# Patient Record
Sex: Male | Born: 1945
Health system: Southern US, Community
[De-identification: ages and names within clinical notes are randomized; demographics above are authoritative.]

## PROBLEM LIST (undated history)

## (undated) DIAGNOSIS — J45909 Unspecified asthma, uncomplicated: Secondary | ICD-10-CM

## (undated) DIAGNOSIS — Z972 Presence of dental prosthetic device (complete) (partial): Secondary | ICD-10-CM

## (undated) DIAGNOSIS — Z7901 Long term (current) use of anticoagulants: Secondary | ICD-10-CM

## (undated) DIAGNOSIS — I10 Essential (primary) hypertension: Secondary | ICD-10-CM

## (undated) DIAGNOSIS — K08109 Complete loss of teeth, unspecified cause, unspecified class: Secondary | ICD-10-CM

## (undated) DIAGNOSIS — H16231 Neurotrophic keratoconjunctivitis, right eye: Secondary | ICD-10-CM

## (undated) DIAGNOSIS — I2510 Atherosclerotic heart disease of native coronary artery without angina pectoris: Secondary | ICD-10-CM

## (undated) DIAGNOSIS — M5416 Radiculopathy, lumbar region: Secondary | ICD-10-CM

## (undated) DIAGNOSIS — Z973 Presence of spectacles and contact lenses: Secondary | ICD-10-CM

## (undated) DIAGNOSIS — G8929 Other chronic pain: Secondary | ICD-10-CM

## (undated) DIAGNOSIS — N182 Chronic kidney disease, stage 2 (mild): Secondary | ICD-10-CM

## (undated) DIAGNOSIS — H401113 Primary open-angle glaucoma, right eye, severe stage: Secondary | ICD-10-CM

## (undated) DIAGNOSIS — N400 Enlarged prostate without lower urinary tract symptoms: Secondary | ICD-10-CM

## (undated) DIAGNOSIS — J4489 Other specified chronic obstructive pulmonary disease: Secondary | ICD-10-CM

## (undated) DIAGNOSIS — J189 Pneumonia, unspecified organism: Secondary | ICD-10-CM

## (undated) DIAGNOSIS — C61 Malignant neoplasm of prostate: Secondary | ICD-10-CM

## (undated) DIAGNOSIS — M1990 Unspecified osteoarthritis, unspecified site: Secondary | ICD-10-CM

## (undated) DIAGNOSIS — I440 Atrioventricular block, first degree: Secondary | ICD-10-CM

## (undated) DIAGNOSIS — H40022 Open angle with borderline findings, high risk, left eye: Secondary | ICD-10-CM

## (undated) HISTORY — DX: Open angle with borderline findings, high risk, left eye: H40.022

## (undated) HISTORY — PX: EYE SURGERY: SHX253

## (undated) HISTORY — DX: Benign prostatic hyperplasia without lower urinary tract symptoms: N40.0

## (undated) HISTORY — PX: CATARACT EXTRACTION W/ INTRAOCULAR LENS  IMPLANT, BILATERAL: SHX1307

## (undated) HISTORY — PX: CORNEAL TRANSPLANT: SHX108

## (undated) HISTORY — DX: Primary open-angle glaucoma, right eye, severe stage: H40.1113

## (undated) HISTORY — DX: Radiculopathy, lumbar region: M54.16

---

## 1999-05-29 DIAGNOSIS — N400 Enlarged prostate without lower urinary tract symptoms: Secondary | ICD-10-CM

## 1999-05-29 HISTORY — DX: Benign prostatic hyperplasia without lower urinary tract symptoms: N40.0

## 2012-07-15 DIAGNOSIS — T868419 Corneal transplant failure, unspecified eye: Secondary | ICD-10-CM | POA: Insufficient documentation

## 2012-07-29 DIAGNOSIS — Z961 Presence of intraocular lens: Secondary | ICD-10-CM | POA: Insufficient documentation

## 2012-11-04 HISTORY — PX: CORNEAL TRANSPLANT: SHX108

## 2012-12-30 DIAGNOSIS — M5416 Radiculopathy, lumbar region: Secondary | ICD-10-CM | POA: Insufficient documentation

## 2013-02-10 DIAGNOSIS — H2640 Unspecified secondary cataract: Secondary | ICD-10-CM | POA: Insufficient documentation

## 2013-03-13 ENCOUNTER — Emergency Department (HOSPITAL_COMMUNITY)
Admission: EM | Admit: 2013-03-13 | Discharge: 2013-03-13 | Disposition: A | Discharge status: Home / Self Care | Payer: Self-pay | Attending: Emergency Medicine | Admitting: Emergency Medicine

## 2013-03-13 ENCOUNTER — Emergency Department (HOSPITAL_COMMUNITY): Payer: Self-pay

## 2013-03-13 ENCOUNTER — Encounter (HOSPITAL_COMMUNITY): Payer: Self-pay | Admitting: Emergency Medicine

## 2013-03-13 DIAGNOSIS — Z792 Long term (current) use of antibiotics: Secondary | ICD-10-CM | POA: Insufficient documentation

## 2013-03-13 DIAGNOSIS — IMO0002 Reserved for concepts with insufficient information to code with codable children: Secondary | ICD-10-CM | POA: Insufficient documentation

## 2013-03-13 DIAGNOSIS — Z79899 Other long term (current) drug therapy: Secondary | ICD-10-CM | POA: Insufficient documentation

## 2013-03-13 DIAGNOSIS — J441 Chronic obstructive pulmonary disease with (acute) exacerbation: Secondary | ICD-10-CM | POA: Insufficient documentation

## 2013-03-13 HISTORY — DX: Unspecified asthma, uncomplicated: J45.909

## 2013-03-13 MED ORDER — PREDNISONE 20 MG PO TABS: 60.0000 mg | ORAL_TABLET | Freq: Every day | ORAL | Status: DC

## 2013-03-13 MED ORDER — IPRATROPIUM BROMIDE 0.02 % IN SOLN
0.5000 mg | Freq: Once | RESPIRATORY_TRACT | Status: AC
  Administered 2013-03-13: 0.5 mg via RESPIRATORY_TRACT
  Filled 2013-03-13: qty 2.5

## 2013-03-13 MED ORDER — PREDNISONE 20 MG PO TABS
60.0000 mg | ORAL_TABLET | Freq: Once | ORAL | Status: AC
  Administered 2013-03-13: 60 mg via ORAL
  Filled 2013-03-13: qty 3

## 2013-03-13 MED ORDER — ALBUTEROL SULFATE (5 MG/ML) 0.5% IN NEBU
2.5000 mg | INHALATION_SOLUTION | Freq: Once | RESPIRATORY_TRACT | Status: AC
  Administered 2013-03-13: 2.5 mg via RESPIRATORY_TRACT
  Filled 2013-03-13: qty 0.5

## 2013-03-13 NOTE — ED Provider Notes (Signed)
CSN: [id number]     Arrival date & time 03/13/13  1413 History   First MD Initiated Contact with Patient 03/13/13 1502     Chief Complaint  Patient presents with  . Asthma   (Consider location/radiation/quality/duration/timing/severity/associated sxs/prior Treatment) HPI Comments: Patient here with a 2 day history of worsening shortness of breath and cough.  He denies fever or chills, chest pain.  States the shortness of breath is worse with exertion.  He denies any recent sick contacts and has not been out of the [place].  He is originally from [place].  He states that he has thick brown colored sputum without frank hemoptysis as well as wheezing.  He has tried his home ventolin inhaler without relief of symptoms.  Also denies lower extremity pain or edema.    Patient is a 67 y.o. male presenting with asthma. The history is provided by the patient and a relative. No language interpreter was used.  Asthma This is a new problem. The current episode started yesterday. The problem occurs constantly. The problem has been unchanged. Associated symptoms include coughing. Pertinent negatives include no abdominal pain, arthralgias, chest pain, chills, congestion, diaphoresis, fever, headaches, myalgias, neck pain, sore throat, swollen glands, vertigo, vomiting or weakness. The symptoms are aggravated by walking. He has tried nothing for the symptoms. The treatment provided no relief.    Past Medical History  Diagnosis Date  . Asthma    History reviewed. No pertinent past surgical history. No family history on file. History  Substance Use Topics  . Smoking status: Never Smoker   . Smokeless tobacco: Never Used  . Alcohol Use: No    Review of Systems  Constitutional: Negative for fever, chills and diaphoresis.  HENT: Negative for congestion and sore throat.   Respiratory: Positive for cough.   Cardiovascular: Negative for chest pain.  Gastrointestinal: Negative for vomiting and abdominal pain.   Musculoskeletal: Negative for arthralgias, myalgias and neck pain.  Neurological: Negative for vertigo, weakness and headaches.  All other systems reviewed and are negative.    Allergies  Review of patient's allergies indicates no known allergies.  Home Medications   Current Outpatient Rx  Name  Route  Sig  Dispense  Refill  . albuterol (PROVENTIL HFA) 108 (90 BASE) MCG/ACT inhaler   Inhalation   Inhale 2 puffs into the lungs every 6 (six) hours as needed for wheezing or shortness of breath.         Marland Kitchen amLODipine (NORVASC) 5 MG tablet   Oral   Take 5 mg by mouth daily.         Marland Kitchen amoxicillin (AMOXIL) 500 MG tablet   Oral   Take 500 mg by mouth daily.         . beclomethasone (QVAR) 80 MCG/ACT inhaler   Inhalation   Inhale 2 puffs into the lungs 2 (two) times daily.         . dorzolamide (TRUSOPT) 2 % ophthalmic solution   Right Eye   Place 1 drop into the right eye 3 (three) times daily.         Marland Kitchen doxazosin (CARDURA) 2 MG tablet   Oral   Take 2 mg by mouth at bedtime.         Marland Kitchen latanoprost (XALATAN) 0.005 % ophthalmic solution   Right Eye   Place 1 drop into the right eye at bedtime.         Marland Kitchen omeprazole (PRILOSEC) 20 MG capsule   Oral   Take  20 mg by mouth 2 (two) times daily.         . prednisoLONE acetate (PRED FORTE) 1 % ophthalmic suspension   Right Eye   Place 1 drop into the right eye 2 (two) times daily.         . timolol (BETIMOL) 0.5 % ophthalmic solution   Both Eyes   Place 1 drop into both eyes 2 (two) times daily.          BP 170/91  Pulse 72  Temp(Src) 98.5 F (36.9 C) (Oral)  Resp 18  SpO2 98% Physical Exam  Nursing note and vitals reviewed. Constitutional: He is oriented to person, place, and time. He appears well-developed and well-nourished. No distress.  HENT:  Head: Normocephalic and atraumatic.  Right Ear: External ear normal.  Left Ear: External ear normal.  Nose: Nose normal.  Mouth/Throat: Oropharynx is  clear and moist.  Eyes: Pupils are equal, round, and reactive to light. No scleral icterus.  Arcus senilis  Neck: Normal range of motion. Neck supple. No JVD present. No tracheal deviation present.  Cardiovascular: Normal rate, regular rhythm, normal heart sounds and intact distal pulses.  Exam reveals no gallop and no friction rub.   No murmur heard. Pulmonary/Chest: Effort normal. No respiratory distress. He has wheezes in the right middle field, the right lower field, the left middle field and the left lower field. He has no rales. He exhibits no tenderness.  Abdominal: Soft. Bowel sounds are normal. He exhibits no distension. There is no tenderness.  Musculoskeletal: Normal range of motion. He exhibits no edema and no tenderness.  Neurological: He is alert and oriented to person, place, and time. No cranial nerve deficit. He exhibits normal muscle tone. Coordination normal.  Skin: Skin is warm and dry. No rash noted. No erythema. No pallor.  Psychiatric: He has a normal mood and affect. His behavior is normal. Judgment and thought content normal.    ED Course  Procedures (including critical care time) Labs Review Labs Reviewed - No data to display Imaging Review No results found.  EKG Interpretation   None      No results found for this or any previous visit. Dg Chest 2 View  03/13/2013   CLINICAL DATA:  Wheezing and shortness of breath. History of asthma.  EXAM: CHEST  2 VIEW  COMPARISON:  None.  FINDINGS: The heart size and mediastinal contours are within normal limits. The lungs are hyperinflated but clear. No acute osseous abnormality.  IMPRESSION: Emphysema.  No acute abnormality.   Electronically Signed   By: Geanie Cooley M.D.   On: 03/13/2013 16:22    4:46 PM Patient reports marked improvement in shortness of breath after breathing treatment.  He states that he feels like he can return home.  Dr. Roselyn Bering in to see the patient.   MDM  COPD exacerbation  Patient here  with mild exacerbation of COPD.  Chest x-ray without acute infectious process noted.  Is already on Qvar and albuterol rescue inhalers.  Will continue on oral steroids for short course and patient to follow up with PCP.  He was given strict return precautions.    Izola Price Marisue Humble, PA-C 03/13/13 1652

## 2013-03-13 NOTE — ED Notes (Signed)
Patient states that he has been coughing up brown tinged sputum

## 2013-03-13 NOTE — ED Provider Notes (Signed)
Medical screening examination/treatment/procedure(s) were conducted as a shared visit with non-physician practitioner(s) and myself.  I personally evaluated the patient during the encounter  Patient presented to the emergency room with complaints of an asthma attack. He does have a known history of asthma and hand wheezing on exam. Patient improved after treatment with beta agonists and steroids.  On my exam he was no longer wheezing and was ready for discharge. Patient had no questions or concerns  Celene Kras, MD 03/13/13 [phone]

## 2013-03-13 NOTE — ED Notes (Signed)
Patient having symptoms of asthma attack. SOB and wheezing. Has taken ventolin with positive results for short amount of time.

## 2013-03-21 ENCOUNTER — Encounter (HOSPITAL_COMMUNITY): Payer: Self-pay | Admitting: Emergency Medicine

## 2013-03-21 ENCOUNTER — Other Ambulatory Visit: Payer: Self-pay

## 2013-03-21 ENCOUNTER — Emergency Department (HOSPITAL_COMMUNITY)
Admission: EM | Admit: 2013-03-21 | Discharge: 2013-03-21 | Disposition: A | Discharge status: Home / Self Care | Payer: Self-pay | Attending: Emergency Medicine | Admitting: Emergency Medicine

## 2013-03-21 ENCOUNTER — Emergency Department (HOSPITAL_COMMUNITY): Payer: Self-pay

## 2013-03-21 DIAGNOSIS — J45909 Unspecified asthma, uncomplicated: Secondary | ICD-10-CM

## 2013-03-21 DIAGNOSIS — J45901 Unspecified asthma with (acute) exacerbation: Secondary | ICD-10-CM | POA: Insufficient documentation

## 2013-03-21 DIAGNOSIS — Z792 Long term (current) use of antibiotics: Secondary | ICD-10-CM | POA: Insufficient documentation

## 2013-03-21 DIAGNOSIS — Z79899 Other long term (current) drug therapy: Secondary | ICD-10-CM | POA: Insufficient documentation

## 2013-03-21 LAB — CBC WITH DIFFERENTIAL/PLATELET
Basophils Absolute: 0 10*3/uL (ref 0.0–0.1)
Basophils Relative: 0 % (ref 0–1)
Eosinophils Absolute: 0.1 10*3/uL (ref 0.0–0.7)
Eosinophils Relative: 2 % (ref 0–5)
HCT: 44.1 % (ref 39.0–52.0)
Hemoglobin: 14.7 g/dL (ref 13.0–17.0)
Lymphocytes Relative: 22 % (ref 12–46)
Lymphs Abs: 1.5 10*3/uL (ref 0.7–4.0)
MCH: 28.7 pg (ref 26.0–34.0)
MCHC: 33.3 g/dL (ref 30.0–36.0)
MCV: 86 fL (ref 78.0–100.0)
Monocytes Absolute: 0.6 10*3/uL (ref 0.1–1.0)
Monocytes Relative: 8 % (ref 3–12)
Neutro Abs: 4.8 10*3/uL (ref 1.7–7.7)
Neutrophils Relative %: 68 % (ref 43–77)
Platelets: 235 10*3/uL (ref 150–400)
RBC: 5.13 MIL/uL (ref 4.22–5.81)
RDW: 13.1 % (ref 11.5–15.5)
WBC: 7.1 10*3/uL (ref 4.0–10.5)

## 2013-03-21 LAB — D-DIMER, QUANTITATIVE (NOT AT ARMC): D-Dimer, Quant: <0.27 ug/mL-FEU (ref 0.00–0.48)

## 2013-03-21 LAB — BASIC METABOLIC PANEL
BUN: 21 mg/dL (ref 6–23)
CO2: 29 mEq/L (ref 19–32)
Calcium: 8.9 mg/dL (ref 8.4–10.5)
Chloride: 96 mEq/L (ref 96–112)
Creatinine, Ser: 1.12 mg/dL (ref 0.50–1.35)
GFR calc Af Amer: 76 mL/min — ABNORMAL LOW (ref >90)
GFR calc non Af Amer: 65 mL/min — ABNORMAL LOW (ref >90)
Glucose, Bld: 118 mg/dL — ABNORMAL HIGH (ref 70–99)
Potassium: 3.7 mEq/L (ref 3.5–5.1)
Sodium: 133 mEq/L — ABNORMAL LOW (ref 135–145)

## 2013-03-21 LAB — TROPONIN I: Troponin I: <0.3 ng/mL (ref <0.30)

## 2013-03-21 MED ORDER — SODIUM CHLORIDE 0.9 % IV SOLN
INTRAVENOUS | Status: DC
  Administered 2013-03-21 (×2): via INTRAVENOUS

## 2013-03-21 MED ORDER — PREDNISONE 10 MG PO TABS: 20.0000 mg | ORAL_TABLET | Freq: Every day | ORAL | Status: DC

## 2013-03-21 MED ORDER — IPRATROPIUM BROMIDE 0.02 % IN SOLN
RESPIRATORY_TRACT | Status: AC
  Filled 2013-03-21: qty 2.5

## 2013-03-21 MED ORDER — AZITHROMYCIN 250 MG PO TABS: ORAL_TABLET | ORAL | Status: DC

## 2013-03-21 MED ORDER — METHYLPREDNISOLONE SODIUM SUCC 125 MG IJ SOLR
125.0000 mg | Freq: Once | INTRAMUSCULAR | Status: AC
  Administered 2013-03-21: 125 mg via INTRAVENOUS
  Filled 2013-03-21: qty 2

## 2013-03-21 MED ORDER — IPRATROPIUM BROMIDE 0.02 % IN SOLN
0.5000 mg | Freq: Once | RESPIRATORY_TRACT | Status: AC
  Administered 2013-03-21: 0.5 mg via RESPIRATORY_TRACT
  Filled 2013-03-21: qty 2.5

## 2013-03-21 MED ORDER — ALBUTEROL SULFATE (5 MG/ML) 0.5% IN NEBU
5.0000 mg | INHALATION_SOLUTION | Freq: Once | RESPIRATORY_TRACT | Status: AC
  Administered 2013-03-21: 5 mg via RESPIRATORY_TRACT
  Filled 2013-03-21: qty 1

## 2013-03-21 NOTE — ED Notes (Addendum)
Pt here for c/o sob x1 wk hx of asthma sob unrelieived by meds such inhalers Prednisone and Mucinex given on Oct 17 pox 100% no distress noted pt was seen here on Oct 17 for same

## 2013-03-21 NOTE — ED Provider Notes (Signed)
CSN: [id number]     Arrival date & time 03/21/13  1509 History   First MD Initiated Contact with Patient 03/21/13 1536     No chief complaint on file.  (Consider location/radiation/quality/duration/timing/severity/associated sxs/prior Treatment) The history is provided by the patient and a relative.   Plain or shortness of breath x3 days. Seen here recently and diagnosed with bronchitis and placed on prednisone and given albuterol. He does have history of asthma. Notes worsening productive cough of brown sputum. Denies any fever. Denies any anginal type chest pain. No vomiting or diarrhea. No orthopnea. Some dyspnea on exertion. Denies any leg pain or swelling. Symptoms are worse at night and no state he has been compliant with his medications. Past Medical History  Diagnosis Date  . Asthma    No past surgical history on file. No family history on file. History  Substance Use Topics  . Smoking status: Never Smoker   . Smokeless tobacco: Never Used  . Alcohol Use: No    Review of Systems  All other systems reviewed and are negative.    Allergies  Review of patient's allergies indicates no known allergies.  Home Medications   Current Outpatient Rx  Name  Route  Sig  Dispense  Refill  . albuterol (PROVENTIL HFA) 108 (90 BASE) MCG/ACT inhaler   Inhalation   Inhale 2 puffs into the lungs every 6 (six) hours as needed for wheezing or shortness of breath.         Marland Kitchen amLODipine (NORVASC) 5 MG tablet   Oral   Take 5 mg by mouth daily.         . beclomethasone (QVAR) 80 MCG/ACT inhaler   Inhalation   Inhale 2 puffs into the lungs 2 (two) times daily.         . dorzolamide (TRUSOPT) 2 % ophthalmic solution   Right Eye   Place 1 drop into the right eye 3 (three) times daily.         Marland Kitchen doxazosin (CARDURA) 2 MG tablet   Oral   Take 2 mg by mouth at bedtime.         Marland Kitchen latanoprost (XALATAN) 0.005 % ophthalmic solution   Right Eye   Place 1 drop into the right eye at  bedtime.         Marland Kitchen omeprazole (PRILOSEC) 20 MG capsule   Oral   Take 20 mg by mouth 2 (two) times daily.         . prednisoLONE acetate (PRED FORTE) 1 % ophthalmic suspension   Right Eye   Place 1 drop into the right eye 2 (two) times daily.         . timolol (BETIMOL) 0.5 % ophthalmic solution   Both Eyes   Place 1 drop into both eyes 2 (two) times daily.         Marland Kitchen amoxicillin (AMOXIL) 500 MG tablet   Oral   Take 500 mg by mouth daily.          BP 149/82  Pulse 79  Temp(Src) 98.5 F (36.9 C) (Oral)  Resp 16  SpO2 100% Physical Exam  Nursing note and vitals reviewed. Constitutional: He is oriented to person, place, and time. He appears well-developed and well-nourished.  Non-toxic appearance. No distress.  HENT:  Head: Normocephalic and atraumatic.  Eyes: Conjunctivae, EOM and lids are normal. Pupils are equal, round, and reactive to light.  Neck: Normal range of motion. Neck supple. No tracheal deviation present. No mass  present.  Cardiovascular: Normal rate, regular rhythm and normal heart sounds.  Exam reveals no gallop.   No murmur heard. Pulmonary/Chest: Effort normal. No stridor. No respiratory distress. He has decreased breath sounds. He has wheezes. He has no rhonchi. He has no rales.  Abdominal: Soft. Normal appearance and bowel sounds are normal. He exhibits no distension. There is no tenderness. There is no rebound and no CVA tenderness.  Musculoskeletal: Normal range of motion. He exhibits no edema and no tenderness.  Neurological: He is alert and oriented to person, place, and time. He has normal strength. No cranial nerve deficit or sensory deficit. GCS eye subscore is 4. GCS verbal subscore is 5. GCS motor subscore is 6.  Skin: Skin is warm and dry. No abrasion and no rash noted.  Psychiatric: He has a normal mood and affect. His speech is normal and behavior is normal.    ED Course  Procedures (including critical care time) Labs Review Labs  Reviewed - No data to display Imaging Review No results found.  EKG Interpretation   None       MDM  No diagnosis found.  Date: 03/21/2013  Rate: 77  Rhythm: normal sinus rhythm  QRS Axis: normal  Intervals: normal  ST/T Wave abnormalities: nonspecific T wave changes  Conduction Disutrbances:left anterior fascicular block  Narrative Interpretation:   Old EKG Reviewed: none available  5:59 PM Patient given albuterol & Medrol here feels better. No concern for ACS or pulmonary embolism. Suspect that he likely has a asthma exacerbation we'll treat accordingly    Toy Baker, MD 03/21/13 1801

## 2013-05-26 ENCOUNTER — Emergency Department (HOSPITAL_COMMUNITY): Payer: Self-pay

## 2013-05-26 ENCOUNTER — Encounter (HOSPITAL_COMMUNITY): Payer: Self-pay | Admitting: Emergency Medicine

## 2013-05-26 ENCOUNTER — Emergency Department (HOSPITAL_COMMUNITY)
Admission: EM | Admit: 2013-05-26 | Discharge: 2013-05-26 | Disposition: A | Discharge status: Home / Self Care | Payer: Self-pay | Attending: Emergency Medicine | Admitting: Emergency Medicine

## 2013-05-26 DIAGNOSIS — J45901 Unspecified asthma with (acute) exacerbation: Secondary | ICD-10-CM | POA: Insufficient documentation

## 2013-05-26 DIAGNOSIS — IMO0002 Reserved for concepts with insufficient information to code with codable children: Secondary | ICD-10-CM | POA: Insufficient documentation

## 2013-05-26 DIAGNOSIS — R05 Cough: Secondary | ICD-10-CM

## 2013-05-26 DIAGNOSIS — Z79899 Other long term (current) drug therapy: Secondary | ICD-10-CM | POA: Insufficient documentation

## 2013-05-26 DIAGNOSIS — R059 Cough, unspecified: Secondary | ICD-10-CM

## 2013-05-26 LAB — BASIC METABOLIC PANEL
BUN: 14 mg/dL (ref 6–23)
CO2: 27 mEq/L (ref 19–32)
Calcium: 9.2 mg/dL (ref 8.4–10.5)
Chloride: 100 mEq/L (ref 96–112)
Creatinine, Ser: 1 mg/dL (ref 0.50–1.35)
GFR calc Af Amer: 87 mL/min — ABNORMAL LOW (ref >90)
GFR calc non Af Amer: 75 mL/min — ABNORMAL LOW (ref >90)
Glucose, Bld: 93 mg/dL (ref 70–99)
Potassium: 4.1 mEq/L (ref 3.7–5.3)
Sodium: 138 mEq/L (ref 137–147)

## 2013-05-26 LAB — CBC
HCT: 43.8 % (ref 39.0–52.0)
Hemoglobin: 14.6 g/dL (ref 13.0–17.0)
MCH: 28.2 pg (ref 26.0–34.0)
MCHC: 33.3 g/dL (ref 30.0–36.0)
MCV: 84.7 fL (ref 78.0–100.0)
Platelets: 214 10*3/uL (ref 150–400)
RBC: 5.17 MIL/uL (ref 4.22–5.81)
RDW: 12.9 % (ref 11.5–15.5)
WBC: 7.7 10*3/uL (ref 4.0–10.5)

## 2013-05-26 LAB — POCT I-STAT TROPONIN I: Troponin i, poc: 0 ng/mL (ref 0.00–0.08)

## 2013-05-26 LAB — PRO B NATRIURETIC PEPTIDE: Pro B Natriuretic peptide (BNP): 31.8 pg/mL (ref 0–125)

## 2013-05-26 MED ORDER — ALBUTEROL SULFATE (2.5 MG/3ML) 0.083% IN NEBU
5.0000 mg | INHALATION_SOLUTION | Freq: Once | RESPIRATORY_TRACT | Status: AC
  Administered 2013-05-26: 5 mg via RESPIRATORY_TRACT
  Filled 2013-05-26: qty 6

## 2013-05-26 MED ORDER — PREDNISONE 20 MG PO TABS: 40.0000 mg | ORAL_TABLET | Freq: Every day | ORAL | Status: DC

## 2013-05-26 MED ORDER — METHYLPREDNISOLONE SODIUM SUCC 125 MG IJ SOLR
125.0000 mg | Freq: Once | INTRAMUSCULAR | Status: AC
  Administered 2013-05-26: 125 mg via INTRAVENOUS
  Filled 2013-05-26: qty 2

## 2013-05-26 MED ORDER — IPRATROPIUM BROMIDE 0.02 % IN SOLN
0.5000 mg | Freq: Once | RESPIRATORY_TRACT | Status: AC
  Administered 2013-05-26: 0.5 mg via RESPIRATORY_TRACT
  Filled 2013-05-26: qty 2.5

## 2013-05-26 NOTE — ED Provider Notes (Signed)
CSN: [id number]     Arrival date & time 05/26/13  1522 History   First MD Initiated Contact with Patient 05/26/13 1717     Chief Complaint  Patient presents with  . Cough  . Asthma   (Consider location/radiation/quality/duration/timing/severity/associated sxs/prior Treatment) HPI Comments: Patient with h/o asthma -- presents with c/o worsening cough, SOB, and chest tightness since this morning. No fever, however patient began having chills upon arrival to the emergency department. Cough is nonproductive. He has had sore throat but denies other upper respiratory tract infection symptoms. No nausea, vomiting, diarrhea. No lower extremity swelling. Patient has been using his albuterol inhaler at home with some relief. Onset of symptoms gradual. Course is constant. Nothing makes symptoms worse.  Patient is a 67 y.o. male presenting with cough and asthma. The history is provided by the patient and a relative.  Cough Associated symptoms: chills and wheezing   Associated symptoms: no chest pain, no fever, no headaches, no myalgias, no rash, no rhinorrhea, no shortness of breath and no sore throat   Asthma Associated symptoms include chills and coughing. Pertinent negatives include no abdominal pain, chest pain, fever, headaches, myalgias, nausea, rash, sore throat or vomiting.    Past Medical History  Diagnosis Date  . Asthma    No past surgical history on file. No family history on file. History  Substance Use Topics  . Smoking status: Never Smoker   . Smokeless tobacco: Never Used  . Alcohol Use: No    Review of Systems  Constitutional: Positive for chills. Negative for fever.  HENT: Negative for rhinorrhea and sore throat.   Eyes: Negative for redness.  Respiratory: Positive for cough, chest tightness and wheezing. Negative for shortness of breath.   Cardiovascular: Negative for chest pain and leg swelling.  Gastrointestinal: Negative for nausea, vomiting, abdominal pain and  diarrhea.  Genitourinary: Negative for dysuria.  Musculoskeletal: Negative for myalgias.  Skin: Negative for rash.  Neurological: Negative for headaches.    Allergies  Review of patient's allergies indicates no known allergies.  Home Medications   Current Outpatient Rx  Name  Route  Sig  Dispense  Refill  . albuterol (PROVENTIL HFA) 108 (90 BASE) MCG/ACT inhaler   Inhalation   Inhale 2 puffs into the lungs every 6 (six) hours as needed for wheezing or shortness of breath.         Marland Kitchen amLODipine (NORVASC) 5 MG tablet   Oral   Take 5 mg by mouth daily.         . beclomethasone (QVAR) 80 MCG/ACT inhaler   Inhalation   Inhale 2 puffs into the lungs 2 (two) times daily.         . dorzolamide (TRUSOPT) 2 % ophthalmic solution   Right Eye   Place 1 drop into the right eye 3 (three) times daily.         Marland Kitchen latanoprost (XALATAN) 0.005 % ophthalmic solution   Right Eye   Place 1 drop into the right eye at bedtime.         . prednisoLONE acetate (PRED FORTE) 1 % ophthalmic suspension   Right Eye   Place 1 drop into the right eye 2 (two) times daily.         . timolol (BETIMOL) 0.5 % ophthalmic solution   Both Eyes   Place 1 drop into both eyes 2 (two) times daily.          BP 144/82  Pulse 98  Temp(Src) 98.4 F (  36.9 C) (Oral)  Resp 18  SpO2 100% Physical Exam  Nursing note and vitals reviewed. Constitutional: He appears well-developed and well-nourished.  HENT:  Head: Normocephalic and atraumatic.  Right Ear: Tympanic membrane, external ear and ear canal normal.  Left Ear: Tympanic membrane, external ear and ear canal normal.  Nose: Nose normal. No mucosal edema or rhinorrhea.  Mouth/Throat: Uvula is midline, oropharynx is clear and moist and mucous membranes are normal.  Eyes: Conjunctivae are normal. Right eye exhibits no discharge. Left eye exhibits no discharge.  Neck: Normal range of motion. Neck supple.  Cardiovascular: Normal rate, regular rhythm  and normal heart sounds.   Pulmonary/Chest: Effort normal and breath sounds normal.  Abdominal: Soft. There is no tenderness.  Neurological: He is alert.  Skin: Skin is warm and dry.  Psychiatric: He has a normal mood and affect.    ED Course  Procedures (including critical care time) Labs Review Labs Reviewed  BASIC METABOLIC PANEL - Abnormal; Notable for the following:    GFR calc non Af Amer 75 (*)    GFR calc Af Amer 87 (*)    All other components within normal limits  CBC  PRO B NATRIURETIC PEPTIDE  POCT I-STAT TROPONIN I   Imaging Review Dg Chest 2 View  05/26/2013   CLINICAL DATA:  Short of breath, cough  EXAM: CHEST  2 VIEW  COMPARISON:  DG CHEST 2 VIEW dated 03/21/2013  FINDINGS: Lungs are hyperinflated. No effusion, infiltrate, or pneumothorax. Degenerative osteophytosis of the thoracic spine.  IMPRESSION: No acute cardiopulmonary process.   Electronically Signed   By: Genevive Bi M.D.   On: 05/26/2013 17:59    EKG Interpretation    Date/Time:  Tuesday May 26 2013 15:57:46 EST Ventricular Rate:  97 PR Interval:  185 QRS Duration: 87 QT Interval:  337 QTC Calculation: 428 R Axis:   -13 Text Interpretation:  Sinus rhythm Nonspecific T abnormalities, lateral leads Baseline wander in lead(s) II III aVF V2 Sinus rhythm T wave abnormality Abnormal ekg Confirmed by Gerhard Munch  MD [phone]) on 05/26/2013 5:20:47 PM           5:44 PM Patient seen and examined. Work-up initiated. Medications ordered. Previous records reviewed.   Vital signs reviewed and are as follows: Filed Vitals:   05/26/13 1544  BP: 144/82  Pulse: 98  Temp: 98.4 F (36.9 C)  Resp: 18   Pt states that the IV steroids worked better last time so these were ordered. Afebrile, but given chills, CXR to r/o PNA. Patient appears very well.   Pt d/w and seen by Dr. Jeraldine Loots.   Pt feels better after breathing treatment. Informed of CXR results.   Plan: d/c with prednisone, use home  albuterol  Patient urged to return with worsening symptoms or other concerns. Patient verbalized understanding and agrees with plan.   MDM   1. Cough    Patient with asthma, cough. CXR neg. Vitals nml. Pt appears well. He has albuterol at home. Short course steroids given. He appears well. No concern for PE.    Renne Crigler, PA-C 05/27/13 [phone]

## 2013-05-26 NOTE — ED Notes (Signed)
Pt c/o cough, SOB since this morning.  Hx of asthma.

## 2013-05-30 NOTE — ED Provider Notes (Signed)
  This was a shared visit with a mid-level provided (NP or PA).  Throughout the patient's course I was available for consultation/collaboration.  I saw the ECG (if appropriate), relevant labs and studies - I agree with the interpretation.  On my exam the patient was in no distress. He improved substantially, was appropriate for discharge with outpatient followup.      Carmin Muskrat, MD 05/30/13 [phone]

## 2014-08-24 DIAGNOSIS — H40022 Open angle with borderline findings, high risk, left eye: Secondary | ICD-10-CM | POA: Insufficient documentation

## 2014-09-05 ENCOUNTER — Encounter (HOSPITAL_COMMUNITY): Payer: Self-pay | Admitting: *Deleted

## 2014-09-05 ENCOUNTER — Emergency Department (HOSPITAL_COMMUNITY): Payer: Self-pay

## 2014-09-05 ENCOUNTER — Emergency Department (HOSPITAL_COMMUNITY)
Admission: EM | Admit: 2014-09-05 | Discharge: 2014-09-05 | Disposition: A | Discharge status: Home / Self Care | Payer: Self-pay | Attending: Emergency Medicine | Admitting: Emergency Medicine

## 2014-09-05 DIAGNOSIS — Z7952 Long term (current) use of systemic steroids: Secondary | ICD-10-CM | POA: Insufficient documentation

## 2014-09-05 DIAGNOSIS — J4521 Mild intermittent asthma with (acute) exacerbation: Secondary | ICD-10-CM | POA: Insufficient documentation

## 2014-09-05 DIAGNOSIS — Z79899 Other long term (current) drug therapy: Secondary | ICD-10-CM | POA: Insufficient documentation

## 2014-09-05 LAB — CBC
HCT: 42.4 % (ref 39.0–52.0)
Hemoglobin: 13.7 g/dL (ref 13.0–17.0)
MCH: 28 pg (ref 26.0–34.0)
MCHC: 32.3 g/dL (ref 30.0–36.0)
MCV: 86.5 fL (ref 78.0–100.0)
Platelets: 228 10*3/uL (ref 150–400)
RBC: 4.9 MIL/uL (ref 4.22–5.81)
RDW: 12.7 % (ref 11.5–15.5)
WBC: 4.9 10*3/uL (ref 4.0–10.5)

## 2014-09-05 LAB — BASIC METABOLIC PANEL
Anion gap: 10 (ref 5–15)
BUN: 12 mg/dL (ref 6–23)
CO2: 29 mmol/L (ref 19–32)
Calcium: 9.1 mg/dL (ref 8.4–10.5)
Chloride: 106 mmol/L (ref 96–112)
Creatinine, Ser: 0.94 mg/dL (ref 0.50–1.35)
GFR calc Af Amer: >90 mL/min (ref >90)
GFR calc non Af Amer: 82 mL/min — ABNORMAL LOW (ref >90)
Glucose, Bld: 100 mg/dL — ABNORMAL HIGH (ref 70–99)
Potassium: 4.2 mmol/L (ref 3.5–5.1)
Sodium: 145 mmol/L (ref 135–145)

## 2014-09-05 LAB — I-STAT TROPONIN, ED: Troponin i, poc: 0 ng/mL (ref 0.00–0.08)

## 2014-09-05 LAB — BRAIN NATRIURETIC PEPTIDE: B Natriuretic Peptide: 31.2 pg/mL (ref 0.0–100.0)

## 2014-09-05 MED ORDER — AEROCHAMBER PLUS W/MASK MISC
Status: DC
Start: 2014-09-05 — End: 2014-09-14

## 2014-09-05 MED ORDER — ALBUTEROL SULFATE HFA 108 (90 BASE) MCG/ACT IN AERS: 2.0000 | INHALATION_SPRAY | RESPIRATORY_TRACT | Status: DC | PRN

## 2014-09-05 MED ORDER — PREDNISONE 20 MG PO TABS: ORAL_TABLET | ORAL | Status: DC

## 2014-09-05 MED ORDER — IPRATROPIUM BROMIDE HFA 17 MCG/ACT IN AERS: 2.0000 | INHALATION_SPRAY | Freq: Three times a day (TID) | RESPIRATORY_TRACT | Status: DC

## 2014-09-05 MED ORDER — IPRATROPIUM-ALBUTEROL 0.5-2.5 (3) MG/3ML IN SOLN
3.0000 mL | Freq: Once | RESPIRATORY_TRACT | Status: AC
  Administered 2014-09-05: 3 mL via RESPIRATORY_TRACT
  Filled 2014-09-05: qty 3

## 2014-09-05 NOTE — ED Notes (Signed)
Pt speaks limited English, pt and family reported chest pain x3 days pain 8/10, hx of asthma, has tried OTC medicines with no relief. Denies n/v.

## 2014-09-05 NOTE — Discharge Instructions (Signed)
Chronic Asthmatic Bronchitis Chronic asthmatic bronchitis is a complication of persistent asthma. After a period of time with asthma, some people develop airflow obstruction that is present all the time, even when not having an asthma attack.There is also persistent inflammation of the airways, and the bronchial tubes produce more mucus. Chronic asthmatic bronchitis usually is a permanent problem with the lungs. CAUSES  Chronic asthmatic bronchitis happens most often in people who have asthma and also smoke cigarettes. Occasionally, it can happen to a person with long-standing or severe asthma even if the person is not a smoker. SIGNS AND SYMPTOMS  Chronic asthmatic bronchitis usually causes symptoms of both asthma and chronic bronchitis, including:   Coughing.  Increased sputum production.  Wheezing and shortness of breath.  Chest discomfort.  Recurring infections. DIAGNOSIS  Your health care provider will take a medical history and perform a physical exam. Chronic asthmatic bronchitis is suspected when a person with asthma has abnormal results on breathing tests (pulmonary function tests) even when breathing symptoms are at their best. Other tests, such as a chest X-ray, may be performed to rule out other conditions.  TREATMENT  Treatment involves controlling symptoms with medicine and lifestyle changes.  Your health care provider may prescribe asthma medicines, including inhaler and nebulizer medicines.  Infection can be treated with medicine to kill germs (antibiotics). Serious infections may require hospitalization. These can include:  Pneumonia.  Sinus infections.  Acute bronchitis.   Preventing infection and hospitalization is very important. Get an influenza vaccination every year as directed by your health care provider. Ask your health care provider whether you need a pneumonia vaccine.  Ask your health care provider whether you would benefit from a pulmonary  rehabilitation program. HOME CARE INSTRUCTIONS  Take medicines only as directed by your health care provider.  If you are a cigarette smoker, the most important thing that you can do is quit. Talk to your health care provider for help with quitting smoking.  Avoid pollen, dust, animal dander, molds, smoke, and other things that cause attacks.  Regular exercise is very important to help you feel better. Discuss possible exercise routines with your health care provider.  If animal dander is the cause of asthma, you may not be able to keep pets.  It is important that you:  Become educated about your medical condition.  Participate in maintaining wellness.  Seek medical care as directed. Delay in seeking medical care could cause permanent injury and may be a risk to your life. SEEK MEDICAL CARE IF:  You have wheezing and shortness of breath even if taking medicine to prevent attacks.  You have muscle aches, chest pain, or thickening of sputum.  Your sputum changes from clear or white to yellow, green, gray, or bloody. SEEK IMMEDIATE MEDICAL CARE IF:  Your usual medicines do not stop your wheezing.  You have increased coughing or shortness of breath or both.  You have increased difficulty breathing.  You have any problems from the medicine you are taking, such as a rash, itching, swelling, or trouble breathing. MAKE SURE YOU:   Understand these instructions.  Will watch your condition.  Will get help right away if you are not doing well or get worse. Document Released: 03/01/2006 Document Revised: 09/28/2013 Document Reviewed: 06/22/2013 [employer] Patient Information 2015 [employer]. This information is not intended to replace advice given to you by your health care provider. Make sure you discuss any questions you have with your health care provider.  

## 2014-09-05 NOTE — ED Provider Notes (Signed)
CSN: [id number]     Arrival date & time 09/05/14  1453 History   First MD Initiated Contact with Patient 09/05/14 1534     Chief Complaint  Patient presents with  . Chest Pain  . Asthma     (Consider location/radiation/quality/duration/timing/severity/associated sxs/prior Treatment) HPI The patient developed cough approximately 4 days ago. At onset he had some sore throat and then developed cough and wheezing. He reports that his inhaler does help somewhat however he is continuing to get increasing shortness of breath and tightness of his chest with breathing. He reports clear sputum production and no fever. The patient had similar symptoms about a year ago with an episode of bronchitis. He has not been having any lower extremity swelling or pain. He has no cardiac history. The patient has a very distant smoking history of only a few years. The patient ports he has a long-standing history of asthma. Past Medical History  Diagnosis Date  . Asthma    History reviewed. No pertinent past surgical history. History reviewed. No pertinent family history. History  Substance Use Topics  . Smoking status: Never Smoker   . Smokeless tobacco: Never Used  . Alcohol Use: No    Review of Systems  10 Systems reviewed and are negative for acute change except as noted in the HPI.   Allergies  Review of patient's allergies indicates no known allergies.  Home Medications   Prior to Admission medications   Medication Sig Start Date End Date Taking? Authorizing Provider  albuterol (PROVENTIL HFA) 108 (90 BASE) MCG/ACT inhaler Inhale 2 puffs into the lungs every 6 (six) hours as needed for wheezing or shortness of breath (wheezing).    Yes Historical Provider, MD  amLODipine (NORVASC) 5 MG tablet Take 5 mg by mouth daily.   Yes Historical Provider, MD  dorzolamide (TRUSOPT) 2 % ophthalmic solution Place 1 drop into the right eye 3 (three) times daily.   Yes Historical Provider, MD  fexofenadine  (ALLEGRA) 30 MG tablet Take 30 mg by mouth daily as needed (allergies).   Yes Historical Provider, MD  latanoprost (XALATAN) 0.005 % ophthalmic solution Place 1 drop into the right eye at bedtime.   Yes Historical Provider, MD  prednisoLONE acetate (PRED FORTE) 1 % ophthalmic suspension Place 1 drop into the right eye 2 (two) times daily.   Yes Historical Provider, MD  timolol (BETIMOL) 0.5 % ophthalmic solution Place 1 drop into both eyes 2 (two) times daily.   Yes Historical Provider, MD  albuterol (PROVENTIL HFA;VENTOLIN HFA) 108 (90 BASE) MCG/ACT inhaler Inhale 2 puffs into the lungs every 2 (two) hours as needed for wheezing or shortness of breath (cough). 09/05/14   Charlesetta Shanks, MD  ipratropium (ATROVENT HFA) 17 MCG/ACT inhaler Inhale 2 puffs into the lungs 3 (three) times daily. 09/05/14   Charlesetta Shanks, MD  predniSONE (DELTASONE) 20 MG tablet Take 2 tablets (40 mg total) by mouth daily. Patient not taking: Reported on 09/05/2014 05/26/13   Carlisle Cater, PA-C  predniSONE (DELTASONE) 20 MG tablet 3 tabs po day one, then 2 tabs daily x 4 days 09/05/14   Charlesetta Shanks, MD  Spacer/Aero-Holding Chambers (AEROCHAMBER PLUS WITH MASK) inhaler Use as instructed 09/05/14   Charlesetta Shanks, MD   BP 129/68 mmHg  Pulse 74  Temp(Src) 98.1 F (36.7 C) (Oral)  Resp 16  SpO2 100% Physical Exam  Constitutional: He is oriented to person, place, and time. He appears well-developed and well-nourished.  HENT:  Head: Normocephalic and atraumatic.  Nose: Nose normal.  Mouth/Throat: Oropharynx is clear and moist.  Eyes: EOM are normal. Pupils are equal, round, and reactive to light.  Neck: Neck supple.  Cardiovascular: Normal rate, regular rhythm, normal heart sounds and intact distal pulses.   Pulmonary/Chest: Effort normal. No respiratory distress. He has wheezes.  Patient has decreased breath sounds from the mid lung fields to the bases. There are fine expiratory wheeze. He has intermittent cough  particularly with deep inspiration.  Abdominal: Soft. Bowel sounds are normal. He exhibits no distension. There is no tenderness.  Musculoskeletal: Normal range of motion. He exhibits no edema.  Neurological: He is alert and oriented to person, place, and time. He has normal strength. Coordination normal. GCS eye subscore is 4. GCS verbal subscore is 5. GCS motor subscore is 6.  Skin: Skin is warm, dry and intact.  Psychiatric: He has a normal mood and affect.    ED Course  Procedures (including critical care time) Labs Review Labs Reviewed  BASIC METABOLIC PANEL - Abnormal; Notable for the following:    Glucose, Bld 100 (*)    GFR calc non Af Amer 82 (*)    All other components within normal limits  CBC  BRAIN NATRIURETIC PEPTIDE  I-STAT TROPOININ, ED    Imaging Review Dg Chest 2 View  09/05/2014   CLINICAL DATA:  Chest pain and cough for 3 days, history asthma  EXAM: CHEST  2 VIEW  COMPARISON:  This exam was interpreted during a PACS downtime with limited availability of comparison cases. It has been flagged for review following the downtime. If clinically indicated after this review, an addendum will be issued providing details about comparison to prior imaging.  FINDINGS: Normal heart size, mediastinal contours, and pulmonary vascularity.  Lungs hyperinflated but clear.  No pleural effusion or pneumothorax.  Probable small eventration at LEFT diaphragm anteriorly with bowel beneath.  No acute osseous findings.  IMPRESSION: Question emphysematous changes.  Probable small eventration at LEFT diaphragm.   Electronically Signed   By: Lavonia Dana M.D.   On: 09/05/2014 16:18     EKG Interpretation   Date/Time:  Sunday September 05 2014 14:57:01 EDT Ventricular Rate:  75 PR Interval:  189 QRS Duration: 90 QT Interval:  383 QTC Calculation: 428 R Axis:   -32 Text Interpretation:  Sinus rhythm Atrial premature complex Left axis  deviation Baseline wander in lead(s) V3 no STEMI no change  from old.  Confirmed by Johnney Killian, MD, Jeannie Done [phone]) on 09/05/2014 3:42:17 PM     Recheck post-nebulizer treatment. The patient felt much improved and that the air was moving and chest was not tight. Auscultation showed much increased aeration to the bases with fine expiratory wheeze present. MDM   Final diagnoses:  Asthmatic bronchitis, mild intermittent, with acute exacerbation   Patient has a history of asthma and episodes of bronchitis. At this time symptoms are most consistent with this starting with some sore throat and cough. The patient does not have cardiac history. At this point in time EKG is unchanged from prior and cardiac enzymes are negative. The patient got significant relief of symptoms with administration of DuoNeb. He will be put on a short course of prednisone and continue home inhalers with family physician follow-up. He is counseled to return should he have any worsening or changing or new symptoms.    Charlesetta Shanks, MD 09/05/14 [phone]

## 2014-09-05 NOTE — ED Notes (Signed)
Unsuccessful lab draw. RN made aware. 

## 2014-09-11 ENCOUNTER — Emergency Department (HOSPITAL_COMMUNITY): Payer: Self-pay

## 2014-09-11 ENCOUNTER — Emergency Department (HOSPITAL_COMMUNITY)
Admission: EM | Admit: 2014-09-11 | Discharge: 2014-09-11 | Disposition: A | Discharge status: Home / Self Care | Payer: Self-pay | Attending: Emergency Medicine | Admitting: Emergency Medicine

## 2014-09-11 ENCOUNTER — Encounter (HOSPITAL_COMMUNITY): Payer: Self-pay | Admitting: Emergency Medicine

## 2014-09-11 DIAGNOSIS — R05 Cough: Secondary | ICD-10-CM

## 2014-09-11 DIAGNOSIS — J4521 Mild intermittent asthma with (acute) exacerbation: Secondary | ICD-10-CM | POA: Insufficient documentation

## 2014-09-11 DIAGNOSIS — R059 Cough, unspecified: Secondary | ICD-10-CM

## 2014-09-11 DIAGNOSIS — J4520 Mild intermittent asthma, uncomplicated: Secondary | ICD-10-CM

## 2014-09-11 DIAGNOSIS — Z79899 Other long term (current) drug therapy: Secondary | ICD-10-CM | POA: Insufficient documentation

## 2014-09-11 DIAGNOSIS — I10 Essential (primary) hypertension: Secondary | ICD-10-CM | POA: Insufficient documentation

## 2014-09-11 HISTORY — DX: Essential (primary) hypertension: I10

## 2014-09-11 LAB — CBC WITH DIFFERENTIAL/PLATELET
Basophils Absolute: 0 10*3/uL (ref 0.0–0.1)
Basophils Relative: 1 % (ref 0–1)
Eosinophils Absolute: 0.2 10*3/uL (ref 0.0–0.7)
Eosinophils Relative: 3 % (ref 0–5)
HCT: 42.7 % (ref 39.0–52.0)
Hemoglobin: 13.9 g/dL (ref 13.0–17.0)
Lymphocytes Relative: 27 % (ref 12–46)
Lymphs Abs: 1.9 10*3/uL (ref 0.7–4.0)
MCH: 28.1 pg (ref 26.0–34.0)
MCHC: 32.6 g/dL (ref 30.0–36.0)
MCV: 86.4 fL (ref 78.0–100.0)
Monocytes Absolute: 0.6 10*3/uL (ref 0.1–1.0)
Monocytes Relative: 8 % (ref 3–12)
Neutro Abs: 4.2 10*3/uL (ref 1.7–7.7)
Neutrophils Relative %: 61 % (ref 43–77)
Platelets: 259 10*3/uL (ref 150–400)
RBC: 4.94 MIL/uL (ref 4.22–5.81)
RDW: 13 % (ref 11.5–15.5)
WBC: 6.9 10*3/uL (ref 4.0–10.5)

## 2014-09-11 LAB — BASIC METABOLIC PANEL
Anion gap: 6 (ref 5–15)
BUN: 22 mg/dL (ref 6–23)
CO2: 29 mmol/L (ref 19–32)
Calcium: 8.6 mg/dL (ref 8.4–10.5)
Chloride: 104 mmol/L (ref 96–112)
Creatinine, Ser: 1.09 mg/dL (ref 0.50–1.35)
GFR calc Af Amer: 77 mL/min — ABNORMAL LOW (ref >90)
GFR calc non Af Amer: 66 mL/min — ABNORMAL LOW (ref >90)
Glucose, Bld: 92 mg/dL (ref 70–99)
Potassium: 3.9 mmol/L (ref 3.5–5.1)
Sodium: 139 mmol/L (ref 135–145)

## 2014-09-11 LAB — D-DIMER, QUANTITATIVE: D-Dimer, Quant: 0.47 ug/mL-FEU (ref 0.00–0.48)

## 2014-09-11 LAB — TROPONIN I: Troponin I: <0.03 ng/mL (ref <0.031)

## 2014-09-11 MED ORDER — IPRATROPIUM-ALBUTEROL 0.5-2.5 (3) MG/3ML IN SOLN
3.0000 mL | Freq: Once | RESPIRATORY_TRACT | Status: AC
  Administered 2014-09-11: 3 mL via RESPIRATORY_TRACT
  Filled 2014-09-11: qty 3

## 2014-09-11 NOTE — ED Provider Notes (Signed)
CSN: [id number]     Arrival date & time 09/11/14  1318 History   First MD Initiated Contact with Patient 09/11/14 1332     Chief Complaint  Patient presents with  . Shortness of Breath  . Asthma     (Consider location/radiation/quality/duration/timing/severity/associated sxs/prior Treatment) HPI Comments: 69 yo male with history of Asthma, former smoker and HTN presenting with 12 days of productive cough, intermittent SOB,chest tightness and pain with coughing.  Evaluated and treated in this ER on 09/05/2014 for an asthma exacerbation. Pt reports improvement after breathing treatment in ED, however has not improved since discharge from ED.  Completed steroid as prescribed.  Did not fill Proventil neb due to expense.  Subjective fever last night and using albuterol inhaler 5-6 times daily and feels he is not getting better.    The history is provided by the patient and a relative (son). The history is limited by a language barrier. No language interpreter was used.    Past Medical History  Diagnosis Date  . Asthma   . Hypertension    Past Surgical History  Procedure Laterality Date  . Eye surgery      cateracts   No family history on file. History  Substance Use Topics  . Smoking status: Never Smoker   . Smokeless tobacco: Never Used  . Alcohol Use: No    Review of Systems  HENT: Negative for rhinorrhea.   Cardiovascular: Negative for palpitations and leg swelling.  All other systems reviewed and are negative.     Allergies  Review of patient's allergies indicates no known allergies.  Home Medications   Prior to Admission medications   Medication Sig Start Date End Date Taking? Authorizing Provider  albuterol (PROVENTIL HFA;VENTOLIN HFA) 108 (90 BASE) MCG/ACT inhaler Inhale 2 puffs into the lungs every 2 (two) hours as needed for wheezing or shortness of breath (cough). 09/05/14  Yes Charlesetta Shanks, MD  amLODipine (NORVASC) 5 MG tablet Take 5 mg by mouth daily.   Yes  Historical Provider, MD  dorzolamide (TRUSOPT) 2 % ophthalmic solution Place 1 drop into the right eye 3 (three) times daily.   Yes Historical Provider, MD  fexofenadine (ALLEGRA) 30 MG tablet Take 30 mg by mouth daily as needed (allergies).   Yes Historical Provider, MD  ipratropium (ATROVENT HFA) 17 MCG/ACT inhaler Inhale 2 puffs into the lungs 3 (three) times daily. 09/05/14  Yes Charlesetta Shanks, MD  latanoprost (XALATAN) 0.005 % ophthalmic solution Place 1 drop into the right eye at bedtime.   Yes Historical Provider, MD  prednisoLONE acetate (PRED FORTE) 1 % ophthalmic suspension Place 1 drop into the right eye 2 (two) times daily.   Yes Historical Provider, MD  Spacer/Aero-Holding Chambers (AEROCHAMBER PLUS WITH MASK) inhaler Use as instructed 09/05/14  Yes Charlesetta Shanks, MD  timolol (BETIMOL) 0.5 % ophthalmic solution Place 1 drop into both eyes 2 (two) times daily.   Yes Historical Provider, MD  predniSONE (DELTASONE) 20 MG tablet Take 2 tablets (40 mg total) by mouth daily. Patient not taking: Reported on 09/05/2014 05/26/13   Carlisle Cater, PA-C  predniSONE (DELTASONE) 20 MG tablet 3 tabs po day one, then 2 tabs daily x 4 days Patient not taking: Reported on 09/11/2014 09/05/14   Charlesetta Shanks, MD   BP 156/71 mmHg  Pulse 86  Temp(Src) 98.1 F (36.7 C)  Resp 16  SpO2 100% Physical Exam  Constitutional: Vital signs are normal. He appears well-developed and well-nourished. He is cooperative.  Non-toxic appearance.  He does not have a sickly appearance.  HENT:  Head: Normocephalic and atraumatic.  Eyes: EOM are normal.  Neck: Normal range of motion. Neck supple.  Cardiovascular: Normal rate, regular rhythm, normal heart sounds and intact distal pulses.   No murmur heard. Pulmonary/Chest: No accessory muscle usage. No tachypnea. No respiratory distress. He has decreased breath sounds. He has wheezes in the right upper field and the left upper field. He has rales in the right lower field.   Abdominal: Soft. Normal appearance and bowel sounds are normal.  Lymphadenopathy:    He has no cervical adenopathy.  Neurological: He is alert.  Skin: Skin is warm and dry. No rash noted.  Psychiatric: He has a normal mood and affect.    ED Course  Procedures (including critical care time) Labs Review Labs Reviewed  BASIC METABOLIC PANEL - Abnormal; Notable for the following:    GFR calc non Af Amer 66 (*)    GFR calc Af Amer 77 (*)    All other components within normal limits  TROPONIN I  D-DIMER, QUANTITATIVE  CBC WITH DIFFERENTIAL/PLATELET    Imaging Review Dg Chest 2 View  09/11/2014   CLINICAL DATA:  Shortness of breath and asthma like symptoms for 10 days.  EXAM: CHEST  2 VIEW  COMPARISON:  Chest radiograph 09/05/2014  FINDINGS: Stable cardiac and mediastinal contours with tortuosity of the thoracic aorta. No consolidative pulmonary opacities. No pleural effusion or pneumothorax. Regional skeleton is unremarkable. Probable small eventration left hemidiaphragm.  IMPRESSION: No acute cardiopulmonary process.   Electronically Signed   By: Lovey Newcomer M.D.   On: 09/11/2014 14:25     EKG Interpretation   Date/Time:  Saturday September 11 2014 14:35:27 EDT Ventricular Rate:  90 PR Interval:  181 QRS Duration: 89 QT Interval:  363 QTC Calculation: 444 R Axis:   -42 Text Interpretation:  Sinus rhythm Inferior infarct, old Lateral leads are  also involved Artifact No significant change was found Confirmed by  Wyvonnia Dusky  MD, STEPHEN [phone]) on 09/11/2014 2:51:14 PM      MDM   Final diagnoses:  Cough  Asthma, mild intermittent, uncomplicated   1:22 PM Will order chest xray and duo neb and evaluate. 4:40 PM No pneumonia noted on x-ray. Pt has albuterol at home. Don't think prednisone dosing is needed again. Pt is not hypoxic or tachycardic. pe unlikely. Discussed follow up with pulmonology for continued symptoms   Glendell Docker, NP 09/11/14 [place],  MD 09/11/14 1807

## 2014-09-11 NOTE — ED Notes (Addendum)
Pt from home c/o SOB and asthma x10 days. Pt has hx of the same. Pt family member reports that pt has productive cough with white sputum. Pt reports that he has had a fever. Pt is A&O and  In NAD. Pt was seen at this facility 6 days ago

## 2014-09-11 NOTE — Discharge Instructions (Signed)

## 2014-09-14 ENCOUNTER — Encounter (HOSPITAL_COMMUNITY): Payer: Self-pay | Admitting: *Deleted

## 2014-09-14 ENCOUNTER — Emergency Department (HOSPITAL_COMMUNITY): Payer: Medicaid Other

## 2014-09-14 ENCOUNTER — Inpatient Hospital Stay (HOSPITAL_COMMUNITY)
Admission: EM | Admit: 2014-09-14 | Discharge: 2014-09-16 | DRG: 190 | Disposition: A | Discharge status: Home / Self Care | Payer: Medicaid Other | Attending: Internal Medicine | Admitting: Internal Medicine

## 2014-09-14 DIAGNOSIS — I10 Essential (primary) hypertension: Secondary | ICD-10-CM | POA: Diagnosis present

## 2014-09-14 DIAGNOSIS — J441 Chronic obstructive pulmonary disease with (acute) exacerbation: Secondary | ICD-10-CM | POA: Diagnosis present

## 2014-09-14 DIAGNOSIS — Z87891 Personal history of nicotine dependence: Secondary | ICD-10-CM | POA: Diagnosis not present

## 2014-09-14 DIAGNOSIS — N179 Acute kidney failure, unspecified: Secondary | ICD-10-CM | POA: Diagnosis present

## 2014-09-14 DIAGNOSIS — R7989 Other specified abnormal findings of blood chemistry: Secondary | ICD-10-CM | POA: Diagnosis not present

## 2014-09-14 DIAGNOSIS — R778 Other specified abnormalities of plasma proteins: Secondary | ICD-10-CM | POA: Diagnosis not present

## 2014-09-14 DIAGNOSIS — J45901 Unspecified asthma with (acute) exacerbation: Secondary | ICD-10-CM | POA: Diagnosis present

## 2014-09-14 DIAGNOSIS — J189 Pneumonia, unspecified organism: Secondary | ICD-10-CM | POA: Diagnosis present

## 2014-09-14 DIAGNOSIS — R0602 Shortness of breath: Secondary | ICD-10-CM | POA: Diagnosis not present

## 2014-09-14 HISTORY — DX: Benign prostatic hyperplasia without lower urinary tract symptoms: N40.0

## 2014-09-14 LAB — BASIC METABOLIC PANEL
Anion gap: 7 (ref 5–15)
BUN: 15 mg/dL (ref 6–23)
CO2: 29 mmol/L (ref 19–32)
Calcium: 9 mg/dL (ref 8.4–10.5)
Chloride: 100 mmol/L (ref 96–112)
Creatinine, Ser: 1.22 mg/dL (ref 0.50–1.35)
GFR calc Af Amer: 67 mL/min — ABNORMAL LOW (ref >90)
GFR calc non Af Amer: 58 mL/min — ABNORMAL LOW (ref >90)
Glucose, Bld: 105 mg/dL — ABNORMAL HIGH (ref 70–99)
Potassium: 4.3 mmol/L (ref 3.5–5.1)
Sodium: 136 mmol/L (ref 135–145)

## 2014-09-14 LAB — CBC WITH DIFFERENTIAL/PLATELET
Basophils Absolute: 0 10*3/uL (ref 0.0–0.1)
Basophils Relative: 0 % (ref 0–1)
Eosinophils Absolute: 0.2 10*3/uL (ref 0.0–0.7)
Eosinophils Relative: 2 % (ref 0–5)
HCT: 41.6 % (ref 39.0–52.0)
Hemoglobin: 14 g/dL (ref 13.0–17.0)
Lymphocytes Relative: 8 % — ABNORMAL LOW (ref 12–46)
Lymphs Abs: 0.7 10*3/uL (ref 0.7–4.0)
MCH: 30 pg (ref 26.0–34.0)
MCHC: 33.7 g/dL (ref 30.0–36.0)
MCV: 89.1 fL (ref 78.0–100.0)
Monocytes Absolute: 0.8 10*3/uL (ref 0.1–1.0)
Monocytes Relative: 8 % (ref 3–12)
Neutro Abs: 7.3 10*3/uL (ref 1.7–7.7)
Neutrophils Relative %: 82 % — ABNORMAL HIGH (ref 43–77)
Platelets: 214 10*3/uL (ref 150–400)
RBC: 4.67 MIL/uL (ref 4.22–5.81)
RDW: 13.2 % (ref 11.5–15.5)
WBC: 9 10*3/uL (ref 4.0–10.5)

## 2014-09-14 LAB — TROPONIN I
Troponin I: <0.03 ng/mL (ref <0.031)
Troponin I: <0.03 ng/mL (ref <0.031)

## 2014-09-14 LAB — STREP PNEUMONIAE URINARY ANTIGEN: Strep Pneumo Urinary Antigen: NEGATIVE

## 2014-09-14 MED ORDER — LEVOFLOXACIN IN D5W 750 MG/150ML IV SOLN
750.0000 mg | INTRAVENOUS | Status: DC
  Administered 2014-09-14 – 2014-09-15 (×2): 750 mg via INTRAVENOUS
  Filled 2014-09-14 (×2): qty 150

## 2014-09-14 MED ORDER — ONDANSETRON HCL 4 MG/2ML IJ SOLN
4.0000 mg | Freq: Once | INTRAMUSCULAR | Status: AC
  Administered 2014-09-14: 4 mg via INTRAVENOUS
  Filled 2014-09-14: qty 2

## 2014-09-14 MED ORDER — IPRATROPIUM BROMIDE 0.02 % IN SOLN: 0.5000 mg | Freq: Four times a day (QID) | RESPIRATORY_TRACT | Status: DC

## 2014-09-14 MED ORDER — TIMOLOL MALEATE 0.5 % OP SOLN
1.0000 [drp] | Freq: Two times a day (BID) | OPHTHALMIC | Status: DC
  Administered 2014-09-14 – 2014-09-16 (×4): 1 [drp] via OPHTHALMIC
  Filled 2014-09-14 (×2): qty 5

## 2014-09-14 MED ORDER — ALBUTEROL SULFATE (2.5 MG/3ML) 0.083% IN NEBU: 2.5000 mg | INHALATION_SOLUTION | Freq: Four times a day (QID) | RESPIRATORY_TRACT | Status: DC

## 2014-09-14 MED ORDER — LEVOFLOXACIN IN D5W 750 MG/150ML IV SOLN
750.0000 mg | Freq: Once | INTRAVENOUS | Status: AC
  Administered 2014-09-14: 750 mg via INTRAVENOUS
  Filled 2014-09-14: qty 150

## 2014-09-14 MED ORDER — SODIUM CHLORIDE 0.9 % IJ SOLN
3.0000 mL | Freq: Two times a day (BID) | INTRAMUSCULAR | Status: DC
  Administered 2014-09-14 – 2014-09-16 (×4): 3 mL via INTRAVENOUS

## 2014-09-14 MED ORDER — SODIUM CHLORIDE 0.9 % IJ SOLN: 3.0000 mL | Freq: Two times a day (BID) | INTRAMUSCULAR | Status: DC

## 2014-09-14 MED ORDER — ACETAMINOPHEN 325 MG PO TABS
650.0000 mg | ORAL_TABLET | Freq: Once | ORAL | Status: AC
  Administered 2014-09-14: 650 mg via ORAL
  Filled 2014-09-14: qty 2

## 2014-09-14 MED ORDER — SODIUM CHLORIDE 0.9 % IJ SOLN: 3.0000 mL | INTRAMUSCULAR | Status: DC | PRN

## 2014-09-14 MED ORDER — IPRATROPIUM-ALBUTEROL 0.5-2.5 (3) MG/3ML IN SOLN
3.0000 mL | Freq: Once | RESPIRATORY_TRACT | Status: AC
  Administered 2014-09-14: 3 mL via RESPIRATORY_TRACT

## 2014-09-14 MED ORDER — AMLODIPINE BESYLATE 5 MG PO TABS
5.0000 mg | ORAL_TABLET | Freq: Every day | ORAL | Status: DC
  Administered 2014-09-15 – 2014-09-16 (×2): 5 mg via ORAL
  Filled 2014-09-14 (×2): qty 1

## 2014-09-14 MED ORDER — DOCUSATE SODIUM 100 MG PO CAPS
100.0000 mg | ORAL_CAPSULE | Freq: Two times a day (BID) | ORAL | Status: DC
  Administered 2014-09-14 – 2014-09-16 (×4): 100 mg via ORAL
  Filled 2014-09-14 (×5): qty 1

## 2014-09-14 MED ORDER — PREDNISONE 20 MG PO TABS
40.0000 mg | ORAL_TABLET | Freq: Every day | ORAL | Status: DC
  Administered 2014-09-14 – 2014-09-16 (×3): 40 mg via ORAL
  Filled 2014-09-14 (×5): qty 2

## 2014-09-14 MED ORDER — ONDANSETRON HCL 4 MG PO TABS: 4.0000 mg | ORAL_TABLET | Freq: Four times a day (QID) | ORAL | Status: DC | PRN

## 2014-09-14 MED ORDER — ACETAMINOPHEN 325 MG PO TABS: 650.0000 mg | ORAL_TABLET | Freq: Four times a day (QID) | ORAL | Status: DC | PRN

## 2014-09-14 MED ORDER — PREDNISOLONE ACETATE 1 % OP SUSP
1.0000 [drp] | Freq: Two times a day (BID) | OPHTHALMIC | Status: DC
  Administered 2014-09-14 – 2014-09-15 (×2): 1 [drp] via OPHTHALMIC
  Filled 2014-09-14: qty 1

## 2014-09-14 MED ORDER — DOXAZOSIN MESYLATE 2 MG PO TABS
2.0000 mg | ORAL_TABLET | Freq: Every day | ORAL | Status: DC
  Administered 2014-09-14 – 2014-09-15 (×2): 2 mg via ORAL
  Filled 2014-09-14 (×3): qty 1

## 2014-09-14 MED ORDER — IPRATROPIUM-ALBUTEROL 0.5-2.5 (3) MG/3ML IN SOLN
3.0000 mL | Freq: Four times a day (QID) | RESPIRATORY_TRACT | Status: DC
  Administered 2014-09-14: 3 mL via RESPIRATORY_TRACT
  Filled 2014-09-14: qty 3

## 2014-09-14 MED ORDER — TIMOLOL HEMIHYDRATE 0.5 % OP SOLN: 1.0000 [drp] | Freq: Two times a day (BID) | OPHTHALMIC | Status: DC

## 2014-09-14 MED ORDER — ONDANSETRON HCL 4 MG/2ML IJ SOLN: 4.0000 mg | Freq: Four times a day (QID) | INTRAMUSCULAR | Status: DC | PRN

## 2014-09-14 MED ORDER — GUAIFENESIN ER 600 MG PO TB12
600.0000 mg | ORAL_TABLET | Freq: Two times a day (BID) | ORAL | Status: DC
  Administered 2014-09-14 – 2014-09-16 (×4): 600 mg via ORAL
  Filled 2014-09-14 (×5): qty 1

## 2014-09-14 MED ORDER — IPRATROPIUM-ALBUTEROL 0.5-2.5 (3) MG/3ML IN SOLN
3.0000 mL | Freq: Three times a day (TID) | RESPIRATORY_TRACT | Status: DC
  Administered 2014-09-15 – 2014-09-16 (×5): 3 mL via RESPIRATORY_TRACT
  Filled 2014-09-14 (×5): qty 3

## 2014-09-14 MED ORDER — LORATADINE 10 MG PO TABS
10.0000 mg | ORAL_TABLET | Freq: Every day | ORAL | Status: DC
  Administered 2014-09-14 – 2014-09-16 (×3): 10 mg via ORAL
  Filled 2014-09-14 (×3): qty 1

## 2014-09-14 MED ORDER — GUAIFENESIN-DM 100-10 MG/5ML PO SYRP
5.0000 mL | ORAL_SOLUTION | ORAL | Status: DC | PRN
  Administered 2014-09-15: 5 mL via ORAL
  Filled 2014-09-14 (×3): qty 5

## 2014-09-14 MED ORDER — IPRATROPIUM-ALBUTEROL 0.5-2.5 (3) MG/3ML IN SOLN
RESPIRATORY_TRACT | Status: AC
  Filled 2014-09-14: qty 3

## 2014-09-14 MED ORDER — ALBUTEROL SULFATE (2.5 MG/3ML) 0.083% IN NEBU
5.0000 mg | INHALATION_SOLUTION | Freq: Once | RESPIRATORY_TRACT | Status: AC
  Administered 2014-09-14: 5 mg via RESPIRATORY_TRACT
  Filled 2014-09-14: qty 6

## 2014-09-14 MED ORDER — LATANOPROST 0.005 % OP SOLN
1.0000 [drp] | Freq: Every day | OPHTHALMIC | Status: DC
  Administered 2014-09-14 – 2014-09-15 (×2): 1 [drp] via OPHTHALMIC
  Filled 2014-09-14 (×2): qty 2.5

## 2014-09-14 MED ORDER — DORZOLAMIDE HCL 2 % OP SOLN
1.0000 [drp] | Freq: Three times a day (TID) | OPHTHALMIC | Status: DC
  Administered 2014-09-14 – 2014-09-16 (×6): 1 [drp] via OPHTHALMIC
  Filled 2014-09-14: qty 10

## 2014-09-14 MED ORDER — SODIUM CHLORIDE 0.9 % IV SOLN: 250.0000 mL | INTRAVENOUS | Status: DC | PRN

## 2014-09-14 MED ORDER — ALBUTEROL SULFATE (2.5 MG/3ML) 0.083% IN NEBU
2.5000 mg | INHALATION_SOLUTION | RESPIRATORY_TRACT | Status: DC | PRN
  Administered 2014-09-16 (×2): 2.5 mg via RESPIRATORY_TRACT
  Filled 2014-09-14 (×2): qty 3

## 2014-09-14 MED ORDER — ACETAMINOPHEN 650 MG RE SUPP: 650.0000 mg | Freq: Four times a day (QID) | RECTAL | Status: DC | PRN

## 2014-09-14 MED ORDER — IPRATROPIUM BROMIDE 0.02 % IN SOLN
0.5000 mg | Freq: Once | RESPIRATORY_TRACT | Status: AC
  Administered 2014-09-14: 0.5 mg via RESPIRATORY_TRACT
  Filled 2014-09-14: qty 2.5

## 2014-09-14 MED ORDER — ENOXAPARIN SODIUM 40 MG/0.4ML ~~LOC~~ SOLN
40.0000 mg | SUBCUTANEOUS | Status: DC
  Administered 2014-09-14 – 2014-09-15 (×2): 40 mg via SUBCUTANEOUS
  Filled 2014-09-14 (×3): qty 0.4

## 2014-09-14 NOTE — ED Notes (Signed)
Pt states that he has had trouble with his asthma for 10 days. Pt has been to the ED 3 times for the same. Pt noted to have expiratory wheezing.

## 2014-09-14 NOTE — Progress Notes (Signed)
Bryan Sullivan is a 69 y.o. male patient admitted from ED awake, alert - oriented  X 4 - no acute distress noted.  VSS - Blood pressure 129/75, pulse 114, temperature 98.8 F (37.1 C), temperature source Oral, resp. rate 18, weight 85.078 kg (187 lb 9 oz), SpO2 94 %.    IV in place, occlusive dsg intact without redness.  Orientation to room, and floor completed with information packet given to patient/family. Admission INP armband ID verified with patient/family, and in place.   SR up x 2, fall assessment complete, with patient and family able to verbalize understanding of risk associated with falls, and verbalized understanding to call nsg before up out of bed.  Call light within reach, patient able to voice, and demonstrate understanding.  Skin, clean-dry- intact without evidence of bruising, or skin tears.   No evidence of skin break down noted on exam.     Will cont to eval and treat per MD orders.  Henriette Combs, [place] 09/14/2014 6:16 PM

## 2014-09-14 NOTE — ED Provider Notes (Signed)
CSN: [id number]     Arrival date & time 09/14/14  1143 History   First MD Initiated Contact with Patient 09/14/14 1252     Chief Complaint  Patient presents with  . Asthma   (Consider location/radiation/quality/duration/timing/severity/associated sxs/prior Treatment) HPI  Bryan Sullivan is a 69 -year-old male with PMH of asthma and HTN, presenting with worsening cough and shortness of breath. His son states he has had these symptoms for 10-12 days and has been evaluated twice in the last 2 weeks, with diagnosis of asthma exacerbation. Patient reports he began having muscle aches and chills, 2 days ago. He also reports a productive cough with thick white sputum. He currently rates his discomfort as 10 out of 10. He reports pain in his chest but only when coughing. He denies any nausea, vomiting, abdominal pain, diaphoresis or lightheadedness.  Past Medical History  Diagnosis Date  . Asthma   . Hypertension    Past Surgical History  Procedure Laterality Date  . Eye surgery      cateracts   No family history on file. History  Substance Use Topics  . Smoking status: Never Smoker   . Smokeless tobacco: Never Used  . Alcohol Use: No    Review of Systems  Constitutional: Positive for fever and chills.  HENT: Negative for sore throat.   Eyes: Negative for visual disturbance.  Respiratory: Positive for cough, chest tightness, shortness of breath and wheezing.   Cardiovascular: Negative for chest pain and leg swelling.  Gastrointestinal: Negative for nausea, vomiting and diarrhea.  Genitourinary: Negative for dysuria.  Musculoskeletal: Positive for myalgias.  Skin: Negative for rash.  Neurological: Negative for weakness, numbness and headaches.      Allergies  Review of patient's allergies indicates no known allergies.  Home Medications   Prior to Admission medications   Medication Sig Start Date End Date Taking? Authorizing Provider  albuterol (PROVENTIL HFA;VENTOLIN  HFA) 108 (90 BASE) MCG/ACT inhaler Inhale 2 puffs into the lungs every 2 (two) hours as needed for wheezing or shortness of breath (cough). 09/05/14   Charlesetta Shanks, MD  amLODipine (NORVASC) 5 MG tablet Take 5 mg by mouth daily.    Historical Provider, MD  dorzolamide (TRUSOPT) 2 % ophthalmic solution Place 1 drop into the right eye 3 (three) times daily.    Historical Provider, MD  fexofenadine (ALLEGRA) 30 MG tablet Take 30 mg by mouth daily as needed (allergies).    Historical Provider, MD  ipratropium (ATROVENT HFA) 17 MCG/ACT inhaler Inhale 2 puffs into the lungs 3 (three) times daily. 09/05/14   Charlesetta Shanks, MD  latanoprost (XALATAN) 0.005 % ophthalmic solution Place 1 drop into the right eye at bedtime.    Historical Provider, MD  prednisoLONE acetate (PRED FORTE) 1 % ophthalmic suspension Place 1 drop into the right eye 2 (two) times daily.    Historical Provider, MD  predniSONE (DELTASONE) 20 MG tablet Take 2 tablets (40 mg total) by mouth daily. Patient not taking: Reported on 09/05/2014 05/26/13   Carlisle Cater, PA-C  predniSONE (DELTASONE) 20 MG tablet 3 tabs po day one, then 2 tabs daily x 4 days Patient not taking: Reported on 09/11/2014 09/05/14   Charlesetta Shanks, MD  Spacer/Aero-Holding Chambers (AEROCHAMBER PLUS WITH MASK) inhaler Use as instructed 09/05/14   Charlesetta Shanks, MD  timolol (BETIMOL) 0.5 % ophthalmic solution Place 1 drop into both eyes 2 (two) times daily.    Historical Provider, MD   BP 127/107 mmHg  Pulse 116  Temp(Src) 98.8  F (37.1 C) (Oral)  Resp 18  Wt 187 lb 9 oz (85.078 kg)  SpO2 97% Physical Exam  Constitutional: He is oriented to person, place, and time. He appears well-developed and well-nourished. No distress.  HENT:  Head: Normocephalic and atraumatic.  Eyes: Conjunctivae are normal.  Neck: Neck supple.  Cardiovascular: Regular rhythm and intact distal pulses.  Tachycardia present.   Pulmonary/Chest: Effort normal. No respiratory distress. He has  decreased breath sounds in the right upper field, the right middle field, the right lower field, the left upper field, the left middle field and the left lower field. He has wheezes in the right middle field and the left middle field. He has no rhonchi. He has no rales. He exhibits no tenderness.  Abdominal: Soft. There is no tenderness.  Musculoskeletal: He exhibits no tenderness.  Lymphadenopathy:    He has no cervical adenopathy.  Neurological: He is alert and oriented to person, place, and time.  Skin: Skin is warm and dry. No rash noted. He is not diaphoretic.  Psychiatric: He has a normal mood and affect.  Nursing note and vitals reviewed.   ED Course  Procedures (including critical care time) Labs Review Labs Reviewed  BASIC METABOLIC PANEL - Abnormal; Notable for the following:    Glucose, Bld 105 (*)    GFR calc non Af Amer 58 (*)    GFR calc Af Amer 67 (*)    All other components within normal limits  CBC WITH DIFFERENTIAL/PLATELET - Abnormal; Notable for the following:    Neutrophils Relative % 82 (*)    Lymphocytes Relative 8 (*)    All other components within normal limits    Imaging Review Dg Chest 2 View  09/14/2014   CLINICAL DATA:  Chest pain shortness of breath cough for 2 days history of hypertension and asthma  EXAM: CHEST  2 VIEW  COMPARISON:  09/11/2014  FINDINGS: Heart size and vascular pattern are normal. There is hyperinflation consistent with COPD. Stable lateral bronchitic change. Mildly increased right lower lobe opacity. Possible pulmonary nodule measuring about 5 mm over the lateral left lower lobe, verse is a nipple shadow.  IMPRESSION: COPD with possible right lower lobe developing infiltrate.  Nipple shadow versus pulmonary nodule on the left. Consider repeating the PA view with nipple markers.   Electronically Signed   By: Skipper Cliche M.D.   On: 09/14/2014 14:27     EKG Interpretation   Date/Time:  Tuesday September 14 2014 12:01:48 EDT Ventricular  Rate:  107 PR Interval:  178 QRS Duration: 86 QT Interval:  340 QTC Calculation: 453 R Axis:   -36 Text Interpretation:  Sinus tachycardia with occasional Premature  ventricular complexes Left axis deviation Abnormal ECG tachycardia new  since previous  Confirmed by YAO  MD, DAVID ([id number]) on 09/14/2014 1:10:58  PM      MDM   Final diagnoses:  Community acquired pneumonia   69 yo male presenting for third visit in 10 days for coughing and apparent asthma exacerbation.  New fever and chills 2 days ago. Discussed case with Dr. Darl Householder. Patient has been diagnosed with pneumonia via chest xray. Albuterol nebs improved wheezing mildly however his work of breathing increases significantly with ambulation.  Levaquin IV ordered. Consulted Dr. Tyrell Antonio (hospitalist) regarding admission for CAP treatment. Pt will be admitted to telemetry bed.  Discussed plan with the pt and his family. The patient appears reasonably stabilized for admission considering the current resources, flow, and capabilities available in the  ED at this time, and I doubt any further screening and/or treatment in the ED prior to admission.   Filed Vitals:   09/14/14 1315 09/14/14 1415 09/14/14 1500 09/14/14 1530  BP: 146/62 132/73 129/72 130/73  Pulse: 99 102 98 99  Temp:  100.3 F    TempSrc:  Oral  Oral  Resp: 24 27 22 24   Weight:      SpO2: 97% 98% 92% 97%   Meds given in ED:  Medications    Or  predniSONE (DELTASONE) tablet 40 mg (40 mg Oral Given 09/14/14 1650)  ipratropium-albuterol (DUONEB) 0.5-2.5 (3) MG/3ML nebulizer solution 3 mL (not administered)  ipratropium-albuterol (DUONEB) 0.5-2.5 (3) MG/3ML nebulizer solution 3 mL ( Nebulization Not Given 09/14/14 1544)  albuterol (PROVENTIL) (2.5 MG/3ML) 0.083% nebulizer solution 5 mg (5 mg Nebulization Given 09/14/14 1354)  ipratropium (ATROVENT) nebulizer solution 0.5 mg (0.5 mg Nebulization Given 09/14/14 1354)  acetaminophen (TYLENOL) tablet 650 mg (650 mg Oral Given  09/14/14 1353)  levofloxacin (LEVAQUIN) IVPB 750 mg (750 mg Intravenous Transfusing/Transfer 09/14/14 1646)    New Prescriptions   No medications on file       Britt Bottom, NP 09/14/14 2210  Wandra Arthurs, MD 09/17/14 1041

## 2014-09-14 NOTE — ED Notes (Signed)
Patient is able to ambulate but becomes short of breath.

## 2014-09-14 NOTE — ED Notes (Signed)
Spoke with MD about vomitting. Zofran ordered.

## 2014-09-14 NOTE — H&P (Signed)
[facility] History and Physical  Eliasar Hlavaty VCB:[id number] DOB: 1945/11/13 DOA: 09/14/2014  Referring physician: Alvino Chapel PCP: No PCP Per Patient In [facility].   Chief Complaint: SOB, Cough.   HPI: Bryan Sullivan is a 69 y.o. male with PMH significant for Asthma, HTN who presents complaining of persistent cough and dyspnea. He relates dyspnea at rest and on exertion. He relates productive cough. Chest pain after coughing since 10 days ago. He was seeing in the ED twice for the same condition. He was discharge on prednisone and nebulizer. His symptoms has not improved. Prior evaluation: D dimer negative, BNP 31.  Evaluation in the ED; Chest x ray with COPD changes, right lower lobe developing infiltrates. EKG sinus tachycardia. He received nebulizer and IV Levaquin in the ED.    Review of Systems:  Negative, except as per HPI.   Past Medical History  Diagnosis Date  . Asthma   . Hypertension    Past Surgical History  Procedure Laterality Date  . Eye surgery      cateracts   Social History:  reports that he has never smoked. He has never used smokeless tobacco. He reports that he does not drink alcohol or use illicit drugs.  No Known Allergies  Family History: Asthma runs in his family.    Prior to Admission medications   Medication Sig Start Date End Date Taking? Authorizing Provider  albuterol (PROVENTIL HFA;VENTOLIN HFA) 108 (90 BASE) MCG/ACT inhaler Inhale 2 puffs into the lungs every 2 (two) hours as needed for wheezing or shortness of breath (cough). 09/05/14   Charlesetta Shanks, MD  amLODipine (NORVASC) 5 MG tablet Take 5 mg by mouth daily.    Historical Provider, MD  dorzolamide (TRUSOPT) 2 % ophthalmic solution Place 1 drop into the right eye 3 (three) times daily.    Historical Provider, MD  fexofenadine (ALLEGRA) 30 MG tablet Take 30 mg by mouth daily as needed (allergies).    Historical Provider, MD  ipratropium (ATROVENT HFA) 17 MCG/ACT inhaler Inhale 2  puffs into the lungs 3 (three) times daily. 09/05/14   Charlesetta Shanks, MD  latanoprost (XALATAN) 0.005 % ophthalmic solution Place 1 drop into the right eye at bedtime.    Historical Provider, MD  prednisoLONE acetate (PRED FORTE) 1 % ophthalmic suspension Place 1 drop into the right eye 2 (two) times daily.    Historical Provider, MD  predniSONE (DELTASONE) 20 MG tablet Take 2 tablets (40 mg total) by mouth daily. Patient not taking: Reported on 09/05/2014 05/26/13   Carlisle Cater, PA-C  predniSONE (DELTASONE) 20 MG tablet 3 tabs po day one, then 2 tabs daily x 4 days Patient not taking: Reported on 09/11/2014 09/05/14   Charlesetta Shanks, MD  Spacer/Aero-Holding Chambers (AEROCHAMBER PLUS WITH MASK) inhaler Use as instructed 09/05/14   Charlesetta Shanks, MD  timolol (BETIMOL) 0.5 % ophthalmic solution Place 1 drop into both eyes 2 (two) times daily.    Historical Provider, MD   Physical Exam: Filed Vitals:   09/14/14 1445 09/14/14 1500 09/14/14 1515 09/14/14 1530  BP: 123/68 129/72 126/69 130/73  Pulse: 97 98 97 99  Temp:      TempSrc:      Resp: 22 22 21 24   Weight:      SpO2: 97% 95% 98% 97%    Wt Readings from Last 3 Encounters:  09/14/14 85.078 kg (187 lb 9 oz)    General:  Appears calm and comfortable, coughing a lot Eyes: PERRL, normal lids, irises & conjunctiva ENT:  grossly normal hearing, lips & tongue Neck: no LAD, masses or thyromegaly Cardiovascular: RRR, no m/r/g. No LE edema. Telemetry: SR, no arrhythmias  Respiratory: Normal respiratory effort. Bilateral ronchus, sporadic wheezing.  Abdomen: soft, ntnd Skin: no rash or induration seen on limited exam Musculoskeletal: grossly normal tone BUE/BLE Psychiatric: grossly normal mood and affect, speech fluent and appropriate Neurologic: grossly non-focal.          Labs on Admission:  Basic Metabolic Panel:  Recent Labs Lab 09/11/14 1510 09/14/14 1402  NA 139 136  K 3.9 4.3  CL 104 100  CO2 29 29  GLUCOSE 92 105*    BUN 22 15  CREATININE 1.09 1.22  CALCIUM 8.6 9.0   Liver Function Tests: No results for input(s): AST, ALT, ALKPHOS, BILITOT, PROT, ALBUMIN in the last 168 hours. No results for input(s): LIPASE, AMYLASE in the last 168 hours. No results for input(s): AMMONIA in the last 168 hours. CBC:  Recent Labs Lab 09/11/14 1510 09/14/14 1402  WBC 6.9 9.0  NEUTROABS 4.2 7.3  HGB 13.9 14.0  HCT 42.7 41.6  MCV 86.4 89.1  PLT 259 214   Cardiac Enzymes:  Recent Labs Lab 09/11/14 1510  TROPONINI <0.03    BNP (last 3 results)  Recent Labs  09/05/14 1617  BNP 31.2    ProBNP (last 3 results) No results for input(s): PROBNP in the last 8760 hours.  CBG: No results for input(s): GLUCAP in the last 168 hours.  Radiological Exams on Admission: Dg Chest 2 View  09/14/2014   CLINICAL DATA:  Chest pain shortness of breath cough for 2 days history of hypertension and asthma  EXAM: CHEST  2 VIEW  COMPARISON:  09/11/2014  FINDINGS: Heart size and vascular pattern are normal. There is hyperinflation consistent with COPD. Stable lateral bronchitic change. Mildly increased right lower lobe opacity. Possible pulmonary nodule measuring about 5 mm over the lateral left lower lobe, verse is a nipple shadow.  IMPRESSION: COPD with possible right lower lobe developing infiltrate.  Nipple shadow versus pulmonary nodule on the left. Consider repeating the PA view with nipple markers.   Electronically Signed   By: Skipper Cliche M.D.   On: 09/14/2014 14:27    EKG: Independently reviewed. Sinus tachycardia, PCV, left axis deviation.   Assessment/Plan Principal Problem:   PNA (pneumonia) Active Problems:   Pneumonia   Benign essential HTN  1-PNA; community acquired:  Agree with Levaquin.  Sputum culture, strep and legionella antigen ordered.   2-Dyspnea; Might be related to PNA, also Asthma exacerbation/COPD;  Will continue with prednisone, nebulizer treatments.   3-Chest pain: Likely  related  to frequent cough. D dimer negative. Cycle enzymes.  4-HTN; continue with Norvasc.     Code Status: Full Code.  DVT Prophylaxis:Lovenox.  Family Communication: Care discussed with son who was at bedside.  Disposition Plan: Expect 2 to 3 days inpatient.   Time spent: 75 minutes.   Niel Hummer A [facility] Pager [phone]

## 2014-09-14 NOTE — ED Notes (Signed)
Patient transported to X-ray 

## 2014-09-14 NOTE — Progress Notes (Signed)
Report called to ED. Jana Half RN to call back with report.

## 2014-09-15 ENCOUNTER — Inpatient Hospital Stay (HOSPITAL_COMMUNITY): Payer: Medicaid Other

## 2014-09-15 DIAGNOSIS — R7989 Other specified abnormal findings of blood chemistry: Secondary | ICD-10-CM

## 2014-09-15 DIAGNOSIS — I10 Essential (primary) hypertension: Secondary | ICD-10-CM

## 2014-09-15 DIAGNOSIS — J189 Pneumonia, unspecified organism: Secondary | ICD-10-CM

## 2014-09-15 LAB — BASIC METABOLIC PANEL
Anion gap: 9 (ref 5–15)
BUN: 19 mg/dL (ref 6–23)
CO2: 27 mmol/L (ref 19–32)
Calcium: 8.8 mg/dL (ref 8.4–10.5)
Chloride: 100 mmol/L (ref 96–112)
Creatinine, Ser: 1.51 mg/dL — ABNORMAL HIGH (ref 0.50–1.35)
GFR calc Af Amer: 52 mL/min — ABNORMAL LOW (ref >90)
GFR calc non Af Amer: 45 mL/min — ABNORMAL LOW (ref >90)
Glucose, Bld: 132 mg/dL — ABNORMAL HIGH (ref 70–99)
Potassium: 4.1 mmol/L (ref 3.5–5.1)
Sodium: 136 mmol/L (ref 135–145)

## 2014-09-15 LAB — CBC
HCT: 37 % — ABNORMAL LOW (ref 39.0–52.0)
Hemoglobin: 13.1 g/dL (ref 13.0–17.0)
MCH: 31.6 pg (ref 26.0–34.0)
MCHC: 35.4 g/dL (ref 30.0–36.0)
MCV: 89.2 fL (ref 78.0–100.0)
Platelets: 204 10*3/uL (ref 150–400)
RBC: 4.15 MIL/uL — ABNORMAL LOW (ref 4.22–5.81)
RDW: 13.3 % (ref 11.5–15.5)
WBC: 14.8 10*3/uL — ABNORMAL HIGH (ref 4.0–10.5)

## 2014-09-15 LAB — LEGIONELLA ANTIGEN, URINE

## 2014-09-15 LAB — TROPONIN I
Troponin I: 0.08 ng/mL — ABNORMAL HIGH (ref <0.031)
Troponin I: 0.04 ng/mL — ABNORMAL HIGH (ref <0.031)
Troponin I: <0.03 ng/mL (ref <0.031)

## 2014-09-15 LAB — INFLUENZA PANEL BY PCR (TYPE A & B)
H1N1 flu by pcr: NOT DETECTED
Influenza A By PCR: NEGATIVE
Influenza B By PCR: NEGATIVE

## 2014-09-15 MED ORDER — PREDNISOLONE ACETATE 1 % OP SUSP
1.0000 [drp] | Freq: Every morning | OPHTHALMIC | Status: DC
  Administered 2014-09-16: 1 [drp] via OPHTHALMIC
  Filled 2014-09-15: qty 1

## 2014-09-15 NOTE — Progress Notes (Signed)
Utilization review completed. Talha Iser, RN, BSN. 

## 2014-09-15 NOTE — Progress Notes (Addendum)
[facility] PROGRESS NOTE  Kaylem Gidney MWU:[id number] DOB: 08-22-1945 DOA: 09/14/2014 PCP: No PCP Per Patient   brief narrative 69 year old [place] male with history of asthma? COPD, hypertension who presented with dyspnea on exertion for the past 10 days associated with cough and pleuritic chest pain. He was seen in the ED twice recently for the same symptoms and discharged home on prednisone and nebulizer. Patient returned to the ED with recurrence of symptoms. Chest x-ray in the ED showed COPD changes with right lower lobe infiltrate. Patient admitted to telemetry for community acquired pneumonia/COPD exacerbation.   Assessment/Plan: Community acquired pneumonia Placed on empiric Levaquin. Strep pneumonia and legionella antigen negative. Blood culture not done. Flu PCR negative. Supportive care with antitussives and Tylenol. Symptoms improving. Patient afebrile.  Acute COPD/ asthma exacerbation Patient reports history of asthma and smoking history. Placed on oral son and nebs with clinical improvement.   Chest pain on admission Appears pleuritic associated with coughing. Has mild troponin elevation which has been improving but EKG unremarkable. Will cycle further troponin. Does not have further symptoms. Check 2-D echo to evaluate EF and wall motion. D-dimer negative  Essential hypertension Continue Norvasc.   Acute kidney injury No baseline renal function in the system. Monitor in  a.m.  Code Status: Full code Family Communication: Discussed with nephew at bedside. Disposition Plan: Home possibly in 24-48 hours. Patient is currently staying with his son and nephew between [place] and [place]. He plans to be in the [place] for at least few months.    Consultants:  none  Procedures:  2 d echo  Antibiotics:  Iv levaquin  HPI/Subjective: Patient seen and examined. He reports his breathing to be better. Denies any chest pain  Objective: Filed Vitals:    09/15/14 1014  BP: 130/69  Pulse:   Temp:   Resp:     Intake/Output Summary (Last 24 hours) at 09/15/14 1626 Last data filed at 09/15/14 0602  Gross per 24 hour  Intake    250 ml  Output    240 ml  Net     10 ml   Filed Weights   09/14/14 1153  Weight: 85.078 kg (187 lb 9 oz)    Exam:   General:  Alert male in no acute distress  HEENT: No pallor, moist oral mucosa, supple neck  Chest: Scattered rhonchi bilaterally, no crackles or wheeze  CVS: Normal S1 and S2, no murmurs  GI: Soft, nondistended, nontender, bowel sounds present  Musculoskeletal: Warm, no edema  CNS: Alert and oriented    Data Reviewed: Basic Metabolic Panel:  Recent Labs Lab 09/11/14 1510 09/14/14 1402 09/15/14 0705  NA 139 136 136  K 3.9 4.3 4.1  CL 104 100 100  CO2 29 29 27   GLUCOSE 92 105* 132*  BUN 22 15 19   CREATININE 1.09 1.22 1.51*  CALCIUM 8.6 9.0 8.8   Liver Function Tests: No results for input(s): AST, ALT, ALKPHOS, BILITOT, PROT, ALBUMIN in the last 168 hours. No results for input(s): LIPASE, AMYLASE in the last 168 hours. No results for input(s): AMMONIA in the last 168 hours. CBC:  Recent Labs Lab 09/11/14 1510 09/14/14 1402 09/15/14 0705  WBC 6.9 9.0 14.8*  NEUTROABS 4.2 7.3  --   HGB 13.9 14.0 13.1  HCT 42.7 41.6 37.0*  MCV 86.4 89.1 89.2  PLT 259 214 204   Cardiac Enzymes:  Recent Labs Lab 09/11/14 1510 09/14/14 2016 09/14/14 2240 09/15/14 0705 09/15/14 1451  TROPONINI <0.03 <0.03 <0.03 0.08* 0.04*  BNP (last 3 results)  Recent Labs  09/05/14 1617  BNP 31.2    ProBNP (last 3 results) No results for input(s): PROBNP in the last 8760 hours.  CBG: No results for input(s): GLUCAP in the last 168 hours.  No results found for this or any previous visit (from the past 240 hour(s)).   Studies: Dg Chest 2 View  09/15/2014   CLINICAL DATA:  Cough, shortness breath, congestion  EXAM: CHEST - 2 VIEW  COMPARISON:  the previous day's study   FINDINGS: Worsening patchy airspace disease in the posterior right lower lobe. Left lung remains clear. Heart size normal. No effusion.  No pneumothorax. Visualized skeletal structures are unremarkable.  IMPRESSION: 1. Worsening posterior right lower lobe pneumonia.   Electronically Signed   By: Lucrezia Europe M.D.   On: 09/15/2014 09:11   Dg Chest 2 View  09/14/2014   CLINICAL DATA:  Chest pain shortness of breath cough for 2 days history of hypertension and asthma  EXAM: CHEST  2 VIEW  COMPARISON:  09/11/2014  FINDINGS: Heart size and vascular pattern are normal. There is hyperinflation consistent with COPD. Stable lateral bronchitic change. Mildly increased right lower lobe opacity. Possible pulmonary nodule measuring about 5 mm over the lateral left lower lobe, verse is a nipple shadow.  IMPRESSION: COPD with possible right lower lobe developing infiltrate.  Nipple shadow versus pulmonary nodule on the left. Consider repeating the PA view with nipple markers.   Electronically Signed   By: Skipper Cliche M.D.   On: 09/14/2014 14:27    Scheduled Meds: . amLODipine  5 mg Oral Daily  . docusate sodium  100 mg Oral BID  . dorzolamide  1 drop Right Eye TID  . doxazosin  2 mg Oral Daily  . enoxaparin (LOVENOX) injection  40 mg Subcutaneous Q24H  . guaiFENesin  600 mg Oral BID  . ipratropium-albuterol  3 mL Nebulization TID  . latanoprost  1 drop Right Eye QHS  . levofloxacin (LEVAQUIN) IV  750 mg Intravenous Q24H  . loratadine  10 mg Oral Daily  . prednisoLONE acetate  1 drop Right Eye BID  . predniSONE  40 mg Oral Q breakfast  . sodium chloride  3 mL Intravenous Q12H  . timolol  1 drop Both Eyes BID   Continuous Infusions:     Time spent: 25 minutes    Mcgregor Tinnon, [place]  [facility] Pager [phone] If 7PM-7AM, please contact night-coverage at [email or web address], password [email or web address] 09/15/2014, 4:26 PM  LOS: 1 day

## 2014-09-15 NOTE — Care Management Note (Signed)
    Page 1 of 1   09/16/2014     11:13:50 AM CARE MANAGEMENT NOTE 09/16/2014  Patient:  Bryan Sullivan   Account Number:  [id number]  Date Initiated:  09/15/2014  Documentation initiated by:  Carles Collet  Subjective/Objective Assessment:   COPD RLL PNU, r/o Flu, fever, IV abx     Action/Plan:   will follow for dc needs.   Anticipated DC Date:  09/16/2014   Anticipated DC Plan:  [facility]  CM consult  Follow-up appt scheduled      Choice offered to / List presented to:             Status of service:  Completed, signed off Medicare Important Message given?   (If response is "NO", the following Medicare IM given date fields will be blank) Date Medicare IM given:   Medicare IM given by:   Date Additional Medicare IM given:   Additional Medicare IM given by:    Discharge Disposition:  HOME/SELF CARE  Per UR Regulation:  Reviewed for med. necessity/level of care/duration of stay  If discussed at [place] of Stay Meetings, dates discussed:    Comments:  09-16-14 Appointment made at [facility] Monday 25th at 10:30, no other CM needs. Debbie Transport planner BSN CM  09-15-14 Pt given information on [facility], will follow up at time of dc if pt wants apt made. Carles Collet RN BSN CM

## 2014-09-16 DIAGNOSIS — R778 Other specified abnormalities of plasma proteins: Secondary | ICD-10-CM | POA: Diagnosis not present

## 2014-09-16 DIAGNOSIS — J181 Lobar pneumonia, unspecified organism: Secondary | ICD-10-CM

## 2014-09-16 DIAGNOSIS — R7989 Other specified abnormal findings of blood chemistry: Secondary | ICD-10-CM

## 2014-09-16 DIAGNOSIS — J189 Pneumonia, unspecified organism: Secondary | ICD-10-CM | POA: Diagnosis present

## 2014-09-16 DIAGNOSIS — J441 Chronic obstructive pulmonary disease with (acute) exacerbation: Principal | ICD-10-CM

## 2014-09-16 LAB — EXPECTORATED SPUTUM ASSESSMENT W GRAM STAIN, RFLX TO RESP C

## 2014-09-16 LAB — EXPECTORATED SPUTUM ASSESSMENT W REFEX TO RESP CULTURE

## 2014-09-16 LAB — TROPONIN I: Troponin I: 0.03 ng/mL (ref <0.031)

## 2014-09-16 MED ORDER — PREDNISONE 20 MG PO TABS: 40.0000 mg | ORAL_TABLET | Freq: Every day | ORAL | Status: AC

## 2014-09-16 MED ORDER — PREDNISONE 20 MG PO TABS: 40.0000 mg | ORAL_TABLET | Freq: Every day | ORAL | Status: DC

## 2014-09-16 MED ORDER — GUAIFENESIN-DM 100-10 MG/5ML PO SYRP: 5.0000 mL | ORAL_SOLUTION | ORAL | Status: DC | PRN

## 2014-09-16 MED ORDER — LEVOFLOXACIN 750 MG PO TABS: 750.0000 mg | ORAL_TABLET | Freq: Every day | ORAL | Status: AC

## 2014-09-16 MED ORDER — LEVOFLOXACIN 750 MG PO TABS
750.0000 mg | ORAL_TABLET | Freq: Every day | ORAL | Status: DC
  Filled 2014-09-16: qty 1

## 2014-09-16 NOTE — Progress Notes (Signed)
  Echocardiogram 2D Echocardiogram has been performed.  Bryan Sullivan 09/16/2014, 11:34 AM

## 2014-09-16 NOTE — Discharge Instructions (Signed)

## 2014-09-16 NOTE — Discharge Summary (Signed)
Physician Discharge Summary  Bryan Sullivan NIO:[id number] DOB: February 02, 1946 DOA: 09/14/2014  PCP: No PCP Per Patient  Admit date: 09/14/2014 Discharge date: 09/16/2014  Time spent: 30 minutes  Recommendations for Outpatient Follow-up:  1. Discharge home. Follow-up as a new patient at [facility] on 4/25. 2. Patient will need follow-up chest x-ray in 4 weeks to evaluate resolution of pneumonia  Discharge Diagnoses:  Principal Problem:   CAP (community acquired pneumonia)  Active Problems:   Benign essential HTN   COPD exacerbation   Elevated troponin   Discharge Condition: Fair  Diet recommendation: Low sodium  Filed Weights   09/14/14 1153 09/16/14 1517  Weight: 85.078 kg (187 lb 9 oz) 86.1 kg (189 lb 13.1 oz)    History of present illness:  Please refer to admission H&P for details, in brief, 69 year old [place] male with history of asthma? COPD, hypertension who presented with dyspnea on exertion for the past 10 days associated with cough and pleuritic chest pain. He was seen in the ED twice recently for the same symptoms and discharged home on prednisone and nebulizer. Patient returned to the ED with recurrence of symptoms. Chest x-ray in the ED showed COPD changes with right lower lobe infiltrate. Patient admitted to telemetry for community acquired pneumonia/COPD exacerbation.  Hospital Course:  Community acquired pneumonia Placed on empiric Levaquin. Strep pneumonia and legionella antigen negative. Blood culture not done. Flu PCR negative. Supportive care with antitussives and Tylenol. Symptoms much improved. Was discharged on oral Levaquin to complete 5 day course of antibiotics.   Acute COPD exacerbation Patient reports history of asthma and smoking history. Placed on oral prednisone and nebs. Resume home inhalers. Will discharge on 5 days of oral prednisone.    Chest pain on admission with elevated troponin Appears pleuritic associated with  coughing. Has mild troponin elevation which peaked at 0.08 and now normalized.  EKG unremarkable unremarkable.  No further symptoms. 2-D echo done with EF of 50-55% and grade 1 diastolic dysfunction. Wall motion abnormality not detected.  Essential hypertension Continue Norvasc.   Acute kidney injury No baseline renal function in the system. Monitor in a.m.  Code Status: Full code  Family Communication: Discussed with son at bedside  Disposition Plan: Home with outpatient follow-up at [place]    Consultants:  none  Procedures:  2 d echo  Antibiotics:  Levaquin  Discharge Exam: Filed Vitals:   09/16/14 1334  BP: 100/84  Pulse: 85  Temp: 97.7 F (36.5 C)  Resp: 18    General: Elderly male in no acute distress  HEENT: No pallor, moist oral mucosa, supple neck  Chest: Few bilateral rhonchi, no wheeze or crackles  CVS: Normal S1 and S2, no murmurs  GI: Soft, nondistended, nontender, bowel sounds present  Musculoskeletal: Warm, no edema  CNS: Alert and oriented  Discharge Instructions    Current Discharge Medication List    START taking these medications   Details  guaiFENesin-dextromethorphan (ROBITUSSIN DM) 100-10 MG/5ML syrup Take 5 mLs by mouth every 4 (four) hours as needed for cough. Qty: 118 mL, Refills: 0    levofloxacin (LEVAQUIN) 750 MG tablet Take 1 tablet (750 mg total) by mouth at bedtime. Qty: 3 tablet, Refills: 0    predniSONE (DELTASONE) 20 MG tablet Take 2 tablets (40 mg total) by mouth daily with breakfast. Qty: 10 tablet, Refills: 0      CONTINUE these medications which have NOT CHANGED   Details  albuterol (PROVENTIL HFA;VENTOLIN HFA) 108 (90 BASE) MCG/ACT  inhaler Inhale 2 puffs into the lungs every 2 (two) hours as needed for wheezing or shortness of breath (cough). Qty: 1 Inhaler, Refills: 0    amLODipine (NORVASC) 5 MG tablet Take 5 mg by mouth daily.    brimonidine (ALPHAGAN) 0.2 % ophthalmic solution  Place 1 drop into the right eye 3 (three) times daily.    dorzolamide (TRUSOPT) 2 % ophthalmic solution Place 1 drop into the right eye 3 (three) times daily.    fexofenadine (ALLEGRA) 30 MG tablet Take 30 mg by mouth daily as needed (allergies).    ipratropium (ATROVENT HFA) 17 MCG/ACT inhaler Inhale 2 puffs into the lungs 3 (three) times daily. Qty: 1 Inhaler, Refills: 12    latanoprost (XALATAN) 0.005 % ophthalmic solution Place 1 drop into the right eye at bedtime.    prednisoLONE acetate (PRED FORTE) 1 % ophthalmic suspension Place 1 drop into the right eye 2 (two) times daily.    timolol (BETIMOL) 0.5 % ophthalmic solution Place 1 drop into both eyes 2 (two) times daily.       No Known Allergies Follow-up Information    Follow up with [facility] On 09/20/2014.   Why:  10:30 am   Contact information:   [address] [place] [address] [phone]       The results of significant diagnostics from this hospitalization (including imaging, microbiology, ancillary and laboratory) are listed below for reference.    Significant Diagnostic Studies: Dg Chest 2 View  09/15/2014   CLINICAL DATA:  Cough, shortness breath, congestion  EXAM: CHEST - 2 VIEW  COMPARISON:  the previous day's study  FINDINGS: Worsening patchy airspace disease in the posterior right lower lobe. Left lung remains clear. Heart size normal. No effusion.  No pneumothorax. Visualized skeletal structures are unremarkable.  IMPRESSION: 1. Worsening posterior right lower lobe pneumonia.   Electronically Signed   By: Lucrezia Europe M.D.   On: 09/15/2014 09:11   Dg Chest 2 View  09/14/2014   CLINICAL DATA:  Chest pain shortness of breath cough for 2 days history of hypertension and asthma  EXAM: CHEST  2 VIEW  COMPARISON:  09/11/2014  FINDINGS: Heart size and vascular pattern are normal. There is hyperinflation consistent with COPD. Stable lateral bronchitic change.  Mildly increased right lower lobe opacity. Possible pulmonary nodule measuring about 5 mm over the lateral left lower lobe, verse is a nipple shadow.  IMPRESSION: COPD with possible right lower lobe developing infiltrate.  Nipple shadow versus pulmonary nodule on the left. Consider repeating the PA view with nipple markers.   Electronically Signed   By: Skipper Cliche M.D.   On: 09/14/2014 14:27   Dg Chest 2 View  09/11/2014   CLINICAL DATA:  Shortness of breath and asthma like symptoms for 10 days.  EXAM: CHEST  2 VIEW  COMPARISON:  Chest radiograph 09/05/2014  FINDINGS: Stable cardiac and mediastinal contours with tortuosity of the thoracic aorta. No consolidative pulmonary opacities. No pleural effusion or pneumothorax. Regional skeleton is unremarkable. Probable small eventration left hemidiaphragm.  IMPRESSION: No acute cardiopulmonary process.   Electronically Signed   By: Lovey Newcomer M.D.   On: 09/11/2014 14:25   Dg Chest 2 View  09/06/2014   ADDENDUM REPORT: 09/06/2014 10:43  ADDENDUM: Comparison is now available with prior study of 05/26/2013.  Emphysematous changes appear stable.  Eventration at the LEFT diaphragm was also present on the previous exam.  No new abnormalities identified.  Electronically Signed   By: Lavonia Dana M.D.   On: 09/06/2014 10:43   09/06/2014   CLINICAL DATA:  Chest pain and cough for 3 days, history asthma  EXAM: CHEST  2 VIEW  COMPARISON:  This exam was interpreted during a PACS downtime with limited availability of comparison cases. It has been flagged for review following the downtime. If clinically indicated after this review, an addendum will be issued providing details about comparison to prior imaging.  FINDINGS: Normal heart size, mediastinal contours, and pulmonary vascularity.  Lungs hyperinflated but clear.  No pleural effusion or pneumothorax.  Probable small eventration at LEFT diaphragm anteriorly with bowel beneath.  No acute osseous findings.  IMPRESSION:  Question emphysematous changes.  Probable small eventration at LEFT diaphragm.  Electronically Signed: By: Lavonia Dana M.D. On: 09/05/2014 16:18    Microbiology: Recent Results (from the past 240 hour(s))  Culture, expectorated sputum-assessment     Status: None   Collection Time: 09/15/14  7:54 AM  Result Value Ref Range Status   Specimen Description SPUTUM  Final   Special Requests NONE  Final   Sputum evaluation   Final    MICROSCOPIC FINDINGS SUGGEST THAT THIS SPECIMEN IS NOT REPRESENTATIVE OF LOWER RESPIRATORY SECRETIONS. PLEASE RECOLLECT. Gram Stain Report Called to,Read Back By and Verified With: TIFFANY,RN AT 1000 09/15/14 BY K BARR    Report Status 09/16/2014 FINAL  Final     Labs: Basic Metabolic Panel:  Recent Labs Lab 09/11/14 1510 09/14/14 1402 09/15/14 0705  NA 139 136 136  K 3.9 4.3 4.1  CL 104 100 100  CO2 29 29 27   GLUCOSE 92 105* 132*  BUN 22 15 19   CREATININE 1.09 1.22 1.51*  CALCIUM 8.6 9.0 8.8   Liver Function Tests: No results for input(s): AST, ALT, ALKPHOS, BILITOT, PROT, ALBUMIN in the last 168 hours. No results for input(s): LIPASE, AMYLASE in the last 168 hours. No results for input(s): AMMONIA in the last 168 hours. CBC:  Recent Labs Lab 09/11/14 1510 09/14/14 1402 09/15/14 0705  WBC 6.9 9.0 14.8*  NEUTROABS 4.2 7.3  --   HGB 13.9 14.0 13.1  HCT 42.7 41.6 37.0*  MCV 86.4 89.1 89.2  PLT 259 214 204   Cardiac Enzymes:  Recent Labs Lab 09/14/14 2240 09/15/14 0705 09/15/14 1451 09/15/14 2036 09/16/14 0420  TROPONINI <0.03 0.08* 0.04* <0.03 0.03   BNP: BNP (last 3 results)  Recent Labs  09/05/14 1617  BNP 31.2    ProBNP (last 3 results) No results for input(s): PROBNP in the last 8760 hours.  CBG: No results for input(s): GLUCAP in the last 168 hours.     SignedLouellen Molder  [facility] 09/16/2014, 3:26 PM

## 2014-09-20 ENCOUNTER — Inpatient Hospital Stay: Payer: Self-pay | Admitting: Family Medicine

## 2014-09-22 ENCOUNTER — Encounter: Payer: Self-pay | Admitting: Family Medicine

## 2014-09-22 ENCOUNTER — Ambulatory Visit: Payer: MEDICAID | Attending: Family Medicine | Admitting: Family Medicine

## 2014-09-22 VITALS — BP 124/85 | HR 91 | Temp 98.4°F | Resp 20 | Ht 71.0 in | Wt 185.6 lb

## 2014-09-22 DIAGNOSIS — J441 Chronic obstructive pulmonary disease with (acute) exacerbation: Secondary | ICD-10-CM

## 2014-09-22 DIAGNOSIS — I10 Essential (primary) hypertension: Secondary | ICD-10-CM | POA: Insufficient documentation

## 2014-09-22 DIAGNOSIS — J189 Pneumonia, unspecified organism: Secondary | ICD-10-CM | POA: Insufficient documentation

## 2014-09-22 MED ORDER — ALBUTEROL SULFATE HFA 108 (90 BASE) MCG/ACT IN AERS: 2.0000 | INHALATION_SPRAY | RESPIRATORY_TRACT | Status: DC | PRN

## 2014-09-22 MED ORDER — AMLODIPINE BESYLATE 5 MG PO TABS: 5.0000 mg | ORAL_TABLET | Freq: Every day | ORAL | Status: DC

## 2014-09-22 MED ORDER — FEXOFENADINE HCL 30 MG PO TABS: 30.0000 mg | ORAL_TABLET | Freq: Every day | ORAL | Status: DC | PRN

## 2014-09-22 NOTE — Patient Instructions (Signed)

## 2014-09-22 NOTE — Progress Notes (Signed)
Subjective:    Patient ID: Bryan Sullivan, male    DOB: 26-Jan-1946, 69 y.o.   MRN: [id number]  HPI  Admit Date: 09/14/14 Discharge Date: 09/16/14  Bryan Sullivan is a 69 year old male with a history of Hypertension , Asthma who is seen with the aid of an Fish farm manager from [employer] and is in the company of his son. The patient understands English to a great extent. He presented to [facility] ED with dyspnea on exertion for 10 days associated with cough and pleuritic chest pain. He had been seen in the ED twice recently for the same symptoms and discharged home on prednisone and nebulizer. Patient returned to the ED with recurrence of symptoms. Chest x-ray in the ED showed COPD changes with right lower lobe infiltrate. Patient admitted to telemetry for community acquired pneumonia/COPD exacerbation. Also had mildly elevated troponins which peaked at 0.08 and subsequently trended down; EKG revealed sinus tachycardia with PVCs. 2d echo during hospitalizaton which revealed EF of 50-55% and grade 1 diastolic dysfunction. He was placed on steroids, Levaquin and nebulizer treatments and his condition improved prior to discharge.  Interval History: Completed a course of Levaquin but complains of cough productive of whitish sputum and he also feels like he has phlegm in his throat; cough is worse at night any short of breath when he lies down. He has not been using Qvar as prescribed and uses only as needed as he stated it is not effective but then he uses the Proventil very often.  He has a PCP in [place] whom he sees whenever he goes to visit his son there  Review of Systems  General: negative for fever, weight loss, appetite change Eyes: no visual symptoms. ENT: no ear symptoms, no sinus tenderness, no nasal congestion or sore throat. Neck: no pain  Respiratory: no wheezing, has shortness of breath,has cough Cardiovascular: no chest pain, no dyspnea on exertion, no pedal  edema, has orthopnea. Gastrointestinal: no abdominal pain, no diarrhea, no constipation Genito-Urinary: no urinary frequency, no dysuria, no polyuria. Hematologic: no bruising Endocrine: no cold or heat intolerance Neurological: no headaches, no seizures, no tremors Musculoskeletal: no joint pains, no joint swelling Skin: no pruritus, no rash. Psychological: no depression, no anxiety,       Objective: Filed Vitals:   09/22/14 1614  BP: 124/85  Pulse: 91  Temp: 98.4 F (36.9 C)  Resp: 20      Physical Exam  Constitutional: normal appearing,  Eyes: PERRLA HEENT: Head is atraumatic, normal sinuses, normal oropharynx, normal appearing tonsils and palate, tympanic membrane is normal bilaterally. Neck: normal range of motion, no thyromegaly, no JVD Cardiovascular: normal rate and rhythm, normal heart sounds, no murmurs, rub or gallop, no pedal edema Respiratory: wheezing on left lower lobe, right is normal Abdomen: soft, not tender to palpation, normal bowel sounds, no enlarged organs Extremities: Full ROM, no tenderness in joints Skin: warm and dry, no lesions. Neurological: alert, oriented x3, cranial nerves I-XII grossly intact , normal motor strength, normal sensation. Psychological: normal mood.   EXAM: CHEST - 2 VIEW  COMPARISON: the previous day's study  FINDINGS: Worsening patchy airspace disease in the posterior right lower lobe. Left lung remains clear. Heart size normal. No effusion. No pneumothorax. Visualized skeletal structures are unremarkable.  IMPRESSION: 1. Worsening posterior right lower lobe pneumonia.   Electronically Signed  By: Lucrezia Europe M.D.  On: 09/15/2014 09:11      Assessment & Plan:  69 year old male with a history  of hypertension and asthma recently managed for right lower lobe pneumonia and COPD exacerbation.  Community-acquired pneumonia: He has completed a course of Levaquin. Might need repeat CXR at next office visit  to reasses for complete resolution  Asthma/COPD exacerbation: Still symptomatic; cannot exclude seasonal component and so I have advised him to continue his Allegra. Instructed on proper administration of medications and he is to use the Qvar as a controller medication and Proventil as a rescue. Will need to be reassessed at the next office visit for initiation of Advair if symptoms persist.   Hypertension: Controlled He will need fasting lipids at his next office visit.

## 2014-09-22 NOTE — Progress Notes (Signed)
Patient hospitalized for 3 days last week for difficulty breathing. Son in room with patient & Interpretor. Patient reports the doctors at the hospital suspected pneumonia, son reports they could not confirm pneumonia.

## 2014-10-11 ENCOUNTER — Ambulatory Visit: Payer: Self-pay | Attending: Family Medicine | Admitting: Family Medicine

## 2014-10-11 ENCOUNTER — Ambulatory Visit (HOSPITAL_COMMUNITY)
Admission: RE | Admit: 2014-10-11 | Discharge: 2014-10-11 | Disposition: A | Discharge status: Home / Self Care | Payer: Self-pay | Source: Ambulatory Visit | Attending: Family Medicine | Admitting: Family Medicine

## 2014-10-11 ENCOUNTER — Encounter: Payer: Self-pay | Admitting: Family Medicine

## 2014-10-11 VITALS — BP 137/73 | HR 74 | Temp 98.4°F | Resp 18 | Ht 70.0 in | Wt 188.0 lb

## 2014-10-11 DIAGNOSIS — J189 Pneumonia, unspecified organism: Secondary | ICD-10-CM | POA: Insufficient documentation

## 2014-10-11 DIAGNOSIS — J449 Chronic obstructive pulmonary disease, unspecified: Secondary | ICD-10-CM | POA: Insufficient documentation

## 2014-10-11 DIAGNOSIS — J4530 Mild persistent asthma, uncomplicated: Secondary | ICD-10-CM

## 2014-10-11 DIAGNOSIS — J45909 Unspecified asthma, uncomplicated: Secondary | ICD-10-CM | POA: Insufficient documentation

## 2014-10-11 DIAGNOSIS — I10 Essential (primary) hypertension: Secondary | ICD-10-CM | POA: Insufficient documentation

## 2014-10-11 DIAGNOSIS — M62830 Muscle spasm of back: Secondary | ICD-10-CM | POA: Insufficient documentation

## 2014-10-11 DIAGNOSIS — Z947 Corneal transplant status: Secondary | ICD-10-CM | POA: Insufficient documentation

## 2014-10-11 DIAGNOSIS — J441 Chronic obstructive pulmonary disease with (acute) exacerbation: Secondary | ICD-10-CM

## 2014-10-11 DIAGNOSIS — Z8701 Personal history of pneumonia (recurrent): Secondary | ICD-10-CM | POA: Insufficient documentation

## 2014-10-11 DIAGNOSIS — Z87891 Personal history of nicotine dependence: Secondary | ICD-10-CM | POA: Insufficient documentation

## 2014-10-11 LAB — COMPREHENSIVE METABOLIC PANEL
ALT: 17 U/L (ref 0–53)
AST: 13 U/L (ref 0–37)
Albumin: 3.8 g/dL (ref 3.5–5.2)
Alkaline Phosphatase: 63 U/L (ref 39–117)
BUN: 19 mg/dL (ref 6–23)
CO2: 27 mEq/L (ref 19–32)
Calcium: 9 mg/dL (ref 8.4–10.5)
Chloride: 103 mEq/L (ref 96–112)
Creat: 1.04 mg/dL (ref 0.50–1.35)
Glucose, Bld: 97 mg/dL (ref 70–99)
Potassium: 4.5 mEq/L (ref 3.5–5.3)
Sodium: 140 mEq/L (ref 135–145)
Total Bilirubin: 0.5 mg/dL (ref 0.2–1.2)
Total Protein: 6.7 g/dL (ref 6.0–8.3)

## 2014-10-11 LAB — LIPID PANEL
Cholesterol: 176 mg/dL (ref 0–200)
HDL: 29 mg/dL — ABNORMAL LOW (ref >=40)
LDL Cholesterol: 127 mg/dL — ABNORMAL HIGH (ref 0–99)
Total CHOL/HDL Ratio: 6.1 Ratio
Triglycerides: 102 mg/dL (ref <150)
VLDL: 20 mg/dL (ref 0–40)

## 2014-10-11 MED ORDER — AMLODIPINE BESYLATE 5 MG PO TABS: 5.0000 mg | ORAL_TABLET | Freq: Every day | ORAL | Status: DC

## 2014-10-11 MED ORDER — BECLOMETHASONE DIPROPIONATE 80 MCG/ACT IN AERS: 2.0000 | INHALATION_SPRAY | Freq: Two times a day (BID) | RESPIRATORY_TRACT | Status: DC

## 2014-10-11 MED ORDER — ALBUTEROL SULFATE HFA 108 (90 BASE) MCG/ACT IN AERS: 2.0000 | INHALATION_SPRAY | RESPIRATORY_TRACT | Status: DC | PRN

## 2014-10-11 NOTE — Patient Instructions (Signed)

## 2014-10-11 NOTE — Progress Notes (Signed)
Patient here for follow up for breathing and cholesterol. Patient denies pain at this time.

## 2014-10-11 NOTE — Progress Notes (Signed)
Subjective:    Patient ID: Bryan Sullivan, male    DOB: 29-Jun-1945, 69 y.o.   MRN: [id number]  HPI  Bryan Sullivan was seen at his last visit for hospital follow-up on community-acquired pneumonia and COPD exacerbation and has completed a course of Levaquin. At that time he had admitted to using albuterol as his controller medication and Qvar as needed with resulting poor control of symptoms. He did have wheezing and his chest x-ray from hospitalization had revealed a posterior right lower lobe pneumonia.   Interval History: He reports feeling better and his breathing has improved. He is using his Qvar and albuterol as prescribed. He complains of intermittent mid back pain which is not associated with activity, pain is absent at this time and does not radiate but he describes it as a spasm. Does not take any OTC medications. Currently fasting in anticipation of blood work today.  Past Medical History  Diagnosis Date  . Asthma   . Hypertension   . Enlarged prostate     Past Surgical History  Procedure Laterality Date  . Eye surgery    . Cataract extraction w/ intraocular lens  implant, bilateral Bilateral   . Corneal transplant Right     History reviewed. No pertinent family history.  History   Social History  . Marital Status: Married    Spouse Name: N/A  . Number of Children: N/A  . Years of Education: N/A   Occupational History  . Not on file.   Social History Main Topics  . Smoking status: Former Smoker    Types: Cigarettes    Quit date: 09/21/1968  . Smokeless tobacco: Never Used     Comment: "quit smoking cigarettes in 1970"  . Alcohol Use: No  . Drug Use: No  . Sexual Activity: Not on file   Other Topics Concern  . Not on file   Social History Narrative    No Known Allergies  Current Outpatient Prescriptions on File Prior to Visit  Medication Sig Dispense Refill  . brimonidine (ALPHAGAN) 0.2 % ophthalmic solution Place 1 drop into the right eye 3  (three) times daily.    . dorzolamide (TRUSOPT) 2 % ophthalmic solution Place 1 drop into the right eye 3 (three) times daily.    . fexofenadine (ALLEGRA) 30 MG tablet Take 1 tablet (30 mg total) by mouth daily as needed (allergies). 30 tablet 1  . guaiFENesin-dextromethorphan (ROBITUSSIN DM) 100-10 MG/5ML syrup Take 5 mLs by mouth every 4 (four) hours as needed for cough. 118 mL 0  . latanoprost (XALATAN) 0.005 % ophthalmic solution Place 1 drop into the right eye at bedtime.    . prednisoLONE acetate (PRED FORTE) 1 % ophthalmic suspension Place 1 drop into the right eye 2 (two) times daily.    . timolol (BETIMOL) 0.5 % ophthalmic solution Place 1 drop into both eyes 2 (two) times daily.    Marland Kitchen ipratropium (ATROVENT HFA) 17 MCG/ACT inhaler Inhale 2 puffs into the lungs 3 (three) times daily. (Patient not taking: Reported on 09/22/2014) 1 Inhaler 12   No current facility-administered medications on file prior to visit.       Review of Systems  Constitutional: Negative for activity change and appetite change.  HENT: Negative for sinus pressure and sore throat.   Eyes: Negative for visual disturbance.  Respiratory: Negative for chest tightness and shortness of breath.   Cardiovascular: Negative for chest pain and palpitations.  Gastrointestinal: Negative for abdominal pain and abdominal distention.  Endocrine:  Negative for cold intolerance, heat intolerance and polyphagia.  Genitourinary: Negative for dysuria, frequency and difficulty urinating.  Musculoskeletal: Negative for back pain, joint swelling and arthralgias.       See HPI  Skin: Negative for color change.  Neurological: Negative for dizziness, tremors and weakness.         Objective: Filed Vitals:   10/11/14 0920  BP: 137/73  Pulse: 74  Temp: 98.4 F (36.9 C)  TempSrc: Oral  Resp: 18  Height: 5\' 10"  (1.778 m)  Weight: 188 lb (85.276 kg)  SpO2: 97%      Physical Exam  Constitutional: He is oriented to person, place,  and time. He appears well-developed and well-nourished.  HENT:  Head: Normocephalic and atraumatic.  Right Ear: External ear normal.  Left Ear: External ear normal.  Eyes: Conjunctivae and EOM are normal. Pupils are equal, round, and reactive to light.  Neck: No tracheal deviation present.  Cardiovascular: Normal rate, regular rhythm and normal heart sounds.   No murmur heard. Pulmonary/Chest: Effort normal and breath sounds normal. No respiratory distress. He has no wheezes. He exhibits no tenderness.  Abdominal: Soft. Bowel sounds are normal. He exhibits no mass. There is no tenderness.  Musculoskeletal: Normal range of motion. He exhibits no edema or tenderness.  Neurological: He is alert and oriented to person, place, and time.  Psychiatric: He has a normal mood and affect.          Assessment & Plan:  69 year old male patient with a history of hypertension, asthma, COPD, recently seen for a hospital follow-up for community-acquired pneumonia and has completed his course of therapy and is here for follow-up.   Asthma: Controlled symptomatically but his peak flows are low at 140, 160, and 170 L/minute Poor effort or poor technique could be an explanation for this but he would need to repeat that at next visit; meanwhile I am keeping him on his current controller medications.  COPD: Community-acquired pneumonia: He has completed his course of antibiotics and I am sending him for a chest x-ray to evaluate for complete resolution.   Hypertension: Controlled. continue medications.  Muscle spasm: Advised to use heating pads I'm holding off on muscle relaxants due to the fact that he is a geriatric patient but if symptoms persist I would have to place him on a low-dose.  Disclaimer: This note was dictated with voice recognition software. Similar sounding words can inadvertently be transcribed and this note may contain transcription errors which may not have been corrected upon  publication of note.

## 2014-10-12 NOTE — Progress Notes (Signed)
Quick Note:  Please inform the patient that labs are normal. Thank you. ______

## 2014-10-13 ENCOUNTER — Telehealth: Payer: Self-pay

## 2014-10-13 NOTE — Telephone Encounter (Signed)
Nurse called patient, patient verified date of birth. Patient aware of normal cholesterol, blood glucose and renal function. Patient aware of need for low cholesterol diet and exercise due to mildly elevated LDL. Patient also aware of normal chest xray.

## 2014-10-13 NOTE — Telephone Encounter (Signed)
-----   Message from Arnoldo Morale, MD sent at 10/12/2014  8:48 AM EDT ----- Total cholesterol is normal and so is his blood glucose and renal function; LDL is mildly elevated and he is advised to work on low cholesterol diet and exercise as tolerated.

## 2014-10-18 ENCOUNTER — Ambulatory Visit: Payer: Self-pay | Attending: Family Medicine

## 2014-11-15 ENCOUNTER — Ambulatory Visit: Payer: Self-pay | Attending: Internal Medicine

## 2014-11-15 ENCOUNTER — Ambulatory Visit: Payer: Self-pay

## 2014-11-24 ENCOUNTER — Ambulatory Visit (HOSPITAL_COMMUNITY)
Admission: RE | Admit: 2014-11-24 | Discharge: 2014-11-24 | Disposition: A | Discharge status: Home / Self Care | Payer: Medicaid Other | Source: Ambulatory Visit | Attending: Family Medicine | Admitting: Family Medicine

## 2014-11-24 ENCOUNTER — Ambulatory Visit: Payer: Medicaid Other | Attending: Family Medicine | Admitting: Family Medicine

## 2014-11-24 ENCOUNTER — Encounter: Payer: Self-pay | Admitting: Family Medicine

## 2014-11-24 VITALS — BP 149/84 | HR 70 | Temp 97.9°F | Resp 18 | Ht 70.0 in | Wt 195.0 lb

## 2014-11-24 DIAGNOSIS — N400 Enlarged prostate without lower urinary tract symptoms: Secondary | ICD-10-CM | POA: Insufficient documentation

## 2014-11-24 DIAGNOSIS — M1710 Unilateral primary osteoarthritis, unspecified knee: Secondary | ICD-10-CM | POA: Insufficient documentation

## 2014-11-24 DIAGNOSIS — J449 Chronic obstructive pulmonary disease, unspecified: Secondary | ICD-10-CM | POA: Insufficient documentation

## 2014-11-24 DIAGNOSIS — M170 Bilateral primary osteoarthritis of knee: Secondary | ICD-10-CM

## 2014-11-24 DIAGNOSIS — M179 Osteoarthritis of knee, unspecified: Secondary | ICD-10-CM | POA: Insufficient documentation

## 2014-11-24 LAB — HEMOCCULT GUIAC POC 1CARD (OFFICE): Fecal Occult Blood, POC: NEGATIVE

## 2014-11-24 MED ORDER — IBUPROFEN 600 MG PO TABS: 600.0000 mg | ORAL_TABLET | Freq: Three times a day (TID) | ORAL | Status: DC | PRN

## 2014-11-24 MED ORDER — TAMSULOSIN HCL 0.4 MG PO CAPS: 0.4000 mg | ORAL_CAPSULE | Freq: Every day | ORAL | Status: DC

## 2014-11-24 NOTE — Progress Notes (Signed)
   Subjective:    Patient ID: Bryan Sullivan, male    DOB: Jun 04, 1945, 69 y.o.   MRN: [id number] 69 yo M presents to meet PCP CC: COPD, knee pain, prostate check  HPI  1. COPD/asthma: no cough, CP or SOB. Using QVAR and is happy with it. No fever.   2. Knee pain: has knee pain x 10 yrs. Both knees R>L. R knee gets locked. No falls. Takes tylenol as needed for pain. Has had PT in the past.   3. Prostate: has hx of enlarged prostate d in 2006. Urinates 3-4 times at night. Has weak stream. Has trouble initiating stream. No dysuria.   Soc Hx: former smoker, quit  Review of Systems  Respiratory: Negative for cough, chest tightness and shortness of breath.   Cardiovascular: Negative for chest pain.      Objective:   Physical Exam BP 149/84 mmHg  Pulse 70  Temp(Src) 97.9 F (36.6 C) (Oral)  Resp 18  Ht 5\' 10"  (1.778 m)  Wt 195 lb (88.451 kg)  BMI 27.98 kg/m2  SpO2 99% General appearance: alert, cooperative, appears stated age and no distress Lungs: clear to auscultation bilaterally Heart: regular rate and rhythm, S1, S2 normal, no murmur, click, rub or gallop Rectal: soft brown guaiac negative stool noted and the prostate is enlarged   MSK: knees with stiffness and mild crepitus, mild joint deformity no erythema or effusion       Assessment & Plan:

## 2014-11-24 NOTE — Progress Notes (Signed)
Establish care Hx Asthma / No tobacco  Complaining of Knee pain, no Hx injury

## 2014-11-24 NOTE — Assessment & Plan Note (Signed)
Enlarged prostate  Enlarged prostate with weak stream and frequent urine at night Start flomax 0.4 mg daily  Be careful when rising from sitting or standing as flomax can lower BP PSA today

## 2014-11-24 NOTE — Assessment & Plan Note (Signed)
Asthma/COPD: Well controlled Continue current regimen of QVAR controller medicine

## 2014-11-24 NOTE — Patient Instructions (Addendum)
Mr. Stineman,  Thank you for coming in today  1. Asthma/COPD: Well controlled Continue current regimen  2. Enlarged prostate  Enlarged prostate with weak stream and frequent urine at night Start flomax 0.4 mg daily  Be careful when rising from sitting or standing as flomax can lower BP PSA today  3. Knee pain:  Suspect osteoarthritis also known at  Degenerative joint disease or DJD X-rays ordered Tylenol for moderate pain Diclofenac 75 mg 1-2 times a day as needed for severe pain, use sparingly If you feel weak in your leg get a can to use on the same side as the weakest leg Physical therapy referral  F/u in 6 weeks for enlarged prostate   Dr. Adrian Blackwater

## 2014-11-24 NOTE — Assessment & Plan Note (Signed)
Knee pain:  Suspect osteoarthritis also known at  Degenerative joint disease or DJD X-rays ordered Tylenol for moderate pain Diclofenac 75 mg 1-2 times a day as needed for severe pain, use sparingly If you feel weak in your leg get a can to use on the same side as the weakest leg Physical therapy referral

## 2014-11-25 LAB — PSA: PSA: 3.92 ng/mL (ref <=4.00)

## 2014-11-30 ENCOUNTER — Telehealth: Payer: Self-pay | Admitting: *Deleted

## 2014-11-30 NOTE — Telephone Encounter (Signed)
-----   Message from Boykin Nearing, MD sent at 11/25/2014  8:54 AM EDT ----- Normal PSA  Continue flomax, no urology referral needed

## 2014-11-30 NOTE — Telephone Encounter (Signed)
-----   Message from Boykin Nearing, MD sent at 11/25/2014  8:51 AM EDT ----- Mild DJD both knees  Evidence of possible R MCL injury in the past  Continue current care plan

## 2014-11-30 NOTE — Telephone Encounter (Signed)
Pt aware of results 

## 2014-12-07 ENCOUNTER — Ambulatory Visit: Payer: Medicaid Other | Admitting: Physical Therapy

## 2014-12-10 ENCOUNTER — Ambulatory Visit: Payer: Medicaid Other | Attending: Family Medicine

## 2014-12-10 ENCOUNTER — Other Ambulatory Visit: Payer: Self-pay | Admitting: Internal Medicine

## 2014-12-10 DIAGNOSIS — R262 Difficulty in walking, not elsewhere classified: Secondary | ICD-10-CM

## 2014-12-10 DIAGNOSIS — M25562 Pain in left knee: Secondary | ICD-10-CM | POA: Insufficient documentation

## 2014-12-10 DIAGNOSIS — M25561 Pain in right knee: Secondary | ICD-10-CM

## 2014-12-10 MED ORDER — BECLOMETHASONE DIPROPIONATE 80 MCG/ACT IN AERS
2.0000 | INHALATION_SPRAY | Freq: Two times a day (BID) | RESPIRATORY_TRACT | Status: DC
Start: 2014-12-10 — End: 2015-07-14

## 2014-12-10 MED ORDER — ALBUTEROL SULFATE HFA 108 (90 BASE) MCG/ACT IN AERS: 2.0000 | INHALATION_SPRAY | RESPIRATORY_TRACT | Status: DC | PRN

## 2014-12-10 NOTE — Therapy (Signed)
[place], [place], [address] Phone: [phone]   Fax:  [phone]  Physical Therapy Evaluation  Patient Details  Name: Bryan Sullivan MRN: [id number] Date of Birth: Sep 23, 1945 Referring Provider:  Boykin Nearing, MD  Encounter Date: 12/10/2014      PT End of Session - 12/10/14 0917    Visit Number 1   Number of Visits 1   Authorization Type Medicaid   PT Start Time 0850   PT Stop Time 0900   PT Time Calculation (min) 10 min   Activity Tolerance Patient tolerated treatment well   Behavior During Therapy [facility] for tasks assessed/performed      Past Medical History  Diagnosis Date  . Asthma     As child   . Hypertension     Dx at age 34  . Enlarged prostate Dx 2001    Past Surgical History  Procedure Laterality Date  . Eye surgery    . Cataract extraction w/ intraocular lens  implant, bilateral Bilateral   . Corneal transplant Right     There were no vitals filed for this visit.  Visit Diagnosis:  Knee pain, bilateral - Plan: PT plan of care cert/re-cert  Difficulty walking - Plan: PT plan of care cert/re-cert      Subjective Assessment - 12/10/14 0854    Subjective Pain in both knees,  He has medicattion for knee pain. He reports 10 years of knee pain. He reports playing footbal when younger. He feels RT knee is unstable at times   Patient is accompained by: Family member   Limitations Walking   How long can you sit comfortably? As needed   How long can you walk comfortably? 30 min   Diagnostic tests X ray: DJD with possible MCL tear   Pain Score 6   sitting in RT knee no pain   Pain Location Knee   Pain Orientation Left   Pain Descriptors / Indicators Aching   Pain Type Chronic pain   Pain Onset More than a month ago   Pain Frequency Intermittent   Aggravating Factors  Walking   Pain Relieving Factors Move around   Multiple Pain Sites No            [facility] PT  Assessment - 12/10/14 0905    Assessment   Medical Diagnosis OA both knees   Onset Date/Surgical Date --  5-10 years ago   Next MD Visit 12/16/14   Precautions   Precautions None   Restrictions   Weight Bearing Restrictions No   Balance Screen   Has the patient fallen in the past 6 months No   Prior Function   Level of Independence Independent   Cognition   Overall Cognitive Status Within Functional Limits for tasks assessed   ROM / Strength   AROM / PROM / Strength AROM;Strength   AROM   Overall AROM Comments Hip and knee active range WNL bilaterally   AROM Assessment Site Hip;Knee   Strength   Overall Strength Comments Hip and knee strength bilaterally ar normal.    Flexibility   Soft Tissue Assessment /Muscle Length yes   Hamstrings 65-70 degrees bilaterally   Palpation   Palpation comment some laxity noted RT knee medial to lateral   Ambulation/Gait   Gait Comments Normal                           PT Education - 12/10/14 0915  Education provided Yes   Education Details HEP, Medicaid limits   Person(s) Educated Patient;Child(ren)   Methods Explanation;Demonstration;Verbal cues;Handout   Comprehension Verbalized understanding;Returned demonstration                    Plan - 12/10/14 0917    Clinical Impression Statement Medicaid does not pay for PT for his diagnosis. He was issued a HEP for quad and hip strength. e may have some laxity of RT knee giving him a feeling of instability. A brace may be of some benefit .    PT Next Visit Plan No follow up as Medicaid does not pay for PT for OA of knees   PT Home Exercise Plan HEP   Consulted and Agree with Plan of Care Patient         Problem List Patient Active Problem List   Diagnosis Date Noted  . BPH (benign prostatic hyperplasia) 11/24/2014  . DJD (degenerative joint disease) of knee 11/24/2014  . Chronic obstructive airway disease with asthma 10/11/2014  . Hypertension  09/22/2014  . Elevated troponin 09/16/2014    Darrel Hoover PT 12/10/2014, 10:06 AM  [facility] [address] [place], [place], [address] Phone: [phone]   Fax:  [phone]

## 2014-12-10 NOTE — Patient Instructions (Addendum)
Hip Extension: Hamstring Single Leg Deadlift (Eccentric)   Holding weights, stand on affected leg with knee slightly flexed. Lift other leg while slowly bending forward at the hip. Use ___ lb weight. ___ reps per set, ___ sets per day, ___ days per week. Add ___ lbs when you achieve ___ repetitions. Touch floor with weight.  Copyright  VHI. All rights reserved.  Squat: Wide Leg   Feet wide apart, toes out, squat, holding weight between knees. Weight should be at ankles.  Bend at the hips with hips going back behind hips. Keep back straight.  Put eight on stool if you cannot get to floor.  Repeat _3-15___ times per set. Do ___1-2_ sets per session. Do __2-5__ sessions per week. Use __0-10__ lb weight.  Copyright  VHI. All rights reserved.  Squat: Half   Arms hanging at sides, squat by dropping hips back as if sitting on a chair. Keep knees over ankles, hips behind heels.  Keep back straight.  Bend at hips and knees will bend with hips.   Repeat ___3-15_ times per set. Do __1-2__ sets per session. Do __2-5__ sessions per week. Use __0-10HIP / KNEE: Flexion / Extension, Squat Unilateral Hip / Glute Extension: Standing - Straight Leg (Machine) Hip Extension (Standing) Healthy Back - Yoga Tree Balance Also issued wall sits and SAQ, LAQ with weight 3 pounds to be purchsed and 1020 reps 4-5x/week with caution to limit range or load as pain dictates and try cold or heat for 2-3 weeks to see if helpful

## 2014-12-16 ENCOUNTER — Ambulatory Visit: Payer: Self-pay | Attending: Family Medicine | Admitting: Family Medicine

## 2014-12-16 ENCOUNTER — Encounter: Payer: Self-pay | Admitting: Family Medicine

## 2014-12-16 VITALS — BP 145/79 | HR 76 | Temp 98.3°F | Resp 16 | Ht 70.0 in | Wt 192.0 lb

## 2014-12-16 DIAGNOSIS — N400 Enlarged prostate without lower urinary tract symptoms: Secondary | ICD-10-CM

## 2014-12-16 DIAGNOSIS — H409 Unspecified glaucoma: Secondary | ICD-10-CM | POA: Insufficient documentation

## 2014-12-16 DIAGNOSIS — Z0000 Encounter for general adult medical examination without abnormal findings: Secondary | ICD-10-CM | POA: Insufficient documentation

## 2014-12-16 DIAGNOSIS — I10 Essential (primary) hypertension: Secondary | ICD-10-CM

## 2014-12-16 DIAGNOSIS — M545 Low back pain, unspecified: Secondary | ICD-10-CM | POA: Insufficient documentation

## 2014-12-16 DIAGNOSIS — R55 Syncope and collapse: Secondary | ICD-10-CM

## 2014-12-16 LAB — GLUCOSE, POCT (MANUAL RESULT ENTRY): POC Glucose: 86 mg/dl (ref 70–99)

## 2014-12-16 MED ORDER — DORZOLAMIDE HCL 2 % OP SOLN: 1.0000 [drp] | Freq: Three times a day (TID) | OPHTHALMIC | Status: DC

## 2014-12-16 MED ORDER — BRIMONIDINE TARTRATE 0.2 % OP SOLN: 1.0000 [drp] | Freq: Three times a day (TID) | OPHTHALMIC | Status: DC

## 2014-12-16 MED ORDER — PREDNISOLONE ACETATE 1 % OP SUSP: 1.0000 [drp] | Freq: Two times a day (BID) | OPHTHALMIC | Status: DC

## 2014-12-16 MED ORDER — TIMOLOL HEMIHYDRATE 0.5 % OP SOLN: 1.0000 [drp] | Freq: Two times a day (BID) | OPHTHALMIC | Status: DC

## 2014-12-16 MED ORDER — LATANOPROST 0.005 % OP SOLN: 1.0000 [drp] | Freq: Every day | OPHTHALMIC | Status: DC

## 2014-12-16 NOTE — Assessment & Plan Note (Signed)
A; BPH with symptomatic improvement P: Continue nightly flomax

## 2014-12-16 NOTE — Assessment & Plan Note (Signed)
Low back pain: Muscle spasm Drink plenty of water Tonic water also helps Tylenol 650 mg up to 3 times a day first for pain Add ibuprofen 200-400 mg up to 3 times a day as needed if tylenol does not give enough relief

## 2014-12-16 NOTE — Progress Notes (Signed)
F/U prostate  Stated was in the ER on Tuesday due to low blood pressure  Dizzy, nausea.

## 2014-12-16 NOTE — Assessment & Plan Note (Signed)
Glaucoma: I have reodered the medications prescribed by Dr. Arlis Porta to the pharmacy on site

## 2014-12-16 NOTE — Patient Instructions (Signed)
Mr. Nicolls,  Thank you for coming in today  1. Glaucoma: I have reodered the medications prescribed by Dr. Arlis Porta to the pharmacy on site  2. Pre syncope: Appears to be related to poor intake Normal blood sugar today If it happens again please return for evaluation   3. Low back pain: Muscle spasm Drink plenty of water Tonic water also helps Tylenol 650 mg up to 3 times a day first for pain Add ibuprofen 200-400 mg up to 3 times a day as needed if tylenol does not give enough relief  F/u in 3 months   Dr. Adrian Blackwater

## 2014-12-16 NOTE — Progress Notes (Signed)
   Subjective:    Patient ID: Bryan Sullivan, male    DOB: Feb 18, 1946, 69 y.o.   MRN: [id number] CC: f/u BPH, low back pain, presyncopal episode  HPI 69 yo M presents with his son for follow up  1. BPH: taking nightly flomax. Stream is  Now coming out easier. No dysuria. No flank pain. Has MSK back pain. No bony pain.   2. Presyncope: occurred 2 days ago. Felt dizzy and lightheaded while sitting and eating soup. No CP, SOB or palpitations. This episode occurred in the setting or recent tooth extractions and poor oral intake.  Was evaluated at [facility]. Given IV fluids. No recurrent symptoms.   3. Back pain: b/l lumbar. Tightness. Tylenol and massage helps. No injury or heavy lifting. No fever or chills.   4. Glaucoma medications: has an opthalmologist Dr. Arlis Porta. Prescribed medications. Would like to get them here via patient assistance.    Soc Hx: non smoker  Review of Systems  Constitutional: Negative for fever, chills, fatigue and unexpected weight change.  Eyes: Negative for visual disturbance.  Respiratory: Negative for cough and shortness of breath.   Cardiovascular: Negative for chest pain, palpitations and leg swelling.  Gastrointestinal: Negative for nausea, vomiting, abdominal pain, diarrhea, constipation and blood in stool.  Genitourinary: Negative for dysuria and difficulty urinating.  Musculoskeletal: Positive for back pain. Negative for myalgias, arthralgias, gait problem and neck pain.  Skin: Negative for rash.  Neurological: Positive for light-headedness. Negative for syncope and headaches.       Objective:   Physical Exam BP 145/79 mmHg  Pulse 76  Temp(Src) 98.3 F (36.8 C) (Oral)  Resp 16  Ht 5\' 10"  (1.778 m)  Wt 192 lb (87.091 kg)  BMI 27.55 kg/m2  SpO2 100% General appearance: alert, cooperative and no distress Neck: no adenopathy, no carotid bruit, no JVD, supple, symmetrical, trachea midline and thyroid not enlarged, symmetric, no  tenderness/mass/nodules Lungs: clear to auscultation bilaterally Heart: regular rate and rhythm, S1, S2 normal, no murmur, click, rub or gallop Back: TTP b/l paraspinal muscle in lumbar area   CBG 86     Assessment & Plan:

## 2014-12-16 NOTE — Assessment & Plan Note (Signed)
Pre syncope: Appears to be related to poor intake Normal blood sugar today If it happens again please return for evaluation

## 2015-03-24 ENCOUNTER — Other Ambulatory Visit: Payer: Self-pay | Admitting: Family Medicine

## 2015-04-28 ENCOUNTER — Other Ambulatory Visit: Payer: Self-pay | Admitting: Family Medicine

## 2015-04-28 DIAGNOSIS — I10 Essential (primary) hypertension: Secondary | ICD-10-CM

## 2015-04-28 NOTE — Telephone Encounter (Signed)
Patient is hoping to get his refill expedited as he only has one pill left. Please follow up with pt. Thank you.

## 2015-05-03 ENCOUNTER — Telehealth: Payer: Self-pay

## 2015-05-03 NOTE — Telephone Encounter (Signed)
Patient's son left message on nurse's voicemail requesting BP medication to be refilled. Nurse called patient, patient's son answered telephone. Son, Bryan Sullivan, verified patient's date of birth. [facility] informed nurse medication refill was taken care of and patient picked medication up yesterday. Patient has no needs at this time.

## 2015-05-31 MED FILL — $PROAIR HFA 90 MCG INHALER: 90 MCG | 29 days supply | Qty: 1 | Fill #4

## 2015-05-31 MED FILL — LATANOPROST 0.005% EYE DRP: 0.005 | 30 days supply | Qty: 3 | Fill #5

## 2015-05-31 MED FILL — PREDNISOLONE AC 1% EYE DROP: 1 | 30 days supply | Qty: 5 | Fill #3

## 2015-05-31 MED FILL — BRIMONIDINE 0.2% EYE DROP: 0.2 | 33 days supply | Qty: 10 | Fill #5

## 2015-05-31 MED FILL — DORZOLAMIDE HCL 2% EYE DROP: 2 | 30 days supply | Qty: 10 | Fill #4

## 2015-05-31 MED FILL — ?TAMSULOSIN HCL 0.4 MG CAP: 0.4 | 30 days supply | Qty: 30 | Fill #2

## 2015-05-31 MED FILL — ?AMLODIPINE BESYLATE 5 MG T: 5 | 30 days supply | Qty: 30 | Fill #1

## 2015-05-31 MED FILL — $Qvar 80mcg Oral Inhaler: 30 days supply | Qty: 1 | Fill #4

## 2015-05-31 MED FILL — TIMOLOL 0.5% EYE DROPS: 0.5 | 30 days supply | Qty: 5 | Fill #4

## 2015-06-21 ENCOUNTER — Encounter (HOSPITAL_COMMUNITY): Payer: Self-pay | Admitting: *Deleted

## 2015-06-21 DIAGNOSIS — T380X5A Adverse effect of glucocorticoids and synthetic analogues, initial encounter: Secondary | ICD-10-CM | POA: Diagnosis present

## 2015-06-21 DIAGNOSIS — N179 Acute kidney failure, unspecified: Secondary | ICD-10-CM | POA: Diagnosis present

## 2015-06-21 DIAGNOSIS — E872 Acidosis: Secondary | ICD-10-CM | POA: Diagnosis present

## 2015-06-21 DIAGNOSIS — J45901 Unspecified asthma with (acute) exacerbation: Secondary | ICD-10-CM | POA: Diagnosis present

## 2015-06-21 DIAGNOSIS — M545 Low back pain: Secondary | ICD-10-CM | POA: Diagnosis present

## 2015-06-21 DIAGNOSIS — Z87891 Personal history of nicotine dependence: Secondary | ICD-10-CM

## 2015-06-21 DIAGNOSIS — Z9841 Cataract extraction status, right eye: Secondary | ICD-10-CM

## 2015-06-21 DIAGNOSIS — R739 Hyperglycemia, unspecified: Secondary | ICD-10-CM | POA: Diagnosis present

## 2015-06-21 DIAGNOSIS — Z9842 Cataract extraction status, left eye: Secondary | ICD-10-CM

## 2015-06-21 DIAGNOSIS — I129 Hypertensive chronic kidney disease with stage 1 through stage 4 chronic kidney disease, or unspecified chronic kidney disease: Secondary | ICD-10-CM | POA: Diagnosis present

## 2015-06-21 DIAGNOSIS — Z961 Presence of intraocular lens: Secondary | ICD-10-CM | POA: Diagnosis present

## 2015-06-21 DIAGNOSIS — N183 Chronic kidney disease, stage 3 (moderate): Secondary | ICD-10-CM | POA: Diagnosis present

## 2015-06-21 DIAGNOSIS — N400 Enlarged prostate without lower urinary tract symptoms: Secondary | ICD-10-CM | POA: Diagnosis present

## 2015-06-21 DIAGNOSIS — R7303 Prediabetes: Secondary | ICD-10-CM | POA: Diagnosis present

## 2015-06-21 DIAGNOSIS — J189 Pneumonia, unspecified organism: Secondary | ICD-10-CM | POA: Diagnosis present

## 2015-06-21 DIAGNOSIS — J440 Chronic obstructive pulmonary disease with acute lower respiratory infection: Principal | ICD-10-CM | POA: Diagnosis present

## 2015-06-21 DIAGNOSIS — J441 Chronic obstructive pulmonary disease with (acute) exacerbation: Secondary | ICD-10-CM | POA: Diagnosis present

## 2015-06-21 MED ORDER — ONDANSETRON 4 MG PO TBDP
4.0000 mg | ORAL_TABLET | Freq: Once | ORAL | Status: AC | PRN
  Administered 2015-06-21: 4 mg via ORAL

## 2015-06-21 MED ORDER — ALBUTEROL SULFATE (2.5 MG/3ML) 0.083% IN NEBU
5.0000 mg | INHALATION_SOLUTION | Freq: Once | RESPIRATORY_TRACT | Status: AC
  Administered 2015-06-21: 5 mg via RESPIRATORY_TRACT

## 2015-06-21 MED ORDER — ONDANSETRON 4 MG PO TBDP
ORAL_TABLET | ORAL | Status: AC
  Filled 2015-06-21: qty 1

## 2015-06-21 MED ORDER — ALBUTEROL SULFATE (2.5 MG/3ML) 0.083% IN NEBU
INHALATION_SOLUTION | RESPIRATORY_TRACT | Status: AC
  Filled 2015-06-21: qty 6

## 2015-06-21 NOTE — ED Notes (Signed)
Pt vomited x 1 in triage. Given zofran, protocol orders placed.

## 2015-06-21 NOTE — ED Notes (Signed)
Pt has hx of asthma, states he feels wheezy, dry cough, chills today. Used inhaler with little relief.

## 2015-06-22 ENCOUNTER — Encounter (HOSPITAL_COMMUNITY): Payer: Self-pay | Admitting: Internal Medicine

## 2015-06-22 ENCOUNTER — Inpatient Hospital Stay (HOSPITAL_COMMUNITY): Payer: Medicaid Other

## 2015-06-22 ENCOUNTER — Inpatient Hospital Stay (HOSPITAL_COMMUNITY)
Admission: EM | Admit: 2015-06-22 | Discharge: 2015-06-24 | DRG: 190 | Disposition: A | Discharge status: Home / Self Care | Payer: Medicaid Other | Attending: Family Medicine | Admitting: Family Medicine

## 2015-06-22 ENCOUNTER — Emergency Department (HOSPITAL_COMMUNITY): Payer: Medicaid Other

## 2015-06-22 DIAGNOSIS — E872 Acidosis: Secondary | ICD-10-CM | POA: Diagnosis present

## 2015-06-22 DIAGNOSIS — I129 Hypertensive chronic kidney disease with stage 1 through stage 4 chronic kidney disease, or unspecified chronic kidney disease: Secondary | ICD-10-CM | POA: Diagnosis present

## 2015-06-22 DIAGNOSIS — J189 Pneumonia, unspecified organism: Secondary | ICD-10-CM | POA: Diagnosis present

## 2015-06-22 DIAGNOSIS — J440 Chronic obstructive pulmonary disease with acute lower respiratory infection: Secondary | ICD-10-CM | POA: Diagnosis present

## 2015-06-22 DIAGNOSIS — R1110 Vomiting, unspecified: Secondary | ICD-10-CM

## 2015-06-22 DIAGNOSIS — M545 Low back pain: Secondary | ICD-10-CM | POA: Diagnosis present

## 2015-06-22 DIAGNOSIS — I10 Essential (primary) hypertension: Secondary | ICD-10-CM | POA: Diagnosis present

## 2015-06-22 DIAGNOSIS — Z9841 Cataract extraction status, right eye: Secondary | ICD-10-CM | POA: Diagnosis not present

## 2015-06-22 DIAGNOSIS — J45901 Unspecified asthma with (acute) exacerbation: Secondary | ICD-10-CM | POA: Diagnosis present

## 2015-06-22 DIAGNOSIS — N179 Acute kidney failure, unspecified: Secondary | ICD-10-CM | POA: Diagnosis present

## 2015-06-22 DIAGNOSIS — J441 Chronic obstructive pulmonary disease with (acute) exacerbation: Secondary | ICD-10-CM | POA: Diagnosis present

## 2015-06-22 DIAGNOSIS — Z9842 Cataract extraction status, left eye: Secondary | ICD-10-CM | POA: Diagnosis not present

## 2015-06-22 DIAGNOSIS — Z961 Presence of intraocular lens: Secondary | ICD-10-CM | POA: Diagnosis present

## 2015-06-22 DIAGNOSIS — N183 Chronic kidney disease, stage 3 (moderate): Secondary | ICD-10-CM | POA: Diagnosis present

## 2015-06-22 DIAGNOSIS — N400 Enlarged prostate without lower urinary tract symptoms: Secondary | ICD-10-CM | POA: Diagnosis present

## 2015-06-22 DIAGNOSIS — T380X5A Adverse effect of glucocorticoids and synthetic analogues, initial encounter: Secondary | ICD-10-CM | POA: Diagnosis present

## 2015-06-22 DIAGNOSIS — R739 Hyperglycemia, unspecified: Secondary | ICD-10-CM | POA: Diagnosis present

## 2015-06-22 DIAGNOSIS — Z87891 Personal history of nicotine dependence: Secondary | ICD-10-CM | POA: Diagnosis not present

## 2015-06-22 DIAGNOSIS — R7303 Prediabetes: Secondary | ICD-10-CM | POA: Diagnosis present

## 2015-06-22 LAB — HEPATIC FUNCTION PANEL
ALT: 18 U/L (ref 17–63)
AST: 30 U/L (ref 15–41)
Albumin: 3.3 g/dL — ABNORMAL LOW (ref 3.5–5.0)
Alkaline Phosphatase: 51 U/L (ref 38–126)
Bilirubin, Direct: 0.1 mg/dL (ref 0.1–0.5)
Indirect Bilirubin: 0.3 mg/dL (ref 0.3–0.9)
Total Bilirubin: 0.4 mg/dL (ref 0.3–1.2)
Total Protein: 6.6 g/dL (ref 6.5–8.1)

## 2015-06-22 LAB — COMPREHENSIVE METABOLIC PANEL
ALT: 15 U/L — ABNORMAL LOW (ref 17–63)
ALT: 18 U/L (ref 17–63)
AST: 17 U/L (ref 15–41)
AST: 27 U/L (ref 15–41)
Albumin: 3.3 g/dL — ABNORMAL LOW (ref 3.5–5.0)
Albumin: 3.9 g/dL (ref 3.5–5.0)
Alkaline Phosphatase: 48 U/L (ref 38–126)
Alkaline Phosphatase: 66 U/L (ref 38–126)
Anion gap: 10 (ref 5–15)
Anion gap: 16 — ABNORMAL HIGH (ref 5–15)
BUN: 19 mg/dL (ref 6–20)
BUN: 22 mg/dL — ABNORMAL HIGH (ref 6–20)
CO2: 21 mmol/L — ABNORMAL LOW (ref 22–32)
CO2: 28 mmol/L (ref 22–32)
Calcium: 9.1 mg/dL (ref 8.9–10.3)
Calcium: 9.5 mg/dL (ref 8.9–10.3)
Chloride: 101 mmol/L (ref 101–111)
Chloride: 102 mmol/L (ref 101–111)
Creatinine, Ser: 1.42 mg/dL — ABNORMAL HIGH (ref 0.61–1.24)
Creatinine, Ser: 1.66 mg/dL — ABNORMAL HIGH (ref 0.61–1.24)
GFR calc Af Amer: 46 mL/min — ABNORMAL LOW (ref >60)
GFR calc Af Amer: 55 mL/min — ABNORMAL LOW (ref >60)
GFR calc non Af Amer: 40 mL/min — ABNORMAL LOW (ref >60)
GFR calc non Af Amer: 48 mL/min — ABNORMAL LOW (ref >60)
Glucose, Bld: 131 mg/dL — ABNORMAL HIGH (ref 65–99)
Glucose, Bld: 241 mg/dL — ABNORMAL HIGH (ref 65–99)
Potassium: 4 mmol/L (ref 3.5–5.1)
Potassium: 4.5 mmol/L (ref 3.5–5.1)
Sodium: 139 mmol/L (ref 135–145)
Sodium: 139 mmol/L (ref 135–145)
Total Bilirubin: 0.4 mg/dL (ref 0.3–1.2)
Total Bilirubin: 0.5 mg/dL (ref 0.3–1.2)
Total Protein: 6.5 g/dL (ref 6.5–8.1)
Total Protein: 6.9 g/dL (ref 6.5–8.1)

## 2015-06-22 LAB — BASIC METABOLIC PANEL
Anion gap: 20 — ABNORMAL HIGH (ref 5–15)
BUN: 23 mg/dL — ABNORMAL HIGH (ref 6–20)
CO2: 19 mmol/L — ABNORMAL LOW (ref 22–32)
Calcium: 9.1 mg/dL (ref 8.9–10.3)
Chloride: 98 mmol/L — ABNORMAL LOW (ref 101–111)
Creatinine, Ser: 1.87 mg/dL — ABNORMAL HIGH (ref 0.61–1.24)
GFR calc Af Amer: 40 mL/min — ABNORMAL LOW (ref >60)
GFR calc non Af Amer: 34 mL/min — ABNORMAL LOW (ref >60)
Glucose, Bld: 201 mg/dL — ABNORMAL HIGH (ref 65–99)
Potassium: 3.5 mmol/L (ref 3.5–5.1)
Sodium: 137 mmol/L (ref 135–145)

## 2015-06-22 LAB — URINALYSIS, ROUTINE W REFLEX MICROSCOPIC
Bilirubin Urine: NEGATIVE
Bilirubin Urine: NEGATIVE
Glucose, UA: NEGATIVE mg/dL
Glucose, UA: NEGATIVE mg/dL
Hgb urine dipstick: NEGATIVE
Hgb urine dipstick: NEGATIVE
Ketones, ur: 15 mg/dL — AB
Ketones, ur: NEGATIVE mg/dL
Leukocytes, UA: NEGATIVE
Leukocytes, UA: NEGATIVE
Nitrite: NEGATIVE
Nitrite: NEGATIVE
Protein, ur: NEGATIVE mg/dL
Protein, ur: NEGATIVE mg/dL
Specific Gravity, Urine: 1.015 (ref 1.005–1.030)
Specific Gravity, Urine: >1.046 — ABNORMAL HIGH (ref 1.005–1.030)
pH: 5 (ref 5.0–8.0)
pH: 6.5 (ref 5.0–8.0)

## 2015-06-22 LAB — CREATININE, URINE, RANDOM: Creatinine, Urine: 124.67 mg/dL

## 2015-06-22 LAB — CBC WITH DIFFERENTIAL/PLATELET
Basophils Absolute: 0 10*3/uL (ref 0.0–0.1)
Basophils Relative: 0 %
Eosinophils Absolute: 0 10*3/uL (ref 0.0–0.7)
Eosinophils Relative: 0 %
HCT: 42.4 % (ref 39.0–52.0)
Hemoglobin: 13.7 g/dL (ref 13.0–17.0)
Lymphocytes Relative: 3 %
Lymphs Abs: 0.5 10*3/uL — ABNORMAL LOW (ref 0.7–4.0)
MCH: 28.1 pg (ref 26.0–34.0)
MCHC: 32.3 g/dL (ref 30.0–36.0)
MCV: 87.1 fL (ref 78.0–100.0)
Monocytes Absolute: 0.3 10*3/uL (ref 0.1–1.0)
Monocytes Relative: 2 %
Neutro Abs: 14.7 10*3/uL — ABNORMAL HIGH (ref 1.7–7.7)
Neutrophils Relative %: 95 %
Platelets: 202 10*3/uL (ref 150–400)
RBC: 4.87 MIL/uL (ref 4.22–5.81)
RDW: 13.1 % (ref 11.5–15.5)
WBC: 15.5 10*3/uL — ABNORMAL HIGH (ref 4.0–10.5)

## 2015-06-22 LAB — CBC
HCT: 45.3 % (ref 39.0–52.0)
Hemoglobin: 15 g/dL (ref 13.0–17.0)
MCH: 28.7 pg (ref 26.0–34.0)
MCHC: 33.1 g/dL (ref 30.0–36.0)
MCV: 86.6 fL (ref 78.0–100.0)
Platelets: 210 10*3/uL (ref 150–400)
RBC: 5.23 MIL/uL (ref 4.22–5.81)
RDW: 13 % (ref 11.5–15.5)
WBC: 7.8 10*3/uL (ref 4.0–10.5)

## 2015-06-22 LAB — INFLUENZA PANEL BY PCR (TYPE A & B)
H1N1 flu by pcr: NOT DETECTED
Influenza A By PCR: NEGATIVE
Influenza B By PCR: NEGATIVE

## 2015-06-22 LAB — CORTISOL: Cortisol, Plasma: 7.4 ug/dL

## 2015-06-22 LAB — SODIUM, URINE, RANDOM: Sodium, Ur: <10 mmol/L

## 2015-06-22 LAB — HIV ANTIBODY (ROUTINE TESTING W REFLEX): HIV Screen 4th Generation wRfx: NONREACTIVE

## 2015-06-22 LAB — LIPASE, BLOOD: Lipase: 30 U/L (ref 11–51)

## 2015-06-22 LAB — ACETAMINOPHEN LEVEL: Acetaminophen (Tylenol), Serum: <10 ug/mL — ABNORMAL LOW (ref 10–30)

## 2015-06-22 LAB — SALICYLATE LEVEL: Salicylate Lvl: <4 mg/dL (ref 2.8–30.0)

## 2015-06-22 LAB — VITAMIN B12: Vitamin B-12: 189 pg/mL (ref 180–914)

## 2015-06-22 LAB — URIC ACID: Uric Acid, Serum: 7.1 mg/dL (ref 4.4–7.6)

## 2015-06-22 LAB — TROPONIN I: Troponin I: 0.03 ng/mL (ref <0.031)

## 2015-06-22 LAB — TSH: TSH: 0.607 u[IU]/mL (ref 0.350–4.500)

## 2015-06-22 LAB — STREP PNEUMONIAE URINARY ANTIGEN: Strep Pneumo Urinary Antigen: NEGATIVE

## 2015-06-22 LAB — LACTATE DEHYDROGENASE: LDH: 134 U/L (ref 98–192)

## 2015-06-22 LAB — CK: Total CK: 71 U/L (ref 49–397)

## 2015-06-22 LAB — LACTIC ACID, PLASMA: Lactic Acid, Venous: 9.1 mmol/L (ref 0.5–2.0)

## 2015-06-22 MED ORDER — BRIMONIDINE TARTRATE 0.2 % OP SOLN
1.0000 [drp] | Freq: Three times a day (TID) | OPHTHALMIC | Status: DC
  Administered 2015-06-22 – 2015-06-24 (×6): 1 [drp] via OPHTHALMIC
  Filled 2015-06-22: qty 5

## 2015-06-22 MED ORDER — TIMOLOL HEMIHYDRATE 0.5 % OP SOLN: 1.0000 [drp] | Freq: Two times a day (BID) | OPHTHALMIC | Status: DC

## 2015-06-22 MED ORDER — ONDANSETRON HCL 4 MG PO TABS: 4.0000 mg | ORAL_TABLET | Freq: Four times a day (QID) | ORAL | Status: DC | PRN

## 2015-06-22 MED ORDER — METHYLPREDNISOLONE SODIUM SUCC 40 MG IJ SOLR
40.0000 mg | Freq: Every day | INTRAMUSCULAR | Status: DC
  Administered 2015-06-22 – 2015-06-23 (×2): 40 mg via INTRAVENOUS
  Filled 2015-06-22 (×2): qty 1

## 2015-06-22 MED ORDER — CEFTRIAXONE SODIUM 1 G IJ SOLR
1.0000 g | INTRAMUSCULAR | Status: DC
  Administered 2015-06-23 – 2015-06-24 (×2): 1 g via INTRAVENOUS
  Filled 2015-06-22 (×2): qty 10

## 2015-06-22 MED ORDER — PREDNISOLONE ACETATE 1 % OP SUSP
1.0000 [drp] | Freq: Two times a day (BID) | OPHTHALMIC | Status: DC
  Administered 2015-06-22 – 2015-06-24 (×3): 1 [drp] via OPHTHALMIC
  Filled 2015-06-22: qty 1

## 2015-06-22 MED ORDER — DEXTROSE 5 % IV SOLN
500.0000 mg | Freq: Once | INTRAVENOUS | Status: AC
  Administered 2015-06-22: 500 mg via INTRAVENOUS
  Filled 2015-06-22: qty 500

## 2015-06-22 MED ORDER — TAMSULOSIN HCL 0.4 MG PO CAPS
0.4000 mg | ORAL_CAPSULE | Freq: Every day | ORAL | Status: DC
Start: 2015-06-22 — End: 2015-06-24
  Administered 2015-06-22 – 2015-06-24 (×3): 0.4 mg via ORAL
  Filled 2015-06-22 (×3): qty 1

## 2015-06-22 MED ORDER — ENOXAPARIN SODIUM 40 MG/0.4ML ~~LOC~~ SOLN
40.0000 mg | SUBCUTANEOUS | Status: DC
  Administered 2015-06-22 – 2015-06-23 (×2): 40 mg via SUBCUTANEOUS
  Filled 2015-06-22 (×2): qty 0.4

## 2015-06-22 MED ORDER — ALBUTEROL SULFATE (2.5 MG/3ML) 0.083% IN NEBU
2.5000 mg | INHALATION_SOLUTION | RESPIRATORY_TRACT | Status: DC | PRN
  Administered 2015-06-23 – 2015-06-24 (×2): 2.5 mg via RESPIRATORY_TRACT
  Filled 2015-06-22 (×2): qty 3

## 2015-06-22 MED ORDER — DEXTROSE 5 % IV SOLN: 1.0000 g | INTRAVENOUS | Status: DC

## 2015-06-22 MED ORDER — AZITHROMYCIN 500 MG IV SOLR
500.0000 mg | INTRAVENOUS | Status: DC
  Administered 2015-06-23 – 2015-06-24 (×2): 500 mg via INTRAVENOUS
  Filled 2015-06-22 (×2): qty 500

## 2015-06-22 MED ORDER — LATANOPROST 0.005 % OP SOLN
1.0000 [drp] | Freq: Every day | OPHTHALMIC | Status: DC
Start: 2015-06-22 — End: 2015-06-24
  Administered 2015-06-22 – 2015-06-23 (×2): 1 [drp] via OPHTHALMIC
  Filled 2015-06-22: qty 2.5

## 2015-06-22 MED ORDER — ACETAMINOPHEN 650 MG RE SUPP: 650.0000 mg | Freq: Four times a day (QID) | RECTAL | Status: DC | PRN

## 2015-06-22 MED ORDER — IPRATROPIUM BROMIDE 0.02 % IN SOLN
0.5000 mg | Freq: Once | RESPIRATORY_TRACT | Status: AC
  Administered 2015-06-22: 0.5 mg via RESPIRATORY_TRACT
  Filled 2015-06-22: qty 2.5

## 2015-06-22 MED ORDER — ACETAMINOPHEN 325 MG PO TABS: 650.0000 mg | ORAL_TABLET | Freq: Four times a day (QID) | ORAL | Status: DC | PRN

## 2015-06-22 MED ORDER — ALBUTEROL SULFATE (2.5 MG/3ML) 0.083% IN NEBU
INHALATION_SOLUTION | RESPIRATORY_TRACT | Status: AC
  Filled 2015-06-22: qty 3

## 2015-06-22 MED ORDER — IPRATROPIUM-ALBUTEROL 0.5-2.5 (3) MG/3ML IN SOLN
3.0000 mL | Freq: Three times a day (TID) | RESPIRATORY_TRACT | Status: DC
  Administered 2015-06-23 – 2015-06-24 (×4): 3 mL via RESPIRATORY_TRACT
  Filled 2015-06-22 (×5): qty 3

## 2015-06-22 MED ORDER — IPRATROPIUM BROMIDE 0.02 % IN SOLN
0.5000 mg | RESPIRATORY_TRACT | Status: DC
  Administered 2015-06-22 (×2): 0.5 mg via RESPIRATORY_TRACT
  Filled 2015-06-22 (×2): qty 2.5

## 2015-06-22 MED ORDER — IPRATROPIUM BROMIDE 0.02 % IN SOLN
RESPIRATORY_TRACT | Status: AC
Start: 2015-06-22 — End: 2015-06-22
  Filled 2015-06-22: qty 2.5

## 2015-06-22 MED ORDER — AZITHROMYCIN 500 MG IV SOLR: 500.0000 mg | INTRAVENOUS | Status: DC

## 2015-06-22 MED ORDER — DORZOLAMIDE HCL 2 % OP SOLN
1.0000 [drp] | Freq: Three times a day (TID) | OPHTHALMIC | Status: DC
  Administered 2015-06-22 – 2015-06-24 (×6): 1 [drp] via OPHTHALMIC
  Filled 2015-06-22: qty 10

## 2015-06-22 MED ORDER — SODIUM CHLORIDE 0.9 % IV SOLN
INTRAVENOUS | Status: AC
  Administered 2015-06-22 – 2015-06-23 (×2): via INTRAVENOUS

## 2015-06-22 MED ORDER — IOHEXOL 300 MG/ML  SOLN
25.0000 mL | Freq: Once | INTRAMUSCULAR | Status: AC | PRN
  Administered 2015-06-22: 25 mL via ORAL

## 2015-06-22 MED ORDER — BUDESONIDE 0.5 MG/2ML IN SUSP
0.2500 mg | Freq: Two times a day (BID) | RESPIRATORY_TRACT | Status: DC
  Administered 2015-06-22 – 2015-06-24 (×4): 0.25 mg via RESPIRATORY_TRACT
  Filled 2015-06-22 (×4): qty 2

## 2015-06-22 MED ORDER — METHYLPREDNISOLONE SODIUM SUCC 125 MG IJ SOLR
125.0000 mg | Freq: Once | INTRAMUSCULAR | Status: AC
  Administered 2015-06-22: 125 mg via INTRAVENOUS
  Filled 2015-06-22: qty 2

## 2015-06-22 MED ORDER — DEXTROSE 5 % IV SOLN
1.0000 g | Freq: Once | INTRAVENOUS | Status: AC
  Administered 2015-06-22: 1 g via INTRAVENOUS
  Filled 2015-06-22: qty 10

## 2015-06-22 MED ORDER — ONDANSETRON HCL 4 MG/2ML IJ SOLN: 4.0000 mg | Freq: Four times a day (QID) | INTRAMUSCULAR | Status: DC | PRN

## 2015-06-22 MED ORDER — IPRATROPIUM BROMIDE 0.02 % IN SOLN
0.5000 mg | RESPIRATORY_TRACT | Status: DC
  Administered 2015-06-22: 0.5 mg via RESPIRATORY_TRACT

## 2015-06-22 MED ORDER — AMLODIPINE BESYLATE 5 MG PO TABS
5.0000 mg | ORAL_TABLET | Freq: Every day | ORAL | Status: DC
  Administered 2015-06-22 – 2015-06-24 (×3): 5 mg via ORAL
  Filled 2015-06-22 (×3): qty 1

## 2015-06-22 MED ORDER — TIMOLOL MALEATE 0.25 % OP SOLN
1.0000 [drp] | Freq: Two times a day (BID) | OPHTHALMIC | Status: DC
  Administered 2015-06-22 – 2015-06-24 (×4): 1 [drp] via OPHTHALMIC
  Filled 2015-06-22: qty 5

## 2015-06-22 MED ORDER — ALBUTEROL SULFATE (2.5 MG/3ML) 0.083% IN NEBU
2.5000 mg | INHALATION_SOLUTION | RESPIRATORY_TRACT | Status: DC
  Administered 2015-06-22 (×2): 2.5 mg via RESPIRATORY_TRACT
  Filled 2015-06-22 (×2): qty 3

## 2015-06-22 MED ORDER — IPRATROPIUM-ALBUTEROL 0.5-2.5 (3) MG/3ML IN SOLN
3.0000 mL | RESPIRATORY_TRACT | Status: DC
  Administered 2015-06-22: 3 mL via RESPIRATORY_TRACT
  Filled 2015-06-22: qty 3

## 2015-06-22 MED ORDER — ALBUTEROL SULFATE (2.5 MG/3ML) 0.083% IN NEBU
2.5000 mg | INHALATION_SOLUTION | RESPIRATORY_TRACT | Status: DC
  Administered 2015-06-22: 2.5 mg via RESPIRATORY_TRACT

## 2015-06-22 MED ORDER — ALBUTEROL (5 MG/ML) CONTINUOUS INHALATION SOLN
10.0000 mg/h | INHALATION_SOLUTION | Freq: Once | RESPIRATORY_TRACT | Status: AC
  Administered 2015-06-22: 10 mg/h via RESPIRATORY_TRACT
  Filled 2015-06-22: qty 20

## 2015-06-22 MED ORDER — IOHEXOL 300 MG/ML  SOLN
100.0000 mL | Freq: Once | INTRAMUSCULAR | Status: AC | PRN
  Administered 2015-06-22: 80 mL via INTRAVENOUS

## 2015-06-22 NOTE — ED Provider Notes (Signed)
CSN: [id number]     Arrival date & time 06/21/15  2307 History  By signing my name below, I, Helane Gunther, attest that this documentation has been prepared under the direction and in the presence of Rolland Porter, MD at [facility]. Electronically Signed: Helane Gunther, ED Scribe. 06/22/2015. 3:32 AM.     Chief Complaint  Patient presents with  . Asthma  . Chills  . Emesis   The history is provided by the patient and a relative. No language interpreter was used.   HPI Comments: Bryan Sullivan is a 70 y.o. male former smoker with a PMHx of HTN and asthma who presents to the Emergency Department complaining of an asthma flare-up and chills staring in the morning on Jan 24, as well as emesis onset just after arriving in the ED. Per son, pt has associated productive (white) cough, chest congestion, wheezing, and vomiting after arriving at the ED. He notes pt has felt warm, but was not found to have a fever. Pt states he has an inhaler at home which he has been using with moderate relief for about 2 hours then he feels worse. Per son, he has not been seen in the hospital for the past 4 months. Pt denies fever, diarrhea, chest pain, and abdominal pain.   PCP Dr Adrian Blackwater  Past Medical History  Diagnosis Date  . Asthma     As child   . Hypertension     Dx at age 42  . Enlarged prostate Dx 2001   Past Surgical History  Procedure Laterality Date  . Eye surgery    . Cataract extraction w/ intraocular lens  implant, bilateral Bilateral   . Corneal transplant Right    Family History  Problem Relation Age of Onset  . Diabetes Mother   . Asthma Father   . Asthma Sister    Social History  Substance Use Topics  . Smoking status: Former Smoker    Types: Cigarettes    Quit date: 09/21/1968  . Smokeless tobacco: Never Used     Comment: "quit smoking cigarettes in 1970"  . Alcohol Use: No  lives with son  Review of Systems  Constitutional: Positive for chills. Negative for fever.  HENT:  Positive for congestion.   Respiratory: Positive for cough and wheezing.   Gastrointestinal: Positive for vomiting. Negative for abdominal pain and diarrhea.  All other systems reviewed and are negative.   Allergies  Review of patient's allergies indicates no known allergies.  Home Medications   Prior to Admission medications   Medication Sig Start Date End Date Taking? Authorizing Provider  albuterol (PROVENTIL HFA;VENTOLIN HFA) 108 (90 BASE) MCG/ACT inhaler Inhale 2 puffs into the lungs every 2 (two) hours as needed for wheezing or shortness of breath (cough). 12/10/14  Yes Tresa Garter, MD  amLODipine (NORVASC) 5 MG tablet TAKE 1 TABLET BY MOUTH DAILY 04/29/15  Yes Josalyn Funches, MD  beclomethasone (QVAR) 80 MCG/ACT inhaler Inhale 2 puffs into the lungs 2 (two) times daily. 12/10/14  Yes Tresa Garter, MD  brimonidine (ALPHAGAN) 0.2 % ophthalmic solution Place 1 drop into the right eye 3 (three) times daily. 12/16/14  Yes Josalyn Funches, MD  dorzolamide (TRUSOPT) 2 % ophthalmic solution Place 1 drop into the right eye 3 (three) times daily. 12/16/14  Yes Josalyn Funches, MD  ibuprofen (ADVIL,MOTRIN) 600 MG tablet Take 1 tablet (600 mg total) by mouth every 8 (eight) hours as needed for moderate pain. 11/24/14  Yes Boykin Nearing, MD  latanoprost (  XALATAN) 0.005 % ophthalmic solution Place 1 drop into the right eye at bedtime. 12/16/14  Yes Josalyn Funches, MD  prednisoLONE acetate (PRED FORTE) 1 % ophthalmic suspension Place 1 drop into the right eye 2 (two) times daily. 12/16/14  Yes Josalyn Funches, MD  tamsulosin (FLOMAX) 0.4 MG CAPS capsule TAKE 1 CAPSULE BY MOUTH DAILY. 03/24/15  Yes Josalyn Funches, MD  timolol (BETIMOL) 0.5 % ophthalmic solution Place 1 drop into both eyes 2 (two) times daily. 12/16/14  Yes Josalyn Funches, MD   BP 120/74 mmHg  Pulse 118  Temp(Src) 98.5 F (36.9 C) (Oral)  Resp 18  SpO2 94%  Vital signs normal except for tachycardia  Physical Exam   Constitutional: He is oriented to person, place, and time. He appears well-developed and well-nourished.  Non-toxic appearance. He does not appear ill. No distress.  At the time of my exam patient had artery had a nebulizer treatment in the ED.  HENT:  Head: Normocephalic and atraumatic.  Right Ear: External ear normal.  Left Ear: External ear normal.  Nose: Nose normal. No mucosal edema or rhinorrhea.  Mouth/Throat: Oropharynx is clear and moist and mucous membranes are normal. No dental abscesses or uvula swelling.  Eyes: Conjunctivae and EOM are normal. Pupils are equal, round, and reactive to light.  Neck: Normal range of motion and full passive range of motion without pain. Neck supple.  Cardiovascular: Normal rate, regular rhythm and normal heart sounds.  Exam reveals no gallop and no friction rub.   No murmur heard. Pulmonary/Chest: Effort normal. No respiratory distress. He has wheezes. He has no rhonchi. He has no rales. He exhibits no tenderness and no crepitus.  Lung sounds diminished with some rhonchi and wheezing  Abdominal: Soft. Normal appearance and bowel sounds are normal. He exhibits no distension. There is no tenderness. There is no rebound and no guarding.  Musculoskeletal: Normal range of motion. He exhibits no edema or tenderness.  Moves all extremities well.   Neurological: He is alert and oriented to person, place, and time. He has normal strength. No cranial nerve deficit.  Skin: Skin is warm, dry and intact. No rash noted. No erythema. No pallor.  Pt feels hot  Psychiatric: He has a normal mood and affect. His speech is normal and behavior is normal. His mood appears not anxious.  Nursing note and vitals reviewed.   ED Course  Procedures  Medications  albuterol (PROVENTIL) (2.5 MG/3ML) 0.083% nebulizer solution 5 mg ( Nebulization Canceled Entry 06/21/15 2326)  ondansetron (ZOFRAN-ODT) disintegrating tablet 4 mg (4 mg Oral Given 06/21/15 2332)  albuterol  (PROVENTIL,VENTOLIN) solution continuous neb (10 mg/hr Nebulization Given 06/22/15 0422)  ipratropium (ATROVENT) nebulizer solution 0.5 mg (0.5 mg Nebulization Given 06/22/15 0422)  methylPREDNISolone sodium succinate (SOLU-MEDROL) 125 mg/2 mL injection 125 mg (125 mg Intravenous Given 06/22/15 0404)  azithromycin (ZITHROMAX) 500 mg in dextrose 5 % 250 mL IVPB (0 mg Intravenous Stopped 06/22/15 0530)  cefTRIAXone (ROCEPHIN) 1 g in dextrose 5 % 50 mL IVPB (0 g Intravenous Stopped 06/22/15 0530)   DIAGNOSTIC STUDIES: Oxygen Saturation is 94% on RA, low by my interpretation.    COORDINATION OF CARE: 3:31 AM - Discussed lab and imaging results of possible PNA. Discussed plans to order antibiotics and a breathing treatment, as well as possible need for admission. Pt advised of plan for treatment and pt agrees.  Review of prior visit shows he was admitted in April with asthma and community-acquired pneumonia.  Patient was started on antibiotics  for community-acquired pneumonia. He was given a dose of IV steroids. He was given a continuous nebulizer of albuterol for his feeling of wheezing and shortness of breath. We had discussed admission and he is agreeable.  Recheck after his continuous nebulizer patient has clearer lung sounds, but he still has some increased work of breathing.  0532 AM patient discussed with Dr. Hal Hope, admit to tele   Results for orders placed or performed during the hospital encounter of 06/22/15  Lipase, blood  Result Value Ref Range   Lipase 30 11 - 51 U/L  Comprehensive metabolic panel  Result Value Ref Range   Sodium 139 135 - 145 mmol/L   Potassium 4.5 3.5 - 5.1 mmol/L   Chloride 101 101 - 111 mmol/L   CO2 28 22 - 32 mmol/L   Glucose, Bld 131 (H) 65 - 99 mg/dL   BUN 19 6 - 20 mg/dL   Creatinine, Ser 1.42 (H) 0.61 - 1.24 mg/dL   Calcium 9.5 8.9 - 10.3 mg/dL   Total Protein 6.9 6.5 - 8.1 g/dL   Albumin 3.9 3.5 - 5.0 g/dL   AST 17 15 - 41 U/L   ALT 15 (L) 17 -  63 U/L   Alkaline Phosphatase 66 38 - 126 U/L   Total Bilirubin 0.4 0.3 - 1.2 mg/dL   GFR calc non Af Amer 48 (L) >60 mL/min   GFR calc Af Amer 55 (L) >60 mL/min   Anion gap 10 5 - 15  CBC  Result Value Ref Range   WBC 7.8 4.0 - 10.5 K/uL   RBC 5.23 4.22 - 5.81 MIL/uL   Hemoglobin 15.0 13.0 - 17.0 g/dL   HCT 45.3 39.0 - 52.0 %   MCV 86.6 78.0 - 100.0 fL   MCH 28.7 26.0 - 34.0 pg   MCHC 33.1 30.0 - 36.0 g/dL   RDW 13.0 11.5 - 15.5 %   Platelets 210 150 - 400 K/uL  Urinalysis, Routine w reflex microscopic (not at [facility])  Result Value Ref Range   Color, Urine YELLOW YELLOW   APPearance CLEAR CLEAR   Specific Gravity, Urine 1.015 1.005 - 1.030   pH 6.5 5.0 - 8.0   Glucose, UA NEGATIVE NEGATIVE mg/dL   Hgb urine dipstick NEGATIVE NEGATIVE   Bilirubin Urine NEGATIVE NEGATIVE   Ketones, ur NEGATIVE NEGATIVE mg/dL   Protein, ur NEGATIVE NEGATIVE mg/dL   Nitrite NEGATIVE NEGATIVE   Leukocytes, UA NEGATIVE NEGATIVE   Laboratory interpretation all normal  Dg Chest 2 View  06/22/2015  CLINICAL DATA:  Acute onset of chills, cough and mid chest pain. Initial encounter. EXAM: CHEST  2 VIEW COMPARISON:  Chest radiograph from 10/11/2014 FINDINGS: The lungs are well-aerated. Mild left basilar opacity likely reflects atelectasis, though mild pneumonia might have a similar appearance. There is no evidence of pleural effusion or pneumothorax. The heart is normal in size; the mediastinal contour is within normal limits. No acute osseous abnormalities are seen. IMPRESSION: Mild left basilar opacity likely reflects atelectasis, though mild pneumonia might have a similar appearance. Electronically Signed   By: Garald Balding M.D.   On: 06/22/2015 01:26      MDM   Final diagnoses:  Asthma attack  CAP (community acquired pneumonia)    Plan admission  Rolland Porter, MD, FACEP  CRITICAL CARE Performed by: Rolland Porter L Total critical care time: 31 minutes Critical care time was exclusive of  separately billable procedures and treating other patients. Critical care was necessary to treat or  prevent imminent or life-threatening deterioration. Critical care was time spent personally by me on the following activities: development of treatment plan with patient and/or surrogate as well as nursing, discussions with consultants, evaluation of patient's response to treatment, examination of patient, obtaining history from patient or surrogate, ordering and performing treatments and interventions, ordering and review of laboratory studies, ordering and review of radiographic studies, pulse oximetry and re-evaluation of patient's condition.   I personally performed the services described in this documentation, which was scribed in my presence. The recorded information has been reviewed and considered.  Rolland Porter, MD, Barbette Or, MD 06/22/15 0600

## 2015-06-22 NOTE — ED Notes (Signed)
Called Admitting MD advised of Lactic recommendation CT.

## 2015-06-22 NOTE — Consult Note (Signed)
Reason for Consult:  AKI and lactic acidosis Referring Physician: Darrick Meigs, MD  Bryan Sullivan is an 70 y.o. male.  HPI: pt is a 70yo [place] man with PMH sig for Asthma/COPD and hypertension who presented to [facility] on 06/21/15 with a 1 day h/o acute onset shortness of breath.  This was also associated with chills, malaise, cough, and N/V.  He also has complained of low back pain (mainly right-sided) and muscle cramping in his thighs.  His CXR was suggestive of infiltrates and he was admitted for IV antibiotics for community-acquired pneumonia.  Further workup also revealed an elevated lactate level and a CT scan of his abdomen and pelvis with IV contrast was ordered.  Unfortunately he received IV contrast before the labs revealed that his Scr rose from 1.42 to 1.87.  He admits to taking ibuprofen 600 mg several times before admission.  The trend in Scr is seen below.  We were consulted to help evaluate and manage his AKI and lactic acidosis.  He denies any diarrhea, hematechezia, melena, BRBPR, hematuria, pyuria, dysuria, urgency, frequency, retention or prior history of kidney disease or stones.  Of note, he was not taking an ACE/ARB or metformin prior to admission.  Denies any tylenol or aspirin use.  Trend in Creatinine: CREATININE, SER  Date/Time Value Ref Range Status  06/22/2015 08:57 AM 1.87* 0.61 - 1.24 mg/dL Final  06/21/2015 11:56 PM 1.42* 0.61 - 1.24 mg/dL Final  10/11/2014 09:51 AM 1.04 0.50 - 1.35 mg/dL Final  09/15/2014 07:05 AM 1.51* 0.50 - 1.35 mg/dL Final  09/14/2014 02:02 PM 1.22 0.50 - 1.35 mg/dL Final  09/11/2014 03:10 PM 1.09 0.50 - 1.35 mg/dL Final  09/05/2014 03:37 PM 0.94 0.50 - 1.35 mg/dL Final  05/26/2013 03:55 PM 1.00 0.50 - 1.35 mg/dL Final  03/21/2013 04:07 PM 1.12 0.50 - 1.35 mg/dL Final    PMH:   Past Medical History  Diagnosis Date  . Asthma     As child   . Hypertension     Dx at age 43  . Enlarged prostate Dx 2001    PSH:   Past Surgical History   Procedure Laterality Date  . Eye surgery    . Cataract extraction w/ intraocular lens  implant, bilateral Bilateral   . Corneal transplant Right     Allergies: No Known Allergies  Medications:   Prior to Admission medications   Medication Sig Start Date End Date Taking? Authorizing Provider  albuterol (PROVENTIL HFA;VENTOLIN HFA) 108 (90 BASE) MCG/ACT inhaler Inhale 2 puffs into the lungs every 2 (two) hours as needed for wheezing or shortness of breath (cough). 12/10/14  Yes Tresa Garter, MD  amLODipine (NORVASC) 5 MG tablet TAKE 1 TABLET BY MOUTH DAILY 04/29/15  Yes Josalyn Funches, MD  beclomethasone (QVAR) 80 MCG/ACT inhaler Inhale 2 puffs into the lungs 2 (two) times daily. 12/10/14  Yes Tresa Garter, MD  brimonidine (ALPHAGAN) 0.2 % ophthalmic solution Place 1 drop into the right eye 3 (three) times daily. 12/16/14  Yes Josalyn Funches, MD  dorzolamide (TRUSOPT) 2 % ophthalmic solution Place 1 drop into the right eye 3 (three) times daily. 12/16/14  Yes Josalyn Funches, MD  ibuprofen (ADVIL,MOTRIN) 600 MG tablet Take 1 tablet (600 mg total) by mouth every 8 (eight) hours as needed for moderate pain. 11/24/14  Yes Josalyn Funches, MD  latanoprost (XALATAN) 0.005 % ophthalmic solution Place 1 drop into the right eye at bedtime. 12/16/14  Yes Boykin Nearing, MD  prednisoLONE acetate (PRED FORTE)  1 % ophthalmic suspension Place 1 drop into the right eye 2 (two) times daily. 12/16/14  Yes Josalyn Funches, MD  tamsulosin (FLOMAX) 0.4 MG CAPS capsule TAKE 1 CAPSULE BY MOUTH DAILY. 03/24/15  Yes Josalyn Funches, MD  timolol (BETIMOL) 0.5 % ophthalmic solution Place 1 drop into both eyes 2 (two) times daily. 12/16/14  Yes Boykin Nearing, MD    Inpatient medications: . albuterol  2.5 mg Nebulization Q4H  . albuterol      . amLODipine  5 mg Oral Daily  . brimonidine  1 drop Right Eye TID  . budesonide (PULMICORT) nebulizer solution  0.25 mg Nebulization BID  . dorzolamide  1 drop  Right Eye TID  . enoxaparin (LOVENOX) injection  40 mg Subcutaneous Q24H  . ipratropium      . ipratropium  0.5 mg Nebulization Q4H  . latanoprost  1 drop Right Eye QHS  . methylPREDNISolone (SOLU-MEDROL) injection  40 mg Intravenous Daily  . prednisoLONE acetate  1 drop Right Eye BID  . tamsulosin  0.4 mg Oral Daily  . timolol  1 drop Both Eyes BID    Discontinued Meds:   Medications Discontinued During This Encounter  Medication Reason  . ipratropium (ATROVENT HFA) 17 MCG/ACT inhaler Discontinued by provider  . azithromycin (ZITHROMAX) 500 mg in dextrose 5 % 250 mL IVPB   . cefTRIAXone (ROCEPHIN) 1 g in dextrose 5 % 50 mL IVPB   . albuterol (PROVENTIL) (2.5 MG/3ML) 0.083% nebulizer solution 2.5 mg   . ipratropium (ATROVENT) nebulizer solution 0.5 mg     Social History:  reports that he quit smoking about 46 years ago. His smoking use included Cigarettes. He has never used smokeless tobacco. He reports that he does not drink alcohol or use illicit drugs.  Family History:   Family History  Problem Relation Age of Onset  . Diabetes Mother   . Asthma Father   . Asthma Sister     Pertinent items are noted in HPI. Weight change:  No intake or output data in the 24 hours ending 06/22/15 1230 BP 132/76 mmHg  Pulse 97  Temp(Src) 99.3 F (37.4 C) (Oral)  Resp 29  SpO2 99% Filed Vitals:   06/22/15 0915 06/22/15 0930 06/22/15 0945 06/22/15 1035  BP: 114/61 105/54 110/58 132/76  Pulse: 96 98 97   Temp:      TempSrc:      Resp: 32 21 29   SpO2: 100% 100% 99%      General appearance: alert, cooperative and no distress Head: Normocephalic, without obvious abnormality, atraumatic Eyes: negative findings: lids and lashes normal and conjunctivae and sclerae normal, cloudy right eye Resp: rhonchi bilaterally and wheezes RLL Cardio: regular rate and rhythm, S1, S2 normal, no murmur, click, rub or gallop GI: soft, non-tender; bowel sounds normal; no masses,  no  organomegaly Extremities: extremities normal, atraumatic, no cyanosis or edema  Labs: Basic Metabolic Panel:  Recent Labs Lab 06/21/15 2356 06/22/15 0857  NA 139 137  K 4.5 3.5  CL 101 98*  CO2 28 19*  GLUCOSE 131* 201*  BUN 19 23*  CREATININE 1.42* 1.87*  ALBUMIN 3.9 3.3*  CALCIUM 9.5 9.1   Liver Function Tests:  Recent Labs Lab 06/21/15 2356 06/22/15 0857  AST 17 30  ALT 15* 18  ALKPHOS 66 51  BILITOT 0.4 0.4  PROT 6.9 6.6  ALBUMIN 3.9 3.3*    Recent Labs Lab 06/21/15 2356  LIPASE 30   No results for input(s): AMMONIA in  the last 168 hours. CBC:  Recent Labs Lab 06/21/15 2356 06/22/15 0857  WBC 7.8 15.5*  NEUTROABS  --  14.7*  HGB 15.0 13.7  HCT 45.3 42.4  MCV 86.6 87.1  PLT 210 202   PT/INR: @LABRCNTIP (inr:5) Cardiac Enzymes: ) Recent Labs Lab 06/22/15 0857  TROPONINI 0.03   CBG: No results for input(s): GLUCAP in the last 168 hours.  Iron Studies: No results for input(s): IRON, TIBC, TRANSFERRIN, FERRITIN in the last 168 hours.  Xrays/Other Studies: Dg Chest 2 View  06/22/2015  CLINICAL DATA:  Acute onset of chills, cough and mid chest pain. Initial encounter. EXAM: CHEST  2 VIEW COMPARISON:  Chest radiograph from 10/11/2014 FINDINGS: The lungs are well-aerated. Mild left basilar opacity likely reflects atelectasis, though mild pneumonia might have a similar appearance. There is no evidence of pleural effusion or pneumothorax. The heart is normal in size; the mediastinal contour is within normal limits. No acute osseous abnormalities are seen. IMPRESSION: Mild left basilar opacity likely reflects atelectasis, though mild pneumonia might have a similar appearance. Electronically Signed   By: Garald Balding M.D.   On: 06/22/2015 01:26   Ct Abdomen Pelvis W Contrast  06/22/2015  CLINICAL DATA:  Right-sided abdominal pain for 3 months EXAM: CT ABDOMEN AND PELVIS WITH CONTRAST TECHNIQUE: Multidetector CT imaging of the abdomen and pelvis was  performed using the standard protocol following bolus administration of intravenous contrast. CONTRAST:  65mL OMNIPAQUE IOHEXOL 300 MG/ML  SOLN COMPARISON:  None. FINDINGS: Lower chest:  Mild scarring in the lingula. Hepatobiliary: Small hypodense mass in the left hepatic lobe measuring fluid attenuation most consistent with a small cyst measuring 12 mm. No other hepatic mass. Normal gallbladder. Pancreas: Normal. Spleen: Normal. Adrenals/Urinary Tract: Normal adrenal glands. Normal kidneys. No urolithiasis or obstructive uropathy. Small left lateral bladder diverticulum. No other bladder abnormality. Stomach/Bowel: No bowel wall thickening or dilatation. No pneumatosis, pneumoperitoneum or portal venous gas. No abdominal or pelvic free fluid. Vascular/Lymphatic: Normal caliber abdominal aorta with atherosclerosis. No lymphadenopathy. Other: No fluid collection or hematoma. Musculoskeletal: No lytic or sclerotic osseous lesion. No acute osseous abnormality. IMPRESSION: 1. No acute abdominal or pelvic pathology. Electronically Signed   By: Kathreen Devoid   On: 06/22/2015 12:11     Assessment/Plan: 1.  AKI- in setting of acute illness, NSAIDs, N/V, and subsequent IV contrast exposure.  Expect his Scr to worsen before it will improve due to recent IV contrast.  Will order FeNa, UPEP/SPEP/serologies and complements.  Will also start gentle IVF's given contrast exposure and N/V.  Continue to follow UOP and daily Scr.  Will also check CPK levels due to complaints of muscle cramps to r/o rhabdo. 2. Lactic acidosis- possibly related to asthma exacerbation/pre-sepsis from PNA in setting of AKI.  Will order salicylate and acetaminophen levels.  Check ABG and follow with IVF"s.   3. Community acquired Pneumonia- on rocephin and azithromycin per primary svc.  Negative influenza and Strep pneumo screens, awaiting legionella. 4. Asthma exacerbation- on inhalers. 5. HTN- on low side.  Continue to follow for the development  of SIRS. 6. Hyperglycemia- no history of DM mentioned.  Possibly related to acute illness vs new diagnosis of DM.  Will check Hgb A1c, cortisol, and tsh 7. N/V for one episode- negative CT scan of abdomen and pelvis.     Bryan Sullivan 06/22/2015, 12:30 PM

## 2015-06-22 NOTE — H&P (Signed)
[facility] History and Physical  Bryan Sullivan [id number] DOB: 15-Aug-1945 DOA: 06/22/2015  Referring physician: Juliane Poot. PCP: Minerva Ends, MD  Specialists: None.  Chief Complaint: Shortness of breath.  HPI: Bryan Sullivan is a 70 y.o. male history of hypertension and COPD presents to the ER because of sudden onset of shortness of breath last night. Patient also has been nice and productive cough and on reaching ER patient had one episode of nausea vomiting. Denies any abdominal pain. Patient was found to be febrile in the ER and blood pressure was in the low normal. Chest x-ray shows possible infiltrates. Patient on exam is also wheezing bilaterally. Patient did not have any further episodes of vomiting and abdomen appears benign. Patient has been admitted for community acquired pneumonia and COPD exacerbation. Patient denies any chest pain headache visual symptoms.   Review of Systems: As presented in the history of presenting illness, rest negative.  Past Medical History  Diagnosis Date  . Asthma     As child   . Hypertension     Dx at age 53  . Enlarged prostate Dx 2001   Past Surgical History  Procedure Laterality Date  . Eye surgery    . Cataract extraction w/ intraocular lens  implant, bilateral Bilateral   . Corneal transplant Right    Social History:  reports that he quit smoking about 46 years ago. His smoking use included Cigarettes. He has never used smokeless tobacco. He reports that he does not drink alcohol or use illicit drugs. Where does patient live at home. Can patient participate in ADLs? Yes.  No Known Allergies  Family History:  Family History  Problem Relation Age of Onset  . Diabetes Mother   . Asthma Father   . Asthma Sister       Prior to Admission medications   Medication Sig Start Date End Date Taking? Authorizing Provider  albuterol (PROVENTIL HFA;VENTOLIN HFA) 108 (90 BASE) MCG/ACT inhaler Inhale 2 puffs into the  lungs every 2 (two) hours as needed for wheezing or shortness of breath (cough). 12/10/14  Yes Tresa Garter, MD  amLODipine (NORVASC) 5 MG tablet TAKE 1 TABLET BY MOUTH DAILY 04/29/15  Yes Josalyn Funches, MD  beclomethasone (QVAR) 80 MCG/ACT inhaler Inhale 2 puffs into the lungs 2 (two) times daily. 12/10/14  Yes Tresa Garter, MD  brimonidine (ALPHAGAN) 0.2 % ophthalmic solution Place 1 drop into the right eye 3 (three) times daily. 12/16/14  Yes Josalyn Funches, MD  dorzolamide (TRUSOPT) 2 % ophthalmic solution Place 1 drop into the right eye 3 (three) times daily. 12/16/14  Yes Josalyn Funches, MD  ibuprofen (ADVIL,MOTRIN) 600 MG tablet Take 1 tablet (600 mg total) by mouth every 8 (eight) hours as needed for moderate pain. 11/24/14  Yes Josalyn Funches, MD  latanoprost (XALATAN) 0.005 % ophthalmic solution Place 1 drop into the right eye at bedtime. 12/16/14  Yes Josalyn Funches, MD  prednisoLONE acetate (PRED FORTE) 1 % ophthalmic suspension Place 1 drop into the right eye 2 (two) times daily. 12/16/14  Yes Josalyn Funches, MD  tamsulosin (FLOMAX) 0.4 MG CAPS capsule TAKE 1 CAPSULE BY MOUTH DAILY. 03/24/15  Yes Josalyn Funches, MD  timolol (BETIMOL) 0.5 % ophthalmic solution Place 1 drop into both eyes 2 (two) times daily. 12/16/14  Yes Boykin Nearing, MD    Physical Exam: Filed Vitals:   06/22/15 0615 06/22/15 0630 06/22/15 0645 06/22/15 0700  BP: 119/59 103/58 102/55 111/58  Pulse: 118 115 110 111  Temp:      TempSrc:      Resp:      SpO2: 95% 96% 97% 97%     General:  Moderately built and nourished.  Eyes: Anicteric no pallor.  ENT: No discharge from the ears eyes nose or mouth.  Neck: No mass felt. No JVD appreciated.  Cardiovascular: S1-S2 heard.  Respiratory: Bilateral expiratory wheeze heard no crepitations.  Abdomen: Soft nontender bowel sounds present. No guarding or rigidity.  Skin: No rash.  Musculoskeletal: No edema.  Psychiatric: Appears  normal.  Neurologic: Alert and oriented to time place and person. Moves all extremities.  Labs on Admission:  Basic Metabolic Panel:  Recent Labs Lab 06/21/15 2356  NA 139  K 4.5  CL 101  CO2 28  GLUCOSE 131*  BUN 19  CREATININE 1.42*  CALCIUM 9.5   Liver Function Tests:  Recent Labs Lab 06/21/15 2356  AST 17  ALT 15*  ALKPHOS 66  BILITOT 0.4  PROT 6.9  ALBUMIN 3.9    Recent Labs Lab 06/21/15 2356  LIPASE 30   No results for input(s): AMMONIA in the last 168 hours. CBC:  Recent Labs Lab 06/21/15 2356  WBC 7.8  HGB 15.0  HCT 45.3  MCV 86.6  PLT 210   Cardiac Enzymes: No results for input(s): CKTOTAL, CKMB, CKMBINDEX, TROPONINI in the last 168 hours.  BNP (last 3 results)  Recent Labs  09/05/14 1617  BNP 31.2    ProBNP (last 3 results) No results for input(s): PROBNP in the last 8760 hours.  CBG: No results for input(s): GLUCAP in the last 168 hours.  Radiological Exams on Admission: Dg Chest 2 View  06/22/2015  CLINICAL DATA:  Acute onset of chills, cough and mid chest pain. Initial encounter. EXAM: CHEST  2 VIEW COMPARISON:  Chest radiograph from 10/11/2014 FINDINGS: The lungs are well-aerated. Mild left basilar opacity likely reflects atelectasis, though mild pneumonia might have a similar appearance. There is no evidence of pleural effusion or pneumothorax. The heart is normal in size; the mediastinal contour is within normal limits. No acute osseous abnormalities are seen. IMPRESSION: Mild left basilar opacity likely reflects atelectasis, though mild pneumonia might have a similar appearance. Electronically Signed   By: Garald Balding M.D.   On: 06/22/2015 01:26      Assessment/Plan Principal Problem:   CAP (community acquired pneumonia) Active Problems:   Hypertension   COPD exacerbation ([place])   1. Community acquired pneumonia - at this time patient has been placed on ceftriaxone and Zithromax. Check influenza PCR urine for strep  antigen and Legionella antigen. 2. COPD exacerbation - patient has been placed on Solu-Medrol Pulmicort and nebulizer. 3. Hypertension -  patient's blood pressures in the low normal patient is on amlodipine. We'll continue with fluids and if patient's blood pressure does not stabilize may have had to hold amlodipine. Follow lactic acid levels. 4. Chronic kidney disease stage 3  - creatinine appears to be at baseline. Follow metabolic panel.  5. Nausea vomiting - only one episode probably related to pneumonia. Closely observe. Abdomen appears benign. 6. Low back pain - patient is complaining of low back pain with numbness of the lower extremity. MRI of the L-spine has been ordered.   DVT ProphylaxisLovenox.   Code Status: Full code.   Family Communication:  Discussed with patient's son.  Disposition Plan: Admitted to inpatient.    Maylea Soria N. [facility] Pager [phone].  If 7PM-7AM, please contact night-coverage [email or web address] Password [facility] 06/22/2015, 7:56  AM        

## 2015-06-22 NOTE — Progress Notes (Addendum)
Subjective: Patient admitted this morning, see detailed H&P by Dr. Hal Hope 70 y.o. male history of hypertension and COPD presents to the ER because of sudden onset of shortness of breath last night. Patient also has been nice and productive cough and on reaching ER patient had one episode of nausea vomiting. Denies any abdominal pain. Patient was found to be febrile in the ER and blood pressure was in the low normal. Chest x-ray shows possible infiltrates. Patient on exam is also wheezing bilaterally. Patient did not have any further episodes of vomiting and abdomen appears benign. Patient has been admitted for community acquired pneumonia and COPD exacerbation. Patient denies any chest pain headache visual symptoms.  Patient lab work came back and showed lactate of 9.1. Filed Vitals:   06/22/15 0945 06/22/15 1035  BP: 110/58 132/76  Pulse: 97   Temp:    Resp: 29     Chest: Clear Bilaterally Heart : S1S2 RRR Abdomen: Soft, nontender Ext : No edema Neuro: Alert, oriented x 3  A/P  Lactic acidosis ? Cause, will obtain CT abdomen and pelvis as patient came with nausea and vomiting. Also check salicylate and Tylenol level  Acute on chronic kidney disease Patient's BUN/creatinine last night was 19/1.42. Repeat level this morning around 9 AM is 23/1.87 CT scan abdomen pelvis with contrast was ordered before this lab was resulted We will get nephrology consultation  Pneumonia Chest x-ray showed pneumonia, patient currently on ceftriaxone and Zithromax.    [facility] [phone]

## 2015-06-23 DIAGNOSIS — I10 Essential (primary) hypertension: Secondary | ICD-10-CM

## 2015-06-23 DIAGNOSIS — J441 Chronic obstructive pulmonary disease with (acute) exacerbation: Secondary | ICD-10-CM

## 2015-06-23 LAB — ANTISTREPTOLYSIN O TITER: ASO: 33 IU/mL (ref 0.0–200.0)

## 2015-06-23 LAB — FOLATE RBC
Folate, Hemolysate: 340.8 ng/mL
Folate, RBC: 850 ng/mL (ref >498)
Hematocrit: 40.1 % (ref 37.5–51.0)

## 2015-06-23 LAB — PROTEIN ELECTROPHORESIS, SERUM
A/G Ratio: 1.1 (ref 0.7–1.7)
Albumin ELP: 3.5 g/dL (ref 2.9–4.4)
Alpha-1-Globulin: 0.2 g/dL (ref 0.0–0.4)
Alpha-2-Globulin: 0.7 g/dL (ref 0.4–1.0)
Beta Globulin: 1 g/dL (ref 0.7–1.3)
Gamma Globulin: 1.2 g/dL (ref 0.4–1.8)
Globulin, Total: 3.1 g/dL (ref 2.2–3.9)
Total Protein ELP: 6.6 g/dL (ref 6.0–8.5)

## 2015-06-23 LAB — RENAL FUNCTION PANEL
Albumin: 3.1 g/dL — ABNORMAL LOW (ref 3.5–5.0)
Anion gap: 12 (ref 5–15)
BUN: 24 mg/dL — ABNORMAL HIGH (ref 6–20)
CO2: 27 mmol/L (ref 22–32)
Calcium: 9.4 mg/dL (ref 8.9–10.3)
Chloride: 102 mmol/L (ref 101–111)
Creatinine, Ser: 1.26 mg/dL — ABNORMAL HIGH (ref 0.61–1.24)
GFR calc Af Amer: >60 mL/min (ref >60)
GFR calc non Af Amer: 55 mL/min — ABNORMAL LOW (ref >60)
Glucose, Bld: 183 mg/dL — ABNORMAL HIGH (ref 65–99)
Phosphorus: 3.3 mg/dL (ref 2.5–4.6)
Potassium: 4.4 mmol/L (ref 3.5–5.1)
Sodium: 141 mmol/L (ref 135–145)

## 2015-06-23 LAB — C4 COMPLEMENT: Complement C4, Body Fluid: 32 mg/dL (ref 14–44)

## 2015-06-23 LAB — LEGIONELLA ANTIGEN, URINE

## 2015-06-23 LAB — HEMOGLOBIN A1C
Hgb A1c MFr Bld: 6.1 % — ABNORMAL HIGH (ref 4.8–5.6)
Mean Plasma Glucose: 128 mg/dL

## 2015-06-23 LAB — GLOMERULAR BASEMENT MEMBRANE ANTIBODIES: GBM Ab: 9 units (ref 0–20)

## 2015-06-23 LAB — ANTI-DNA ANTIBODY, DOUBLE-STRANDED: ds DNA Ab: 1 IU/mL (ref 0–9)

## 2015-06-23 LAB — COMPLEMENT, TOTAL: Compl, Total (CH50): >60 U/mL (ref 42–60)

## 2015-06-23 LAB — LACTIC ACID, PLASMA: Lactic Acid, Venous: 2.2 mmol/L (ref 0.5–2.0)

## 2015-06-23 LAB — ANTINUCLEAR ANTIBODIES, IFA: ANA Ab, IFA: NEGATIVE

## 2015-06-23 LAB — C3 COMPLEMENT: C3 Complement: 114 mg/dL (ref 82–167)

## 2015-06-23 LAB — MPO/PR-3 (ANCA) ANTIBODIES
ANCA Proteinase 3: <3.5 U/mL (ref 0.0–3.5)
Myeloperoxidase Abs: <9 U/mL (ref 0.0–9.0)

## 2015-06-23 MED ORDER — INFLUENZA VAC SPLIT QUAD 0.5 ML IM SUSY: 0.5000 mL | PREFILLED_SYRINGE | INTRAMUSCULAR | Status: DC | PRN

## 2015-06-23 MED ORDER — PNEUMOCOCCAL VAC POLYVALENT 25 MCG/0.5ML IJ INJ: 0.5000 mL | INJECTION | INTRAMUSCULAR | Status: DC | PRN

## 2015-06-23 NOTE — Progress Notes (Signed)
Patient ID: Bryan Sullivan, male   DOB: [birth year], 70 y.o.   MRN: [id number] S:feels much better today O:BP 123/67 mmHg  Pulse 85  Temp(Src) 97.8 F (36.6 C) (Oral)  Resp 20  Wt 83.915 kg (185 lb)  SpO2 96%  Intake/Output Summary (Last 24 hours) at 06/23/15 [id number] Last data filed at 06/23/15 0645  Gross per 24 hour  Intake 1814.17 ml  Output    100 ml  Net 1714.17 ml   Intake/Output: I/O last 3 completed shifts: In: 1814.2 [P.O.:60; I.V.:1454.2; IV [id number] Out: 100 [Urine:100]  Intake/Output this shift:    Weight change:  Gen:WD WN AM in NAD CVS:no rub Resp:occ end-expiratory wheezes [id number] Ext:no edema   Recent Labs Lab 06/21/15 2356 06/22/15 0857 06/22/15 1517 06/23/15 0546  NA 139 137 139 141  K 4.5 3.5 4.0 4.4  CL 101 98* 102 102  CO2 28 19* 21* 27  GLUCOSE 131* 201* 241* 183*  BUN 19 23* 22* 24*  CREATININE 1.42* 1.87* 1.66* 1.26*  ALBUMIN 3.9 3.3* 3.3* 3.1*  CALCIUM 9.5 9.1 9.1 9.4  PHOS  --   --   --  3.3  AST 17 30 27   --   ALT 15* 18 18  --    Liver Function Tests:  Recent Labs Lab 06/21/15 2356 06/22/15 0857 06/22/15 1517 06/23/15 0546  AST 17 30 27   --   ALT 15* 18 18  --   ALKPHOS 66 51 48  --   BILITOT 0.4 0.4 0.5  --   PROT 6.9 6.6 6.5  --   ALBUMIN 3.9 3.3* 3.3* 3.1*    Recent Labs Lab 06/21/15 2356  LIPASE 30   No results for input(s): AMMONIA in the last 168 hours. CBC:  Recent Labs Lab 06/21/15 2356 06/22/15 0857  WBC 7.8 15.5*  NEUTROABS  --  14.7*  HGB 15.0 13.7  HCT 45.3 42.4  MCV 86.6 87.1  PLT 210 202   Cardiac Enzymes:  Recent Labs Lab 06/22/15 0857 06/22/15 1517  CKTOTAL  --  71  TROPONINI 0.03  --    CBG: No results for input(s): GLUCAP in the last 168 hours.  Iron Studies: No results for input(s): IRON, TIBC, TRANSFERRIN, FERRITIN in the last 72 hours. Studies/Results: Dg Chest 2 View  06/22/2015  CLINICAL DATA:  Acute onset of chills, cough and mid chest pain. Initial encounter.  EXAM: CHEST  2 VIEW COMPARISON:  Chest radiograph from 10/11/2014 FINDINGS: The lungs are well-aerated. Mild left basilar opacity likely reflects atelectasis, though mild pneumonia might have a similar appearance. There is no evidence of pleural effusion or pneumothorax. The heart is normal in size; the mediastinal contour is within normal limits. No acute osseous abnormalities are seen. IMPRESSION: Mild left basilar opacity likely reflects atelectasis, though mild pneumonia might have a similar appearance. Electronically Signed   By: Garald Balding M.D.   On: 06/22/2015 01:26   Ct Abdomen Pelvis W Contrast  06/22/2015  CLINICAL DATA:  Right-sided abdominal pain for 3 months EXAM: CT ABDOMEN AND PELVIS WITH CONTRAST TECHNIQUE: Multidetector CT imaging of the abdomen and pelvis was performed using the standard protocol following bolus administration of intravenous contrast. CONTRAST:  34mL OMNIPAQUE IOHEXOL 300 MG/ML  SOLN COMPARISON:  None. FINDINGS: Lower chest:  Mild scarring in the lingula. Hepatobiliary: Small hypodense mass in the left hepatic lobe measuring fluid attenuation most consistent with a small cyst measuring 12 mm. No other hepatic mass. Normal gallbladder. Pancreas: Normal. Spleen: Normal. Adrenals/Urinary  Tract: Normal adrenal glands. Normal kidneys. No urolithiasis or obstructive uropathy. Small left lateral bladder diverticulum. No other bladder abnormality. Stomach/Bowel: No bowel wall thickening or dilatation. No pneumatosis, pneumoperitoneum or portal venous gas. No abdominal or pelvic free fluid. Vascular/Lymphatic: Normal caliber abdominal aorta with atherosclerosis. No lymphadenopathy. Other: No fluid collection or hematoma. Musculoskeletal: No lytic or sclerotic osseous lesion. No acute osseous abnormality. IMPRESSION: 1. No acute abdominal or pelvic pathology. Electronically Signed   By: Kathreen Devoid   On: 06/22/2015 12:11   . amLODipine  5 mg Oral Daily  . azithromycin  500 mg  Intravenous Q24H  . brimonidine  1 drop Right Eye TID  . budesonide (PULMICORT) nebulizer solution  0.25 mg Nebulization BID  . cefTRIAXone (ROCEPHIN)  IV  1 g Intravenous Q24H  . dorzolamide  1 drop Right Eye TID  . enoxaparin (LOVENOX) injection  40 mg Subcutaneous Q24H  . ipratropium-albuterol  3 mL Nebulization TID  . latanoprost  1 drop Right Eye QHS  . methylPREDNISolone (SOLU-MEDROL) injection  40 mg Intravenous Daily  . prednisoLONE acetate  1 drop Right Eye BID  . tamsulosin  0.4 mg Oral Daily  . timolol  1 drop Both Eyes BID    BMET    Component Value Date/Time   NA 141 06/23/2015 0546   K 4.4 06/23/2015 0546   CL 102 06/23/2015 0546   CO2 27 06/23/2015 0546   GLUCOSE 183* 06/23/2015 0546   BUN 24* 06/23/2015 0546   CREATININE 1.26* 06/23/2015 0546   CREATININE 1.04 10/11/2014 0951   CALCIUM 9.4 06/23/2015 0546   GFRNONAA 55* 06/23/2015 0546   GFRAA >60 06/23/2015 0546   CBC    Component Value Date/Time   WBC 15.5* 06/22/2015 0857   RBC 4.87 06/22/2015 0857   HGB 13.7 06/22/2015 0857   HCT 42.4 06/22/2015 0857   PLT 202 06/22/2015 0857   MCV 87.1 06/22/2015 0857   MCH 28.1 06/22/2015 0857   MCHC 32.3 06/22/2015 0857   RDW 13.1 06/22/2015 0857   LYMPHSABS 0.5* 06/22/2015 0857   MONOABS 0.3 06/22/2015 0857   EOSABS 0.0 06/22/2015 0857   BASOSABS 0.0 06/22/2015 0857     Assessment/Plan: 1. AKI- in setting of acute illness, NSAIDs, N/V, and subsequent IV contrast exposure. Expect his Scr to worsen before it will improve due to recent IV contrast.  1. FeNa was low consistent with pre-renal etiology and has responded to IVF's  2. UPEP/SPEP pending as well as serologies, CPK level WNL 3. Normal complements.  4. Continue to follow UOP and daily Scr.  5. REnal function markedly improved with hydration.  Will sign off and no need for follow up if renal function returns to normal. 6. Decrease NS IVF's 2. Lactic acidosis- possibly related to asthma  exacerbation/pre-sepsis from PNA in setting of AKI.  1. Salicylate and acetaminophen levels normal.  2. Lactic acid level already improving post hydration 3. Likely due to AKI and acute illness/asthma exacerbation/PNA.  3. Community acquired Pneumonia- on rocephin and azithromycin per primary svc. Negative influenza and Strep pneumo screens, awaiting legionella. 4. Asthma exacerbation- on inhalers. 5. HTN- on low side. Continue to follow for the development of SIRS. 6. Hyperglycemia- no history of DM mentioned. Possibly related to acute illness vs new diagnosis of DM. Will check Hgb A1c, cortisol, and tsh 7. N/V for one episode- negative CT scan of abdomen and pelvis.  [place]

## 2015-06-23 NOTE — Progress Notes (Signed)
24 hr Urine collected and sent it to lab.

## 2015-06-23 NOTE — Progress Notes (Signed)
[facility] PROGRESS NOTE  Jolyon Fischbeck [id number] DOB: Oct 03, 1945 DOA: 06/22/2015 PCP: Minerva Ends, MD  Assessment/Plan: 1. Community acquired pneumonia- continue Rocephin and Zithromax. Urinary strep pneumonia and lesion as well as Legionella antigen are negative. Blood cultures 2 negative so far. 2. Acute kidney injury- in the setting of NSAIDs, hypotension, dehydration with IV contrast exposure. Renal function has returned to normal with IV hydration 3. Hypertension- blood pressure is controlled, continue amlodipine. 4. BPH- continue tamsulosin 5. COPD- stable, start prednisone 40 mg on tapering dose. DuoNeb nebulizers 3 times a day  Code Status: Full code  Family Communication: Discussed with patient's son at bedside Disposition Plan: Pending improvement in respiratory status   Consultants:  Nephrology  Procedures:  None  Antibiotics:  Ceftriaxone  Zithromax  HPI/Subjective: 70 y.o. male history of hypertension and COPD presents to the ER because of sudden onset of shortness of breath last night. Patient also has been nice and productive cough and on reaching ER patient had one episode of nausea vomiting. Denies any abdominal pain. Patient was found to be febrile in the ER and blood pressure was in the low normal. Chest x-ray shows possible infiltrates. Patient on exam is also wheezing bilaterally. Patient did not have any further episodes of vomiting and abdomen appears benign. Patient has been admitted for community acquired pneumonia and COPD exacerbation. Patient denies any chest pain headache visual symptoms.  Yesterday patient's lab work showed a crit of 9.1, CT abdomen pelvis with contrast was ordered which was negative. Repeat BMP yesterday morning showed creatinine of 1.87. Nephrology was consulted. Patient was started on aggressive IV hydration and today's BUN/creatinine is 24/1.26. Lactate has come down to 2.2 This morning patient denies any  chest pain or shortness of breath  Objective: Filed Vitals:   06/23/15 0421 06/23/15 0844  BP: 123/67 137/64  Pulse: 85 97  Temp: 97.8 F (36.6 C) 98.2 F (36.8 C)  Resp: 20 18    Intake/Output Summary (Last 24 hours) at 06/23/15 1437 Last data filed at 06/23/15 1433  Gross per 24 hour  Intake 2054.17 ml  Output    100 ml  Net 1954.17 ml   Filed Weights   06/22/15 2046  Weight: 83.915 kg (185 lb)    Exam:   General:  Appears in no acute distress  Cardiovascular: S1-S2 regular  Respiratory: Clear to auscultation bilaterally  Abdomen: Soft, nontender, no organomegaly  Musculoskeletal: No cyanosis/clubbing/edema of the lower extremities   Data Reviewed: Basic Metabolic Panel:  Recent Labs Lab 06/21/15 2356 06/22/15 0857 06/22/15 1517 06/23/15 0546  NA 139 137 139 141  K 4.5 3.5 4.0 4.4  CL 101 98* 102 102  CO2 28 19* 21* 27  GLUCOSE 131* 201* 241* 183*  BUN 19 23* 22* 24*  CREATININE 1.42* 1.87* 1.66* 1.26*  CALCIUM 9.5 9.1 9.1 9.4  PHOS  --   --   --  3.3   Liver Function Tests:  Recent Labs Lab 06/21/15 2356 06/22/15 0857 06/22/15 1517 06/23/15 0546  AST 17 30 27   --   ALT 15* 18 18  --   ALKPHOS 66 51 48  --   BILITOT 0.4 0.4 0.5  --   PROT 6.9 6.6 6.5  --   ALBUMIN 3.9 3.3* 3.3* 3.1*    Recent Labs Lab 06/21/15 2356  LIPASE 30   No results for input(s): AMMONIA in the last 168 hours. CBC:  Recent Labs Lab 06/21/15 2356 06/22/15 0857  WBC 7.8 15.5*  NEUTROABS  --  14.7*  HGB 15.0 13.7  HCT 45.3 42.4  MCV 86.6 87.1  PLT 210 202   Cardiac Enzymes:  Recent Labs Lab 06/22/15 0857 06/22/15 1517  CKTOTAL  --  71  TROPONINI 0.03  --    BNP (last 3 results)  Recent Labs  09/05/14 1617  BNP 31.2      Recent Results (from the past 240 hour(s))  Culture, blood (routine x 2)     Status: None (Preliminary result)   Collection Time: 06/22/15  3:40 AM  Result Value Ref Range Status   Specimen Description BLOOD RIGHT  ARM  Final   Special Requests BOTTLES DRAWN AEROBIC AND ANAEROBIC 10ML  Final   Culture NO GROWTH 1 DAY  Final   Report Status PENDING  Incomplete  Culture, blood (routine x 2)     Status: None (Preliminary result)   Collection Time: 06/22/15  3:48 AM  Result Value Ref Range Status   Specimen Description BLOOD RIGHT HAND  Final   Special Requests IN PEDIATRIC BOTTLE 4ML  Final   Culture NO GROWTH 1 DAY  Final   Report Status PENDING  Incomplete     Studies: Dg Chest 2 View  06/22/2015  CLINICAL DATA:  Acute onset of chills, cough and mid chest pain. Initial encounter. EXAM: CHEST  2 VIEW COMPARISON:  Chest radiograph from 10/11/2014 FINDINGS: The lungs are well-aerated. Mild left basilar opacity likely reflects atelectasis, though mild pneumonia might have a similar appearance. There is no evidence of pleural effusion or pneumothorax. The heart is normal in size; the mediastinal contour is within normal limits. No acute osseous abnormalities are seen. IMPRESSION: Mild left basilar opacity likely reflects atelectasis, though mild pneumonia might have a similar appearance. Electronically Signed   By: Garald Balding M.D.   On: 06/22/2015 01:26   Ct Abdomen Pelvis W Contrast  06/22/2015  CLINICAL DATA:  Right-sided abdominal pain for 3 months EXAM: CT ABDOMEN AND PELVIS WITH CONTRAST TECHNIQUE: Multidetector CT imaging of the abdomen and pelvis was performed using the standard protocol following bolus administration of intravenous contrast. CONTRAST:  36mL OMNIPAQUE IOHEXOL 300 MG/ML  SOLN COMPARISON:  None. FINDINGS: Lower chest:  Mild scarring in the lingula. Hepatobiliary: Small hypodense mass in the left hepatic lobe measuring fluid attenuation most consistent with a small cyst measuring 12 mm. No other hepatic mass. Normal gallbladder. Pancreas: Normal. Spleen: Normal. Adrenals/Urinary Tract: Normal adrenal glands. Normal kidneys. No urolithiasis or obstructive uropathy. Small left lateral bladder  diverticulum. No other bladder abnormality. Stomach/Bowel: No bowel wall thickening or dilatation. No pneumatosis, pneumoperitoneum or portal venous gas. No abdominal or pelvic free fluid. Vascular/Lymphatic: Normal caliber abdominal aorta with atherosclerosis. No lymphadenopathy. Other: No fluid collection or hematoma. Musculoskeletal: No lytic or sclerotic osseous lesion. No acute osseous abnormality. IMPRESSION: 1. No acute abdominal or pelvic pathology. Electronically Signed   By: Kathreen Devoid   On: 06/22/2015 12:11    Scheduled Meds: . amLODipine  5 mg Oral Daily  . azithromycin  500 mg Intravenous Q24H  . brimonidine  1 drop Right Eye TID  . budesonide (PULMICORT) nebulizer solution  0.25 mg Nebulization BID  . cefTRIAXone (ROCEPHIN)  IV  1 g Intravenous Q24H  . dorzolamide  1 drop Right Eye TID  . enoxaparin (LOVENOX) injection  40 mg Subcutaneous Q24H  . ipratropium-albuterol  3 mL Nebulization TID  . latanoprost  1 drop Right Eye QHS  . methylPREDNISolone (SOLU-MEDROL) injection  40 mg Intravenous  Daily  . prednisoLONE acetate  1 drop Right Eye BID  . tamsulosin  0.4 mg Oral Daily  . timolol  1 drop Both Eyes BID   Continuous Infusions:   Principal Problem:   CAP (community acquired pneumonia) Active Problems:   Hypertension   COPD exacerbation ([place])    Time spent: 25 min    [place] [facility] Pager [phone]. If 7PM-7AM, please contact night-coverage at [email or web address], password [facility] 06/23/2015, 2:37 PM  LOS: 1 day

## 2015-06-23 NOTE — Progress Notes (Signed)
Utilization review completed. Akira Perusse, RN, BSN. 

## 2015-06-23 NOTE — Progress Notes (Signed)
Lab called in the morning for a critical lactic acid of 2.2, notified Dr. Darrick Meigs via She[facility] Evans.

## 2015-06-24 LAB — UIFE/LIGHT CHAINS/TP QN, 24-HR UR
% BETA, Urine: 0 %
ALPHA 1 URINE: 0 %
Albumin, U: 100 %
Alpha 2, Urine: 0 %
Free Kappa/Lambda Ratio: 16.65 — ABNORMAL HIGH (ref 2.04–10.37)
Free Lambda Lt Chains,Ur: 3.46 mg/L (ref 0.24–6.66)
Free Lt Chn Excr Rate: 57.6 mg/L — ABNORMAL HIGH (ref 1.35–24.19)
GAMMA GLOBULIN URINE: 0 %
Time: 24 hours
Total Protein, Urine-Ur/day: 180 mg/24 hr — ABNORMAL HIGH (ref 30.0–150.0)
Total Protein, Urine: 9 mg/dL
Volume, Urine: 2000 mL

## 2015-06-24 MED ORDER — PNEUMOCOCCAL VAC POLYVALENT 25 MCG/0.5ML IJ INJ
0.5000 mL | INJECTION | Freq: Once | INTRAMUSCULAR | Status: AC
  Administered 2015-06-24: 0.5 mL via INTRAMUSCULAR
  Filled 2015-06-24: qty 0.5

## 2015-06-24 MED ORDER — IPRATROPIUM-ALBUTEROL 0.5-2.5 (3) MG/3ML IN SOLN: 3.0000 mL | Freq: Four times a day (QID) | RESPIRATORY_TRACT | Status: DC | PRN

## 2015-06-24 MED ORDER — METHYLPREDNISOLONE SODIUM SUCC 125 MG IJ SOLR
80.0000 mg | Freq: Once | INTRAMUSCULAR | Status: AC
  Administered 2015-06-24: 80 mg via INTRAVENOUS
  Filled 2015-06-24: qty 2

## 2015-06-24 MED ORDER — INFLUENZA VAC SPLIT QUAD 0.5 ML IM SUSY
0.5000 mL | PREFILLED_SYRINGE | Freq: Once | INTRAMUSCULAR | Status: AC
  Administered 2015-06-24: 0.5 mL via INTRAMUSCULAR

## 2015-06-24 MED ORDER — PREDNISONE 10 MG PO TABS: ORAL_TABLET | ORAL | Status: DC

## 2015-06-24 MED ORDER — LEVOFLOXACIN 750 MG PO TABS: 750.0000 mg | ORAL_TABLET | Freq: Every day | ORAL | Status: DC

## 2015-06-24 MED FILL — predniSONE 10 MG TABS: 10 | 4 days supply | Qty: 10 | Fill #0

## 2015-06-24 MED FILL — levoFLOXacin 750 MG TABS: 750 | 5 days supply | Qty: 5 | Fill #0

## 2015-06-24 NOTE — Progress Notes (Signed)
Patient Discharge: Disposition: Patient discharged to home with family. Education: Reviewed the medications, prescriptions, follow-up appointments, and discharge instructions, give hand out information on flu, pneumonia, community acquired pneumonia and nebulization treatment and cleaning instructions.  Patient left with nebulization machine which I demonstrated to the family and the patient and showed how it works, understood and acknowledged.   IV: Discontinued before discharge. Telemetry: N/A Transportation: Patient transported in w/c out of the unit accompanied by the staff and family. Belongings: Patient took all his belongings with him.

## 2015-06-24 NOTE — Discharge Summary (Signed)
Physician Discharge Summary  Bryan Sullivan [id number] DOB: 08-Nov-1945 DOA: 06/22/2015  PCP: Minerva Ends, MD  Admit date: 06/22/2015 Discharge date: 06/24/2015  Time spent: 30* minutes  Recommendations for Outpatient Follow-up:  1. Follow up PCP in 2 weeks   Discharge Diagnoses:  Principal Problem:   CAP (community acquired pneumonia) Active Problems:   Hypertension   COPD exacerbation ([place])   Discharge Condition: Stable  Diet recommendation: Regular diet  Filed Weights   06/22/15 2046 06/23/15 2123  Weight: 83.915 kg (185 lb) 85.684 kg (188 lb 14.4 oz)    History of present illness:  70 y.o. male history of hypertension and COPD presents to the ER because of sudden onset of shortness of breath last night. Patient also has been nice and productive cough and on reaching ER patient had one episode of nausea vomiting. Denies any abdominal pain. Patient was found to be febrile in the ER and blood pressure was in the low normal. Chest x-ray shows possible infiltrates. Patient on exam is also wheezing bilaterally. Patient did not have any further episodes of vomiting and abdomen appears benign. Patient has been admitted for community acquired pneumonia and COPD exacerbation. Patient denies any chest pain headache visual symptoms.  Yesterday patient's lab work showed a crit of 9.1, CT abdomen pelvis with contrast was ordered which was negative. Repeat BMP yesterday morning showed creatinine of 1.87. Nephrology was consulted. Patient was started on aggressive IV hydration and today's BUN/creatinine is 24/1.26. Lactate has come down to 2.2 This morning patient denies any chest pain or shortness of breath  Hospital Course:  1. Community acquired pneumonia- patient was started on  Rocephin and Zithromax. Urinary strep pneumonia and  Legionella antigen are negative. Blood cultures 2 negative so far. We'll discharge on Levaquin 750 mg by mouth daily for 5 more days. 2. Acute  kidney injury- in the setting of NSAIDs, hypotension, dehydration with IV contrast exposure. Renal function has returned to normal with IV hydration. Creatinine down to 1.26 3. Hypertension- blood pressure is controlled, continue amlodipine. 4. BPH- continue tamsulosin 5. COPD- will give 1 dose of Solu-Medrol 80 mg IV 1 today and sent home on prednisone taper for 5 more days. Also start DuoNeb nebulizers every 6 hours when necessary. 6. Hyperglycemia/prediabetes- patient's hemoglobin A1c 6.1, had mild elevation of blood glucose in the hospital due to the steroids. Will need to follow-up with the PCP to discuss plan for diabetes prevention and management.  Procedures:  None  Consultations:  Nephrology  Discharge Exam: Filed Vitals:   06/24/15 0438 06/24/15 1020  BP: 141/71 147/67  Pulse: 75 77  Temp: 98.1 F (36.7 C) 98.4 F (36.9 C)  Resp: 16 20    General: Appears in no acute distress Cardiovascular: S1-S2 regular Respiratory: Mild wheezing bilaterally  Discharge Instructions   Discharge Instructions    Diet - low sodium heart healthy    Complete by:  As directed      Increase activity slowly    Complete by:  As directed           Current Discharge Medication List    START taking these medications   Details  ipratropium-albuterol (DUONEB) 0.5-2.5 (3) MG/3ML SOLN Take 3 mLs by nebulization every 6 (six) hours as needed. Qty: 360 mL, Refills: 2    levofloxacin (LEVAQUIN) 750 MG tablet Take 1 tablet (750 mg total) by mouth daily. Qty: 5 tablet, Refills: 0    predniSONE (DELTASONE) 10 MG tablet Prednisone 40 mg po daily  x 1 day then Prednisone 30 mg po daily x 1 day then Prednisone 20 mg po daily x 1 day then Prednisone 10 mg daily x 1 day then stop... Qty: 10 tablet, Refills: 0      CONTINUE these medications which have NOT CHANGED   Details  albuterol (PROVENTIL HFA;VENTOLIN HFA) 108 (90 BASE) MCG/ACT inhaler Inhale 2 puffs into the lungs every 2 (two) hours  as needed for wheezing or shortness of breath (cough). Qty: 3 Inhaler, Refills: 3    amLODipine (NORVASC) 5 MG tablet TAKE 1 TABLET BY MOUTH DAILY Qty: 30 tablet, Refills: 2   Associated Diagnoses: Essential hypertension    beclomethasone (QVAR) 80 MCG/ACT inhaler Inhale 2 puffs into the lungs 2 (two) times daily. Qty: 3 Inhaler, Refills: 3    brimonidine (ALPHAGAN) 0.2 % ophthalmic solution Place 1 drop into the right eye 3 (three) times daily. Qty: 10 mL, Refills: 12   Associated Diagnoses: Glaucoma (increased eye pressure)    dorzolamide (TRUSOPT) 2 % ophthalmic solution Place 1 drop into the right eye 3 (three) times daily. Qty: 10 mL, Refills: 12   Associated Diagnoses: Glaucoma (increased eye pressure)    latanoprost (XALATAN) 0.005 % ophthalmic solution Place 1 drop into the right eye at bedtime. Qty: 2.5 mL, Refills: 12   Associated Diagnoses: Glaucoma (increased eye pressure)    prednisoLONE acetate (PRED FORTE) 1 % ophthalmic suspension Place 1 drop into the right eye 2 (two) times daily. Qty: 5 mL, Refills: 3   Associated Diagnoses: Glaucoma (increased eye pressure)    tamsulosin (FLOMAX) 0.4 MG CAPS capsule TAKE 1 CAPSULE BY MOUTH DAILY. Qty: 30 capsule, Refills: 3    timolol (BETIMOL) 0.5 % ophthalmic solution Place 1 drop into both eyes 2 (two) times daily. Qty: 5 mL, Refills: 12   Associated Diagnoses: Glaucoma (increased eye pressure)      STOP taking these medications     ibuprofen (ADVIL,MOTRIN) 600 MG tablet        No Known Allergies    The results of significant diagnostics from this hospitalization (including imaging, microbiology, ancillary and laboratory) are listed below for reference.    Significant Diagnostic Studies: Dg Chest 2 View  06/22/2015  CLINICAL DATA:  Acute onset of chills, cough and mid chest pain. Initial encounter. EXAM: CHEST  2 VIEW COMPARISON:  Chest radiograph from 10/11/2014 FINDINGS: The lungs are well-aerated. Mild left  basilar opacity likely reflects atelectasis, though mild pneumonia might have a similar appearance. There is no evidence of pleural effusion or pneumothorax. The heart is normal in size; the mediastinal contour is within normal limits. No acute osseous abnormalities are seen. IMPRESSION: Mild left basilar opacity likely reflects atelectasis, though mild pneumonia might have a similar appearance. Electronically Signed   By: Garald Balding M.D.   On: 06/22/2015 01:26   Ct Abdomen Pelvis W Contrast  06/22/2015  CLINICAL DATA:  Right-sided abdominal pain for 3 months EXAM: CT ABDOMEN AND PELVIS WITH CONTRAST TECHNIQUE: Multidetector CT imaging of the abdomen and pelvis was performed using the standard protocol following bolus administration of intravenous contrast. CONTRAST:  33mL OMNIPAQUE IOHEXOL 300 MG/ML  SOLN COMPARISON:  None. FINDINGS: Lower chest:  Mild scarring in the lingula. Hepatobiliary: Small hypodense mass in the left hepatic lobe measuring fluid attenuation most consistent with a small cyst measuring 12 mm. No other hepatic mass. Normal gallbladder. Pancreas: Normal. Spleen: Normal. Adrenals/Urinary Tract: Normal adrenal glands. Normal kidneys. No urolithiasis or obstructive uropathy. Small left lateral bladder  diverticulum. No other bladder abnormality. Stomach/Bowel: No bowel wall thickening or dilatation. No pneumatosis, pneumoperitoneum or portal venous gas. No abdominal or pelvic free fluid. Vascular/Lymphatic: Normal caliber abdominal aorta with atherosclerosis. No lymphadenopathy. Other: No fluid collection or hematoma. Musculoskeletal: No lytic or sclerotic osseous lesion. No acute osseous abnormality. IMPRESSION: 1. No acute abdominal or pelvic pathology. Electronically Signed   By: Kathreen Devoid   On: 06/22/2015 12:11    Microbiology: Recent Results (from the past 240 hour(s))  Culture, blood (routine x 2)     Status: None (Preliminary result)   Collection Time: 06/22/15  3:40 AM   Result Value Ref Range Status   Specimen Description BLOOD RIGHT ARM  Final   Special Requests BOTTLES DRAWN AEROBIC AND ANAEROBIC 10ML  Final   Culture NO GROWTH 1 DAY  Final   Report Status PENDING  Incomplete  Culture, blood (routine x 2)     Status: None (Preliminary result)   Collection Time: 06/22/15  3:48 AM  Result Value Ref Range Status   Specimen Description BLOOD RIGHT HAND  Final   Special Requests IN PEDIATRIC BOTTLE 4ML  Final   Culture NO GROWTH 1 DAY  Final   Report Status PENDING  Incomplete     Labs: Basic Metabolic Panel:  Recent Labs Lab 06/21/15 2356 06/22/15 0857 06/22/15 1517 06/23/15 0546  NA 139 137 139 141  K 4.5 3.5 4.0 4.4  CL 101 98* 102 102  CO2 28 19* 21* 27  GLUCOSE 131* 201* 241* 183*  BUN 19 23* 22* 24*  CREATININE 1.42* 1.87* 1.66* 1.26*  CALCIUM 9.5 9.1 9.1 9.4  PHOS  --   --   --  3.3   Liver Function Tests:  Recent Labs Lab 06/21/15 2356 06/22/15 0857 06/22/15 1517 06/23/15 0546  AST 17 30 27   --   ALT 15* 18 18  --   ALKPHOS 66 51 48  --   BILITOT 0.4 0.4 0.5  --   PROT 6.9 6.6 6.5  --   ALBUMIN 3.9 3.3* 3.3* 3.1*    Recent Labs Lab 06/21/15 2356  LIPASE 30   No results for input(s): AMMONIA in the last 168 hours. CBC:  Recent Labs Lab 06/21/15 2356 06/22/15 0857 06/22/15 1517  WBC 7.8 15.5*  --   NEUTROABS  --  14.7*  --   HGB 15.0 13.7  --   HCT 45.3 42.4 40.1  MCV 86.6 87.1  --   PLT 210 202  --    Cardiac Enzymes:  Recent Labs Lab 06/22/15 0857 06/22/15 1517  CKTOTAL  --  71  TROPONINI 0.03  --    BNP: BNP (last 3 results)  Recent Labs  09/05/14 1617  BNP 31.2        Signed:  Remona Boom S MD.  [facility] 06/24/2015, 10:31 AM

## 2015-06-24 NOTE — Care Management Note (Signed)
Case Management Note  Patient Details  Name: Bryan Sullivan MRN: [id number] Date of Birth: August 25, 1945  Subjective/Objective:      CM following for progression and d/c planning.              Action/Plan: 06/24/2015 Pt to received nebulizer machine , this CM noted that pt is uninsured. [place] letter given to pt son and explained, pt uses [facility] for medications.   Expected Discharge Date:      06/24/2015            Expected Discharge Plan:  Home/Self Care  In-House Referral:  NA  Discharge planning Services  [place] Program  Post Acute Care Choice:  Durable Medical Equipment Choice offered to:     DME Arranged:  Nebulizer machine DME Agency:  [place]:  NA [place] Agency:  NA  Status of Service:  Completed, signed off  Medicare Important Message Given:    Date Medicare IM Given:    Medicare IM give by:    Date Additional Medicare IM Given:    Additional Medicare Important Message give by:     If discussed at [place] of Stay Meetings, dates discussed:    Additional Comments:  Adron Bene, RN 06/24/2015, 11:38 AM

## 2015-06-27 ENCOUNTER — Other Ambulatory Visit: Payer: Self-pay | Admitting: Family Medicine

## 2015-06-27 LAB — CULTURE, BLOOD (ROUTINE X 2)
Culture: NO GROWTH
Culture: NO GROWTH

## 2015-06-27 MED FILL — BRIMONIDINE 0.2% EYE DROP: 0.2 | 33 days supply | Qty: 10 | Fill #6

## 2015-06-27 MED FILL — TAMSULOSIN HCL 0.4 MG CAP: 0.4 | 30 days supply | Qty: 30 | Fill #3

## 2015-06-27 MED FILL — DORZOLAMIDE HCL 2% EYE DROP: 2 | 30 days supply | Qty: 10 | Fill #5

## 2015-06-27 MED FILL — TIMOLOL 0.5% EYE DROPS: 0.5 | 30 days supply | Qty: 5 | Fill #5

## 2015-06-27 MED FILL — $Qvar 80mcg Oral Inhaler: 30 days supply | Qty: 1 | Fill #5

## 2015-06-27 MED FILL — ?AMLODIPINE BESYLATE 5 MG T: 5 | 30 days supply | Qty: 30 | Fill #2

## 2015-06-27 MED FILL — $PROAIR HFA 90 MCG INHALER: 90 MCG | 29 days supply | Qty: 1 | Fill #5

## 2015-06-27 MED FILL — IPRAT-ALBUT 0.5-3(2.5) MG/3: 0.5-2.5 (3) | 30 days supply | Qty: 360 | Fill #0

## 2015-06-27 MED FILL — LATANOPROST 0.005% EYE DRP: 0.005 | 30 days supply | Qty: 3 | Fill #6

## 2015-06-30 ENCOUNTER — Other Ambulatory Visit: Payer: Self-pay | Admitting: Family Medicine

## 2015-07-14 ENCOUNTER — Encounter: Payer: Self-pay | Admitting: Family Medicine

## 2015-07-14 ENCOUNTER — Other Ambulatory Visit: Payer: Self-pay | Admitting: Family Medicine

## 2015-07-14 ENCOUNTER — Ambulatory Visit: Payer: Self-pay | Attending: Family Medicine | Admitting: Family Medicine

## 2015-07-14 VITALS — BP 134/72 | HR 80 | Temp 97.0°F | Resp 16 | Ht 70.0 in | Wt 184.0 lb

## 2015-07-14 DIAGNOSIS — H409 Unspecified glaucoma: Secondary | ICD-10-CM

## 2015-07-14 DIAGNOSIS — I10 Essential (primary) hypertension: Secondary | ICD-10-CM

## 2015-07-14 DIAGNOSIS — Z79899 Other long term (current) drug therapy: Secondary | ICD-10-CM | POA: Insufficient documentation

## 2015-07-14 DIAGNOSIS — Z87891 Personal history of nicotine dependence: Secondary | ICD-10-CM | POA: Insufficient documentation

## 2015-07-14 DIAGNOSIS — M545 Low back pain, unspecified: Secondary | ICD-10-CM

## 2015-07-14 DIAGNOSIS — N400 Enlarged prostate without lower urinary tract symptoms: Secondary | ICD-10-CM

## 2015-07-14 DIAGNOSIS — J45909 Unspecified asthma, uncomplicated: Secondary | ICD-10-CM

## 2015-07-14 DIAGNOSIS — J449 Chronic obstructive pulmonary disease, unspecified: Secondary | ICD-10-CM

## 2015-07-14 DIAGNOSIS — Z23 Encounter for immunization: Secondary | ICD-10-CM

## 2015-07-14 DIAGNOSIS — J4520 Mild intermittent asthma, uncomplicated: Secondary | ICD-10-CM

## 2015-07-14 DIAGNOSIS — J4489 Other specified chronic obstructive pulmonary disease: Secondary | ICD-10-CM

## 2015-07-14 DIAGNOSIS — J189 Pneumonia, unspecified organism: Secondary | ICD-10-CM

## 2015-07-14 MED ORDER — TAMSULOSIN HCL 0.4 MG PO CAPS: 0.4000 mg | ORAL_CAPSULE | Freq: Every day | ORAL | Status: DC

## 2015-07-14 MED ORDER — LATANOPROST 0.005 % OP SOLN: 1.0000 [drp] | Freq: Every day | OPHTHALMIC | Status: DC

## 2015-07-14 MED ORDER — ALBUTEROL SULFATE HFA 108 (90 BASE) MCG/ACT IN AERS: 2.0000 | INHALATION_SPRAY | RESPIRATORY_TRACT | Status: DC | PRN

## 2015-07-14 MED ORDER — BECLOMETHASONE DIPROPIONATE 80 MCG/ACT IN AERS: 2.0000 | INHALATION_SPRAY | Freq: Two times a day (BID) | RESPIRATORY_TRACT | Status: DC

## 2015-07-14 MED ORDER — ACETAMINOPHEN-CODEINE #3 300-30 MG PO TABS: 1.0000 | ORAL_TABLET | Freq: Three times a day (TID) | ORAL | Status: DC | PRN

## 2015-07-14 MED ORDER — AMLODIPINE BESYLATE 5 MG PO TABS: 5.0000 mg | ORAL_TABLET | Freq: Every day | ORAL | Status: DC

## 2015-07-14 MED ORDER — BRIMONIDINE TARTRATE 0.2 % OP SOLN: 1.0000 [drp] | Freq: Three times a day (TID) | OPHTHALMIC | Status: DC

## 2015-07-14 MED ORDER — TIMOLOL HEMIHYDRATE 0.5 % OP SOLN: 1.0000 [drp] | Freq: Two times a day (BID) | OPHTHALMIC | Status: DC

## 2015-07-14 MED ORDER — DORZOLAMIDE HCL 2 % OP SOLN: 1.0000 [drp] | Freq: Three times a day (TID) | OPHTHALMIC | Status: DC

## 2015-07-14 MED ORDER — PREDNISOLONE ACETATE 1 % OP SUSP: 1.0000 [drp] | Freq: Two times a day (BID) | OPHTHALMIC | Status: DC

## 2015-07-14 MED FILL — PREDNISOLONE AC 1% EYE DROP: 1 | 30 days supply | Qty: 5 | Fill #0

## 2015-07-14 MED FILL — ACETAMINOPHEN/COD #3 TABLET: 300-30 | 10 days supply | Qty: 30 | Fill #0

## 2015-07-14 NOTE — Patient Instructions (Addendum)
Bryan Sullivan was seen today for asthma.  Diagnoses and all orders for this visit:  Glaucoma (increased eye pressure) -     prednisoLONE acetate (PRED FORTE) 1 % ophthalmic suspension; Place 1 drop into the right eye 2 (two) times daily. -     brimonidine (ALPHAGAN) 0.2 % ophthalmic solution; Place 1 drop into the right eye 3 (three) times daily. -     dorzolamide (TRUSOPT) 2 % ophthalmic solution; Place 1 drop into the right eye 3 (three) times daily. -     latanoprost (XALATAN) 0.005 % ophthalmic solution; Place 1 drop into the right eye at bedtime. -     timolol (BETIMOL) 0.5 % ophthalmic solution; Place 1 drop into both eyes 2 (two) times daily.  Essential hypertension -     amLODipine (NORVASC) 5 MG tablet; Take 1 tablet (5 mg total) by mouth daily.  BPH (benign prostatic hyperplasia) -     tamsulosin (FLOMAX) 0.4 MG CAPS capsule; Take 1 capsule (0.4 mg total) by mouth daily.  Bilateral low back pain without sciatica -     acetaminophen-codeine (TYLENOL #3) 300-30 MG tablet; Take 1 tablet by mouth every 8 (eight) hours as needed for moderate pain.  Asthma, mild intermittent, uncomplicated  Chronic obstructive airway disease with asthma (HCC) -     albuterol (PROVENTIL HFA;VENTOLIN HFA) 108 (90 Base) MCG/ACT inhaler; Inhale 2 puffs into the lungs every 2 (two) hours as needed for wheezing or shortness of breath (cough). -     beclomethasone (QVAR) 80 MCG/ACT inhaler; Inhale 2 puffs into the lungs 2 (two) times daily.  CAP (community acquired pneumonia)  Other orders -     Tdap vaccine greater than or equal to 7yo IM    F/u with pulmonologist,  Dr. Joya Gaskins for pulmonary function testing   Dr. Adrian Blackwater

## 2015-07-14 NOTE — Progress Notes (Signed)
F/U Asthma  Feeling good today  No pain today  No tobacco user  No suicidal thought in the past two weeks

## 2015-07-14 NOTE — Assessment & Plan Note (Signed)
Resolved

## 2015-07-14 NOTE — Progress Notes (Signed)
Subjective:  Patient ID: Bryan Sullivan, male    DOB: Oct 03, 1945  Age: 70 y.o. MRN: [id number]  CC: Asthma   HPI Rayson Babiak presents for    1. HFU CAP: he was hospitalized from 06/22/15 to 06/24/15 with CAP. He received Levaquin. He has completed the course. Former smoker. Received flu shot and pneumovax in the hospital. No HA, CP, SOB or cough.   2. Glaucoma: he is requesting med refills. He sees eye doctor in [place], no recent medication changes.   Social History  Substance Use Topics  . Smoking status: Former Smoker    Types: Cigarettes    Quit date: 09/21/1968  . Smokeless tobacco: Never Used     Comment: "quit smoking cigarettes in 1970"  . Alcohol Use: No    Outpatient Prescriptions Prior to Visit  Medication Sig Dispense Refill  . albuterol (PROVENTIL HFA;VENTOLIN HFA) 108 (90 BASE) MCG/ACT inhaler Inhale 2 puffs into the lungs every 2 (two) hours as needed for wheezing or shortness of breath (cough). 3 Inhaler 3  . amLODipine (NORVASC) 5 MG tablet TAKE 1 TABLET BY MOUTH DAILY 30 tablet 2  . beclomethasone (QVAR) 80 MCG/ACT inhaler Inhale 2 puffs into the lungs 2 (two) times daily. 3 Inhaler 3  . brimonidine (ALPHAGAN) 0.2 % ophthalmic solution Place 1 drop into the right eye 3 (three) times daily. 10 mL 12  . dorzolamide (TRUSOPT) 2 % ophthalmic solution Place 1 drop into the right eye 3 (three) times daily. 10 mL 12  . ipratropium-albuterol (DUONEB) 0.5-2.5 (3) MG/3ML SOLN Take 3 mLs by nebulization every 6 (six) hours as needed. 360 mL 2  . latanoprost (XALATAN) 0.005 % ophthalmic solution Place 1 drop into the right eye at bedtime. 2.5 mL 12  . levofloxacin (LEVAQUIN) 750 MG tablet Take 1 tablet (750 mg total) by mouth daily. 5 tablet 0  . prednisoLONE acetate (PRED FORTE) 1 % ophthalmic suspension Place 1 drop into the right eye 2 (two) times daily. 5 mL 3  . predniSONE (DELTASONE) 10 MG tablet Prednisone 40 mg po daily x 1 day then Prednisone 30 mg po  daily x 1 day then Prednisone 20 mg po daily x 1 day then Prednisone 10 mg daily x 1 day then stop... 10 tablet 0  . tamsulosin (FLOMAX) 0.4 MG CAPS capsule TAKE 1 CAPSULE BY MOUTH DAILY. 30 capsule 3  . timolol (BETIMOL) 0.5 % ophthalmic solution Place 1 drop into both eyes 2 (two) times daily. 5 mL 12   No facility-administered medications prior to visit.    ROS Review of Systems  Constitutional: Negative for fever, chills, fatigue and unexpected weight change.  Eyes: Negative for visual disturbance.  Respiratory: Negative for cough and shortness of breath.   Cardiovascular: Negative for chest pain, palpitations and leg swelling.  Gastrointestinal: Negative for nausea, vomiting, abdominal pain, diarrhea, constipation and blood in stool.  Endocrine: Negative for polydipsia, polyphagia and polyuria.  Musculoskeletal: Negative for myalgias, back pain, arthralgias, gait problem and neck pain.  Skin: Negative for rash.  Allergic/Immunologic: Negative for immunocompromised state.  Hematological: Negative for adenopathy. Does not bruise/bleed easily.  Psychiatric/Behavioral: Negative for suicidal ideas, sleep disturbance and dysphoric mood. The patient is not nervous/anxious.     Objective:  BP 134/72 mmHg  Pulse 80  Temp(Src) 97 F (36.1 C) (Oral)  Resp 16  Ht 5\' 10"  (1.778 m)  Wt 184 lb (83.462 kg)  BMI 26.40 kg/m2  SpO2 97%  BP/Weight 07/14/2015 06/24/2015 0000000  Systolic  BP Q000111Q Q000111Q -  Diastolic BP 72 67 -  Wt. (Lbs) 184 - 188.9  BMI 26.4 - -   Physical Exam  Constitutional: He appears well-developed and well-nourished. No distress.  HENT:  Head: Normocephalic and atraumatic.  Neck: Normal range of motion. Neck supple.  Cardiovascular: Normal rate, regular rhythm, normal heart sounds and intact distal pulses.   Pulmonary/Chest: Effort normal and breath sounds normal.  Musculoskeletal: He exhibits no edema.  Neurological: He is alert.  Skin: Skin is warm and dry. No  rash noted. No erythema.  Psychiatric: He has a normal mood and affect.     Assessment & Plan:  Magdaleno was seen today for asthma.  Diagnoses and all orders for this visit:  Glaucoma (increased eye pressure) -     prednisoLONE acetate (PRED FORTE) 1 % ophthalmic suspension; Place 1 drop into the right eye 2 (two) times daily. -     brimonidine (ALPHAGAN) 0.2 % ophthalmic solution; Place 1 drop into the right eye 3 (three) times daily. -     dorzolamide (TRUSOPT) 2 % ophthalmic solution; Place 1 drop into the right eye 3 (three) times daily. -     latanoprost (XALATAN) 0.005 % ophthalmic solution; Place 1 drop into the right eye at bedtime. -     timolol (BETIMOL) 0.5 % ophthalmic solution; Place 1 drop into both eyes 2 (two) times daily.  Essential hypertension -     amLODipine (NORVASC) 5 MG tablet; Take 1 tablet (5 mg total) by mouth daily.  BPH (benign prostatic hyperplasia) -     tamsulosin (FLOMAX) 0.4 MG CAPS capsule; Take 1 capsule (0.4 mg total) by mouth daily.  Bilateral low back pain without sciatica -     acetaminophen-codeine (TYLENOL #3) 300-30 MG tablet; Take 1 tablet by mouth every 8 (eight) hours as needed for moderate pain.  Asthma, mild intermittent, uncomplicated  Chronic obstructive airway disease with asthma (HCC) -     albuterol (PROVENTIL HFA;VENTOLIN HFA) 108 (90 Base) MCG/ACT inhaler; Inhale 2 puffs into the lungs every 2 (two) hours as needed for wheezing or shortness of breath (cough). -     beclomethasone (QVAR) 80 MCG/ACT inhaler; Inhale 2 puffs into the lungs 2 (two) times daily.  CAP (community acquired pneumonia)  Other orders -     Tdap vaccine greater than or equal to 7yo IM   There are no diagnoses linked to this encounter.  No orders of the defined types were placed in this encounter.    Follow-up: No Follow-up on file.   Boykin Nearing MD

## 2015-07-14 NOTE — Assessment & Plan Note (Signed)
A: COPD  P: Continue inhalers F/u with pulmonologist for PFTs

## 2015-08-01 ENCOUNTER — Other Ambulatory Visit: Payer: Self-pay | Admitting: *Deleted

## 2015-08-01 DIAGNOSIS — H409 Unspecified glaucoma: Secondary | ICD-10-CM

## 2015-08-01 MED ORDER — DORZOLAMIDE HCL 2 % OP SOLN: 1.0000 [drp] | Freq: Three times a day (TID) | OPHTHALMIC | Status: DC

## 2015-08-11 MED FILL — QVAR 80 MCG ORAL INHALER: 80 | 30 days supply | Qty: 9 | Fill #0

## 2015-08-11 MED FILL — AMLODIPINE BESYLATE 5 MG TA: 5 | 30 days supply | Qty: 30 | Fill #0

## 2015-08-11 MED FILL — VENTOLIN HFA 90 MCG INHALER: 108 (90 BAS | 30 days supply | Qty: 18 | Fill #0

## 2015-08-11 MED FILL — TAMSULOSIN HCL 0.4 MG CAP: 0.4 | 90 days supply | Qty: 90 | Fill #0

## 2015-08-11 MED FILL — BRIMONIDINE 0.2% EYE DROP: 0.2 | 30 days supply | Qty: 10 | Fill #0

## 2015-08-11 MED FILL — TIMOLOL 0.5% EYE DROPS: 0.5 | 30 days supply | Qty: 15 | Fill #0

## 2015-08-11 MED FILL — LATANOPROST 0.005% EYE DRP: 0.005 | 30 days supply | Qty: 3 | Fill #0

## 2015-08-11 MED FILL — PREDNISOLONE AC 1% EYE DROP: 1 | 30 days supply | Qty: 5 | Fill #1

## 2015-08-11 MED FILL — DORZOLAMIDE HCL 2% EYE DROP: 2 | 30 days supply | Qty: 10 | Fill #0

## 2015-08-16 MED FILL — $PROAIR HFA 90 MCG INHALER: 90 MCG | 29 days supply | Qty: 1 | Fill #6

## 2015-08-25 MED FILL — FLUOROMETHOLONE 0.1% DROPS: 0.1 | 30 days supply | Qty: 5 | Fill #0

## 2015-09-06 MED FILL — BRIMONIDINE 0.2% EYE DROP: 0.2 | 30 days supply | Qty: 10 | Fill #1

## 2015-09-06 MED FILL — $Qvar 80mcg Oral Inhaler: 30 days supply | Qty: 1 | Fill #1

## 2015-09-06 MED FILL — !VENTOLIN HFA INHALER: 108 (90 BAS | 30 days supply | Qty: 18 | Fill #1

## 2015-09-06 MED FILL — LATANOPROST 0.005% EYE DRP: 0.005 | 30 days supply | Qty: 3 | Fill #1

## 2015-09-06 MED FILL — AMLODIPINE BESYLATE 5 MG TA: 5 | 30 days supply | Qty: 30 | Fill #1

## 2015-09-06 MED FILL — TIMOLOL 0.5% EYE DROPS: 0.5 | 30 days supply | Qty: 15 | Fill #1

## 2015-09-06 MED FILL — DORZOLAMIDE HCL 2% EYE DROP: 2 | 30 days supply | Qty: 10 | Fill #1

## 2015-09-06 MED FILL — PREDNISOLONE AC 1% EYE DROP: 1 | 30 days supply | Qty: 5 | Fill #2

## 2015-09-21 MED FILL — FLUOROMETHOLONE 0.1% DROPS: 0.1 | 30 days supply | Qty: 5 | Fill #1

## 2015-10-12 MED FILL — !VENTOLIN HFA INHALER: 108 (90 BAS | 30 days supply | Qty: 18 | Fill #2

## 2015-10-12 MED FILL — LATANOPROST 0.005% EYE DRP: 0.005 | 30 days supply | Qty: 3 | Fill #2

## 2015-10-12 MED FILL — DORZOLAMIDE HCL 2% EYE DROP: 2 | 30 days supply | Qty: 10 | Fill #2

## 2015-10-12 MED FILL — $Qvar 80mcg Oral Inhaler: 30 days supply | Qty: 1 | Fill #2

## 2015-10-12 MED FILL — AMLODIPINE BESYLATE 5 MG TA: 5 | 30 days supply | Qty: 30 | Fill #2

## 2015-10-12 MED FILL — PREDNISOLONE AC 1% EYE DROP: 1 | 30 days supply | Qty: 5 | Fill #3

## 2015-10-12 MED FILL — BRIMONIDINE 0.2% EYE DROP: 0.2 | 67 days supply | Qty: 10 | Fill #2

## 2015-10-12 MED FILL — TIMOLOL 0.5% EYE DROPS: 0.5 | 30 days supply | Qty: 15 | Fill #2

## 2015-10-14 MED FILL — FLUOROMETHOLONE 0.1% DROPS: 0.1 | 30 days supply | Qty: 5 | Fill #2

## 2015-11-17 MED FILL — QVAR 80 MCG ORAL INHALER: 80 | 30 days supply | Qty: 9 | Fill #3

## 2015-11-17 MED FILL — BRIMONIDINE 0.2% EYE DROP: 0.2 | 67 days supply | Qty: 10 | Fill #3

## 2015-11-17 MED FILL — FLUOROMETHOLONE 0.1% DROPS: 0.1 | 60 days supply | Qty: 10 | Fill #3

## 2015-11-17 MED FILL — AMLODIPINE BESYLATE 5 MG TA: 5 | 90 days supply | Qty: 90 | Fill #3

## 2015-11-17 MED FILL — PROAIR HFA 90 MCG INHALER: 108 (90 BAS | 29 days supply | Qty: 9 | Fill #7

## 2015-11-17 MED FILL — LATANOPROST 0.005% EYE DRP: 0.005 | 30 days supply | Qty: 3 | Fill #3

## 2015-11-17 MED FILL — TIMOLOL 0.5% EYE DROPS: 0.5 | 30 days supply | Qty: 15 | Fill #3

## 2015-11-17 MED FILL — IPRAT-ALBUT 0.5-3(2.5) MG/3: 0.5-2.5 (3) | 30 days supply | Qty: 360 | Fill #1

## 2015-11-17 MED FILL — DORZOLAMIDE HCL 2% EYE DRP: 2 | 60 days supply | Qty: 20 | Fill #3

## 2015-11-17 MED FILL — TAMSULOSIN HCL 0.4 MG CAP: 0.4 | 90 days supply | Qty: 90 | Fill #1

## 2015-11-21 MED FILL — LATANOPROST 0.005% EYE DRP: 0.005 | 30 days supply | Qty: 3 | Fill #0

## 2015-12-13 ENCOUNTER — Other Ambulatory Visit: Payer: Self-pay | Admitting: *Deleted

## 2015-12-13 DIAGNOSIS — J449 Chronic obstructive pulmonary disease, unspecified: Secondary | ICD-10-CM

## 2015-12-13 MED ORDER — BECLOMETHASONE DIPROPIONATE 80 MCG/ACT IN AERS: 2.0000 | INHALATION_SPRAY | Freq: Two times a day (BID) | RESPIRATORY_TRACT | Status: DC

## 2015-12-13 MED ORDER — ALBUTEROL SULFATE HFA 108 (90 BASE) MCG/ACT IN AERS: 2.0000 | INHALATION_SPRAY | RESPIRATORY_TRACT | Status: DC | PRN

## 2015-12-13 NOTE — Telephone Encounter (Signed)
PASS PROGRAM 

## 2016-03-14 ENCOUNTER — Other Ambulatory Visit: Payer: Self-pay | Admitting: Internal Medicine

## 2016-03-14 MED FILL — BRIMONIDINE 0.2% EYE DROP: 0.2 | 67 days supply | Qty: 10 | Fill #4

## 2016-03-14 MED FILL — IPRAT-ALBUT 0.5-3(2.5) MG/3: 0.5-2.5 (3) | 30 days supply | Qty: 360 | Fill #2

## 2016-03-14 MED FILL — AMLODIPINE BESYLATE 5 MG TA: 5 | 90 days supply | Qty: 90 | Fill #4

## 2016-03-14 MED FILL — FLUOROMETHOLONE 0.1% DROPS: 0.1 | 59 days supply | Qty: 10 | Fill #0

## 2016-03-14 MED FILL — LATANOPROST 0.005% EYE DRP: 0.005 | 30 days supply | Qty: 3 | Fill #1

## 2016-03-14 MED FILL — ?TAMSULOSIN HCL 0.4 MG CAP: 0.4 | 90 days supply | Qty: 90 | Fill #2

## 2016-03-14 MED FILL — QVAR 80 MCG ORAL INHALER: 80 | 30 days supply | Qty: 9 | Fill #4

## 2016-03-14 MED FILL — TIMOLOL 0.5% EYE DROPS: 0.5 | 30 days supply | Qty: 5 | Fill #0

## 2016-03-14 MED FILL — $PROAIR HFA 90 MCG INHALER: 90 MCG | 20 days supply | Qty: 1 | Fill #0

## 2016-03-14 MED FILL — DORZOLAMIDE HCL 2% EYE DROP: 2 | 30 days supply | Qty: 10 | Fill #0

## 2016-04-15 IMAGING — CT CT ABD-PELV W/ CM
2 of 5 series · 10 of 46 positions shown, 11 images · IV contrast (Iodine)
Comparison: None.

CLINICAL DATA: Right-sided abdominal pain for 3 months

EXAM:
CT ABDOMEN AND PELVIS WITH CONTRAST
TECHNIQUE: Multidetector CT imaging of the abdomen and pelvis was performed
using the standard protocol following bolus administration of
intravenous contrast.
CONTRAST:  80mL OMNIPAQUE IOHEXOL 300 MG/ML  SOLN

[Series 201: routine, idose (2) · axial · 0.83mm/px · z∈[+163,+523]mm · 7 of 96 slices shown, 8 images]
[im 12/96  soft-tissue]
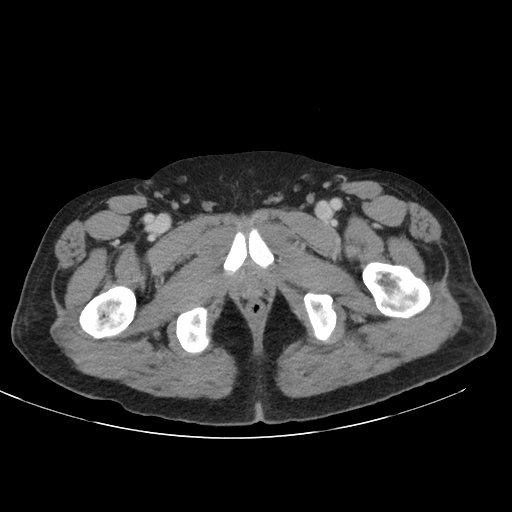
[im 12/96  bone]
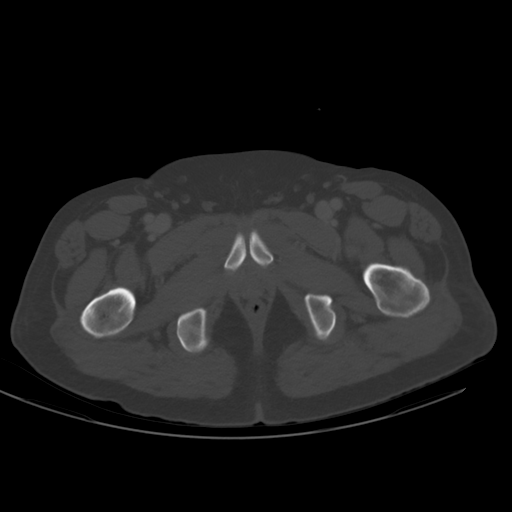
[im 23/96  soft-tissue]
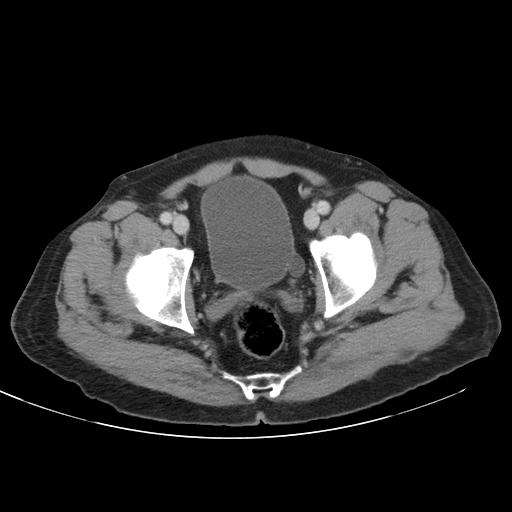
[im 34/96  soft-tissue]
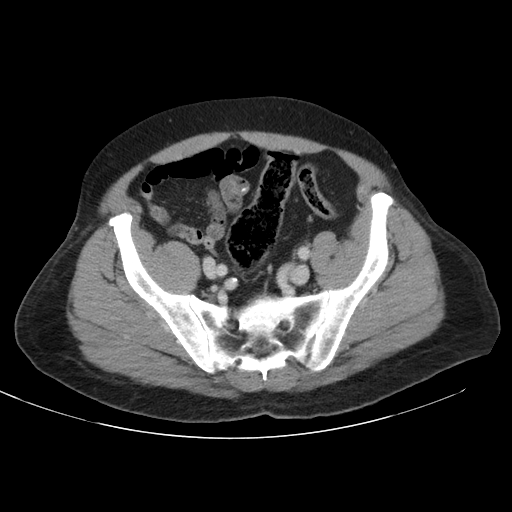
[im 51/96  soft-tissue]
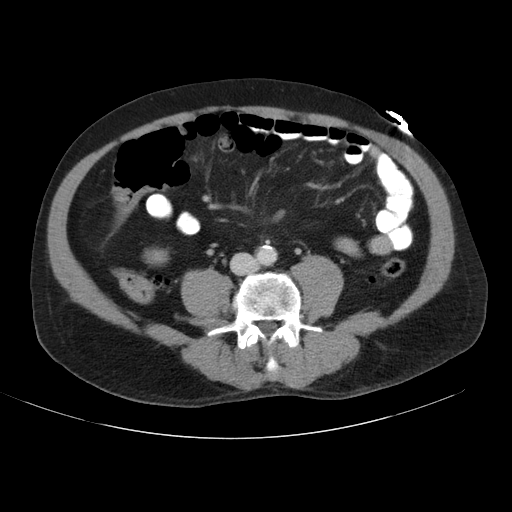
[im 62/96  soft-tissue]
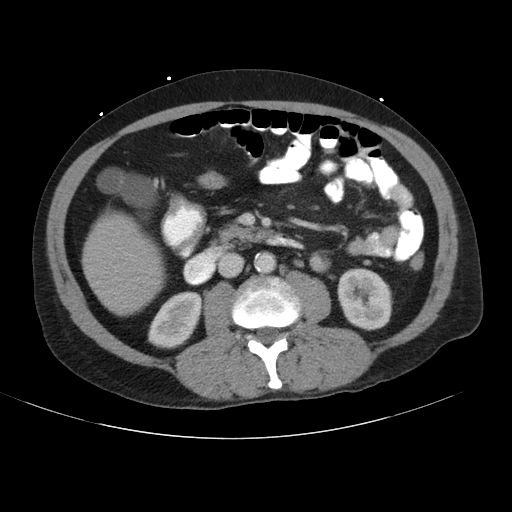
[im 73/96  soft-tissue]
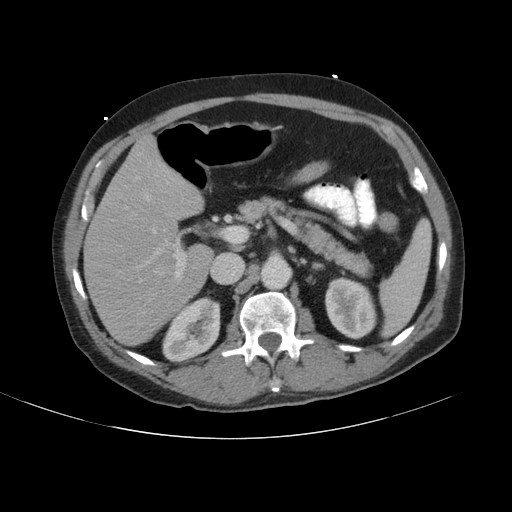
[im 84/96  soft-tissue]
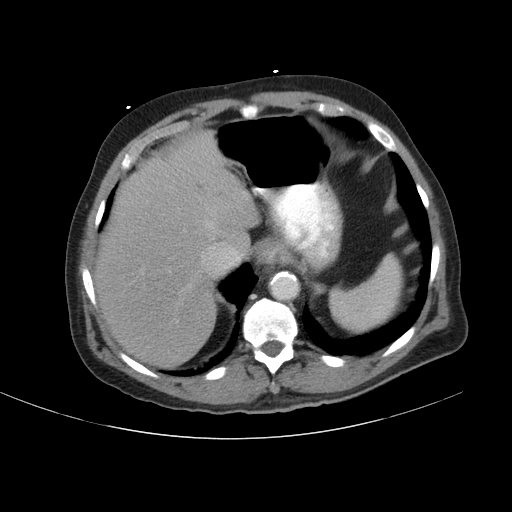

[Series 203: coronals, idose (2) · coronal · 0.45mm/px · 3 of 132 slices shown]
[im 44/132  soft-tissue]
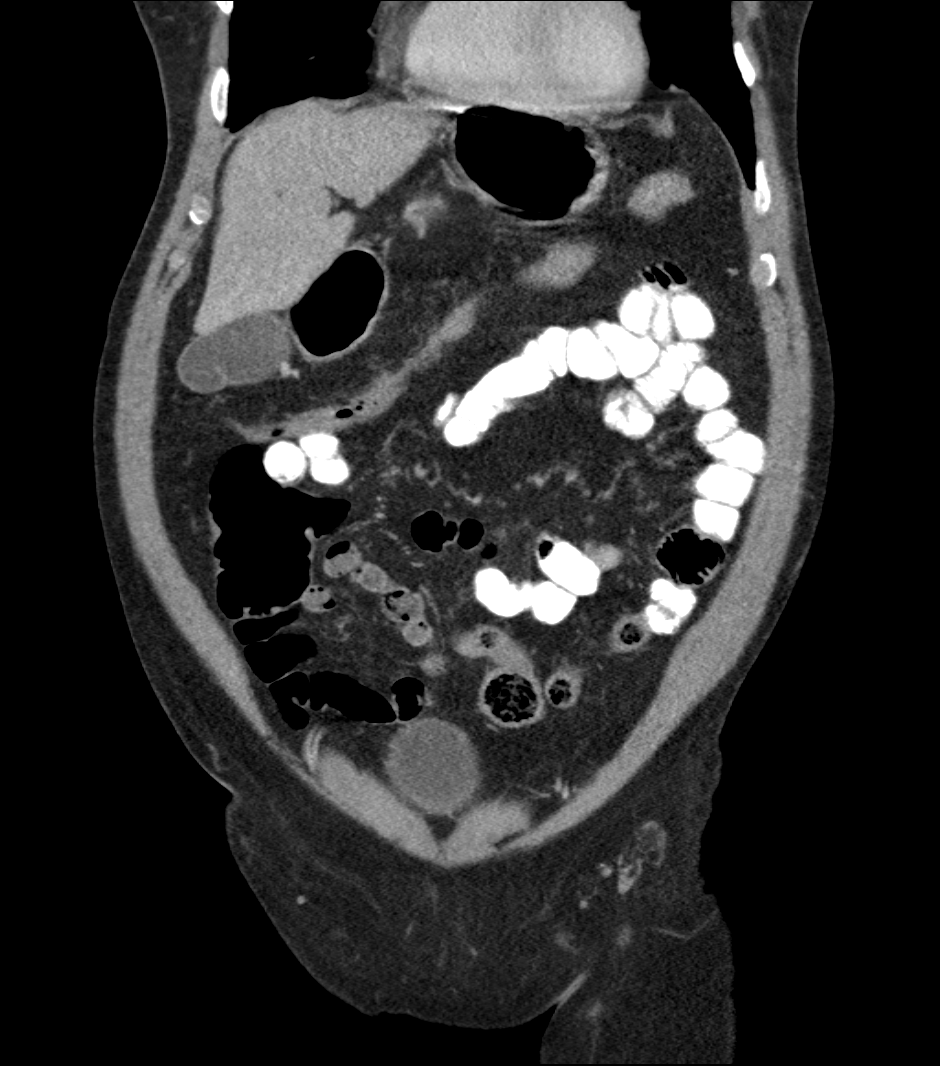
[im 59/132  soft-tissue]
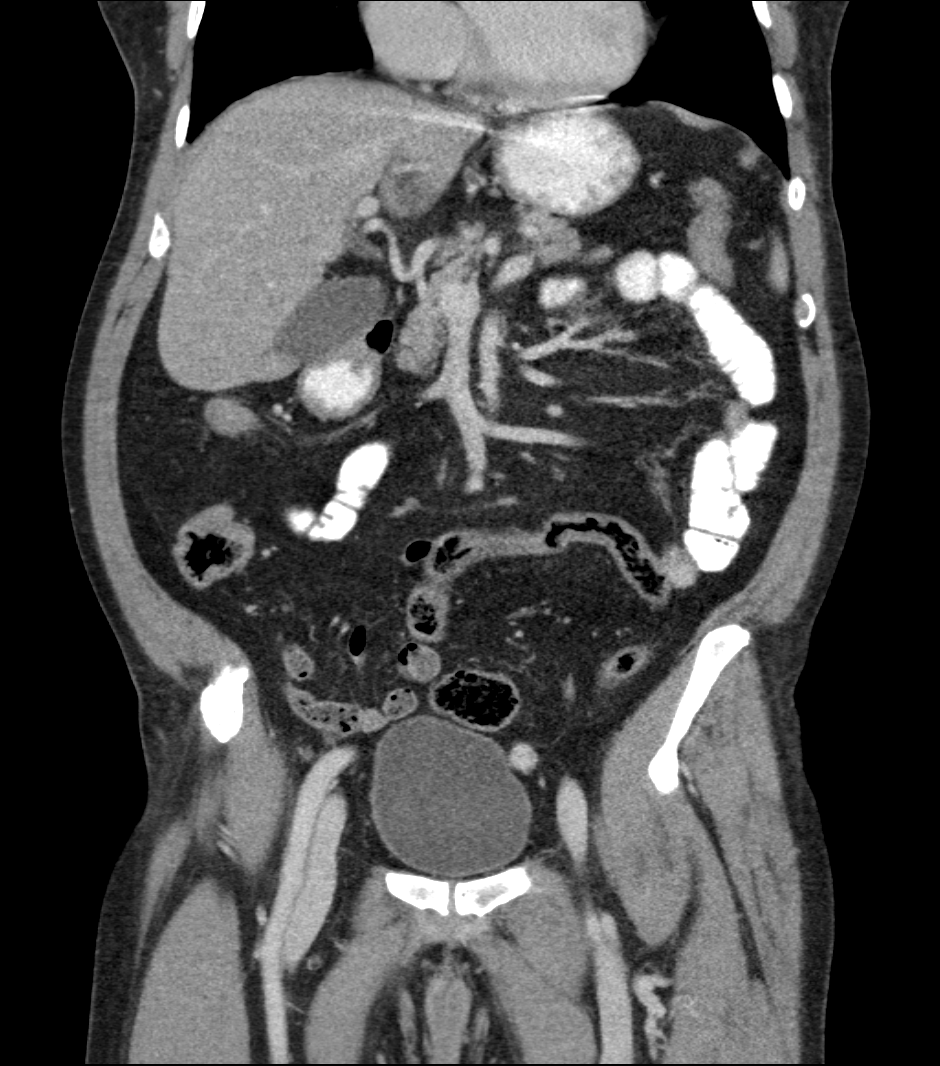
[im 73/132  soft-tissue]
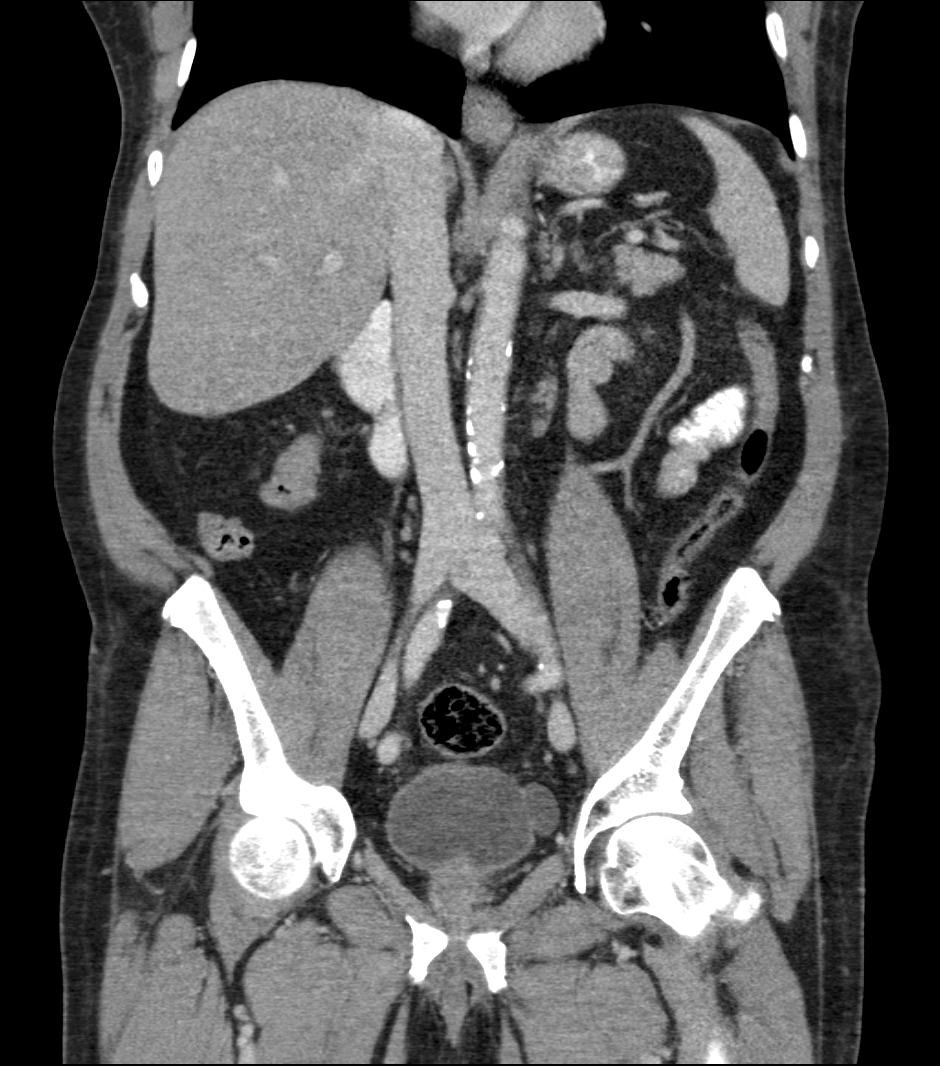

[10 of 46 positions shown; findings below may reference images not displayed]

FINDINGS: Lower chest:  Mild scarring in the lingula.

Hepatobiliary: Small hypodense mass in the left hepatic lobe
measuring fluid attenuation most consistent with a small cyst
measuring 12 mm. No other hepatic mass. Normal gallbladder.

Pancreas: Normal.

Spleen: Normal.

Adrenals/Urinary Tract: Normal adrenal glands. Normal kidneys. No
urolithiasis or obstructive uropathy. Small left lateral bladder
diverticulum. No other bladder abnormality.

Stomach/Bowel: No bowel wall thickening or dilatation. No
pneumatosis, pneumoperitoneum or portal venous gas. No abdominal or
pelvic free fluid.

Vascular/Lymphatic: Normal caliber abdominal aorta with
atherosclerosis. No lymphadenopathy.

Other: No fluid collection or hematoma.

Musculoskeletal: No lytic or sclerotic osseous lesion. No acute
osseous abnormality.
IMPRESSION: 1. No acute abdominal or pelvic pathology.

## 2016-04-15 IMAGING — CR DG CHEST 2V
2 series · 2 of 2 positions shown · non-contrast
Comparison: Chest radiograph from 10/11/2014

CLINICAL DATA: Acute onset of chills, cough and mid chest pain.
Initial encounter.

EXAM:
CHEST  2 VIEW

[chest pa]
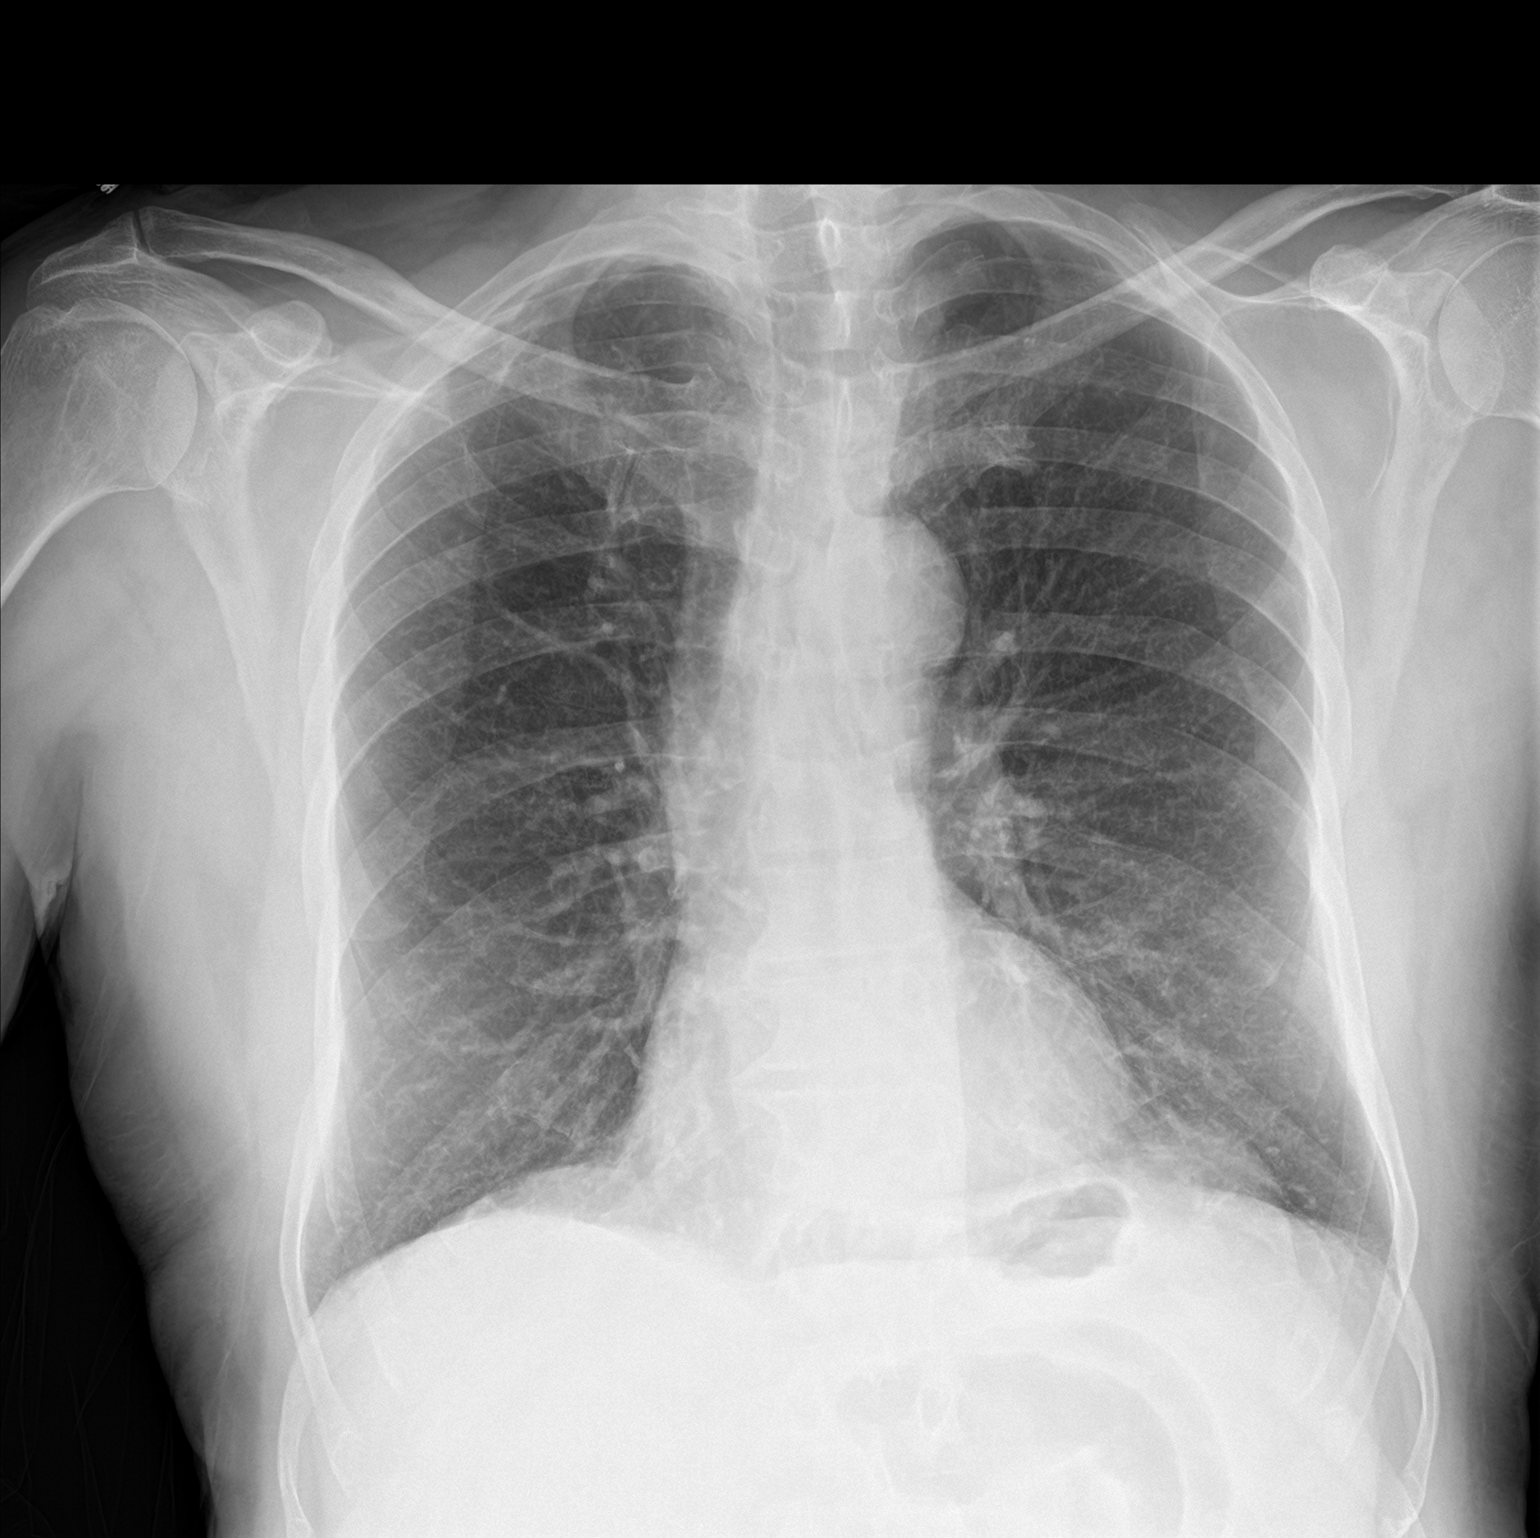

[chest lat]
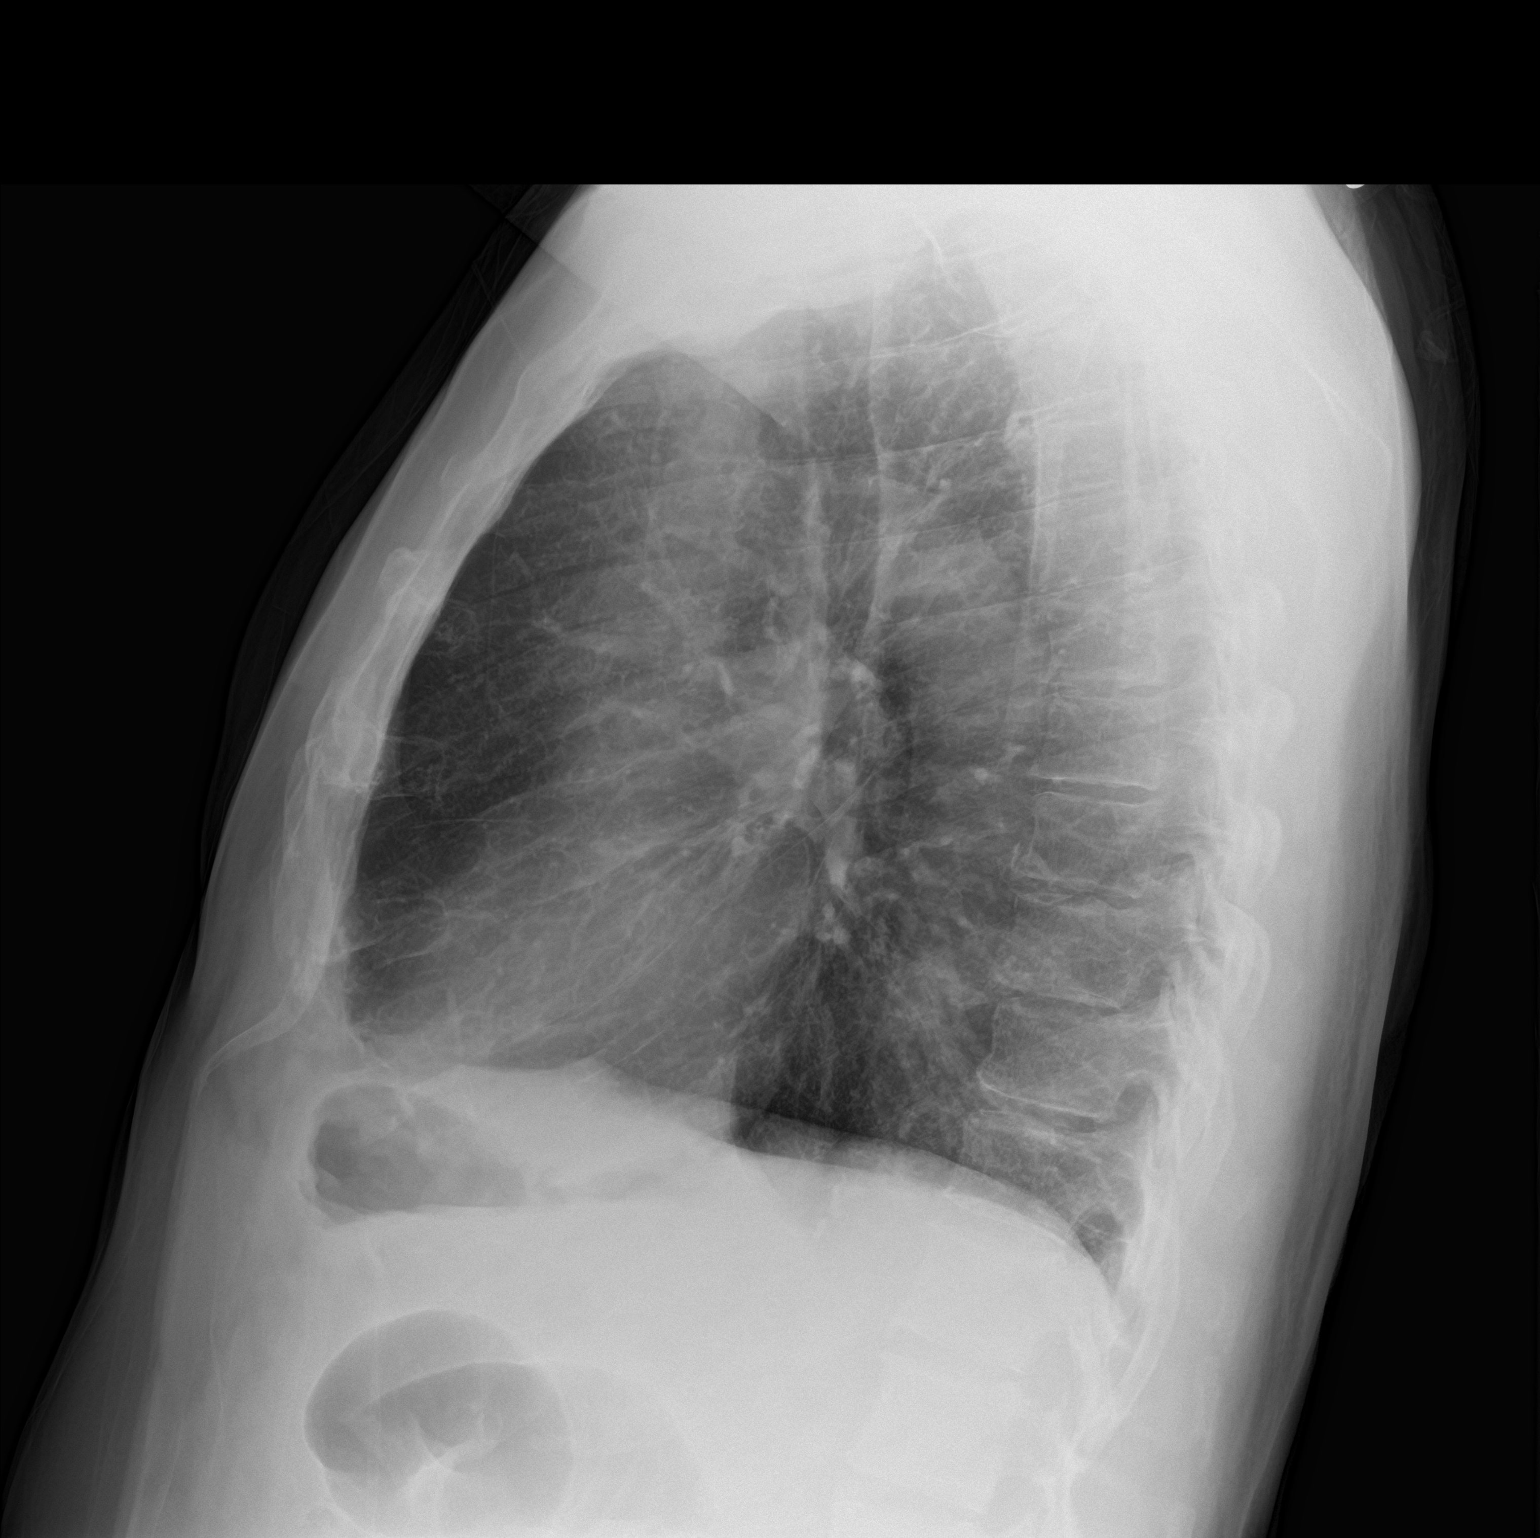

[2 of 2 positions shown; findings below may reference images not displayed]

FINDINGS: The lungs are well-aerated. Mild left basilar opacity likely
reflects atelectasis, though mild pneumonia might have a similar
appearance. There is no evidence of pleural effusion or
pneumothorax.

The heart is normal in size; the mediastinal contour is within
normal limits. No acute osseous abnormalities are seen.
IMPRESSION: Mild left basilar opacity likely reflects atelectasis, though mild
pneumonia might have a similar appearance.

## 2016-04-17 ENCOUNTER — Other Ambulatory Visit: Payer: Self-pay | Admitting: Family Medicine

## 2016-04-17 MED FILL — TIMOLOL 0.5% EYE DROPS: 0.5 | 30 days supply | Qty: 5 | Fill #1

## 2016-04-17 MED FILL — LATANOPROST 0.005% EYE DRP: 0.005 | 30 days supply | Qty: 3 | Fill #2

## 2016-04-17 MED FILL — PROAIR HFA 90 MCG INHALER: 108 (90 BAS | 20 days supply | Qty: 9 | Fill #0

## 2016-04-17 MED FILL — ALPHAGAN P 0.1% DROPS: 0.1 | 89 days supply | Qty: 15 | Fill #0

## 2016-04-17 MED FILL — $Qvar 80mcg Oral Inhaler: 30 days supply | Qty: 1 | Fill #5

## 2016-04-17 MED FILL — DORZOLAMIDE HCL 2% EYE DRP: 2 | 30 days supply | Qty: 10 | Fill #1

## 2016-06-11 ENCOUNTER — Emergency Department (HOSPITAL_COMMUNITY)
Admission: EM | Admit: 2016-06-11 | Discharge: 2016-06-11 | Disposition: A | Discharge status: Home / Self Care | Payer: Self-pay | Attending: Emergency Medicine | Admitting: Emergency Medicine

## 2016-06-11 ENCOUNTER — Emergency Department (HOSPITAL_COMMUNITY): Payer: Self-pay

## 2016-06-11 ENCOUNTER — Encounter (HOSPITAL_COMMUNITY): Payer: Self-pay

## 2016-06-11 DIAGNOSIS — I10 Essential (primary) hypertension: Secondary | ICD-10-CM | POA: Insufficient documentation

## 2016-06-11 DIAGNOSIS — Z79899 Other long term (current) drug therapy: Secondary | ICD-10-CM | POA: Insufficient documentation

## 2016-06-11 DIAGNOSIS — Z87891 Personal history of nicotine dependence: Secondary | ICD-10-CM | POA: Insufficient documentation

## 2016-06-11 DIAGNOSIS — J45901 Unspecified asthma with (acute) exacerbation: Secondary | ICD-10-CM | POA: Insufficient documentation

## 2016-06-11 LAB — BASIC METABOLIC PANEL
Anion gap: 9 (ref 5–15)
BUN: 19 mg/dL (ref 6–20)
CO2: 27 mmol/L (ref 22–32)
Calcium: 9 mg/dL (ref 8.9–10.3)
Chloride: 102 mmol/L (ref 101–111)
Creatinine, Ser: 1.11 mg/dL (ref 0.61–1.24)
GFR calc Af Amer: >60 mL/min (ref >60)
GFR calc non Af Amer: >60 mL/min (ref >60)
Glucose, Bld: 100 mg/dL — ABNORMAL HIGH (ref 65–99)
Potassium: 3.9 mmol/L (ref 3.5–5.1)
Sodium: 138 mmol/L (ref 135–145)

## 2016-06-11 LAB — CBC
HCT: 43.1 % (ref 39.0–52.0)
Hemoglobin: 14.5 g/dL (ref 13.0–17.0)
MCH: 28.8 pg (ref 26.0–34.0)
MCHC: 33.6 g/dL (ref 30.0–36.0)
MCV: 85.7 fL (ref 78.0–100.0)
Platelets: 192 10*3/uL (ref 150–400)
RBC: 5.03 MIL/uL (ref 4.22–5.81)
RDW: 13.5 % (ref 11.5–15.5)
WBC: 8.7 10*3/uL (ref 4.0–10.5)

## 2016-06-11 LAB — I-STAT TROPONIN, ED: Troponin i, poc: 0 ng/mL (ref 0.00–0.08)

## 2016-06-11 LAB — TROPONIN I: Troponin I: <0.03 ng/mL (ref <0.03)

## 2016-06-11 MED ORDER — AZITHROMYCIN 250 MG PO TABS
500.0000 mg | ORAL_TABLET | Freq: Once | ORAL | Status: AC
  Administered 2016-06-11: 500 mg via ORAL
  Filled 2016-06-11: qty 2

## 2016-06-11 MED ORDER — PREDNISONE 20 MG PO TABS
60.0000 mg | ORAL_TABLET | Freq: Once | ORAL | Status: AC
  Administered 2016-06-11: 60 mg via ORAL
  Filled 2016-06-11: qty 3

## 2016-06-11 MED ORDER — IPRATROPIUM-ALBUTEROL 0.5-2.5 (3) MG/3ML IN SOLN
3.0000 mL | RESPIRATORY_TRACT | Status: AC
  Administered 2016-06-11 (×3): 3 mL via RESPIRATORY_TRACT
  Filled 2016-06-11: qty 6

## 2016-06-11 MED ORDER — PREDNISONE 20 MG PO TABS: ORAL_TABLET | ORAL | 0 refills | Status: DC

## 2016-06-11 NOTE — ED Provider Notes (Signed)
[facility] DEPT Provider Note   CSN: [id number] Arrival date & time: 06/11/16  1608     History   Chief Complaint Chief Complaint  Patient presents with  . Chest Pain    HPI Bryan Sullivan is a 71 y.o. male.   Shortness of Breath  This is a recurrent problem. The average episode lasts 3 days. The problem occurs rarely.The problem has not changed since onset.Associated symptoms include rhinorrhea and sputum production. Pertinent negatives include no fever, no headaches, no chest pain and no vomiting. It is unknown what precipitated the problem. He has tried nothing for the symptoms. The treatment provided no relief. He has had no prior hospitalizations. He has had prior ED visits. He has had no prior ICU admissions. Associated medical issues include asthma.    Past Medical History:  Diagnosis Date  . Asthma    As child   . Enlarged prostate Dx 2001  . Hypertension    Dx at age 72    Patient Active Problem List   Diagnosis Date Noted  . Syncope, near 12/16/2014  . Glaucoma (increased eye pressure) 12/16/2014  . Lumbar back pain 12/16/2014  . BPH (benign prostatic hyperplasia) 11/24/2014  . DJD (degenerative joint disease) of knee 11/24/2014  . Chronic obstructive airway disease with asthma ([place]) 10/11/2014  . Hypertension 09/22/2014  . Elevated troponin 09/16/2014    Past Surgical History:  Procedure Laterality Date  . CATARACT EXTRACTION W/ INTRAOCULAR LENS  IMPLANT, BILATERAL Bilateral   . CORNEAL TRANSPLANT Right   . EYE SURGERY         Home Medications    Prior to Admission medications   Medication Sig Start Date End Date Taking? Authorizing Provider  acetaminophen-codeine (TYLENOL #3) 300-30 MG tablet Take 1 tablet by mouth every 8 (eight) hours as needed for moderate pain. 07/14/15  Yes Josalyn Funches, MD  albuterol (PROVENTIL HFA;VENTOLIN HFA) 108 (90 Base) MCG/ACT inhaler Inhale 2 puffs into the lungs every 2 (two) hours as needed for wheezing  or shortness of breath (cough). 12/13/15  Yes Tresa Garter, MD  amLODipine (NORVASC) 5 MG tablet Take 1 tablet (5 mg total) by mouth daily. 07/14/15  Yes Josalyn Funches, MD  beclomethasone (QVAR) 80 MCG/ACT inhaler Inhale 2 puffs into the lungs 2 (two) times daily. 12/13/15  Yes Tresa Garter, MD  brimonidine (ALPHAGAN) 0.2 % ophthalmic solution Place 1 drop into the right eye 3 (three) times daily. 07/14/15  Yes Josalyn Funches, MD  dorzolamide (TRUSOPT) 2 % ophthalmic solution Place 1 drop into the right eye 3 (three) times daily. 08/01/15  Yes Josalyn Funches, MD  ipratropium-albuterol (DUONEB) 0.5-2.5 (3) MG/3ML SOLN Take 3 mLs by nebulization every 6 (six) hours as needed. Patient taking differently: Take 3 mLs by nebulization every 6 (six) hours as needed. For shortness of breath 06/24/15  Yes Oswald Hillock, MD  latanoprost (XALATAN) 0.005 % ophthalmic solution Place 1 drop into the right eye at bedtime. 07/14/15  Yes Josalyn Funches, MD  prednisoLONE acetate (PRED FORTE) 1 % ophthalmic suspension Place 1 drop into the right eye 2 (two) times daily. 07/14/15  Yes Josalyn Funches, MD  PROAIR HFA 108 (90 Base) MCG/ACT inhaler INHALE 2 PUFFS INTO THE LUNGS EVERY 2 HOURS AS NEEDED FOR WHEEZING, SHORTNESS OF BREATH, OR COUGH. 04/17/16  Yes Olugbemiga E Doreene Burke, MD  tamsulosin (FLOMAX) 0.4 MG CAPS capsule Take 1 capsule (0.4 mg total) by mouth daily. 07/14/15  Yes Josalyn Funches, MD  timolol (BETIMOL) 0.5 %  ophthalmic solution Place 1 drop into both eyes 2 (two) times daily. 07/14/15  Yes Boykin Nearing, MD  predniSONE (DELTASONE) 20 MG tablet 2 tabs po daily x 4 days 06/11/16   Merrily Pew, MD    Family History Family History  Problem Relation Age of Onset  . Diabetes Mother   . Asthma Father   . Asthma Sister     Social History Social History  Substance Use Topics  . Smoking status: Former Smoker    Types: Cigarettes    Quit date: 09/21/1968  . Smokeless tobacco: Never Used      Comment: "quit smoking cigarettes in 1970"  . Alcohol use No     Allergies   Patient has no known allergies.   Review of Systems Review of Systems  Constitutional: Negative for fever.  HENT: Positive for rhinorrhea.   Respiratory: Positive for sputum production and shortness of breath.   Cardiovascular: Negative for chest pain.  Gastrointestinal: Negative for vomiting.  Neurological: Negative for headaches.  All other systems reviewed and are negative.    Physical Exam Updated Vital Signs BP 125/83   Pulse 80   Temp 98.4 F (36.9 C) (Oral)   Resp 18   SpO2 99%   Physical Exam  Constitutional: He is oriented to person, place, and time. He appears well-developed and well-nourished.  HENT:  Head: Normocephalic and atraumatic.  Eyes: Conjunctivae and EOM are normal.  Neck: Normal range of motion.  Cardiovascular: Normal rate and regular rhythm.  Exam reveals no friction rub.   No murmur heard. Pulmonary/Chest: Effort normal. No respiratory distress. He has wheezes. He has no rales. He exhibits no tenderness.  Abdominal: Soft. He exhibits no distension.  Musculoskeletal: Normal range of motion. He exhibits no edema, tenderness or deformity.  Neurological: He is alert and oriented to person, place, and time.  Nursing note and vitals reviewed.    ED Treatments / Results  Labs (all labs ordered are listed, but only abnormal results are displayed) Labs Reviewed  BASIC METABOLIC PANEL - Abnormal; Notable for the following:       Result Value   Glucose, Bld 100 (*)    All other components within normal limits  CBC  TROPONIN I  I-STAT TROPOININ, ED    EKG  EKG Interpretation None       Radiology Dg Chest 2 View  Result Date: 06/11/2016 CLINICAL DATA:  Central chest pain EXAM: CHEST  2 VIEW COMPARISON:  06/22/2015 FINDINGS: Cardiomediastinal silhouette is stable. No pulmonary edema. Mild mid thoracic dextroscoliosis again noted. Prominent fat pad left  cardiophrenic angle again noted. No definite infiltrate or pulmonary edema. Hyperinflation again noted. IMPRESSION: No pulmonary edema. Mild mid thoracic dextroscoliosis again noted. Prominent fat pad left cardiophrenic angle again noted. No definite infiltrate or pulmonary edema. Electronically Signed   By: Lahoma Crocker M.D.   On: 06/11/2016 17:29    Procedures Procedures (including critical care time)  Medications Ordered in ED Medications  ipratropium-albuterol (DUONEB) 0.5-2.5 (3) MG/3ML nebulizer solution 3 mL (3 mLs Nebulization Given 06/11/16 1837)  predniSONE (DELTASONE) tablet 60 mg (60 mg Oral Given 06/11/16 1830)  azithromycin (ZITHROMAX) tablet 500 mg (500 mg Oral Given 06/11/16 1830)     Initial Impression / Assessment and Plan / ED Course  I have reviewed the triage vital signs and the nursing notes.  Pertinent labs & imaging results that were available during my care of the patient were reviewed by me and considered in my medical decision making (  see chart for details).  Clinical Course    Low HEART, doubt ACS, will get delta troponin and serial ECG. Likely related to asthma exacerbation so will treat appropriately.   deltat rop ok. Serial ecgs ok. Symptomatically improved. Plan to dc on asthma treatments.   Final Clinical Impressions(s) / ED Diagnoses   Final diagnoses:  Mild asthma with exacerbation, unspecified whether persistent    New Prescriptions Discharge Medication List as of 06/11/2016 10:51 PM    START taking these medications   Details  predniSONE (DELTASONE) 20 MG tablet 2 tabs po daily x 4 days, Print         Merrily Pew, MD 06/12/16 0015

## 2016-06-11 NOTE — ED Notes (Signed)
Pt verbalized understanding discharge instructions and denies any further needs or questions at this time. VS stable, ambulatory and steady gait.   

## 2016-06-11 NOTE — ED Triage Notes (Signed)
Pt complaining of central chest pain. Pt states congested cough x 3-4 days. Pt states diffuse chest pain. Pt denies any fevers or dizziness.

## 2016-06-12 ENCOUNTER — Other Ambulatory Visit: Payer: Self-pay | Admitting: Internal Medicine

## 2016-06-12 MED FILL — $Qvar 80mcg Oral Inhaler: 30 days supply | Qty: 1 | Fill #6

## 2016-06-12 MED FILL — TIMOLOL 0.5% EYE DROPS: 0.5 | 30 days supply | Qty: 5 | Fill #2

## 2016-06-12 MED FILL — predniSONE 20 MG TABS: 20 | 4 days supply | Qty: 8 | Fill #0

## 2016-06-12 MED FILL — PROAIR HFA 90 MCG INHALER: 108 (90 BAS | 20 days supply | Qty: 9 | Fill #0

## 2016-06-12 MED FILL — LATANOPROST 0.005% EYE DRP: 0.005 | 30 days supply | Qty: 3 | Fill #3

## 2016-06-12 MED FILL — ?TAMSULOSIN HCL 0.4 MG CAP: 0.4 | 90 days supply | Qty: 90 | Fill #3

## 2016-06-12 MED FILL — AMLODIPINE BESYLATE 5 MG TA: 5 | 90 days supply | Qty: 90 | Fill #5

## 2016-06-12 MED FILL — DORZOLAMIDE HCL 2% EYE DRP: 2 | 30 days supply | Qty: 10 | Fill #2

## 2016-06-19 ENCOUNTER — Other Ambulatory Visit: Payer: Self-pay

## 2016-06-19 MED ORDER — IPRATROPIUM-ALBUTEROL 0.5-2.5 (3) MG/3ML IN SOLN: 3.0000 mL | Freq: Four times a day (QID) | RESPIRATORY_TRACT | 2 refills | Status: DC | PRN

## 2016-06-20 MED FILL — IPRAT-ALBUT 0.5-3(2.5) MG/3: 0.5-2.5 (3) | 30 days supply | Qty: 360 | Fill #0

## 2016-07-26 ENCOUNTER — Other Ambulatory Visit: Payer: Self-pay

## 2016-07-26 ENCOUNTER — Other Ambulatory Visit: Payer: Self-pay | Admitting: Family Medicine

## 2016-07-26 ENCOUNTER — Ambulatory Visit: Payer: Medicaid Other | Attending: Family Medicine | Admitting: Physician Assistant

## 2016-07-26 VITALS — BP 150/80 | HR 81 | Temp 98.0°F | Resp 16 | Wt 168.2 lb

## 2016-07-26 DIAGNOSIS — N400 Enlarged prostate without lower urinary tract symptoms: Secondary | ICD-10-CM

## 2016-07-26 DIAGNOSIS — R739 Hyperglycemia, unspecified: Secondary | ICD-10-CM

## 2016-07-26 DIAGNOSIS — I10 Essential (primary) hypertension: Secondary | ICD-10-CM

## 2016-07-26 DIAGNOSIS — Z79899 Other long term (current) drug therapy: Secondary | ICD-10-CM | POA: Insufficient documentation

## 2016-07-26 DIAGNOSIS — Z131 Encounter for screening for diabetes mellitus: Secondary | ICD-10-CM

## 2016-07-26 DIAGNOSIS — R42 Dizziness and giddiness: Secondary | ICD-10-CM

## 2016-07-26 DIAGNOSIS — Z87891 Personal history of nicotine dependence: Secondary | ICD-10-CM | POA: Insufficient documentation

## 2016-07-26 LAB — POCT GLYCOSYLATED HEMOGLOBIN (HGB A1C): Hemoglobin A1C: 5.9

## 2016-07-26 MED ORDER — FLUTICASONE PROPIONATE 50 MCG/ACT NA SUSP: 2.0000 | Freq: Every day | NASAL | 2 refills | Status: DC

## 2016-07-26 MED ORDER — MECLIZINE HCL 25 MG PO TABS: 25.0000 mg | ORAL_TABLET | Freq: Three times a day (TID) | ORAL | 0 refills | Status: DC | PRN

## 2016-07-26 MED FILL — LATANOPROST 0.005% EYE DRP: 0.005 | 30 days supply | Qty: 3 | Fill #4

## 2016-07-26 MED FILL — FLUTICASONE PROP 50 MCG SPR: 50 | 30 days supply | Qty: 16 | Fill #0

## 2016-07-26 MED FILL — TRAVEL SICKNESS 25 MG TAB C: 25 | 10 days supply | Qty: 30 | Fill #0

## 2016-07-26 MED FILL — TIMOLOL 0.5% EYE DROPS: 0.5 | 30 days supply | Qty: 5 | Fill #3

## 2016-07-26 MED FILL — IPRAT-ALBUT 0.5-3(2.5) MG/3: 0.5-2.5 (3) | 30 days supply | Qty: 360 | Fill #1

## 2016-07-26 MED FILL — PROAIR HFA 90 MCG INHALER: 108 (90 BAS | 20 days supply | Qty: 9 | Fill #1

## 2016-07-26 NOTE — Progress Notes (Signed)
Bryan Sullivan, is a 71 y.o. male  [id number]  [id number]  DOB - Jul 03, 1945  Subjective:  Chief Complaint and HPI: Bryan Sullivan is a 71 y.o. male here today for 4 day h/o intermittent dizziness worse with changing of positions.  He did have a cold and congestion about 1 week ago.  He denies any CP.  No jaw pain.  No radiating pain/arm pain.  No SOB.  No weakness.  Appetite is good.  He had something similar about 2 years.  No ear pain.  No h/o MI/cardiac events.  No precipitating/alleviating factors.  No N/V/D.    Compliant with medications.  Social History:  Former smoker Family history:  Dada asthma-deceased, mom-DM deceased.  No early cardiac events.  ROS:   Constitutional:  No f/c, No night sweats, No unexplained weight loss. EENT:  No vision changes, No blurry vision, No hearing changes. No mouth, throat, or ear problems.  Respiratory: No cough, No SOB Cardiac: No CP, no palpitations GI:  No abd pain, No N/V/D. GU: No Urinary s/sx Musculoskeletal: No joint pain Neuro: No headache, + dizziness, no motor weakness.  Skin: No rash Endocrine:  No polydipsia. No polyuria.  Psych: Denies SI/HI  No problems updated.  ALLERGIES: No Known Allergies  PAST MEDICAL HISTORY: Past Medical History:  Diagnosis Date  . Asthma    As child   . Enlarged prostate Dx 2001  . Hypertension    Dx at age 66    [place]: Prior to Admission medications   Medication Sig Start Date End Date Taking? Authorizing Provider  amLODipine (NORVASC) 5 MG tablet Take 1 tablet (5 mg total) by mouth daily. 07/14/15  Yes Josalyn Funches, MD  tamsulosin (FLOMAX) 0.4 MG CAPS capsule Take 1 capsule (0.4 mg total) by mouth daily. 07/14/15  Yes Josalyn Funches, MD  acetaminophen-codeine (TYLENOL #3) 300-30 MG tablet Take 1 tablet by mouth every 8 (eight) hours as needed for moderate pain. Patient not taking: Reported on 07/26/2016 07/14/15   Boykin Nearing, MD  beclomethasone  (QVAR) 80 MCG/ACT inhaler Inhale 2 puffs into the lungs 2 (two) times daily. Patient not taking: Reported on 07/26/2016 12/13/15   Tresa Garter, MD  brimonidine (ALPHAGAN) 0.2 % ophthalmic solution Place 1 drop into the right eye 3 (three) times daily. Patient not taking: Reported on 07/26/2016 07/14/15   Boykin Nearing, MD  dorzolamide (TRUSOPT) 2 % ophthalmic solution Place 1 drop into the right eye 3 (three) times daily. Patient not taking: Reported on 07/26/2016 08/01/15   Boykin Nearing, MD  fluticasone (FLONASE) 50 MCG/ACT nasal spray Place 2 sprays into both nostrils daily. 07/26/16   Argentina Donovan, PA-C  ipratropium-albuterol (DUONEB) 0.5-2.5 (3) MG/3ML SOLN Take 3 mLs by nebulization every 6 (six) hours as needed. Patient not taking: Reported on 07/26/2016 06/19/16   Boykin Nearing, MD  latanoprost (XALATAN) 0.005 % ophthalmic solution Place 1 drop into the right eye at bedtime. Patient not taking: Reported on 07/26/2016 07/14/15   Boykin Nearing, MD  meclizine (ANTIVERT) 25 MG tablet Take 1 tablet (25 mg total) by mouth 3 (three) times daily as needed for dizziness. 07/26/16   Argentina Donovan, PA-C  prednisoLONE acetate (PRED FORTE) 1 % ophthalmic suspension Place 1 drop into the right eye 2 (two) times daily. Patient not taking: Reported on 07/26/2016 07/14/15   Boykin Nearing, MD  predniSONE (DELTASONE) 20 MG tablet 2 tabs po daily x 4 days Patient not taking: Reported on 07/26/2016 06/11/16  Merrily Pew, MD  PROAIR HFA 108 [phone] Base) MCG/ACT inhaler INHALE 2 PUFFS INTO THE LUNGS EVERY 2 HOURS AS NEEDED FOR WHEEZING, SHORTNESS OF BREATH, OR COUGH. Patient not taking: Reported on 07/26/2016 06/12/16   Boykin Nearing, MD  timolol (BETIMOL) 0.5 % ophthalmic solution Place 1 drop into both eyes 2 (two) times daily. Patient not taking: Reported on 07/26/2016 07/14/15   Boykin Nearing, MD     Objective:  EXAM:   Vitals:   07/26/16 1432  BP: (!) 150/80  Pulse: 81  Resp: 16  Temp: 98 F (36.7  C)  TempSrc: Oral  SpO2: 97%  Weight: 168 lb 3.2 oz (76.3 kg)    General appearance : A&OX3. NAD. Non-toxic-appearing HEENT: Atraumatic and Normocephalic. TM dull with fluid B but no infection. Mouth-MMM, post pharynx WNL w/o erythema, No PND. Neck: supple, no JVD. No cervical lymphadenopathy. No thyromegaly Chest/Lungs:  Breathing-non-labored, Good air entry bilaterally, breath sounds normal without rales, rhonchi, or wheezing  CVS: S1 S2 regular, no murmurs, gallops, rubs  Extremities: Bilateral Lower Ext shows no edema, both legs are warm to touch with = pulse throughout Neurology:  CN II-XII grossly intact, Non focal.   Psych:  TP linear. J/I WNL. Normal speech. Appropriate eye contact and affect.  Skin:  No Rash  Data Review Lab Results  Component Value Date   HGBA1C 5.9 07/26/2016   HGBA1C 6.1 (H) 06/22/2015     Assessment & Plan   1. Dizziness EKG today without acute changes.  Neurological exam is normal. - meclizine (ANTIVERT) 25 MG tablet; Take 1 tablet (25 mg total) by mouth 3 (three) times daily as needed for dizziness.  Dispense: 30 tablet; Refill: 0 - fluticasone (FLONASE) 50 MCG/ACT nasal spray; Place 2 sprays into both nostrils daily.  Dispense: 16 g; Refill: 2  2. Hyperglycemia - POCT glycosylated hemoglobin (Hb A1C) I have had a lengthy discussion and provided education about insulin resistance and the intake of too much sugar/refined carbohydrates.  I have advised the patient to work at a goal of eliminating sugary drinks, candy, desserts, sweets, refined sugars, processed foods, and white carbohydrates.  The patient expresses understanding.    3. Essential hypertension Patient have been counseled extensively about nutrition and exercise  Return in about 6 weeks (around 09/06/2016) for Dr Adrian Blackwater for Bp check/labs/check up/hyperglycemia.  The patient was given clear instructions to go to ER or return to medical center if symptoms don't improve, worsen or  new problems develop. The patient verbalized understanding. The patient was told to call to get lab results if they haven't heard anything in the next week.     Freeman Caldron, PA-C [facility] [place], [place]   07/26/2016, 3:03 PM

## 2016-07-26 NOTE — Patient Instructions (Signed)
Ataxia Ataxia is a condition that results in unsteadiness when walking and standing, poor coordination of body movements, and difficulty maintaining an upright posture. It occurs due to a problem with the part of your brain that controls coordination and stability (cerebellar dysfunction). What are the causes? Ataxia can develop later in life (acquired ataxia) during your 20s to 30s, and even as late as into your 60s or beyond. Acquired ataxia may be caused by:  Changes in your nervous system (neurodegenerative).  Changes throughout your body (systemic disorders).  Excess exposure to:  Medicines, such as phenytoin and lithium.  Solvents.  Abuse of alcohol (alcoholism).  Medical conditions, such as:  Celiac sprue.  Hypothyroidism.  Vitamin E deficiency.  Structural brain abnormalities, such as tumors.  Multiple sclerosis.  Stroke.  Head injury. Ataxia may also be present early in life (non-acquired ataxia). There are two main types of non-acquired ataxia:  Cerebellar dysfunction present at birth (congenital).  Family inheritance (genetic heredity). Friedreich ataxia is the most common form of hereditary ataxia. What are the signs or symptoms? The signs and symptoms of ataxia can vary depending on how severe the condition is that causes it. Signs and symptoms may include:  Unsteadiness.  Walking with a wide stance.  Tremor.  Poorly coordinated body movements.  Difficulty maintaining a straight (upright) posture.  Fatigue.  Changes in your speech.  Changes in your vision.  Difficulty swallowing.  Difficulty with writing.  Decreased mental status (dementia).  Muscle spasms. How is this diagnosed? Ataxia is diagnosed by discussing your personal and family history and through a physical exam. You may also have additional tests such as:  MRI.  Genetic testing. How is this treated? Treatment for ataxia may include treating or removing the underlying  condition causing the ataxia. Surgery may be required if a structural abnormality in your brain is causing the ataxia. Otherwise, supportive treatments may be used to manage your symptoms. Follow these instructions at home: Monitor your ataxia for any changes. The following actions may help any discomfort you are experiencing:  Do not drink alcohol.  Lie down right away if you become very unsteady, dizzy, nauseated, or feel like you are going to faint. Wait until all of these feelings pass before you get up again. Get help right away if:  Your unsteadiness suddenly worsens.  You develop severe headaches, chest pain, or abdominal pain.  You have weakness or numbness on one side of your body.  You have problems with your vision.  You feel confused.  You have difficulty speaking.  You have an irregular heartbeat or a very fast pulse. This information is not intended to replace advice given to you by your health care provider. Make sure you discuss any questions you have with your health care provider. Document Released: 12/09/2013 Document Revised: 10/20/2015 Document Reviewed: 08/14/2013 Elsevier Interactive Patient Education  2017 [employer]  

## 2016-07-27 ENCOUNTER — Other Ambulatory Visit: Payer: Self-pay | Admitting: Family Medicine

## 2016-07-27 DIAGNOSIS — J449 Chronic obstructive pulmonary disease, unspecified: Secondary | ICD-10-CM

## 2016-07-27 MED FILL — $Qvar 80mcg Oral Inhaler: 30 days supply | Qty: 1 | Fill #0

## 2016-09-03 ENCOUNTER — Other Ambulatory Visit: Payer: Self-pay | Admitting: Physician Assistant

## 2016-09-03 DIAGNOSIS — R42 Dizziness and giddiness: Secondary | ICD-10-CM

## 2016-09-03 MED FILL — LATANOPROST 0.005% EYE DRP: 0.005 | 30 days supply | Qty: 3 | Fill #5

## 2016-09-03 MED FILL — ALPHAGAN P 0.1% DROPS: 0.1 | 89 days supply | Qty: 15 | Fill #1

## 2016-09-03 MED FILL — TAMSULOSIN HCL 0.4 MG CAP: 0.4 | 90 days supply | Qty: 90 | Fill #0

## 2016-09-03 MED FILL — PROAIR HFA 90 MCG INHALER: 108 (90 BAS | 8 days supply | Qty: 9 | Fill #2

## 2016-09-03 MED FILL — FLUOROMETHOLONE 0.1% DROPS: 0.1 | 29 days supply | Qty: 5 | Fill #1

## 2016-09-03 MED FILL — TIMOLOL 0.5% EYE DROPS: 0.5 | 30 days supply | Qty: 5 | Fill #4

## 2016-09-03 MED FILL — $Qvar 80mcg Oral Inhaler: 30 days supply | Qty: 1 | Fill #1

## 2016-09-03 MED FILL — FLUTICASONE PROP 50 MCG SPR: 50 | 30 days supply | Qty: 16 | Fill #1

## 2016-09-03 MED FILL — ?AMLODIPINE BESYLATE 5 MG T: 5 | 90 days supply | Qty: 90 | Fill #0

## 2016-09-06 ENCOUNTER — Ambulatory Visit: Payer: Self-pay | Attending: Family Medicine | Admitting: Family Medicine

## 2016-09-06 ENCOUNTER — Encounter: Payer: Self-pay | Admitting: Family Medicine

## 2016-09-06 VITALS — BP 147/71 | HR 88 | Temp 97.3°F | Ht 70.0 in | Wt 188.8 lb

## 2016-09-06 DIAGNOSIS — I10 Essential (primary) hypertension: Secondary | ICD-10-CM

## 2016-09-06 DIAGNOSIS — H409 Unspecified glaucoma: Secondary | ICD-10-CM

## 2016-09-06 DIAGNOSIS — Z87891 Personal history of nicotine dependence: Secondary | ICD-10-CM | POA: Insufficient documentation

## 2016-09-06 DIAGNOSIS — Z131 Encounter for screening for diabetes mellitus: Secondary | ICD-10-CM

## 2016-09-06 DIAGNOSIS — N401 Enlarged prostate with lower urinary tract symptoms: Secondary | ICD-10-CM

## 2016-09-06 DIAGNOSIS — R351 Nocturia: Secondary | ICD-10-CM | POA: Insufficient documentation

## 2016-09-06 DIAGNOSIS — Z79899 Other long term (current) drug therapy: Secondary | ICD-10-CM | POA: Insufficient documentation

## 2016-09-06 MED ORDER — TAMSULOSIN HCL 0.4 MG PO CAPS: 0.4000 mg | ORAL_CAPSULE | Freq: Every day | ORAL | 11 refills | Status: DC

## 2016-09-06 MED ORDER — AMLODIPINE BESYLATE 5 MG PO TABS: 5.0000 mg | ORAL_TABLET | Freq: Every day | ORAL | 3 refills | Status: DC

## 2016-09-06 MED ORDER — DORZOLAMIDE HCL 2 % OP SOLN: 1.0000 [drp] | Freq: Three times a day (TID) | OPHTHALMIC | 3 refills | Status: DC

## 2016-09-06 MED ORDER — LOSARTAN POTASSIUM 25 MG PO TABS: 25.0000 mg | ORAL_TABLET | Freq: Every day | ORAL | 2 refills | Status: DC

## 2016-09-06 MED FILL — LOSARTAN POTASSIUM 25 MG TA: 25 | 30 days supply | Qty: 30 | Fill #0

## 2016-09-06 NOTE — Assessment & Plan Note (Signed)
A: elevated BP Med: reports compliance with Norvasc 5 mg daily P: Add losartan 25 mg daily Decided not to increase Norvasc du to risk of dizziness in elderly

## 2016-09-06 NOTE — Assessment & Plan Note (Signed)
Elevated A1c Advised increase exercise Walking for 30 minutes 3-4 times per week

## 2016-09-06 NOTE — Patient Instructions (Signed)
Bryan Sullivan was seen today for hypertension.  Diagnoses and all orders for this visit:  Essential hypertension -     losartan (COZAAR) 25 MG tablet; Take 1 tablet (25 mg total) by mouth daily. -     amLODipine (NORVASC) 5 MG tablet; Take 1 tablet (5 mg total) by mouth daily.  Glaucoma of both eyes, unspecified glaucoma type -     dorzolamide (TRUSOPT) 2 % ophthalmic solution; Place 1 drop into the right eye 3 (three) times daily.  Benign prostatic hyperplasia with nocturia -     tamsulosin (FLOMAX) 0.4 MG CAPS capsule; Take 1 capsule (0.4 mg total) by mouth daily.   f/u in 4 weeks for HTN  Dr. Hulda Humphrey

## 2016-09-06 NOTE — Progress Notes (Signed)
Subjective:  Patient ID: Bryan Sullivan, male    DOB: 27-Aug-1945  Age: 71 y.o. MRN: [id number]  CC: Hypertension   HPI Seydou Bielinski has HTN, COPD, glaucoma he  presents for   1. CHRONIC HYPERTENSION  Disease Monitoring  Blood pressure range: not checking   Chest pain: no   Dyspnea: no   Claudication: no   Medication compliance: yes  Medication Side Effects  Lightheadedness: no   Urinary frequency: wakes up 1 time at night to urinate   Edema: trace in legs    Preventitive Healthcare:  Exercise: no   Diet Pattern: eating low salt and low sugar   Salt Restriction: yes  2. BPH: nocturia has improved from 3 times per night to once per night with flomax. No dysuria.   3. Glaucoma: followed by opthalmology. Has been unable to obtain dorzolamide eye drops at onsite pharmacy. Denies eye pain and headache.   Social History  Substance Use Topics  . Smoking status: Former Smoker    Types: Cigarettes    Quit date: 09/21/1968  . Smokeless tobacco: Never Used     Comment: "quit smoking cigarettes in 1970"  . Alcohol use No    Outpatient Medications Prior to Visit  Medication Sig Dispense Refill  . beclomethasone (QVAR) 80 MCG/ACT inhaler INHALE 2 PUFFS INTO THE LUNGS 2 TIMES DAILY. 3 Inhaler 0  . fluticasone (FLONASE) 50 MCG/ACT nasal spray Place 2 sprays into both nostrils daily. 16 g 2  . meclizine (ANTIVERT) 25 MG tablet Take 1 tablet (25 mg total) by mouth 3 (three) times daily as needed for dizziness. 30 tablet 0  . predniSONE (DELTASONE) 20 MG tablet 2 tabs po daily x 4 days 8 tablet 0  . PROAIR HFA 108 (90 Base) MCG/ACT inhaler INHALE 2 PUFFS INTO THE LUNGS EVERY 2 HOURS AS NEEDED FOR WHEEZING, SHORTNESS OF BREATH, OR COUGH. 8.5 g 2  . tamsulosin (FLOMAX) 0.4 MG CAPS capsule TAKE 1 CAPSULE BY MOUTH DAILY. 90 capsule 0  . acetaminophen-codeine (TYLENOL #3) 300-30 MG tablet Take 1 tablet by mouth every 8 (eight) hours as needed for moderate pain. (Patient not  taking: Reported on 07/26/2016) 30 tablet 0  . amLODipine (NORVASC) 5 MG tablet TAKE 1 TABLET BY MOUTH DAILY. (Patient not taking: Reported on 09/06/2016) 90 tablet 0  . brimonidine (ALPHAGAN) 0.2 % ophthalmic solution Place 1 drop into the right eye 3 (three) times daily. (Patient not taking: Reported on 07/26/2016) 30 mL 3  . dorzolamide (TRUSOPT) 2 % ophthalmic solution Place 1 drop into the right eye 3 (three) times daily. (Patient not taking: Reported on 07/26/2016) 30 mL 3  . ipratropium-albuterol (DUONEB) 0.5-2.5 (3) MG/3ML SOLN Take 3 mLs by nebulization every 6 (six) hours as needed. (Patient not taking: Reported on 07/26/2016) 360 mL 2  . latanoprost (XALATAN) 0.005 % ophthalmic solution Place 1 drop into the right eye at bedtime. (Patient not taking: Reported on 07/26/2016) 7.5 mL 3  . prednisoLONE acetate (PRED FORTE) 1 % ophthalmic suspension Place 1 drop into the right eye 2 (two) times daily. (Patient not taking: Reported on 07/26/2016) 5 mL 3  . timolol (BETIMOL) 0.5 % ophthalmic solution Place 1 drop into both eyes 2 (two) times daily. (Patient not taking: Reported on 07/26/2016) 15 mL 3   No facility-administered medications prior to visit.     ROS Review of Systems  Constitutional: Negative for chills, fatigue, fever and unexpected weight change.  Eyes: Negative for visual disturbance.  Respiratory: Negative  for cough and shortness of breath.   Cardiovascular: Negative for chest pain, palpitations and leg swelling.  Gastrointestinal: Negative for abdominal pain, blood in stool, constipation, diarrhea, nausea and vomiting.  Endocrine: Negative for polydipsia, polyphagia and polyuria.  Musculoskeletal: Negative for arthralgias, back pain, gait problem, myalgias and neck pain.  Skin: Negative for rash.  Allergic/Immunologic: Negative for immunocompromised state.  Hematological: Negative for adenopathy. Does not bruise/bleed easily.  Psychiatric/Behavioral: Positive for sleep disturbance  (trouble sleeping during the day, up at night ). Negative for dysphoric mood and suicidal ideas. The patient is not nervous/anxious.     Objective:  BP (!) 147/71   Pulse 88   Temp 97.3 F (36.3 C) (Oral)   Ht 5\' 10"  (1.778 m)   Wt 188 lb 12.8 oz (85.6 kg)   SpO2 98%   BMI 27.09 kg/m   BP/Weight 09/06/2016 07/26/2016 9/97/7414  Systolic BP 239 532 023  Diastolic BP 71 80 83  Wt. (Lbs) 188.8 168.2 -  BMI 27.09 24.13 -     Physical Exam  Constitutional: He appears well-developed and well-nourished. No distress.  HENT:  Head: Normocephalic and atraumatic.  Neck: Normal range of motion. Neck supple.  Cardiovascular: Normal rate, regular rhythm, normal heart sounds and intact distal pulses.   Pulmonary/Chest: Effort normal and breath sounds normal.  Musculoskeletal: He exhibits no edema.  Neurological: He is alert.  Skin: Skin is warm and dry. No rash noted. No erythema.  Psychiatric: He has a normal mood and affect.    Lab Results  Component Value Date   HGBA1C 5.9 07/26/2016    Assessment & Plan:  Telesforo was seen today for hypertension.  Diagnoses and all orders for this visit:  Essential hypertension -     losartan (COZAAR) 25 MG tablet; Take 1 tablet (25 mg total) by mouth daily. -     amLODipine (NORVASC) 5 MG tablet; Take 1 tablet (5 mg total) by mouth daily.  Glaucoma of both eyes, unspecified glaucoma type -     dorzolamide (TRUSOPT) 2 % ophthalmic solution; Place 1 drop into the right eye 3 (three) times daily.  Benign prostatic hyperplasia with nocturia -     tamsulosin (FLOMAX) 0.4 MG CAPS capsule; Take 1 capsule (0.4 mg total) by mouth daily.   There are no diagnoses linked to this encounter.  No orders of the defined types were placed in this encounter.   Follow-up: Return in about 4 weeks (around 10/04/2016) for HTN .   Boykin Nearing MD

## 2016-09-11 ENCOUNTER — Other Ambulatory Visit: Payer: Self-pay

## 2016-09-11 DIAGNOSIS — H409 Unspecified glaucoma: Secondary | ICD-10-CM

## 2016-09-11 MED ORDER — PREDNISOLONE ACETATE 1 % OP SUSP: 1.0000 [drp] | Freq: Two times a day (BID) | OPHTHALMIC | 3 refills | Status: DC

## 2016-09-11 MED ORDER — DORZOLAMIDE HCL 2 % OP SOLN: 1.0000 [drp] | Freq: Three times a day (TID) | OPHTHALMIC | 3 refills | Status: DC

## 2016-09-11 MED ORDER — ALBUTEROL SULFATE HFA 108 (90 BASE) MCG/ACT IN AERS: INHALATION_SPRAY | RESPIRATORY_TRACT | 3 refills | Status: DC

## 2016-09-28 ENCOUNTER — Encounter (HOSPITAL_COMMUNITY): Payer: Self-pay | Admitting: Emergency Medicine

## 2016-09-28 ENCOUNTER — Emergency Department (HOSPITAL_COMMUNITY): Payer: Self-pay

## 2016-09-28 ENCOUNTER — Emergency Department (HOSPITAL_COMMUNITY)
Admission: EM | Admit: 2016-09-28 | Discharge: 2016-09-29 | Disposition: A | Discharge status: Home / Self Care | Payer: Self-pay | Attending: Emergency Medicine | Admitting: Emergency Medicine

## 2016-09-28 DIAGNOSIS — I10 Essential (primary) hypertension: Secondary | ICD-10-CM | POA: Insufficient documentation

## 2016-09-28 DIAGNOSIS — J4541 Moderate persistent asthma with (acute) exacerbation: Secondary | ICD-10-CM | POA: Insufficient documentation

## 2016-09-28 DIAGNOSIS — Z79899 Other long term (current) drug therapy: Secondary | ICD-10-CM | POA: Insufficient documentation

## 2016-09-28 DIAGNOSIS — Z87891 Personal history of nicotine dependence: Secondary | ICD-10-CM | POA: Insufficient documentation

## 2016-09-28 NOTE — ED Triage Notes (Signed)
Patient c/o asthma, cough and URI. Pt believes he may have pneumonia. States used his inhaler and breathing treatment at home. Pt speaking in full sentences.

## 2016-09-29 MED ORDER — PREDNISONE 20 MG PO TABS
60.0000 mg | ORAL_TABLET | Freq: Once | ORAL | Status: AC
  Administered 2016-09-29: 60 mg via ORAL
  Filled 2016-09-29: qty 3

## 2016-09-29 MED ORDER — PREDNISONE 50 MG PO TABS: ORAL_TABLET | ORAL | 0 refills | Status: DC

## 2016-09-29 MED ORDER — ALBUTEROL SULFATE (2.5 MG/3ML) 0.083% IN NEBU
5.0000 mg | INHALATION_SOLUTION | Freq: Once | RESPIRATORY_TRACT | Status: AC
  Administered 2016-09-29: 5 mg via RESPIRATORY_TRACT
  Filled 2016-09-29: qty 6

## 2016-09-29 MED ORDER — IPRATROPIUM BROMIDE 0.02 % IN SOLN
0.5000 mg | Freq: Once | RESPIRATORY_TRACT | Status: AC
  Administered 2016-09-29: 0.5 mg via RESPIRATORY_TRACT
  Filled 2016-09-29: qty 2.5

## 2016-09-29 NOTE — ED Provider Notes (Signed)
[place] DEPT Provider Note   CSN: [id number] Arrival date & time: 09/28/16  2026   By signing my name below, I, Soijett Blue, attest that this documentation has been prepared under the direction and in the presence of Ripley Fraise, MD. Electronically Signed: Soijett Blue, ED Scribe. 09/29/16. 12:33 AM.  History   Chief Complaint Chief Complaint  Patient presents with  . Cough    HPI Bryan Sullivan is a 71 y.o. male with a PMHx of asthma, HTN, who presents to the Emergency Department complaining of non-productive cough onset 2 days ago. Pt has tried albuterol inhaler and neb treatment with no relief of his symptoms. He denies fever, vomiting, hemoptysis, SOB, abdominal pain, leg swelling, and any other symptoms. Denies smoking cigarettes or PMHx of blood clots.   The history is provided by the patient and a relative. No language interpreter was used.  Cough  This is a recurrent problem. The current episode started 2 days ago. The problem occurs every few hours. The problem has not changed since onset.The cough is non-productive. There has been no fever. Pertinent negatives include no chest pain and no shortness of breath. Treatments tried: albuterol inhaler and neb treatment. The treatment provided no relief. He is not a smoker. His past medical history is significant for asthma.    Past Medical History:  Diagnosis Date  . Asthma    As child   . Enlarged prostate Dx 2001  . Hypertension    Dx at age 9    Patient Active Problem List   Diagnosis Date Noted  . Diabetes mellitus screening 07/26/2016  . Syncope, near 12/16/2014  . Glaucoma (increased eye pressure) 12/16/2014  . Lumbar back pain 12/16/2014  . BPH (benign prostatic hyperplasia) 11/24/2014  . DJD (degenerative joint disease) of knee 11/24/2014  . Chronic obstructive airway disease with asthma ([place]) 10/11/2014  . Hypertension 09/22/2014  . Elevated troponin 09/16/2014    Past Surgical History:    Procedure Laterality Date  . CATARACT EXTRACTION W/ INTRAOCULAR LENS  IMPLANT, BILATERAL Bilateral   . CORNEAL TRANSPLANT Right   . EYE SURGERY         Home Medications    Prior to Admission medications   Medication Sig Start Date End Date Taking? Authorizing Provider  albuterol (PROAIR HFA) 108 (90 Base) MCG/ACT inhaler INHALE 2 PUFFS INTO THE LUNGS EVERY 2 HOURS AS NEEDED FOR WHEEZING, SHORTNESS OF BREATH, OR COUGH. 09/11/16   Funches, Josalyn, MD  amLODipine (NORVASC) 5 MG tablet Take 1 tablet (5 mg total) by mouth daily. 09/06/16   Funches, Adriana Mccallum, MD  beclomethasone (QVAR) 80 MCG/ACT inhaler INHALE 2 PUFFS INTO THE LUNGS 2 TIMES DAILY. 07/27/16   Funches, Adriana Mccallum, MD  brimonidine (ALPHAGAN) 0.2 % ophthalmic solution Place 1 drop into the right eye 3 (three) times daily. Patient not taking: Reported on 07/26/2016 07/14/15   Boykin Nearing, MD  dorzolamide (TRUSOPT) 2 % ophthalmic solution Place 1 drop into the right eye 3 (three) times daily. 09/11/16   Funches, Adriana Mccallum, MD  fluticasone (FLONASE) 50 MCG/ACT nasal spray Place 2 sprays into both nostrils daily. 07/26/16   Argentina Donovan, PA-C  ipratropium-albuterol (DUONEB) 0.5-2.5 (3) MG/3ML SOLN Take 3 mLs by nebulization every 6 (six) hours as needed. Patient not taking: Reported on 07/26/2016 06/19/16   Boykin Nearing, MD  latanoprost (XALATAN) 0.005 % ophthalmic solution Place 1 drop into the right eye at bedtime. Patient not taking: Reported on 07/26/2016 07/14/15   Boykin Nearing, MD  losartan (  COZAAR) 25 MG tablet Take 1 tablet (25 mg total) by mouth daily. 09/06/16   Funches, Adriana Mccallum, MD  meclizine (ANTIVERT) 25 MG tablet Take 1 tablet (25 mg total) by mouth 3 (three) times daily as needed for dizziness. 07/26/16   Argentina Donovan, PA-C  prednisoLONE acetate (PRED FORTE) 1 % ophthalmic suspension Place 1 drop into the right eye 2 (two) times daily. 09/11/16   Funches, Adriana Mccallum, MD  predniSONE (DELTASONE) 20 MG tablet 2 tabs po daily x  4 days 06/11/16   Mesner, Corene Cornea, MD  tamsulosin (FLOMAX) 0.4 MG CAPS capsule Take 1 capsule (0.4 mg total) by mouth daily. 09/06/16   Funches, Adriana Mccallum, MD  timolol (BETIMOL) 0.5 % ophthalmic solution Place 1 drop into both eyes 2 (two) times daily. Patient not taking: Reported on 07/26/2016 07/14/15   Boykin Nearing, MD    Family History Family History  Problem Relation Age of Onset  . Diabetes Mother   . Asthma Father   . Asthma Sister     Social History Social History  Substance Use Topics  . Smoking status: Former Smoker    Types: Cigarettes    Quit date: 09/21/1968  . Smokeless tobacco: Never Used     Comment: "quit smoking cigarettes in 1970"  . Alcohol use No     Allergies   Patient has no known allergies.   Review of Systems Review of Systems  Constitutional: Negative for fever.  Respiratory: Positive for cough. Negative for shortness of breath.   Cardiovascular: Negative for chest pain.  Gastrointestinal: Negative for abdominal pain and vomiting.  All other systems reviewed and are negative.    Physical Exam Updated Vital Signs BP (!) 150/89   Pulse 80   Temp 98.3 F (36.8 C) (Oral)   Resp 20   SpO2 100%   Physical Exam CONSTITUTIONAL: Well developed/well nourished HEAD: Normocephalic/atraumatic ENMT: Mucous membranes moist. Uvula midline. No exudate. No stridor. NECK: supple no meningeal signs SPINE/BACK:entire spine nontender CV: S1/S2 noted, no murmurs/rubs/gallops noted LUNGS: scattered wheezing bilaterally. No distress noted.  ABDOMEN: soft, nontender, no rebound or guarding, bowel sounds noted throughout abdomen GU:no cva tenderness NEURO: Pt is awake/alert/appropriate, moves all extremitiesx4.  No facial droop.   EXTREMITIES: pulses normal/equal, full ROM. No LE edema noted.  SKIN: warm, color normal PSYCH: no abnormalities of mood noted, alert and oriented to situation   ED Treatments / Results  DIAGNOSTIC STUDIES: Oxygen Saturation is  100% on RA, nl by my interpretation.    COORDINATION OF CARE: 12:33 AM Discussed treatment plan with pt at bedside which includes CXR, breathing treatment, and pt agreed to plan.   Labs (all labs ordered are listed, but only abnormal results are displayed) Labs Reviewed - No data to display  EKG  EKG Interpretation  Date/Time:  Saturday Sep 29 2016 00:46:30 EDT Ventricular Rate:  68 PR Interval:    QRS Duration: 102 QT Interval:  404 QTC Calculation: 430 R Axis:   -49 Text Interpretation:  Sinus or ectopic atrial rhythm Left anterior fascicular block Abnormal R-wave progression, early transition No significant change since last tracing Confirmed by Ripley Fraise [phone]) on 09/29/2016 12:51:31 AM       Radiology Dg Chest 2 View  Result Date: 09/28/2016 CLINICAL DATA:  Cough and wheezing. EXAM: CHEST  2 VIEW COMPARISON:  06/11/2016 FINDINGS: Moderate hyperinflation, unchanged. No airspace consolidation. No effusion. Normal pulmonary vasculature. Normal hilar, mediastinal and cardiac contours. IMPRESSION: Hyperinflation.  No consolidation or effusion.  Normal vasculature. Electronically  Signed   By: Andreas Newport M.D.   On: 09/28/2016 22:05    Procedures Procedures   Medications Ordered in ED Medications  albuterol (PROVENTIL) (2.5 MG/3ML) 0.083% nebulizer solution 5 mg (5 mg Nebulization Given 09/29/16 0037)  ipratropium (ATROVENT) nebulizer solution 0.5 mg (0.5 mg Nebulization Given 09/29/16 0037)  predniSONE (DELTASONE) tablet 60 mg (60 mg Oral Given 09/29/16 0037)     Initial Impression / Assessment and Plan / ED Course  I have reviewed the triage vital signs and the nursing notes.  Pertinent maging results that were available during my care of the patient were reviewed by me and considered in my medical decision making (see chart for details).     Pt with reported h/o asthma here with cough/wheezing He is improved with neb treatments No distress, resting  comfortably, watching TV CXR negative Will d/c home Advised frequent use of albuterol and start prednisone for 4 days Discussed return precautions with patient and family   Final Clinical Impressions(s) / ED Diagnoses   Final diagnoses:  Moderate persistent asthma with exacerbation    New Prescriptions New Prescriptions   PREDNISONE (DELTASONE) 50 MG TABLET    1 tablet PO QD X4 days   I personally performed the services described in this documentation, which was scribed in my presence. The recorded information has been reviewed and is accurate.        Ripley Fraise, MD 09/29/16 [phone]

## 2016-10-01 MED FILL — TIMOLOL 0.5% EYE DROPS: 0.5 | 30 days supply | Qty: 5 | Fill #5

## 2016-10-01 MED FILL — FLUTICASONE PROP 50 MCG SPR: 50 | 30 days supply | Qty: 16 | Fill #2

## 2016-10-01 MED FILL — LOSARTAN POTASSIUM 25 MG TA: 25 | 30 days supply | Qty: 30 | Fill #1

## 2016-10-01 MED FILL — LATANOPROST 0.005% EYE DRP: 0.005 | 30 days supply | Qty: 3 | Fill #6

## 2016-10-01 MED FILL — $pred forte 1% eye drops: 1 | 100 days supply | Qty: 1 | Fill #0

## 2016-10-04 ENCOUNTER — Encounter: Payer: Self-pay | Admitting: Family Medicine

## 2016-10-04 ENCOUNTER — Ambulatory Visit: Payer: Self-pay | Attending: Family Medicine | Admitting: Family Medicine

## 2016-10-04 ENCOUNTER — Ambulatory Visit: Payer: Self-pay | Admitting: Family Medicine

## 2016-10-04 ENCOUNTER — Other Ambulatory Visit: Payer: Self-pay | Admitting: Family Medicine

## 2016-10-04 VITALS — BP 145/71 | HR 89 | Temp 98.9°F | Ht 70.0 in | Wt 185.0 lb

## 2016-10-04 DIAGNOSIS — J449 Chronic obstructive pulmonary disease, unspecified: Secondary | ICD-10-CM

## 2016-10-04 DIAGNOSIS — H409 Unspecified glaucoma: Secondary | ICD-10-CM | POA: Insufficient documentation

## 2016-10-04 DIAGNOSIS — Z0000 Encounter for general adult medical examination without abnormal findings: Secondary | ICD-10-CM

## 2016-10-04 DIAGNOSIS — Z1159 Encounter for screening for other viral diseases: Secondary | ICD-10-CM

## 2016-10-04 DIAGNOSIS — J441 Chronic obstructive pulmonary disease with (acute) exacerbation: Secondary | ICD-10-CM | POA: Insufficient documentation

## 2016-10-04 DIAGNOSIS — Z87891 Personal history of nicotine dependence: Secondary | ICD-10-CM | POA: Insufficient documentation

## 2016-10-04 DIAGNOSIS — J4541 Moderate persistent asthma with (acute) exacerbation: Secondary | ICD-10-CM | POA: Insufficient documentation

## 2016-10-04 DIAGNOSIS — K219 Gastro-esophageal reflux disease without esophagitis: Secondary | ICD-10-CM | POA: Insufficient documentation

## 2016-10-04 DIAGNOSIS — I10 Essential (primary) hypertension: Secondary | ICD-10-CM | POA: Insufficient documentation

## 2016-10-04 DIAGNOSIS — Z23 Encounter for immunization: Secondary | ICD-10-CM

## 2016-10-04 DIAGNOSIS — Z79899 Other long term (current) drug therapy: Secondary | ICD-10-CM | POA: Insufficient documentation

## 2016-10-04 MED ORDER — PREDNISONE 20 MG PO TABS: ORAL_TABLET | ORAL | 0 refills | Status: DC

## 2016-10-04 MED ORDER — LOSARTAN POTASSIUM 50 MG PO TABS: 25.0000 mg | ORAL_TABLET | Freq: Every day | ORAL | 5 refills | Status: DC

## 2016-10-04 MED ORDER — FLUOROMETHOLONE 0.1 % OP SUSP: 1.0000 [drp] | Freq: Every day | OPHTHALMIC | 11 refills | Status: DC

## 2016-10-04 MED ORDER — DORZOLAMIDE HCL 2 % OP SOLN: 1.0000 [drp] | Freq: Three times a day (TID) | OPHTHALMIC | 3 refills | Status: DC

## 2016-10-04 MED ORDER — BECLOMETHASONE DIPROPIONATE 80 MCG/ACT IN AERS: INHALATION_SPRAY | RESPIRATORY_TRACT | 5 refills | Status: DC

## 2016-10-04 MED ORDER — BRIMONIDINE TARTRATE 0.2 % OP SOLN: 1.0000 [drp] | Freq: Three times a day (TID) | OPHTHALMIC | 3 refills | Status: DC

## 2016-10-04 MED ORDER — LATANOPROST 0.005 % OP SOLN: 1.0000 [drp] | Freq: Every day | OPHTHALMIC | 3 refills | Status: DC

## 2016-10-04 MED ORDER — RANITIDINE HCL 150 MG PO TABS: 150.0000 mg | ORAL_TABLET | Freq: Every day | ORAL | 5 refills | Status: DC

## 2016-10-04 MED ORDER — LOSARTAN POTASSIUM 50 MG PO TABS
25.0000 mg | ORAL_TABLET | Freq: Every day | ORAL | 5 refills | Status: DC
Start: 2016-10-04 — End: 2016-10-04

## 2016-10-04 MED ORDER — ALBUTEROL SULFATE (2.5 MG/3ML) 0.083% IN NEBU: 2.5000 mg | INHALATION_SOLUTION | Freq: Four times a day (QID) | RESPIRATORY_TRACT | 1 refills | Status: DC | PRN

## 2016-10-04 MED ORDER — ALBUTEROL SULFATE HFA 108 (90 BASE) MCG/ACT IN AERS: INHALATION_SPRAY | RESPIRATORY_TRACT | 3 refills | Status: DC

## 2016-10-04 MED FILL — raNITIdine HCL 150 MG TABS: 150 | 30 days supply | Qty: 30 | Fill #0

## 2016-10-04 MED FILL — BRIMONIDINE 0.2% EYE DROP: 0.2 | 30 days supply | Qty: 5 | Fill #0

## 2016-10-04 MED FILL — TAMSULOSIN HCL 0.4 MG CAP: 0.4 | 30 days supply | Qty: 30 | Fill #0

## 2016-10-04 NOTE — Progress Notes (Signed)
Subjective:  Patient ID: Bryan Sullivan, male    DOB: 1945-06-12  Age: 71 y.o. MRN: [id number]  CC: Hypertension   HPI Bryan Sullivan has HTN,  asthma, glaucoma he  presents for   1. ED f/u asthma  exacerbation: he was seen at [facility] ED for cough and shortness of breath. He was diagnosed with COPD exacerbation. He was treated with albuterol neb treatment, and prednisone burst. His CXR was negative for acute infiltrate. Today he reports improvement but persistent shortness of breath with minimal exertion. His BP was also elevated during his visit. He admits to some burning chest pains that occur at night after lying down.   2. CHRONIC HYPERTENSION  Disease Monitoring  Blood pressure range: not checking   Chest pain: no   Dyspnea: no   Claudication: no   Medication compliance: yes  Medication Side Effects  Lightheadedness: no   Urinary frequency: no  Edema: no   Preventitive Healthcare:  Exercise: no   Diet Pattern: eating low salt and low sugar   Salt Restriction: yes    Social History  Substance Use Topics  . Smoking status: Former Smoker    Types: Cigarettes    Quit date: 09/21/1968  . Smokeless tobacco: Never Used     Comment: "quit smoking cigarettes in 1970"  . Alcohol use No    Outpatient Medications Prior to Visit  Medication Sig Dispense Refill  . albuterol (PROAIR HFA) 108 (90 Base) MCG/ACT inhaler INHALE 2 PUFFS INTO THE LUNGS EVERY 2 HOURS AS NEEDED FOR WHEEZING, SHORTNESS OF BREATH, OR COUGH. 3 Inhaler 3  . ALPHAGAN P 0.1 % SOLN Place 1 drop into the right eye 3 (three) times daily.  4  . amLODipine (NORVASC) 5 MG tablet Take 1 tablet (5 mg total) by mouth daily. 90 tablet 3  . Cholecalciferol (VITAMIN D-1000 MAX ST) 1000 units tablet Take 1,000 Units by mouth daily.    . dorzolamide (TRUSOPT) 2 % ophthalmic solution Place 1 drop into the right eye 3 (three) times daily. 20 mL 3  . finasteride (PROSCAR) 5 MG tablet Take 5 mg by mouth daily.     Marland Kitchen ipratropium-albuterol (DUONEB) 0.5-2.5 (3) MG/3ML SOLN Take 3 mLs by nebulization every 6 (six) hours as needed. (Patient taking differently: Take 3 mLs by nebulization every 6 (six) hours as needed (SOB, wheezing). ) 360 mL 2  . latanoprost (XALATAN) 0.005 % ophthalmic solution Place 1 drop into the right eye at bedtime. (Patient taking differently: Place 1 drop into both eyes at bedtime. ) 7.5 mL 3  . losartan (COZAAR) 25 MG tablet Take 1 tablet (25 mg total) by mouth daily. 30 tablet 2  . predniSONE (DELTASONE) 50 MG tablet 1 tablet PO QD X4 days 4 tablet 0  . tamsulosin (FLOMAX) 0.4 MG CAPS capsule Take 1 capsule (0.4 mg total) by mouth daily. 30 capsule 11  . timolol (BETIMOL) 0.5 % ophthalmic solution Place 1 drop into both eyes 2 (two) times daily. (Patient taking differently: Place 1 drop into the right eye 2 (two) times daily. ) 15 mL 3  . beclomethasone (QVAR) 80 MCG/ACT inhaler INHALE 2 PUFFS INTO THE LUNGS 2 TIMES DAILY. (Patient not taking: Reported on 09/29/2016) 3 Inhaler 0  . brimonidine (ALPHAGAN) 0.2 % ophthalmic solution Place 1 drop into the right eye 3 (three) times daily. (Patient not taking: Reported on 09/29/2016) 30 mL 3  . fluticasone (FLONASE) 50 MCG/ACT nasal spray Place 2 sprays into both nostrils daily. (Patient  not taking: Reported on 09/29/2016) 16 g 2  . meclizine (ANTIVERT) 25 MG tablet Take 1 tablet (25 mg total) by mouth 3 (three) times daily as needed for dizziness. (Patient not taking: Reported on 09/29/2016) 30 tablet 0  . prednisoLONE acetate (PRED FORTE) 1 % ophthalmic suspension Place 1 drop into the right eye 2 (two) times daily. (Patient not taking: Reported on 09/29/2016) 10 mL 3   No facility-administered medications prior to visit.     ROS Review of Systems  Constitutional: Negative for chills, fatigue, fever and unexpected weight change.  HENT: Positive for ear pain.   Eyes: Negative for visual disturbance.  Respiratory: Positive for shortness of breath.  Negative for cough.   Cardiovascular: Negative for chest pain, palpitations and leg swelling.  Gastrointestinal: Negative for abdominal pain, blood in stool, constipation, diarrhea, nausea and vomiting.  Endocrine: Negative for polydipsia, polyphagia and polyuria.  Musculoskeletal: Negative for arthralgias, back pain, gait problem, myalgias and neck pain.  Skin: Negative for rash.  Allergic/Immunologic: Negative for immunocompromised state.  Hematological: Negative for adenopathy. Does not bruise/bleed easily.  Psychiatric/Behavioral: Positive for sleep disturbance (trouble sleeping during the day, up at night ). Negative for dysphoric mood and suicidal ideas. The patient is not nervous/anxious.     Objective:  BP (!) 145/71   Pulse 89   Temp 98.9 F (37.2 C) (Oral)   Ht 5\' 10"  (1.778 m)   Wt 185 lb (83.9 kg)   SpO2 97%   BMI 26.54 kg/m   BP/Weight 10/04/2016 09/29/2016 3/54/6568  Systolic BP 127 517 001  Diastolic BP 71 78 71  Wt. (Lbs) 185 - 188.8  BMI 26.54 - 27.09     Physical Exam  Constitutional: He appears well-developed and well-nourished. No distress.  HENT:  Head: Normocephalic and atraumatic.  Right Ear: Tympanic membrane, external ear and ear canal normal.  Left Ear: Tympanic membrane, external ear and ear canal normal.  Neck: Normal range of motion. Neck supple.  Cardiovascular: Normal rate, regular rhythm, normal heart sounds and intact distal pulses.   Pulmonary/Chest: Effort normal. He has wheezes.  Musculoskeletal: He exhibits no edema.  Neurological: He is alert.  Skin: Skin is warm and dry. No rash noted. No erythema.  Psychiatric: He has a normal mood and affect.    Lab Results  Component Value Date   HGBA1C 5.9 07/26/2016    Assessment & Plan:  Bryan Sullivan was seen today for hypertension.  Diagnoses and all orders for this visit:  Essential hypertension -     Discontinue: losartan (COZAAR) 50 MG tablet; Take 0.5 tablets (25 mg total) by mouth  daily. -     losartan (COZAAR) 50 MG tablet; Take 0.5 tablets (25 mg total) by mouth daily.  Chronic obstructive airway disease with asthma ([facility]) -     Discontinue: beclomethasone (QVAR) 80 MCG/ACT inhaler; INHALE 2 PUFFS INTO THE LUNGS 2 TIMES DAILY. -     albuterol (PROAIR HFA) 108 (90 Base) MCG/ACT inhaler; INHALE 2 PUFFS INTO THE LUNGS EVERY 2 HOURS AS NEEDED FOR WHEEZING, SHORTNESS OF BREATH, OR COUGH. -     albuterol (PROVENTIL) (2.5 MG/3ML) 0.083% nebulizer solution; Take 3 mLs (2.5 mg total) by nebulization every 6 (six) hours as needed for wheezing or shortness of breath. -     beclomethasone (QVAR) 80 MCG/ACT inhaler; INHALE 2 PUFFS INTO THE LUNGS 2 TIMES DAILY.  Glaucoma of right eye, unspecified glaucoma type -     Discontinue: brimonidine (ALPHAGAN) 0.2 % ophthalmic solution;  Place 1 drop into the right eye 3 (three) times daily. -     Discontinue: dorzolamide (TRUSOPT) 2 % ophthalmic solution; Place 1 drop into the right eye 3 (three) times daily. -     Discontinue: latanoprost (XALATAN) 0.005 % ophthalmic solution; Place 1 drop into the right eye at bedtime. -     Discontinue: brimonidine (ALPHAGAN) 0.2 % ophthalmic solution; Place 1 drop into the right eye 3 (three) times daily. -     Discontinue: dorzolamide (TRUSOPT) 2 % ophthalmic solution; Place 1 drop into the right eye 3 (three) times daily. -     Discontinue: latanoprost (XALATAN) 0.005 % ophthalmic solution; Place 1 drop into the right eye at bedtime. -     Discontinue: fluorometholone (FML) 0.1 % ophthalmic suspension; Place 1 drop into both eyes daily. -     fluorometholone (FML) 0.1 % ophthalmic suspension; Place 1 drop into both eyes daily. -     Discontinue: dorzolamide (TRUSOPT) 2 % ophthalmic solution; Place 1 drop into the right eye 3 (three) times daily. -     brimonidine (ALPHAGAN) 0.2 % ophthalmic solution; Place 1 drop into the right eye 3 (three) times daily. -     latanoprost (XALATAN) 0.005 % ophthalmic  solution; Place 1 drop into the right eye at bedtime. -     dorzolamide (TRUSOPT) 2 % ophthalmic solution; Place 1 drop into the right eye 3 (three) times daily.  Need for hepatitis C screening test -     Hepatitis C antibody  Healthcare maintenance -     Ambulatory referral to Gastroenterology  Moderate persistent asthma with exacerbation -     Discontinue: predniSONE (DELTASONE) 20 MG tablet; Take 40 mg daily for 2 days, 20 mg daily for 3 days then 10 mg daily for 4 days then STOP -     predniSONE (DELTASONE) 20 MG tablet; Take 40 mg daily for 2 days, 20 mg daily for 3 days then 10 mg daily for 4 days then STOP  Gastroesophageal reflux disease, esophagitis presence not specified -     ranitidine (ZANTAC) 150 MG tablet; Take 1 tablet (150 mg total) by mouth at bedtime.  Other orders -     Pneumococcal conjugate vaccine 13-valent IM   There are no diagnoses linked to this encounter.  No orders of the defined types were placed in this encounter.   Follow-up: Return in about 2 weeks (around 10/18/2016) for asthma exacerbation.   Boykin Nearing MD

## 2016-10-04 NOTE — Patient Instructions (Addendum)
Bryan Sullivan was seen today for hypertension.  Diagnoses and all orders for this visit:  Essential hypertension -     losartan (COZAAR) 50 MG tablet; Take 0.5 tablets (25 mg total) by mouth daily.  Chronic obstructive airway disease with asthma ([facility]) -     beclomethasone (QVAR) 80 MCG/ACT inhaler; INHALE 2 PUFFS INTO THE LUNGS 2 TIMES DAILY. -     albuterol (PROAIR HFA) 108 (90 Base) MCG/ACT inhaler; INHALE 2 PUFFS INTO THE LUNGS EVERY 2 HOURS AS NEEDED FOR WHEEZING, SHORTNESS OF BREATH, OR COUGH. -     albuterol (PROVENTIL) (2.5 MG/3ML) 0.083% nebulizer solution; Take 3 mLs (2.5 mg total) by nebulization every 6 (six) hours as needed for wheezing or shortness of breath.  Glaucoma of right eye, unspecified glaucoma type -     Discontinue: brimonidine (ALPHAGAN) 0.2 % ophthalmic solution; Place 1 drop into the right eye 3 (three) times daily. -     Discontinue: dorzolamide (TRUSOPT) 2 % ophthalmic solution; Place 1 drop into the right eye 3 (three) times daily. -     Discontinue: latanoprost (XALATAN) 0.005 % ophthalmic solution; Place 1 drop into the right eye at bedtime. -     Discontinue: brimonidine (ALPHAGAN) 0.2 % ophthalmic solution; Place 1 drop into the right eye 3 (three) times daily. -     Discontinue: dorzolamide (TRUSOPT) 2 % ophthalmic solution; Place 1 drop into the right eye 3 (three) times daily. -     Discontinue: latanoprost (XALATAN) 0.005 % ophthalmic solution; Place 1 drop into the right eye at bedtime. -     Discontinue: fluorometholone (FML) 0.1 % ophthalmic suspension; Place 1 drop into both eyes daily. -     fluorometholone (FML) 0.1 % ophthalmic suspension; Place 1 drop into both eyes daily. -     dorzolamide (TRUSOPT) 2 % ophthalmic solution; Place 1 drop into the right eye 3 (three) times daily. -     brimonidine (ALPHAGAN) 0.2 % ophthalmic solution; Place 1 drop into the right eye 3 (three) times daily. -     latanoprost (XALATAN) 0.005 % ophthalmic solution; Place  1 drop into the right eye at bedtime.  Need for hepatitis C screening test -     Hepatitis C antibody  Healthcare maintenance -     Ambulatory referral to Gastroenterology  Moderate persistent asthma with exacerbation -     predniSONE (DELTASONE) 20 MG tablet; Take 40 mg daily for 2 days, 20 mg daily for 3 days then 10 mg daily for 4 days then STOP  Gastroesophageal reflux disease, esophagitis presence not specified -     ranitidine (ZANTAC) 150 MG tablet; Take 1 tablet (150 mg total) by mouth at bedtime.    f/u in 2 weeks for asthma exacerbation   Dr. Adrian Blackwater

## 2016-10-05 ENCOUNTER — Other Ambulatory Visit: Payer: Self-pay | Admitting: Pharmacist

## 2016-10-05 LAB — HEPATITIS C ANTIBODY: Hep C Virus Ab: <0.1 s/co ratio (ref 0.0–0.9)

## 2016-10-05 MED ORDER — BECLOMETHASONE DIPROP HFA 80 MCG/ACT IN AERB
2.0000 | INHALATION_SPRAY | Freq: Two times a day (BID) | RESPIRATORY_TRACT | 2 refills | Status: DC
Start: 2016-10-05 — End: 2016-10-25

## 2016-10-05 MED FILL — FLUOROMETHOLONE 0.1% DROPS: 0.1 | 30 days supply | Qty: 5 | Fill #0

## 2016-10-05 MED FILL — $Qvar 80mcg Oral Inhaler: 30 days supply | Qty: 1 | Fill #0

## 2016-10-05 MED FILL — predniSONE 20 MG TABS: 20 | 9 days supply | Qty: 9 | Fill #0

## 2016-10-05 MED FILL — ALBUTEROL 0.083% INHAL SOLN: (2.5 MG/3ML | 15 days supply | Qty: 180 | Fill #0

## 2016-10-08 DIAGNOSIS — K219 Gastro-esophageal reflux disease without esophagitis: Secondary | ICD-10-CM | POA: Insufficient documentation

## 2016-10-08 DIAGNOSIS — J4541 Moderate persistent asthma with (acute) exacerbation: Secondary | ICD-10-CM | POA: Insufficient documentation

## 2016-10-08 MED ORDER — LOSARTAN POTASSIUM 50 MG PO TABS: 50.0000 mg | ORAL_TABLET | Freq: Every day | ORAL | 5 refills | Status: DC

## 2016-10-08 NOTE — Assessment & Plan Note (Signed)
gerd symptoms Previously on PPI Add H2 blocker

## 2016-10-08 NOTE — Assessment & Plan Note (Signed)
Asthma with persistent mild exacerbation Plan: Longer prednisone taper Continue Qvar Continue albuterol prn

## 2016-10-08 NOTE — Assessment & Plan Note (Signed)
A: Slightly elevated P: Increase losartan from 25 to 50 mg daily Continue Norvasc 5 mg daily

## 2016-10-09 MED FILL — LOSARTAN POTASSIUM 50 MG TA: 50 | 30 days supply | Qty: 30 | Fill #0

## 2016-10-10 ENCOUNTER — Encounter: Payer: Self-pay | Admitting: Family Medicine

## 2016-10-10 ENCOUNTER — Ambulatory Visit: Payer: Self-pay | Attending: Family Medicine

## 2016-10-17 ENCOUNTER — Telehealth: Payer: Self-pay

## 2016-10-17 NOTE — Telephone Encounter (Signed)
Pt was called and informed of lab results. 

## 2016-10-25 ENCOUNTER — Ambulatory Visit: Payer: Self-pay | Attending: Family Medicine | Admitting: Family Medicine

## 2016-10-25 ENCOUNTER — Encounter: Payer: Self-pay | Admitting: Family Medicine

## 2016-10-25 VITALS — BP 129/73 | HR 75 | Temp 98.1°F | Wt 184.0 lb

## 2016-10-25 DIAGNOSIS — H409 Unspecified glaucoma: Secondary | ICD-10-CM | POA: Insufficient documentation

## 2016-10-25 DIAGNOSIS — Z7951 Long term (current) use of inhaled steroids: Secondary | ICD-10-CM | POA: Insufficient documentation

## 2016-10-25 DIAGNOSIS — I10 Essential (primary) hypertension: Secondary | ICD-10-CM | POA: Insufficient documentation

## 2016-10-25 DIAGNOSIS — J449 Chronic obstructive pulmonary disease, unspecified: Secondary | ICD-10-CM | POA: Insufficient documentation

## 2016-10-25 DIAGNOSIS — Z87891 Personal history of nicotine dependence: Secondary | ICD-10-CM | POA: Insufficient documentation

## 2016-10-25 MED ORDER — BUDESONIDE-FORMOTEROL FUMARATE 80-4.5 MCG/ACT IN AERO: 2.0000 | INHALATION_SPRAY | Freq: Two times a day (BID) | RESPIRATORY_TRACT | 3 refills | Status: DC

## 2016-10-25 MED ORDER — BECLOMETHASONE DIPROP HFA 80 MCG/ACT IN AERB
2.0000 | INHALATION_SPRAY | Freq: Two times a day (BID) | RESPIRATORY_TRACT | 2 refills | Status: DC
Start: 2016-10-25 — End: 2016-10-25

## 2016-10-25 MED ORDER — ALBUTEROL SULFATE HFA 108 (90 BASE) MCG/ACT IN AERS
2.0000 | INHALATION_SPRAY | Freq: Four times a day (QID) | RESPIRATORY_TRACT | 0 refills | Status: DC | PRN
Start: 2016-10-25 — End: 2016-10-25

## 2016-10-25 MED ORDER — ALBUTEROL SULFATE HFA 108 (90 BASE) MCG/ACT IN AERS: 2.0000 | INHALATION_SPRAY | Freq: Four times a day (QID) | RESPIRATORY_TRACT | 0 refills | Status: DC | PRN

## 2016-10-25 MED ORDER — ALBUTEROL SULFATE HFA 108 (90 BASE) MCG/ACT IN AERS: INHALATION_SPRAY | RESPIRATORY_TRACT | 3 refills | Status: DC

## 2016-10-25 MED FILL — !VENTOLIN HFA INHALER: 108 (90 BAS | 67 days supply | Qty: 18 | Fill #0

## 2016-10-25 MED FILL — **SYMBICORT 80-4.5 MCG INHA: 80-4.5 | 15 days supply | Qty: 1 | Fill #0

## 2016-10-25 NOTE — Progress Notes (Signed)
Subjective:  Patient ID: Bryan Sullivan, male    DOB: 22-Dec-1945  Age: 71 y.o. MRN: [id number]  CC: Asthma   HPI Bryan Sullivan has HTN,  asthma, glaucoma he  presents for   1. Asthma: he reports persistent chest tightness. Denies shortness of breath and excessive cough. No fever or chills. Compliant with Qvar. Using albuterol most days. Reports proventil/ventolin inhaler works better that [employer].   2. Trouble sleeping: he rest in bed most of the day. He has trouble sleeping at night. This has been a problem for several months. When the weather is nice her walks outside for exercise.   Social History  Substance Use Topics  . Smoking status: Former Smoker    Types: Cigarettes    Quit date: 09/21/1968  . Smokeless tobacco: Never Used     Comment: "quit smoking cigarettes in 1970"  . Alcohol use No    Outpatient Medications Prior to Visit  Medication Sig Dispense Refill  . albuterol (PROAIR HFA) 108 (90 Base) MCG/ACT inhaler INHALE 2 PUFFS INTO THE LUNGS EVERY 2 HOURS AS NEEDED FOR WHEEZING, SHORTNESS OF BREATH, OR COUGH. 3 Inhaler 3  . albuterol (PROVENTIL) (2.5 MG/3ML) 0.083% nebulizer solution Take 3 mLs (2.5 mg total) by nebulization every 6 (six) hours as needed for wheezing or shortness of breath. 150 mL 1  . amLODipine (NORVASC) 5 MG tablet Take 1 tablet (5 mg total) by mouth daily. 90 tablet 3  . beclomethasone (QVAR) 80 MCG/ACT inhaler INHALE 2 PUFFS INTO THE LUNGS 2 TIMES DAILY. 3 Inhaler 5  . Beclomethasone Diprop HFA (QVAR REDIHALER) 80 MCG/ACT AERB Inhale 2 puffs into the lungs 2 (two) times daily. 1 Inhaler 2  . brimonidine (ALPHAGAN) 0.2 % ophthalmic solution Place 1 drop into the right eye 3 (three) times daily. 30 mL 3  . Cholecalciferol (VITAMIN D-1000 MAX ST) 1000 units tablet Take 1,000 Units by mouth daily.    . dorzolamide (TRUSOPT) 2 % ophthalmic solution Place 1 drop into the right eye 3 (three) times daily. 20 mL 3  . finasteride (PROSCAR) 5 MG tablet  Take 5 mg by mouth daily.    . fluorometholone (FML) 0.1 % ophthalmic suspension Place 1 drop into both eyes daily. 5 mL 11  . latanoprost (XALATAN) 0.005 % ophthalmic solution Place 1 drop into the right eye at bedtime. 7.5 mL 3  . losartan (COZAAR) 50 MG tablet Take 1 tablet (50 mg total) by mouth daily. 30 tablet 5  . predniSONE (DELTASONE) 20 MG tablet Take 40 mg daily for 2 days, 20 mg daily for 3 days then 10 mg daily for 4 days then STOP 9 tablet 0  . ranitidine (ZANTAC) 150 MG tablet Take 1 tablet (150 mg total) by mouth at bedtime. 30 tablet 5  . tamsulosin (FLOMAX) 0.4 MG CAPS capsule Take 1 capsule (0.4 mg total) by mouth daily. 30 capsule 11  . fluticasone (FLONASE) 50 MCG/ACT nasal spray Place 2 sprays into both nostrils daily. (Patient not taking: Reported on 09/29/2016) 16 g 2   No facility-administered medications prior to visit.     ROS Review of Systems  Constitutional: Negative for chills, fatigue, fever and unexpected weight change.  HENT: Negative for ear pain.   Eyes: Negative for visual disturbance.  Respiratory: Negative for cough and shortness of breath.   Cardiovascular: Positive for chest pain. Negative for palpitations and leg swelling.  Gastrointestinal: Negative for abdominal pain, blood in stool, constipation, diarrhea, nausea and vomiting.  Endocrine: Negative  for polydipsia, polyphagia and polyuria.  Musculoskeletal: Negative for arthralgias, back pain, gait problem, myalgias and neck pain.  Skin: Negative for rash.  Allergic/Immunologic: Negative for immunocompromised state.  Hematological: Negative for adenopathy. Does not bruise/bleed easily.  Psychiatric/Behavioral: Positive for sleep disturbance (trouble sleeping during the day, up at night ). Negative for dysphoric mood and suicidal ideas. The patient is not nervous/anxious.     Objective:  BP 129/73   Pulse 75   Temp 98.1 F (36.7 C) (Oral)   Wt 184 lb (83.5 kg)   SpO2 100%   BMI 26.40 kg/m    BP/Weight 10/25/2016 09/30/3974 11/28/4191  Systolic BP 790 240 973  Diastolic BP 73 71 78  Wt. (Lbs) 184 185 -  BMI 26.4 26.54 -    Physical Exam  Constitutional: He appears well-developed and well-nourished. No distress.  HENT:  Head: Normocephalic and atraumatic.  Right Ear: Tympanic membrane, external ear and ear canal normal.  Left Ear: Tympanic membrane, external ear and ear canal normal.  Neck: Normal range of motion. Neck supple.  Cardiovascular: Normal rate, regular rhythm, normal heart sounds and intact distal pulses.   Pulmonary/Chest: No respiratory distress. He has no wheezes. He has no rales.  Musculoskeletal: He exhibits no edema.  Neurological: He is alert.  Skin: Skin is warm and dry. No rash noted. No erythema.  Psychiatric: He has a normal mood and affect.    Lab Results  Component Value Date   HGBA1C 5.9 07/26/2016    Assessment & Plan:  Bryan Sullivan was seen today for asthma.  Diagnoses and all orders for this visit:  Chronic obstructive airway disease with asthma ([place]) -     Discontinue: budesonide-formoterol (SYMBICORT) 80-4.5 MCG/ACT inhaler; Inhale 2 puffs into the lungs 2 (two) times daily. -     Discontinue: albuterol (PROVENTIL HFA;VENTOLIN HFA) 108 (90 Base) MCG/ACT inhaler; Inhale 2 puffs into the lungs every 6 (six) hours as needed for wheezing or shortness of breath. -     Discontinue: albuterol (PROAIR HFA) 108 (90 Base) MCG/ACT inhaler; INHALE 2 PUFFS INTO THE LUNGS EVERY 2 HOURS AS NEEDED FOR WHEEZING, SHORTNESS OF BREATH, OR COUGH. -     budesonide-formoterol (SYMBICORT) 80-4.5 MCG/ACT inhaler; Inhale 2 puffs into the lungs 2 (two) times daily. -     albuterol (PROVENTIL HFA;VENTOLIN HFA) 108 (90 Base) MCG/ACT inhaler; Inhale 2 puffs into the lungs every 6 (six) hours as needed for wheezing or shortness of breath.  Other orders -     Discontinue: beclomethasone (QVAR REDIHALER) 80 MCG/ACT inhaler; Inhale 2 puffs into the lungs 2 (two) times  daily.   There are no diagnoses linked to this encounter.  No orders of the defined types were placed in this encounter.   Follow-up: Return in about 4 weeks (around 11/22/2016) for asthma.   Boykin Nearing MD

## 2016-10-25 NOTE — Patient Instructions (Signed)
Arieon was seen today for asthma.  Diagnoses and all orders for this visit:  Chronic obstructive airway disease with asthma ([place]) -     budesonide-formoterol (SYMBICORT) 80-4.5 MCG/ACT inhaler; Inhale 2 puffs into the lungs 2 (two) times daily. -     albuterol (PROVENTIL HFA;VENTOLIN HFA) 108 (90 Base) MCG/ACT inhaler; Inhale 2 puffs into the lungs every 6 (six) hours as needed for wheezing or shortness of breath.   Work on sleep hygiene: Bedroom is for sleep only Avoid resting in your bed during the day  If you cannot sleep within 20 minutes do the following: Get out of bed and go to dimly lit room Read a book You may drink water or chamomile unsweetened tea-avoid food and any other beverage Avoid the phone and TV   Stop QVAR Start symbicort Proventil/ventolin ordered to replace proair   F/u in 4 weeks for asthma  Dr. Adrian Blackwater

## 2016-10-26 NOTE — Assessment & Plan Note (Signed)
A: COPD with asthma, chest tightness and daily albuterol use reported P: D/c QVAR Start Symbicort Changed proair to proventil

## 2016-11-16 ENCOUNTER — Other Ambulatory Visit: Payer: Self-pay | Admitting: Family Medicine

## 2016-11-16 DIAGNOSIS — J4541 Moderate persistent asthma with (acute) exacerbation: Secondary | ICD-10-CM

## 2016-11-16 DIAGNOSIS — J449 Chronic obstructive pulmonary disease, unspecified: Secondary | ICD-10-CM

## 2016-11-16 MED FILL — TAMSULOSIN HCL 0.4 MG CAP: 0.4 | 30 days supply | Qty: 30 | Fill #1

## 2016-11-16 MED FILL — FLUOROMETHOLONE 0.1% DROPS: 0.1 | 30 days supply | Qty: 5 | Fill #1

## 2016-11-16 MED FILL — $Qvar 80mcg Oral Inhaler: 30 days supply | Qty: 1 | Fill #2

## 2016-11-16 MED FILL — LATANOPROST 0.005% EYE DRP: 0.005 | 90 days supply | Qty: 8 | Fill #0

## 2016-11-16 MED FILL — raNITIdine HCL 150 MG TABS: 150 | 30 days supply | Qty: 30 | Fill #1

## 2016-11-16 MED FILL — LOSARTAN POTASSIUM 50 MG TA: 50 | 30 days supply | Qty: 30 | Fill #1

## 2016-11-16 MED FILL — PROAIR HFA 90 MCG INHALER: 108 (90 BAS | 8 days supply | Qty: 9 | Fill #0

## 2016-11-16 MED FILL — BRIMONIDINE 0.2% EYE DROP: 0.2 | 30 days supply | Qty: 5 | Fill #1

## 2016-11-16 MED FILL — ALBUTEROL 0.083% INHAL SOLN: (2.5 MG/3ML | 15 days supply | Qty: 180 | Fill #1

## 2016-11-16 MED FILL — AMLODIPINE BESYLATE 5 MG TA: 5 | 30 days supply | Qty: 30 | Fill #0

## 2016-11-19 ENCOUNTER — Other Ambulatory Visit: Payer: Self-pay | Admitting: Family Medicine

## 2016-11-19 DIAGNOSIS — J449 Chronic obstructive pulmonary disease, unspecified: Secondary | ICD-10-CM

## 2016-11-19 MED ORDER — ALBUTEROL SULFATE HFA 108 (90 BASE) MCG/ACT IN AERS: 2.0000 | INHALATION_SPRAY | Freq: Four times a day (QID) | RESPIRATORY_TRACT | 0 refills | Status: DC | PRN

## 2016-11-26 ENCOUNTER — Encounter: Payer: Self-pay | Admitting: Family Medicine

## 2016-11-26 ENCOUNTER — Ambulatory Visit: Payer: Self-pay | Attending: Family Medicine | Admitting: Family Medicine

## 2016-11-26 VITALS — BP 135/78 | HR 79 | Temp 98.1°F | Wt 188.0 lb

## 2016-11-26 DIAGNOSIS — I10 Essential (primary) hypertension: Secondary | ICD-10-CM

## 2016-11-26 DIAGNOSIS — J449 Chronic obstructive pulmonary disease, unspecified: Secondary | ICD-10-CM

## 2016-11-26 DIAGNOSIS — Z79899 Other long term (current) drug therapy: Secondary | ICD-10-CM | POA: Insufficient documentation

## 2016-11-26 DIAGNOSIS — H409 Unspecified glaucoma: Secondary | ICD-10-CM

## 2016-11-26 DIAGNOSIS — Z87891 Personal history of nicotine dependence: Secondary | ICD-10-CM | POA: Insufficient documentation

## 2016-11-26 MED ORDER — BRIMONIDINE TARTRATE 0.2 % OP SOLN: 1.0000 [drp] | Freq: Three times a day (TID) | OPHTHALMIC | 3 refills | Status: DC

## 2016-11-26 MED ORDER — FLUOROMETHOLONE 0.1 % OP SUSP: 1.0000 [drp] | Freq: Every day | OPHTHALMIC | 3 refills | Status: DC

## 2016-11-26 MED ORDER — BECLOMETHASONE DIPROPIONATE 80 MCG/ACT IN AERS: 2.0000 | INHALATION_SPRAY | Freq: Two times a day (BID) | RESPIRATORY_TRACT | 3 refills | Status: DC

## 2016-11-26 MED ORDER — LOSARTAN POTASSIUM 50 MG PO TABS: 50.0000 mg | ORAL_TABLET | Freq: Every day | ORAL | 3 refills | Status: DC

## 2016-11-26 MED ORDER — TIMOLOL MALEATE 0.5 % OP SOLN: 1.0000 [drp] | Freq: Two times a day (BID) | OPHTHALMIC | 3 refills | Status: DC

## 2016-11-26 MED ORDER — LATANOPROST 0.005 % OP SOLN: 1.0000 [drp] | Freq: Every day | OPHTHALMIC | 3 refills | Status: DC

## 2016-11-26 MED ORDER — DORZOLAMIDE HCL 2 % OP SOLN: 1.0000 [drp] | Freq: Three times a day (TID) | OPHTHALMIC | 3 refills | Status: DC

## 2016-11-26 MED ORDER — AMLODIPINE BESYLATE 5 MG PO TABS: 5.0000 mg | ORAL_TABLET | Freq: Every day | ORAL | 3 refills | Status: DC

## 2016-11-26 NOTE — Assessment & Plan Note (Signed)
Changed back to QVAR

## 2016-11-26 NOTE — Progress Notes (Signed)
Subjective:  Patient ID: Bryan Sullivan, male    DOB: March 10, 1946  Age: 71 y.o. MRN: [id number]  CC: Asthma and Hypertension   HPI Bryan Sullivan has HTN,  asthma, glaucoma he presents with his son for   1. Asthma: he has COPD with asthma. At his last visit the Qvar was changed to Symbicort and the pro air was changed to proven til. Today he reports, not taking symbicort. He reports Qvar seemed to work better. He denies cough, wheezing, CP and SOB.   2. HTN: compliant with losartan 50 mg daily. Norvasc 5 mg daily. No HA, CP or SOB. Elevated BP at home with SBP in160s and DBP in 80s over last week. No increase salt. No leg swelling. Of note he also saw [facility], his BP was 184/89 in the office on 11/22/16.   3. Glaucoma: followed by opthalmology at [facility]. Last visit was 11/20/16. Timolol was restarted due to elevated IOP when it was stopped. He is travelling to [place] at the end of the month and request 90 day supply of medications.   Social History  Substance Use Topics  . Smoking status: Former Smoker    Types: Cigarettes    Quit date: 09/21/1968  . Smokeless tobacco: Never Used     Comment: "quit smoking cigarettes in 1970"  . Alcohol use No    Outpatient Medications Prior to Visit  Medication Sig Dispense Refill  . albuterol (PROVENTIL HFA;VENTOLIN HFA) 108 (90 Base) MCG/ACT inhaler Inhale 2 puffs into the lungs every 6 (six) hours as needed for wheezing or shortness of breath. 1 Inhaler 0  . albuterol (PROVENTIL) (2.5 MG/3ML) 0.083% nebulizer solution USE 3 MLS (2.5 MG TOTAL) BY NEBULIZATION EVERY 6 (SIX) HOURS AS NEEDED FOR WHEEZING OR SHORTNESS OF BREATH. 180 mL 1  . amLODipine (NORVASC) 5 MG tablet Take 1 tablet (5 mg total) by mouth daily. 90 tablet 3  . brimonidine (ALPHAGAN) 0.2 % ophthalmic solution Place 1 drop into the right eye 3 (three) times daily. 30 mL 3  . Cholecalciferol (VITAMIN D-1000 MAX ST) 1000 units tablet Take 1,000 Units by mouth daily.    .  dorzolamide (TRUSOPT) 2 % ophthalmic solution Place 1 drop into the right eye 3 (three) times daily. 20 mL 3  . finasteride (PROSCAR) 5 MG tablet Take 5 mg by mouth daily.    . fluorometholone (FML) 0.1 % ophthalmic suspension Place 1 drop into both eyes daily. 5 mL 11  . fluticasone (FLONASE) 50 MCG/ACT nasal spray Place 2 sprays into both nostrils daily. 16 g 2  . latanoprost (XALATAN) 0.005 % ophthalmic solution Place 1 drop into the right eye at bedtime. 7.5 mL 3  . losartan (COZAAR) 50 MG tablet Take 1 tablet (50 mg total) by mouth daily. 30 tablet 5  . PROAIR HFA 108 (90 Base) MCG/ACT inhaler INHALE 2 PUFFS INTO THE LUNGS EVERY 2 HOURS AS NEEDED FOR WHEEZING, SHORTNESS OF BREATH, OR COUGH. 8.5 g 2  . ranitidine (ZANTAC) 150 MG tablet Take 1 tablet (150 mg total) by mouth at bedtime. 30 tablet 5  . tamsulosin (FLOMAX) 0.4 MG CAPS capsule Take 1 capsule (0.4 mg total) by mouth daily. 30 capsule 11  . budesonide-formoterol (SYMBICORT) 80-4.5 MCG/ACT inhaler Inhale 2 puffs into the lungs 2 (two) times daily. (Patient not taking: Reported on 11/26/2016) 1 Inhaler 3   No facility-administered medications prior to visit.     ROS Review of Systems  Constitutional: Negative for chills, fatigue, fever  and unexpected weight change.  HENT: Negative for ear pain.   Eyes: Negative for visual disturbance.  Respiratory: Negative for cough and shortness of breath.   Cardiovascular: Positive for chest pain. Negative for palpitations and leg swelling.  Gastrointestinal: Negative for abdominal pain, blood in stool, constipation, diarrhea, nausea and vomiting.  Endocrine: Negative for polydipsia, polyphagia and polyuria.  Musculoskeletal: Negative for arthralgias, back pain, gait problem, myalgias and neck pain.  Skin: Negative for rash.  Allergic/Immunologic: Negative for immunocompromised state.  Hematological: Negative for adenopathy. Does not bruise/bleed easily.  Psychiatric/Behavioral: Positive for  sleep disturbance (trouble sleeping during the day, up at night ). Negative for dysphoric mood and suicidal ideas. The patient is not nervous/anxious.     Objective:  BP 135/78   Pulse 79   Temp 98.1 F (36.7 C) (Oral)   Wt 188 lb (85.3 kg)   SpO2 98%   BMI 26.98 kg/m   BP/Weight 11/26/2016 10/25/2016 3/54/6568  Systolic BP 127 517 001  Diastolic BP 78 73 71  Wt. (Lbs) 188 184 185  BMI 26.98 26.4 26.54    Physical Exam  Constitutional: He appears well-developed and well-nourished. No distress.  HENT:  Head: Normocephalic and atraumatic.  Right Ear: Tympanic membrane, external ear and ear canal normal.  Left Ear: Tympanic membrane, external ear and ear canal normal.  Neck: Normal range of motion. Neck supple. Carotid bruit is not present.  Cardiovascular: Normal rate, regular rhythm, normal heart sounds and intact distal pulses.   Pulses:      Radial pulses are 2+ on the right side, and 2+ on the left side.  Pulmonary/Chest: No respiratory distress. He has no wheezes. He has no rales.  Musculoskeletal: He exhibits no edema.  Neurological: He is alert.  Skin: Skin is warm and dry. No rash noted. No erythema.  Psychiatric: He has a normal mood and affect.    Lab Results  Component Value Date   HGBA1C 5.9 07/26/2016    Assessment & Plan:  Bryan Sullivan was seen today for asthma and hypertension.  Diagnoses and all orders for this visit:  Essential hypertension -     BMP8+EGFR -     amLODipine (NORVASC) 5 MG tablet; Take 1 tablet (5 mg total) by mouth daily. -     losartan (COZAAR) 50 MG tablet; Take 1 tablet (50 mg total) by mouth daily.  Glaucoma of right eye, unspecified glaucoma type -     brimonidine (ALPHAGAN) 0.2 % ophthalmic solution; Place 1 drop into the right eye 3 (three) times daily. -     dorzolamide (TRUSOPT) 2 % ophthalmic solution; Place 1 drop into the right eye 3 (three) times daily. -     fluorometholone (FML) 0.1 % ophthalmic suspension; Place 1 drop  into both eyes daily. -     latanoprost (XALATAN) 0.005 % ophthalmic solution; Place 1 drop into both eyes at bedtime. -     timolol (TIMOPTIC) 0.5 % ophthalmic solution; Place 1 drop into the right eye 2 (two) times daily.  Chronic obstructive airway disease with asthma ([facility]) -     CBC -     beclomethasone (QVAR) 80 MCG/ACT inhaler; Inhale 2 puffs into the lungs 2 (two) times daily.   There are no diagnoses linked to this encounter.  No orders of the defined types were placed in this encounter.   Follow-up: Return in about 2 weeks (around 12/10/2016) for BP check.   Boykin Nearing MD

## 2016-11-26 NOTE — Assessment & Plan Note (Signed)
Initially hypertensive but then normotensive on repeat check P: BMP  Continue losartan 50 mg daily with plan to increase to 100 mg daily if BP elevated Continue Norvasc 5 mg daily

## 2016-11-26 NOTE — Assessment & Plan Note (Signed)
Resent all eye drops for 90 day supply

## 2016-11-26 NOTE — Patient Instructions (Signed)
Robyn was seen today for asthma and hypertension.  Diagnoses and all orders for this visit:  Essential hypertension -     BMP8+EGFR  Glaucoma of right eye, unspecified glaucoma type -     brimonidine (ALPHAGAN) 0.2 % ophthalmic solution; Place 1 drop into the right eye 3 (three) times daily. -     dorzolamide (TRUSOPT) 2 % ophthalmic solution; Place 1 drop into the right eye 3 (three) times daily. -     fluorometholone (FML) 0.1 % ophthalmic suspension; Place 1 drop into both eyes daily. -     latanoprost (XALATAN) 0.005 % ophthalmic solution; Place 1 drop into both eyes at bedtime. -     timolol (TIMOPTIC) 0.5 % ophthalmic solution; Place 1 drop into the right eye 2 (two) times daily.  Chronic obstructive airway disease with asthma ([facility]) -     CBC -     beclomethasone (QVAR) 80 MCG/ACT inhaler; Inhale 2 puffs into the lungs 2 (two) times daily.   F/u in 2 weeks for BP check with clinical pharmacologist  F/u in 3 -4 months when you return with provider  Dr. Adrian Blackwater

## 2016-11-27 LAB — BMP8+EGFR
BUN/Creatinine Ratio: 14 (ref 10–24)
BUN: 16 mg/dL (ref 8–27)
CO2: 25 mmol/L (ref 20–29)
Calcium: 8.9 mg/dL (ref 8.6–10.2)
Chloride: 103 mmol/L (ref 96–106)
Creatinine, Ser: 1.16 mg/dL (ref 0.76–1.27)
GFR calc Af Amer: 72 mL/min/{1.73_m2} (ref >59)
GFR calc non Af Amer: 62 mL/min/{1.73_m2} (ref >59)
Glucose: 116 mg/dL — ABNORMAL HIGH (ref 65–99)
Potassium: 3.9 mmol/L (ref 3.5–5.2)
Sodium: 143 mmol/L (ref 134–144)

## 2016-11-27 LAB — CBC
Hematocrit: 39.7 % (ref 37.5–51.0)
Hemoglobin: 12.7 g/dL — ABNORMAL LOW (ref 13.0–17.7)
MCH: 27.3 pg (ref 26.6–33.0)
MCHC: 32 g/dL (ref 31.5–35.7)
MCV: 85 fL (ref 79–97)
Platelets: 212 10*3/uL (ref 150–379)
RBC: 4.65 x10E6/uL (ref 4.14–5.80)
RDW: 14.3 % (ref 12.3–15.4)
WBC: 4.9 10*3/uL (ref 3.4–10.8)

## 2016-11-30 ENCOUNTER — Telehealth: Payer: Self-pay

## 2016-11-30 ENCOUNTER — Telehealth: Payer: Self-pay | Admitting: Family Medicine

## 2016-11-30 DIAGNOSIS — I10 Essential (primary) hypertension: Secondary | ICD-10-CM

## 2016-11-30 MED ORDER — LOSARTAN POTASSIUM 100 MG PO TABS: 100.0000 mg | ORAL_TABLET | Freq: Every day | ORAL | 3 refills | Status: DC

## 2016-11-30 NOTE — Telephone Encounter (Signed)
Pt was called and informed of lab results. Pt agrees to get colonoscopy done. Pt also states that BP is still running high.

## 2016-11-30 NOTE — Addendum Note (Signed)
Addended by: Boykin Nearing on: 11/30/2016 01:07 PM   Modules accepted: Orders

## 2016-11-30 NOTE — Telephone Encounter (Signed)
Pt was called and informed of medication change. 

## 2016-11-30 NOTE — Telephone Encounter (Signed)
Please ask patient to increase losartan to 100 mg daily

## 2016-11-30 NOTE — Telephone Encounter (Signed)
[facility] does not accept outside prescription, send prescription for losartan to another pharmacy.   Please advised

## 2016-11-30 NOTE — Telephone Encounter (Signed)
Resent to onsite pharmacy

## 2016-12-04 ENCOUNTER — Other Ambulatory Visit: Payer: Self-pay | Admitting: *Deleted

## 2016-12-04 MED ORDER — BECLOMETHASONE DIPROP HFA 80 MCG/ACT IN AERB
2.0000 | INHALATION_SPRAY | Freq: Two times a day (BID) | RESPIRATORY_TRACT | 3 refills | Status: DC
Start: 2016-12-04 — End: 2017-07-11

## 2016-12-04 NOTE — Telephone Encounter (Signed)
PRINTED FOR PASS PROGRAM 

## 2016-12-10 ENCOUNTER — Other Ambulatory Visit: Payer: Self-pay | Admitting: Family Medicine

## 2016-12-10 DIAGNOSIS — H409 Unspecified glaucoma: Secondary | ICD-10-CM

## 2016-12-10 MED ORDER — PREDNISOLONE ACETATE 1 % OP SUSP: 1.0000 [drp] | Freq: Two times a day (BID) | OPHTHALMIC | 3 refills | Status: DC

## 2016-12-11 ENCOUNTER — Ambulatory Visit: Payer: Self-pay | Attending: Family Medicine | Admitting: Pharmacist

## 2016-12-11 VITALS — BP 138/76 | HR 75

## 2016-12-11 DIAGNOSIS — Z79899 Other long term (current) drug therapy: Secondary | ICD-10-CM | POA: Insufficient documentation

## 2016-12-11 DIAGNOSIS — I10 Essential (primary) hypertension: Secondary | ICD-10-CM | POA: Insufficient documentation

## 2016-12-11 NOTE — Progress Notes (Signed)
   S:    Patient arrives in good spirits with his son.    Presents to the clinic for hypertension evaluation. Patient was referred on 11/26/16.  Patient was last seen by Primary Care Provider on 11/26/16.   Patient reports adherence with medications.  Current BP Medications include:  Losartan 50 mg daily, amlodipine 5 mg daily.   He reports that he is about to leave the country for 3 months.   Home blood pressures 120s-140s/80s (mostlly SBP 130s)  O:   Last 3 Office BP readings: BP Readings from Last 3 Encounters:  12/11/16 138/76  11/26/16 135/78  10/25/16 129/73    BMET    Component Value Date/Time   NA 143 11/26/2016 1151   K 3.9 11/26/2016 1151   CL 103 11/26/2016 1151   CO2 25 11/26/2016 1151   GLUCOSE 116 (H) 11/26/2016 1151   GLUCOSE 100 (H) 06/11/2016 1619   BUN 16 11/26/2016 1151   CREATININE 1.16 11/26/2016 1151   CREATININE 1.04 10/11/2014 0951   CALCIUM 8.9 11/26/2016 1151   GFRNONAA 62 11/26/2016 1151   GFRAA 72 11/26/2016 1151    A/P: Hypertension longstanding currently controlled on current medications.  Continued current medications as prescribed. Seems to have well controlled blood pressure at home so will not increase the losartan at this time. Patient will continue to monitor blood pressure at home and if he notices many readings >140/90, he will return to see me and I will increase the losartan at that time.   Results reviewed and written information provided.   Total time in face-to-face counseling 10 minutes.   F/U Clinic Visit with Dr. Jarold Song after trip.

## 2016-12-11 NOTE — Patient Instructions (Signed)
Thanks for coming to see me!  Continue your current medications  Come back and see me if you see high blood pressures before you leave

## 2016-12-17 ENCOUNTER — Other Ambulatory Visit: Payer: Self-pay

## 2016-12-17 DIAGNOSIS — J449 Chronic obstructive pulmonary disease, unspecified: Secondary | ICD-10-CM

## 2016-12-17 MED ORDER — ALBUTEROL SULFATE (2.5 MG/3ML) 0.083% IN NEBU: INHALATION_SOLUTION | RESPIRATORY_TRACT | 1 refills | Status: DC

## 2016-12-17 MED FILL — $Qvar 80mcg Oral Inhaler: 90 days supply | Qty: 3 | Fill #0

## 2016-12-17 MED FILL — ALBUTEROL 0.083% INHAL SOLN: (2.5 MG/3ML | 90 days supply | Qty: 1080 | Fill #0

## 2016-12-17 MED FILL — raNITIdine HCL 150 MG TABS: 150 | 90 days supply | Qty: 90 | Fill #2

## 2016-12-17 MED FILL — BRIMONIDINE 0.2% EYE DROP: 0.2 | 100 days supply | Qty: 15 | Fill #0

## 2016-12-17 MED FILL — DORZOLAMIDE HCL 2% EYE DRP: 2 | 100 days supply | Qty: 20 | Fill #0

## 2016-12-17 MED FILL — AMLODIPINE BESYLATE 5 MG TA: 5 | 90 days supply | Qty: 90 | Fill #0

## 2016-12-17 MED FILL — TAMSULOSIN HCL 0.4 MG CAP: 0.4 | 90 days supply | Qty: 90 | Fill #2

## 2016-12-17 MED FILL — TIMOLOL 0.5% EYE DROPS: 0.5 | 90 days supply | Qty: 15 | Fill #0

## 2016-12-17 MED FILL — LATANOPROST 0.005% EYE DRP: 0.005 | 90 days supply | Qty: 8 | Fill #0

## 2016-12-17 MED FILL — FLUOROMETHOLONE 0.1% DROPS: 0.1 | 90 days supply | Qty: 10 | Fill #0

## 2016-12-17 MED FILL — LOSARTAN POTASSIUM 100 MG T: 100 | 90 days supply | Qty: 90 | Fill #0

## 2016-12-18 ENCOUNTER — Telehealth: Payer: Self-pay | Admitting: Family Medicine

## 2016-12-18 DIAGNOSIS — J449 Chronic obstructive pulmonary disease, unspecified: Secondary | ICD-10-CM

## 2016-12-18 MED ORDER — ALBUTEROL SULFATE HFA 108 (90 BASE) MCG/ACT IN AERS: INHALATION_SPRAY | RESPIRATORY_TRACT | 2 refills | Status: DC

## 2016-12-18 NOTE — Telephone Encounter (Signed)
Albuterol sent to [facility] pharmacy

## 2016-12-18 NOTE — Telephone Encounter (Signed)
Pt is no longer a pt there and would like to transfer script for albuterol to [facility] pharmacy. Pharmacy in [place] will not honor the script.

## 2016-12-19 MED FILL — $PROAIR HFA 90 MCG INHALER: 90 MCG | 90 days supply | Qty: 3 | Fill #0

## 2017-02-26 ENCOUNTER — Ambulatory Visit: Payer: Self-pay | Admitting: Family Medicine

## 2017-03-11 MED FILL — BRIMONIDINE 0.2% EYE DROP: 0.2 | 75 days supply | Qty: 15 | Fill #1

## 2017-03-11 MED FILL — ALBUTEROL 0.083% INHAL SOLN: (2.5 MG/3ML | 60 days supply | Qty: 720 | Fill #1

## 2017-03-11 MED FILL — FLUOROMETHOLONE 0.1% DROPS: 0.1 | 60 days supply | Qty: 10 | Fill #1

## 2017-03-11 MED FILL — LATANOPROST 0.005% EYE DRP: 0.005 | 60 days supply | Qty: 8 | Fill #1

## 2017-03-11 MED FILL — DORZOLAMIDE HCL 2% EYE DRP: 2 | 50 days supply | Qty: 10 | Fill #1

## 2017-03-11 MED FILL — TIMOLOL 0.5% EYE DROPS: 0.5 | 90 days supply | Qty: 15 | Fill #1

## 2017-03-11 MED FILL — raNITIdine HCL 150 MG TABS: 150 | 30 days supply | Qty: 30 | Fill #3

## 2017-03-11 MED FILL — $Qvar 80mcg Oral Inhaler: 30 days supply | Qty: 1 | Fill #1

## 2017-03-11 MED FILL — LOSARTAN POTASSIUM 100 MG T: 100 | 60 days supply | Qty: 60 | Fill #1

## 2017-03-11 MED FILL — TAMSULOSIN HCL 0.4 MG CAP: 0.4 | 60 days supply | Qty: 60 | Fill #3

## 2017-03-11 MED FILL — AMLODIPINE BESYLATE 5 MG TA: 5 | 60 days supply | Qty: 60 | Fill #1

## 2017-06-25 MED FILL — acetaZOLAMIDE 250 MG TABS: 250 | 7 days supply | Qty: 30 | Fill #0

## 2017-07-04 ENCOUNTER — Encounter (HOSPITAL_COMMUNITY): Payer: Self-pay | Admitting: Nurse Practitioner

## 2017-07-04 ENCOUNTER — Emergency Department (HOSPITAL_COMMUNITY): Payer: Self-pay

## 2017-07-04 ENCOUNTER — Emergency Department (HOSPITAL_COMMUNITY)
Admission: EM | Admit: 2017-07-04 | Discharge: 2017-07-05 | Disposition: A | Discharge status: Home / Self Care | Payer: Self-pay | Attending: Emergency Medicine | Admitting: Emergency Medicine

## 2017-07-04 DIAGNOSIS — I10 Essential (primary) hypertension: Secondary | ICD-10-CM | POA: Insufficient documentation

## 2017-07-04 DIAGNOSIS — R911 Solitary pulmonary nodule: Secondary | ICD-10-CM | POA: Insufficient documentation

## 2017-07-04 DIAGNOSIS — J45901 Unspecified asthma with (acute) exacerbation: Secondary | ICD-10-CM | POA: Insufficient documentation

## 2017-07-04 DIAGNOSIS — Z79899 Other long term (current) drug therapy: Secondary | ICD-10-CM | POA: Insufficient documentation

## 2017-07-04 DIAGNOSIS — Z87891 Personal history of nicotine dependence: Secondary | ICD-10-CM | POA: Insufficient documentation

## 2017-07-04 LAB — CBC
HCT: 39.3 % (ref 39.0–52.0)
Hemoglobin: 12.9 g/dL — ABNORMAL LOW (ref 13.0–17.0)
MCH: 28 pg (ref 26.0–34.0)
MCHC: 32.8 g/dL (ref 30.0–36.0)
MCV: 85.4 fL (ref 78.0–100.0)
Platelets: 222 10*3/uL (ref 150–400)
RBC: 4.6 MIL/uL (ref 4.22–5.81)
RDW: 13.5 % (ref 11.5–15.5)
WBC: 4.3 10*3/uL (ref 4.0–10.5)

## 2017-07-04 LAB — I-STAT TROPONIN, ED
Troponin i, poc: 0 ng/mL (ref 0.00–0.08)
Troponin i, poc: 0 ng/mL (ref 0.00–0.08)

## 2017-07-04 LAB — BASIC METABOLIC PANEL
Anion gap: 8 (ref 5–15)
BUN: 17 mg/dL (ref 6–20)
CO2: 18 mmol/L — ABNORMAL LOW (ref 22–32)
Calcium: 8.5 mg/dL — ABNORMAL LOW (ref 8.9–10.3)
Chloride: 112 mmol/L — ABNORMAL HIGH (ref 101–111)
Creatinine, Ser: 1.2 mg/dL (ref 0.61–1.24)
GFR calc Af Amer: >60 mL/min (ref >60)
GFR calc non Af Amer: 58 mL/min — ABNORMAL LOW (ref >60)
Glucose, Bld: 96 mg/dL (ref 65–99)
Potassium: 3.2 mmol/L — ABNORMAL LOW (ref 3.5–5.1)
Sodium: 138 mmol/L (ref 135–145)

## 2017-07-04 LAB — D-DIMER, QUANTITATIVE: D-Dimer, Quant: 1.16 ug/mL-FEU — ABNORMAL HIGH (ref 0.00–0.50)

## 2017-07-04 MED ORDER — IOPAMIDOL (ISOVUE-370) INJECTION 76%
INTRAVENOUS | Status: AC
  Administered 2017-07-04: 100 mL
  Filled 2017-07-04: qty 100

## 2017-07-04 MED ORDER — ALBUTEROL SULFATE (2.5 MG/3ML) 0.083% IN NEBU
5.0000 mg | INHALATION_SOLUTION | Freq: Once | RESPIRATORY_TRACT | Status: AC
  Administered 2017-07-04: 5 mg via RESPIRATORY_TRACT
  Filled 2017-07-04: qty 6

## 2017-07-04 MED ORDER — SODIUM CHLORIDE 0.9 % IV BOLUS (SEPSIS)
500.0000 mL | Freq: Once | INTRAVENOUS | Status: AC
  Administered 2017-07-04: 500 mL via INTRAVENOUS

## 2017-07-04 MED ORDER — IPRATROPIUM BROMIDE 0.02 % IN SOLN
0.5000 mg | Freq: Once | RESPIRATORY_TRACT | Status: AC
  Administered 2017-07-04: 0.5 mg via RESPIRATORY_TRACT
  Filled 2017-07-04: qty 2.5

## 2017-07-04 MED FILL — ?TAMSULOSIN HCL 0.4 MG CAP: 0.4 | 30 days supply | Qty: 30 | Fill #4

## 2017-07-04 MED FILL — LOSARTAN POTASSIUM 100 MG T: 100 | 30 days supply | Qty: 30 | Fill #2

## 2017-07-04 MED FILL — acetaZOLAMIDE 250 MG TABS: 250 | 7 days supply | Qty: 30 | Fill #1

## 2017-07-04 MED FILL — BRIMONIDINE 0.2% EYE DROP: 0.2 | 75 days supply | Qty: 15 | Fill #2

## 2017-07-04 MED FILL — ?AMLODIPINE BESYLATE 5 MG T: 5 MG | 30 days supply | Qty: 30 | Fill #2

## 2017-07-04 MED FILL — TIMOLOL 0.5% EYE DROPS: 0.5 | 90 days supply | Qty: 15 | Fill #2

## 2017-07-04 MED FILL — DORZOLAMIDE HCL 2% EYE DRP: 2 | 50 days supply | Qty: 10 | Fill #2

## 2017-07-04 MED FILL — LATANOPROST 0.005% EYE DRP: 0.005 | 60 days supply | Qty: 8 | Fill #2

## 2017-07-04 NOTE — ED Provider Notes (Signed)
[facility] EMERGENCY DEPARTMENT Provider Note   CSN: [id number] Arrival date & time: 07/04/17  1549     History   Chief Complaint Chief Complaint  Patient presents with  . Chest Pain    HPI Bryan Sullivan is a 72 y.o. male.  Patient with history of asthma, hypertension, COPD --presents to the emergency department with increasing shortness of breath which he attributes to asthma over the past 4 days or so.  Patient lives in [place] but recently was in [place] until approximately 1/25.  No lower extremity swelling or calf tenderness.  No history of blood clots.  Patient denies any chest pain, fever.  He does have a nonproductive cough.  No URI symptoms.  No nausea, vomiting, abdominal pain or diarrhea.  No urinary symptoms.  Patient has been using home albuterol which helps somewhat for period of time.  Onset of symptoms acute.  Course is constant.  Nothing makes symptoms worse.      Past Medical History:  Diagnosis Date  . Asthma    As child   . Enlarged prostate Dx 2001  . Hypertension    Dx at age 79    Patient Active Problem List   Diagnosis Date Noted  . Gastroesophageal reflux disease 10/08/2016  . Glaucoma (increased eye pressure) 12/16/2014  . Lumbar back pain 12/16/2014  . BPH (benign prostatic hyperplasia) 11/24/2014  . DJD (degenerative joint disease) of knee 11/24/2014  . Chronic obstructive airway disease with asthma ([place]) 10/11/2014  . Hypertension 09/22/2014    Past Surgical History:  Procedure Laterality Date  . CATARACT EXTRACTION W/ INTRAOCULAR LENS  IMPLANT, BILATERAL Bilateral   . CORNEAL TRANSPLANT Right   . EYE SURGERY         Home Medications    Prior to Admission medications   Medication Sig Start Date End Date Taking? Authorizing Provider  albuterol (PROAIR HFA) 108 (90 Base) MCG/ACT inhaler INHALE 2 PUFFS INTO THE LUNGS EVERY 2 HOURS AS NEEDED FOR WHEEZING, SHORTNESS OF BREATH, OR COUGH. 12/18/16  Yes  Jegede, Olugbemiga E, MD  albuterol (PROVENTIL) (2.5 MG/3ML) 0.083% nebulizer solution USE 3 MLS (2.5 MG TOTAL) BY NEBULIZATION EVERY 6 (SIX) HOURS AS NEEDED FOR WHEEZING OR SHORTNESS OF BREATH. 12/17/16  Yes Funches, Josalyn, MD  amLODipine (NORVASC) 5 MG tablet Take 1 tablet (5 mg total) by mouth daily. 11/26/16  Yes Funches, Josalyn, MD  beclomethasone (QVAR REDIHALER) 80 MCG/ACT inhaler Inhale 2 puffs into the lungs 2 (two) times daily. 12/04/16  Yes Tresa Garter, MD  beclomethasone (QVAR) 80 MCG/ACT inhaler Inhale 2 puffs into the lungs 2 (two) times daily. 11/26/16  Yes Funches, Josalyn, MD  brimonidine (ALPHAGAN) 0.2 % ophthalmic solution Place 1 drop into the right eye 3 (three) times daily. 11/26/16 11/26/17 Yes Funches, Josalyn, MD  Cholecalciferol (VITAMIN D-1000 MAX ST) 1000 units tablet Take 1,000 Units by mouth daily. 07/23/16  Yes [provider]  dorzolamide (TRUSOPT) 2 % ophthalmic solution Place 1 drop into the right eye 3 (three) times daily. 11/26/16  Yes Funches, Josalyn, MD  finasteride (PROSCAR) 5 MG tablet Take 5 mg by mouth daily. 07/19/16  Yes [provider]  fluorometholone (FML) 0.1 % ophthalmic suspension Place 1 drop into both eyes daily. 11/26/16  Yes Funches, Josalyn, MD  fluticasone (FLONASE) 50 MCG/ACT nasal spray Place 2 sprays into both nostrils daily. 07/26/16  Yes McClung, Angela M, PA-C  latanoprost (XALATAN) 0.005 % ophthalmic solution Place 1 drop into both eyes at  bedtime. 11/26/16  Yes Funches, Josalyn, MD  losartan (COZAAR) 100 MG tablet Take 1 tablet (100 mg total) by mouth daily. 11/30/16  Yes Funches, Josalyn, MD  ranitidine (ZANTAC) 150 MG tablet Take 1 tablet (150 mg total) by mouth at bedtime. 10/04/16  Yes Funches, Josalyn, MD  tamsulosin (FLOMAX) 0.4 MG CAPS capsule Take 1 capsule (0.4 mg total) by mouth daily. 09/06/16  Yes Funches, Josalyn, MD  timolol (TIMOPTIC) 0.5 % ophthalmic solution Place 1 drop into the right eye 2 (two) times daily.  11/26/16  Yes Funches, Josalyn, MD  acetaZOLAMIDE (DIAMOX) 250 MG tablet Take 500 mg by mouth 2 (two) times daily. 06/24/17   [provider]  prednisoLONE acetate (PRED FORTE) 1 % ophthalmic suspension Place 1 drop into the right eye 2 (two) times daily. Patient not taking: Reported on 07/04/2017 12/10/16   Boykin Nearing, MD    Family History Family History  Problem Relation Age of Onset  . Diabetes Mother   . Asthma Father   . Asthma Sister     Social History Social History   Tobacco Use  . Smoking status: Former Smoker    Types: Cigarettes    Last attempt to quit: 09/21/1968    Years since quitting: 48.8  . Smokeless tobacco: Never Used  . Tobacco comment: "quit smoking cigarettes in 1970"  Substance Use Topics  . Alcohol use: No  . Drug use: No     Allergies   Patient has no known allergies.   Review of Systems Review of Systems  Constitutional: Negative for diaphoresis and fever.  HENT: Negative for rhinorrhea and sore throat.   Eyes: Negative for redness.  Respiratory: Positive for cough and shortness of breath. Negative for chest tightness.   Cardiovascular: Negative for chest pain, palpitations and leg swelling.  Gastrointestinal: Negative for abdominal pain, diarrhea, nausea and vomiting.  Genitourinary: Negative for dysuria.  Musculoskeletal: Negative for back pain, myalgias and neck pain.  Skin: Negative for rash.  Neurological: Negative for syncope, light-headedness and headaches.  Psychiatric/Behavioral: The patient is not nervous/anxious.      Physical Exam Updated Vital Signs BP 131/72   Pulse 69   Temp 97.7 F (36.5 C) (Oral)   Resp 14   Ht 5' 9.5" (1.765 m)   Wt 82 kg (180 lb 12.4 oz)   SpO2 100%   BMI 26.31 kg/m   Physical Exam  Constitutional: He appears well-developed and well-nourished.  HENT:  Head: Normocephalic and atraumatic.  Mouth/Throat: Mucous membranes are normal. Mucous membranes are not dry.  Eyes: Conjunctivae  are normal.  Neck: Trachea normal and normal range of motion. Neck supple. Normal carotid pulses and no JVD present. No muscular tenderness present. Carotid bruit is not present. No tracheal deviation present.  Cardiovascular: Normal rate, regular rhythm, S1 normal, S2 normal, normal heart sounds and intact distal pulses. Exam reveals no distant heart sounds and no decreased pulses.  No murmur heard. Pulmonary/Chest: Effort normal. No respiratory distress. He has decreased breath sounds (generally). He has no wheezes. He has no rhonchi. He has no rales. He exhibits no tenderness.  Abdominal: Soft. Normal aorta and bowel sounds are normal. There is no tenderness. There is no rebound and no guarding.  Musculoskeletal: He exhibits no edema.       Right lower leg: He exhibits no tenderness and no edema.       Left lower leg: He exhibits no tenderness and no edema.  Neurological: He is alert.  Skin: Skin is  warm and dry. He is not diaphoretic. No cyanosis. No pallor.  Psychiatric: He has a normal mood and affect.  Nursing note and vitals reviewed.    ED Treatments / Results  Labs (all labs ordered are listed, but only abnormal results are displayed) Labs Reviewed  BASIC METABOLIC PANEL - Abnormal; Notable for the following components:      Result Value   Potassium 3.2 (*)    Chloride 112 (*)    CO2 18 (*)    Calcium 8.5 (*)    GFR calc non Af Amer 58 (*)    All other components within normal limits  CBC - Abnormal; Notable for the following components:   Hemoglobin 12.9 (*)    All other components within normal limits  D-DIMER, QUANTITATIVE (NOT AT [facility]) - Abnormal; Notable for the following components:   D-Dimer, Quant 1.16 (*)    All other components within normal limits  I-STAT TROPONIN, ED  I-STAT TROPONIN, ED    EKG  EKG Interpretation  Date/Time:  Thursday July 04 2017 15:58:49 EST Ventricular Rate:  82 PR Interval:  188 QRS Duration: 102 QT Interval:  368 QTC  Calculation: 429 R Axis:   -49 Text Interpretation:  Sinus rhythm with occasional Premature ventricular complexes Left axis deviation Incomplete right bundle branch block Abnormal ECG No significant change since last tracing Confirmed by Addison Lank [phone]) on 07/04/2017 10:12:32 PM       Radiology Dg Chest 2 View  Result Date: 07/04/2017 CLINICAL DATA:  Chest pain and shortness of breath for 4 days. EXAM: CHEST  2 VIEW COMPARISON:  09/28/2016 FINDINGS: Cardiomediastinal silhouette is normal. Mediastinal contours appear intact. Stable eventration of the left hemidiaphragm. There is no evidence of pleural effusion or pneumothorax. Increased subpleural interstitial markings in the right lower hemithorax. Osseous structures are without acute abnormality. Soft tissues are grossly normal. IMPRESSION: Subtle increased subpleural interstitial markings in the right lower hemithorax. This may represent chronic interstitial lung changes, subtle consolidation or potentially a pulmonary nodule. If further imaging evaluation is desired, CT of the chest without contrast may be considered. Electronically Signed   By: Fidela Salisbury M.D.   On: 07/04/2017 17:10   Ct Angio Chest Pe W And/or Wo Contrast  Result Date: 07/05/2017 CLINICAL DATA:  Right-sided chest pain with cough and shortness of breath EXAM: CT ANGIOGRAPHY CHEST WITH CONTRAST TECHNIQUE: Multidetector CT imaging of the chest was performed using the standard protocol during bolus administration of intravenous contrast. Multiplanar CT image reconstructions and MIPs were obtained to evaluate the vascular anatomy. CONTRAST:  181mL ISOVUE-370 IOPAMIDOL (ISOVUE-370) INJECTION 76% COMPARISON:  Chest x-ray 07/04/2017, CT abdomen pelvis 06/22/2015 FINDINGS: Cardiovascular: Satisfactory opacification of the pulmonary arteries to the segmental level. No evidence of pulmonary embolism. Nonaneurysmal aorta. Mild atherosclerotic calcification. Mild coronary artery  calcification. Normal heart size. No pericardial effusion Mediastinum/Nodes: Multiple slightly enlarged mediastinal lymph nodes. Left low paratracheal lymph node measures 11 mm. Precarinal lymph node measures 11 mm. 12 mm left hilar lymph node. 12 mm right hilar lymph node. Esophagus within normal limits. Midline trachea. Lungs/Pleura: No pleural effusion or pneumothorax. Mild nodularity is present within the apical portion of the right upper lobe. Nodular density measuring 7 mm average diameter, series 6, image number 33. no nodule or infiltrate to correspond to question area on the radiograph. Upper Abdomen: No acute abnormality. Musculoskeletal: No chest wall abnormality. No acute or significant osseous findings. Review of the MIP images confirms the above findings. IMPRESSION: 1. Negative for  acute pulmonary embolus. 2. Mild nodularity in the apical portion of the right upper lobe, possibly due to mild respiratory infection. There is a 7 mm nodule in this region. Non-contrast chest CT at 6-12 months is recommended. If the nodule is stable at time of repeat CT, then future CT at 18-24 months (from today's scan) is considered optional for low-risk patients, but is recommended for high-risk patients. This recommendation follows the consensus statement: Guidelines for Management of Incidental Pulmonary Nodules Detected on CT Images: From the Fleischner Society 2017; Radiology 2017; 284:228-243. 3. Mild mediastinal and hilar adenopathy, possibly reactive but could be re-evaluated at CT follow-up. Aortic Atherosclerosis (ICD10-I70.0). Electronically Signed   By: Donavan Foil M.D.   On: 07/05/2017 00:09    Procedures Procedures (including critical care time)  Medications Ordered in ED Medications  albuterol (PROVENTIL) (2.5 MG/3ML) 0.083% nebulizer solution 5 mg (not administered)  ipratropium (ATROVENT) nebulizer solution 0.5 mg (not administered)  methylPREDNISolone sodium succinate (SOLU-MEDROL) 125 mg/2  mL injection 125 mg (not administered)  azithromycin (ZITHROMAX) tablet 500 mg (not administered)  albuterol (PROVENTIL) (2.5 MG/3ML) 0.083% nebulizer solution 5 mg (5 mg Nebulization Given 07/04/17 2154)  ipratropium (ATROVENT) nebulizer solution 0.5 mg (0.5 mg Nebulization Given 07/04/17 2154)  sodium chloride 0.9 % bolus 500 mL (0 mLs Intravenous Stopped 07/04/17 2318)  iopamidol (ISOVUE-370) 76 % injection (100 mLs  Contrast Given 07/04/17 2325)     Initial Impression / Assessment and Plan / ED Course  I have reviewed the triage vital signs and the nursing notes.  Pertinent labs & imaging results that were available during my care of the patient were reviewed by me and considered in my medical decision making (see chart for details).     Patient seen and examined.  D-dimer added given recent plane flight from [place].  Will check delta troponin.  Will give albuterol treatment and reassess.  Given unclear consolidation on chest x-ray, if workup reassuring, will likely discharge home on antibiotics.  Vital signs reviewed and are as follows: BP 131/72   Pulse 69   Temp 97.7 F (36.5 C) (Oral)   Resp 14   Ht 5' 9.5" (1.765 m)   Wt 82 kg (180 lb 12.4 oz)   SpO2 100%   BMI 26.31 kg/m   10:40 PM patient with better air movement after breathing treatment and now with moderate wheezing.  D-dimer is elevated.  Will need to rule out PE with CT angiography.  Exam is more clinically suggestive of asthma exacerbation currently.  12:48 AM patient and family updated on CT results.  Plan is to give another breathing treatment, azithromycin, site Medrol IV.  Once this is complete patient may be discharged.  He is feeling well.  Breathing without any distress or accessory muscle use.  Encouraged to f/u with [facility] regarding possible nodule for reimaging in 6-12 mo.   Patient urged to return with worsening symptoms or other concerns. Patient verbalized understanding and agrees with plan.    Handoff to  [facility] who will d/c when treatment completed.   Final Clinical Impressions(s) / ED Diagnoses   Final diagnoses:  Exacerbation of asthma, unspecified asthma severity, unspecified whether persistent  Pulmonary nodule   Patient with shortness of breath likely due to asthma exacerbation.  Given minimal wheezing on arrival and recent plane ride from [place], patient screened for PE with d-dimer.  This was elevated.  No definite embolism noted on CT.  Possible nodule versus mild respiratory infection.  Patient will  be covered with prednisone and azithromycin.  Breathing improved subjectively and clinically while in the emergency department with albuterol and Atrovent.  Feel safe for discharged home at this time.  No tachypnea, accessory muscle use, hypoxia.  Normal heart rate with mildly elevated blood pressure.  Remainder of workup reassuring.  ED Discharge Orders        Ordered    predniSONE (DELTASONE) 20 MG tablet  Daily     07/05/17 0050    azithromycin (ZITHROMAX) 250 MG tablet  Daily     07/05/17 0050       Carlisle Cater, PA-C 07/05/17 [phone]

## 2017-07-04 NOTE — ED Triage Notes (Signed)
Patient presents with 4 days of right sided chest pain described as tightness with associated with cough and shob. Pt sts feels his asthma is exacerbated. Pt recently came from [place] on 1/25. Pt does not smoke, ambulatory to triage room does not appear in acute distress. Strong dry cough noted. Pt denies fevers or chills, facial pain, nasal congestion or drainage or sore throat.

## 2017-07-04 NOTE — ED Provider Notes (Signed)
Medical screening examination/treatment/procedure(s) were conducted as a shared visit with non-physician practitioner(s) and myself.  I personally evaluated the patient during the encounter. Briefly, the patient is a 72 y.o. male who presents with 4 days of SOB. No chest pain or recent infections.  Recent travel to [place]. CT negative for PE. Treated with multiple breathing treatments for Asthma exacerbation. The patient is safe for discharge with strict return precautions.    EKG Interpretation  Date/Time:  Thursday July 04 2017 15:58:49 EST Ventricular Rate:  82 PR Interval:  188 QRS Duration: 102 QT Interval:  368 QTC Calculation: 429 R Axis:   -49 Text Interpretation:  Sinus rhythm with occasional Premature ventricular complexes Left axis deviation Incomplete right bundle branch block Abnormal ECG No significant change since last tracing Confirmed by Addison Lank [phone]) on 07/04/2017 10:12:32 PM        CRITICAL CARE Performed by: Grayce Sessions Laretha Luepke Total critical care time: 30 minutes Critical care time was exclusive of separately billable procedures and treating other patients. Critical care was necessary to treat or prevent imminent or life-threatening deterioration. Critical care was time spent personally by me on the following activities: development of treatment plan with patient and/or surrogate as well as nursing, discussions with consultants, evaluation of patient's response to treatment, examination of patient, obtaining history from patient or surrogate, ordering and performing treatments and interventions, ordering and review of laboratory studies, ordering and review of radiographic studies, pulse oximetry and re-evaluation of patient's condition.     Fatima Blank, MD 07/06/17 [phone]

## 2017-07-04 NOTE — ED Notes (Signed)
Being transported to CT. 

## 2017-07-04 NOTE — ED Notes (Signed)
Patient transported to CT 

## 2017-07-04 NOTE — ED Notes (Signed)
Pt states that nebulizer treatment has helped with his SOB. Denies CP at this time.

## 2017-07-05 MED ORDER — AZITHROMYCIN 250 MG PO TABS: 250.0000 mg | ORAL_TABLET | Freq: Every day | ORAL | 0 refills | Status: DC

## 2017-07-05 MED ORDER — ALBUTEROL SULFATE (2.5 MG/3ML) 0.083% IN NEBU
5.0000 mg | INHALATION_SOLUTION | Freq: Once | RESPIRATORY_TRACT | Status: AC
  Administered 2017-07-05: 5 mg via RESPIRATORY_TRACT
  Filled 2017-07-05: qty 6

## 2017-07-05 MED ORDER — METHYLPREDNISOLONE SODIUM SUCC 125 MG IJ SOLR
125.0000 mg | Freq: Once | INTRAMUSCULAR | Status: AC
  Administered 2017-07-05: 125 mg via INTRAVENOUS
  Filled 2017-07-05: qty 2

## 2017-07-05 MED ORDER — AZITHROMYCIN 250 MG PO TABS
500.0000 mg | ORAL_TABLET | Freq: Once | ORAL | Status: AC
  Administered 2017-07-05: 500 mg via ORAL
  Filled 2017-07-05: qty 2

## 2017-07-05 MED ORDER — BENZONATATE 100 MG PO CAPS: 200.0000 mg | ORAL_CAPSULE | Freq: Two times a day (BID) | ORAL | 0 refills | Status: DC | PRN

## 2017-07-05 MED ORDER — PREDNISONE 20 MG PO TABS: 40.0000 mg | ORAL_TABLET | Freq: Every day | ORAL | 0 refills | Status: DC

## 2017-07-05 MED ORDER — ALBUTEROL SULFATE HFA 108 (90 BASE) MCG/ACT IN AERS
2.0000 | INHALATION_SPRAY | RESPIRATORY_TRACT | Status: DC | PRN
  Administered 2017-07-05: 2 via RESPIRATORY_TRACT
  Filled 2017-07-05: qty 6.7

## 2017-07-05 MED ORDER — IPRATROPIUM BROMIDE 0.02 % IN SOLN
0.5000 mg | Freq: Once | RESPIRATORY_TRACT | Status: AC
  Administered 2017-07-05: 0.5 mg via RESPIRATORY_TRACT
  Filled 2017-07-05: qty 2.5

## 2017-07-05 MED FILL — BENZONATATE 100 MG CAPSULE: 100 | 5 days supply | Qty: 20 | Fill #0

## 2017-07-05 MED FILL — predniSONE 20 MG TABS: 20 | 8 days supply | Qty: 8 | Fill #0

## 2017-07-05 MED FILL — AZITHROMYCIN 250 MG TABLET: 250 | 4 days supply | Qty: 4 | Fill #0

## 2017-07-05 NOTE — ED Provider Notes (Signed)
1:40 AM Reassessed. Lungs have only minor expiratory wheezes in left upper. Feels significantly improved.  Stable for DC with outpatient f/u.   Montine Circle, PA-C 07/05/17 [phone]

## 2017-07-05 NOTE — Discharge Instructions (Signed)
Please read and follow all provided instructions.  Your diagnoses today include:  1. Exacerbation of asthma, unspecified asthma severity, unspecified whether persistent   2. Pulmonary nodule     Tests performed today include:  Chest x-ray and CT scan of chest -- no blood clots, possible infection/nodule. This should be rechecked by your primary care doctor  Blood counts and electrolytes  Vital signs. See below for your results today.   Medications prescribed:   Prednisone - steroid medicine   It is best to take this medication in the morning to prevent sleeping problems. If you are diabetic, monitor your blood sugar closely and stop taking Prednisone if blood sugar is over 300. Take with food to prevent stomach upset.    Azithromycin - antibiotic for respiratory infection  You have been prescribed an antibiotic medicine: take the entire course of medicine even if you are feeling better. Stopping early can cause the antibiotic not to work.  Take any prescribed medications only as directed.  Home care instructions:  Follow any educational materials contained in this packet.  BE VERY CAREFUL not to take multiple medicines containing Tylenol (also called acetaminophen). Doing so can lead to an overdose which can damage your liver and cause liver failure and possibly death.   Follow-up instructions: Please follow-up with your primary care provider in the next 5 days for further evaluation of your symptoms.   Return instructions:   Please return to the Emergency Department if you experience worsening symptoms.  Return with chest pain, fever, worsening shortness of breath or trouble breathing.  Please return if you have any other emergent concerns.  Additional Information:  Your vital signs today were: BP (!) 144/64    Pulse 62    Temp 97.7 F (36.5 C) (Oral)    Resp 11    Ht 5' 9.5" (1.765 m)    Wt 82 kg (180 lb 12.4 oz)    SpO2 100%    BMI 26.31 kg/m  If your blood  pressure (BP) was elevated above 135/85 this visit, please have this repeated by your doctor within one month. --------------

## 2017-07-11 ENCOUNTER — Encounter: Payer: Self-pay | Admitting: Nurse Practitioner

## 2017-07-11 ENCOUNTER — Ambulatory Visit: Payer: Self-pay | Attending: Nurse Practitioner | Admitting: Nurse Practitioner

## 2017-07-11 VITALS — BP 123/65 | HR 79 | Temp 97.4°F | Ht 70.0 in | Wt 178.4 lb

## 2017-07-11 DIAGNOSIS — R3914 Feeling of incomplete bladder emptying: Secondary | ICD-10-CM | POA: Insufficient documentation

## 2017-07-11 DIAGNOSIS — J449 Chronic obstructive pulmonary disease, unspecified: Secondary | ICD-10-CM | POA: Insufficient documentation

## 2017-07-11 DIAGNOSIS — H409 Unspecified glaucoma: Secondary | ICD-10-CM | POA: Insufficient documentation

## 2017-07-11 DIAGNOSIS — Z833 Family history of diabetes mellitus: Secondary | ICD-10-CM | POA: Insufficient documentation

## 2017-07-11 DIAGNOSIS — Z825 Family history of asthma and other chronic lower respiratory diseases: Secondary | ICD-10-CM | POA: Insufficient documentation

## 2017-07-11 DIAGNOSIS — Z9842 Cataract extraction status, left eye: Secondary | ICD-10-CM | POA: Insufficient documentation

## 2017-07-11 DIAGNOSIS — Z961 Presence of intraocular lens: Secondary | ICD-10-CM | POA: Insufficient documentation

## 2017-07-11 DIAGNOSIS — Z9841 Cataract extraction status, right eye: Secondary | ICD-10-CM | POA: Insufficient documentation

## 2017-07-11 DIAGNOSIS — I10 Essential (primary) hypertension: Secondary | ICD-10-CM | POA: Insufficient documentation

## 2017-07-11 DIAGNOSIS — N401 Enlarged prostate with lower urinary tract symptoms: Secondary | ICD-10-CM | POA: Insufficient documentation

## 2017-07-11 DIAGNOSIS — Z79899 Other long term (current) drug therapy: Secondary | ICD-10-CM | POA: Insufficient documentation

## 2017-07-11 DIAGNOSIS — Z7951 Long term (current) use of inhaled steroids: Secondary | ICD-10-CM | POA: Insufficient documentation

## 2017-07-11 DIAGNOSIS — E876 Hypokalemia: Secondary | ICD-10-CM | POA: Insufficient documentation

## 2017-07-11 MED ORDER — LOSARTAN POTASSIUM 100 MG PO TABS: 100.0000 mg | ORAL_TABLET | Freq: Every day | ORAL | 3 refills | Status: DC

## 2017-07-11 MED ORDER — FINASTERIDE 5 MG PO TABS: 5.0000 mg | ORAL_TABLET | Freq: Every day | ORAL | 1 refills | Status: DC

## 2017-07-11 MED ORDER — TAMSULOSIN HCL 0.4 MG PO CAPS: 0.4000 mg | ORAL_CAPSULE | Freq: Every day | ORAL | 11 refills | Status: DC

## 2017-07-11 MED FILL — FINASTERIDE 5 MG TABLET: 5 | 30 days supply | Qty: 30 | Fill #0

## 2017-07-11 NOTE — Patient Instructions (Addendum)
Hypokalemia Hypokalemia means that the amount of potassium in the blood is lower than normal.Potassium is a chemical that helps regulate the amount of fluid in the body (electrolyte). It also stimulates muscle tightening (contraction) and helps nerves work properly.Normally, most of the body's potassium is inside of cells, and only a very small amount is in the blood. Because the amount in the blood is so small, minor changes to potassium levels in the blood can be life-threatening. What are the causes? This condition may be caused by:  Antibiotic medicine.  Diarrhea or vomiting. Taking too much of a medicine that helps you have a bowel movement (laxative) can cause diarrhea and lead to hypokalemia.  Chronic kidney disease (CKD).  Medicines that help the body get rid of excess fluid (diuretics).  Eating disorders, such as bulimia.  Low magnesium levels in the body.  Sweating a lot.  What are the signs or symptoms? Symptoms of this condition include:  Weakness.  Constipation.  Fatigue.  Muscle cramps.  Mental confusion.  Skipped heartbeats or irregular heartbeat (palpitations).  Tingling or numbness.  How is this diagnosed? This condition is diagnosed with a blood test. How is this treated? Hypokalemia can be treated by taking potassium supplements by mouth or adjusting the medicines that you take. Treatment may also include eating more foods that contain a lot of potassium. If your potassium level is very low, you may need to get potassium through an IV tube in one of your veins and be monitored in the hospital. Follow these instructions at home:  Take over-the-counter and prescription medicines only as told by your health care provider. This includes vitamins and supplements.  Eat a healthy diet. A healthy diet includes fresh fruits and vegetables, whole grains, healthy fats, and lean proteins.  If instructed, eat more foods that contain a lot of potassium, such  as: ? Nuts, such as peanuts and pistachios. ? Seeds, such as sunflower seeds and pumpkin seeds. ? Peas, lentils, and lima beans. ? Whole grain and bran cereals and breads. ? Fresh fruits and vegetables, such as apricots, avocado, bananas, cantaloupe, kiwi, oranges, tomatoes, asparagus, and potatoes. ? Orange juice. ? Tomato juice. ? Red meats. ? Yogurt.  Keep all follow-up visits as told by your health care provider. This is important. Contact a health care provider if:  You have weakness that gets worse.  You feel your heart pounding or racing.  You vomit.  You have diarrhea.  You have diabetes (diabetes mellitus) and you have trouble keeping your blood sugar (glucose) in your target range. Get help right away if:  You have chest pain.  You have shortness of breath.  You have vomiting or diarrhea that lasts for more than 2 days.  You faint. This information is not intended to replace advice given to you by your health care provider. Make sure you discuss any questions you have with your health care provider. Document Released: 05/14/2005 Document Revised: 12/31/2015 Document Reviewed: 12/31/2015 Elsevier Interactive Patient Education  2018 [employer].  Hypokalemia Hypokalemia means that the amount of potassium in the blood is lower than normal.Potassium is a chemical that helps regulate the amount of fluid in the body (electrolyte). It also stimulates muscle tightening (contraction) and helps nerves work properly.Normally, most of the body's potassium is inside of cells, and only a very small amount is in the blood. Because the amount in the blood is so small, minor changes to potassium levels in the blood can be life-threatening. What are  the causes? This condition may be caused by:  Antibiotic medicine.  Diarrhea or vomiting. Taking too much of a medicine that helps you have a bowel movement (laxative) can cause diarrhea and lead to hypokalemia.  Chronic kidney  disease (CKD).  Medicines that help the body get rid of excess fluid (diuretics).  Eating disorders, such as bulimia.  Low magnesium levels in the body.  Sweating a lot.  What are the signs or symptoms? Symptoms of this condition include:  Weakness.  Constipation.  Fatigue.  Muscle cramps.  Mental confusion.  Skipped heartbeats or irregular heartbeat (palpitations).  Tingling or numbness.  How is this diagnosed? This condition is diagnosed with a blood test. How is this treated? Hypokalemia can be treated by taking potassium supplements by mouth or adjusting the medicines that you take. Treatment may also include eating more foods that contain a lot of potassium. If your potassium level is very low, you may need to get potassium through an IV tube in one of your veins and be monitored in the hospital. Follow these instructions at home:  Take over-the-counter and prescription medicines only as told by your health care provider. This includes vitamins and supplements.  Eat a healthy diet. A healthy diet includes fresh fruits and vegetables, whole grains, healthy fats, and lean proteins.  If instructed, eat more foods that contain a lot of potassium, such as: ? Nuts, such as peanuts and pistachios. ? Seeds, such as sunflower seeds and pumpkin seeds. ? Peas, lentils, and lima beans. ? Whole grain and bran cereals and breads. ? Fresh fruits and vegetables, such as apricots, avocado, bananas, cantaloupe, kiwi, oranges, tomatoes, asparagus, and potatoes. ? Orange juice. ? Tomato juice. ? Red meats. ? Yogurt.  Keep all follow-up visits as told by your health care provider. This is important. Contact a health care provider if:  You have weakness that gets worse.  You feel your heart pounding or racing.  You vomit.  You have diarrhea.  You have diabetes (diabetes mellitus) and you have trouble keeping your blood sugar (glucose) in your target range. Get help right  away if:  You have chest pain.  You have shortness of breath.  You have vomiting or diarrhea that lasts for more than 2 days.  You faint. This information is not intended to replace advice given to you by your health care provider. Make sure you discuss any questions you have with your health care provider. Document Released: 05/14/2005 Document Revised: 12/31/2015 Document Reviewed: 12/31/2015 Elsevier Interactive Patient Education  2018 [employer].

## 2017-07-11 NOTE — Progress Notes (Signed)
Assessment & Plan:  Taris was seen today for establish care.  Diagnoses and all orders for this visit:  Chronic obstructive airway disease with asthma ([place])  Benign prostatic hyperplasia with incomplete bladder emptying -     finasteride (PROSCAR) 5 MG tablet; Take 1 tablet (5 mg total) by mouth daily. -     tamsulosin (FLOMAX) 0.4 MG CAPS capsule; Take 1 capsule (0.4 mg total) by mouth daily. -     PSA  Essential hypertension -     losartan (COZAAR) 100 MG tablet; Take 1 tablet (100 mg total) by mouth daily.  Hypokalemia -     Potassium    Patient has been counseled on age-appropriate routine health concerns for screening and prevention. These are reviewed and up-to-date. Referrals have been placed accordingly. Immunizations are up-to-date or declined.    Subjective:   Chief Complaint  Patient presents with  . Establish Care    Patient is here to establish care for Asthma and get his prostate check.    HPI Bryan Sullivan 72 y.o. male presents to office today to establish care. He was treated in the ED  on 07-04-2017 with asthma exacerbation. Prior to his ED admit he had traveled to his home country of [place] and had recently returned as of 06-21-2017. His CT was negative for PE. He was treated to asthma exacerbation with breathing treatments and discharged home on Pro Air prn, Proventil nebs, and QVAR. He has a history of childhood asthma, BPH, Glaucoma, DJD of knee and HTN. His nephew is here interpreting for him today at his request.   Essential Hypertension Chronic. Stable. He takes amlodipine 5mg  and Cozaar 100mg  as prescribed. Denies chest pain, shortness of breath, palpitations, lightheadedness, dizziness, headaches or BLE edema.  BP Readings from Last 3 Encounters:  07/11/17 123/65  07/05/17 131/74  12/11/16 138/76    Glaucoma  He sees an ophtamologist at [facility] for glaucoma. Last appointment was last month. Next appointment in March.   BPH He endorses  frequency and urinary retention. He was taking proscar and flomax but unfortunately he has inadvertently forgot to refill his proscar for a few months. Will restart proscar today.    Asthma Follow-up  He has previously been evaluated here for asthma and presents for an asthma follow-up; he is not currently in exacerbation. Symptoms currently include NONE. Observed precipitants include cold air, smoke and upper respiratory infection.  Current limitations in activity from asthma: none.  Number of days of school or work missed in the last month: not applicable. Number of Emergency Department visits in the previous month: 1. Frequency of use of quick-relief meds: <2 times per week. The patient reports adherence to this regimen   Review of Systems  Constitutional: Negative for fever, malaise/fatigue and weight loss.  HENT: Negative.  Negative for nosebleeds.   Eyes: Negative.  Negative for blurred vision, double vision and photophobia.  Respiratory: Positive for cough (chronic). Negative for shortness of breath.   Cardiovascular: Negative.  Negative for chest pain, palpitations and leg swelling.  Gastrointestinal: Negative.  Negative for abdominal pain, constipation, diarrhea, heartburn, nausea and vomiting.  Genitourinary: Positive for frequency and urgency. Negative for dysuria, flank pain and hematuria.       SEE HPI  Musculoskeletal: Negative.  Negative for myalgias.  Neurological: Negative.  Negative for dizziness, focal weakness, seizures and headaches.  Endo/Heme/Allergies: Negative for environmental allergies.  Psychiatric/Behavioral: Negative.  Negative for suicidal ideas.    Past Medical History:  Diagnosis Date  .  Asthma    As child   . Enlarged prostate Dx 2001  . Hypertension    Dx at age 13    Past Surgical History:  Procedure Laterality Date  . CATARACT EXTRACTION W/ INTRAOCULAR LENS  IMPLANT, BILATERAL Bilateral   . CORNEAL TRANSPLANT Right   . EYE SURGERY      Family  History  Problem Relation Age of Onset  . Diabetes Mother   . Asthma Father   . Asthma Sister     Social History Reviewed with no changes to be made today.   Outpatient Medications Prior to Visit  Medication Sig Dispense Refill  . acetaZOLAMIDE (DIAMOX) 250 MG tablet Take 500 mg by mouth 2 (two) times daily.    Marland Kitchen albuterol (PROAIR HFA) 108 (90 Base) MCG/ACT inhaler INHALE 2 PUFFS INTO THE LUNGS EVERY 2 HOURS AS NEEDED FOR WHEEZING, SHORTNESS OF BREATH, OR COUGH. 8.5 g 2  . albuterol (PROVENTIL) (2.5 MG/3ML) 0.083% nebulizer solution USE 3 MLS (2.5 MG TOTAL) BY NEBULIZATION EVERY 6 (SIX) HOURS AS NEEDED FOR WHEEZING OR SHORTNESS OF BREATH. 1080 mL 1  . amLODipine (NORVASC) 5 MG tablet Take 1 tablet (5 mg total) by mouth daily. 90 tablet 3  . beclomethasone (QVAR) 80 MCG/ACT inhaler Inhale 2 puffs into the lungs 2 (two) times daily. 3 Inhaler 3  . brimonidine (ALPHAGAN) 0.2 % ophthalmic solution Place 1 drop into the right eye 3 (three) times daily. 15 mL 3  . Cholecalciferol (VITAMIN D-1000 MAX ST) 1000 units tablet Take 1,000 Units by mouth daily.    . dorzolamide (TRUSOPT) 2 % ophthalmic solution Place 1 drop into the right eye 3 (three) times daily. 30 mL 3  . fluorometholone (FML) 0.1 % ophthalmic suspension Place 1 drop into both eyes daily. 15 mL 3  . fluticasone (FLONASE) 50 MCG/ACT nasal spray Place 2 sprays into both nostrils daily. 16 g 2  . predniSONE (DELTASONE) 20 MG tablet Take 2 tablets (40 mg total) by mouth daily. 8 tablet 0  . ranitidine (ZANTAC) 150 MG tablet Take 1 tablet (150 mg total) by mouth at bedtime. 30 tablet 5  . timolol (TIMOPTIC) 0.5 % ophthalmic solution Place 1 drop into the right eye 2 (two) times daily. 15 mL 3  . beclomethasone (QVAR REDIHALER) 80 MCG/ACT inhaler Inhale 2 puffs into the lungs 2 (two) times daily. 3 Inhaler 3  . losartan (COZAAR) 100 MG tablet Take 1 tablet (100 mg total) by mouth daily. 90 tablet 3  . tamsulosin (FLOMAX) 0.4 MG CAPS  capsule Take 1 capsule (0.4 mg total) by mouth daily. 30 capsule 11  . benzonatate (TESSALON) 100 MG capsule Take 2 capsules (200 mg total) by mouth 2 (two) times daily as needed for cough. (Patient not taking: Reported on 07/11/2017) 20 capsule 0  . latanoprost (XALATAN) 0.005 % ophthalmic solution Place 1 drop into both eyes at bedtime. (Patient not taking: Reported on 07/11/2017) 7.5 mL 3  . azithromycin (ZITHROMAX) 250 MG tablet Take 1 tablet (250 mg total) by mouth daily. 4 tablet 0  . finasteride (PROSCAR) 5 MG tablet Take 5 mg by mouth daily.     No facility-administered medications prior to visit.     No Known Allergies     Objective:    BP 123/65 (BP Location: Left Arm, Patient Position: Sitting, Cuff Size: Normal)   Pulse 79   Temp (!) 97.4 F (36.3 C) (Oral)   Ht 5\' 10"  (1.778 m)   Wt 178 lb  6.4 oz (80.9 kg)   SpO2 100%   BMI 25.60 kg/m  Wt Readings from Last 3 Encounters:  07/11/17 178 lb 6.4 oz (80.9 kg)  07/04/17 180 lb 12.4 oz (82 kg)  11/26/16 188 lb (85.3 kg)    Physical Exam  Constitutional: He is oriented to person, place, and time. He appears well-developed and well-nourished. He is cooperative.  HENT:  Head: Normocephalic and atraumatic.  Eyes: EOM are normal.  Neck: Normal range of motion.  Cardiovascular: Normal rate, regular rhythm, normal heart sounds and intact distal pulses. Exam reveals no gallop and no friction rub.  No murmur heard. Pulmonary/Chest: Effort normal. No tachypnea. No respiratory distress. He has no decreased breath sounds. He has no wheezes. He has rhonchi (clears when patient instructed to cough.). He has no rales. He exhibits no tenderness.  Abdominal: Soft. Bowel sounds are normal.  Musculoskeletal: Normal range of motion. He exhibits no edema.  Neurological: He is alert and oriented to person, place, and time. Coordination normal.  Skin: Skin is warm and dry.  Psychiatric: He has a normal mood and affect. His behavior is normal.  Judgment and thought content normal.  Nursing note and vitals reviewed.      Patient has been counseled extensively about nutrition and exercise as well as the importance of adherence with medications and regular follow-up. The patient was given clear instructions to go to ER or return to medical center if symptoms don't improve, worsen or new problems develop. The patient verbalized understanding.   Follow-up: Return for Needs appointment with financial representative.. Needs physical.  Gildardo Pounds, FNP-BC [facility] [place], [place]   07/11/2017, 12:35 PM

## 2017-07-12 LAB — POTASSIUM: Potassium: 3.7 mmol/L (ref 3.5–5.2)

## 2017-07-12 LAB — PSA: Prostate Specific Ag, Serum: 4.3 ng/mL — ABNORMAL HIGH (ref 0.0–4.0)

## 2017-07-15 ENCOUNTER — Telehealth: Payer: Self-pay

## 2017-07-15 NOTE — Telephone Encounter (Signed)
CMA spoke to Lawana Pai is emergency contact on lab results and PCP advising.    EC will relay the message to patient.

## 2017-07-15 NOTE — Telephone Encounter (Signed)
-----   Message from Gildardo Pounds, NP sent at 07/13/2017  1:59 PM EST ----- Your PSA range for your age is 0-6.5 your PSA is currently 4.3 which is normal for your age. Your potassium is also normal. Continue proscar and flomax as prescribed.

## 2017-07-24 ENCOUNTER — Ambulatory Visit: Payer: Self-pay | Admitting: Critical Care Medicine

## 2017-07-26 ENCOUNTER — Ambulatory Visit: Payer: Self-pay | Admitting: Nurse Practitioner

## 2017-08-06 ENCOUNTER — Encounter: Payer: Self-pay | Admitting: Nurse Practitioner

## 2017-08-06 MED FILL — LOSARTAN POTASSIUM 100 MG T: 100 | 30 days supply | Qty: 30 | Fill #3

## 2017-08-07 ENCOUNTER — Encounter: Payer: Self-pay | Admitting: Nurse Practitioner

## 2017-08-07 ENCOUNTER — Ambulatory Visit: Payer: Self-pay | Attending: Nurse Practitioner | Admitting: Nurse Practitioner

## 2017-08-07 VITALS — BP 133/77 | HR 71 | Temp 98.0°F | Ht 70.0 in | Wt 180.0 lb

## 2017-08-07 DIAGNOSIS — Z0000 Encounter for general adult medical examination without abnormal findings: Secondary | ICD-10-CM

## 2017-08-07 DIAGNOSIS — Z947 Corneal transplant status: Secondary | ICD-10-CM | POA: Insufficient documentation

## 2017-08-07 DIAGNOSIS — N400 Enlarged prostate without lower urinary tract symptoms: Secondary | ICD-10-CM | POA: Insufficient documentation

## 2017-08-07 DIAGNOSIS — K210 Gastro-esophageal reflux disease with esophagitis: Secondary | ICD-10-CM | POA: Insufficient documentation

## 2017-08-07 DIAGNOSIS — I10 Essential (primary) hypertension: Secondary | ICD-10-CM | POA: Insufficient documentation

## 2017-08-07 DIAGNOSIS — Z1211 Encounter for screening for malignant neoplasm of colon: Secondary | ICD-10-CM

## 2017-08-07 DIAGNOSIS — Z79899 Other long term (current) drug therapy: Secondary | ICD-10-CM | POA: Insufficient documentation

## 2017-08-07 DIAGNOSIS — Z9841 Cataract extraction status, right eye: Secondary | ICD-10-CM | POA: Insufficient documentation

## 2017-08-07 DIAGNOSIS — Z0001 Encounter for general adult medical examination with abnormal findings: Secondary | ICD-10-CM | POA: Insufficient documentation

## 2017-08-07 DIAGNOSIS — Z825 Family history of asthma and other chronic lower respiratory diseases: Secondary | ICD-10-CM | POA: Insufficient documentation

## 2017-08-07 DIAGNOSIS — K219 Gastro-esophageal reflux disease without esophagitis: Secondary | ICD-10-CM

## 2017-08-07 DIAGNOSIS — Z7952 Long term (current) use of systemic steroids: Secondary | ICD-10-CM | POA: Insufficient documentation

## 2017-08-07 DIAGNOSIS — J449 Chronic obstructive pulmonary disease, unspecified: Secondary | ICD-10-CM | POA: Insufficient documentation

## 2017-08-07 DIAGNOSIS — Z833 Family history of diabetes mellitus: Secondary | ICD-10-CM | POA: Insufficient documentation

## 2017-08-07 DIAGNOSIS — H409 Unspecified glaucoma: Secondary | ICD-10-CM | POA: Insufficient documentation

## 2017-08-07 MED ORDER — TIZANIDINE HCL 2 MG PO CAPS: 2.0000 mg | ORAL_CAPSULE | Freq: Three times a day (TID) | ORAL | 0 refills | Status: AC

## 2017-08-07 MED ORDER — BECLOMETHASONE DIPROPIONATE 80 MCG/ACT IN AERS: 2.0000 | INHALATION_SPRAY | Freq: Two times a day (BID) | RESPIRATORY_TRACT | 3 refills | Status: DC

## 2017-08-07 MED ORDER — AMLODIPINE BESYLATE 5 MG PO TABS: 5.0000 mg | ORAL_TABLET | Freq: Every day | ORAL | 3 refills | Status: DC

## 2017-08-07 MED ORDER — RANITIDINE HCL 150 MG PO TABS: 150.0000 mg | ORAL_TABLET | Freq: Two times a day (BID) | ORAL | 5 refills | Status: DC

## 2017-08-07 MED FILL — FINASTERIDE 5 MG TABLET: 5 | 30 days supply | Qty: 30 | Fill #1

## 2017-08-07 MED FILL — AMLODIPINE BESYLATE 5 MG TA: 5 | 30 days supply | Qty: 30 | Fill #0

## 2017-08-07 MED FILL — raNITIdine HCL 150 MG TABS: 150 | 30 days supply | Qty: 60 | Fill #0

## 2017-08-07 MED FILL — $Qvar 80mcg Oral Inhaler: 30 days supply | Qty: 1 | Fill #0

## 2017-08-07 MED FILL — tiZANidine HCL 2 MG TABS: 2 | 20 days supply | Qty: 60 | Fill #0

## 2017-08-07 NOTE — Patient Instructions (Addendum)
Abdominal Pain, Adult Many things can cause belly (abdominal) pain. Most times, belly pain is not dangerous. Many cases of belly pain can be watched and treated at home. Sometimes belly pain is serious, though. Your doctor will try to find the cause of your belly pain. Follow these instructions at home:  Take over-the-counter and prescription medicines only as told by your doctor. Do not take medicines that help you poop (laxatives) unless told to by your doctor.  Drink enough fluid to keep your pee (urine) clear or pale yellow.  Watch your belly pain for any changes.  Keep all follow-up visits as told by your doctor. This is important. Contact a doctor if:  Your belly pain changes or gets worse.  You are not hungry, or you lose weight without trying.  You are having trouble pooping (constipated) or have watery poop (diarrhea) for more than 2-3 days.  You have pain when you pee or poop.  Your belly pain wakes you up at night.  Your pain gets worse with meals, after eating, or with certain foods.  You are throwing up and cannot keep anything down.  You have a fever. Get help right away if:  Your pain does not go away as soon as your doctor says it should.  You cannot stop throwing up.  Your pain is only in areas of your belly, such as the right side or the left lower part of the belly.  You have bloody or black poop, or poop that looks like tar.  You have very bad pain, cramping, or bloating in your belly.  You have signs of not having enough fluid or water in your body (dehydration), such as: ? Dark pee, very little pee, or no pee. ? Cracked lips. ? Dry mouth. ? Sunken eyes. ? Sleepiness. ? Weakness. This information is not intended to replace advice given to you by your health care provider. Make sure you discuss any questions you have with your health care provider. Document Released: 10/31/2007 Document Revised: 12/02/2015 Document Reviewed: 10/26/2015 Elsevier  Interactive Patient Education  2018 [employer].  Gastroesophageal Reflux Disease, Adult Normally, food travels down the esophagus and stays in the stomach to be digested. If a person has gastroesophageal reflux disease (GERD), food and stomach acid move back up into the esophagus. When this happens, the esophagus becomes sore and swollen (inflamed). Over time, GERD can make small holes (ulcers) in the lining of the esophagus. Follow these instructions at home: Diet  Follow a diet as told by your doctor. You may need to avoid foods and drinks such as: ? Coffee and tea (with or without caffeine). ? Drinks that contain alcohol. ? Energy drinks and sports drinks. ? Carbonated drinks or sodas. ? Chocolate and cocoa. ? Peppermint and mint flavorings. ? Garlic and onions. ? Horseradish. ? Spicy and acidic foods, such as peppers, chili powder, curry powder, vinegar, hot sauces, and BBQ sauce. ? Citrus fruit juices and citrus fruits, such as oranges, lemons, and limes. ? Tomato-based foods, such as red sauce, chili, salsa, and pizza with red sauce. ? Fried and fatty foods, such as donuts, french fries, potato chips, and high-fat dressings. ? High-fat meats, such as hot dogs, rib eye steak, sausage, ham, and bacon. ? High-fat dairy items, such as whole milk, butter, and cream cheese.  Eat small meals often. Avoid eating large meals.  Avoid drinking large amounts of liquid with your meals.  Avoid eating meals during the 2-3 hours before bedtime.  Avoid lying down right after you eat.  Do not exercise right after you eat. General instructions  Pay attention to any changes in your symptoms.  Take over-the-counter and prescription medicines only as told by your doctor. Do not take aspirin, ibuprofen, or other NSAIDs unless your doctor says it is okay.  Do not use any tobacco products, including cigarettes, chewing tobacco, and e-cigarettes. If you need help quitting, ask your  doctor.  Wear loose clothes. Do not wear anything tight around your waist.  Raise (elevate) the head of your bed about 6 inches (15 cm).  Try to lower your stress. If you need help doing this, ask your doctor.  If you are overweight, lose an amount of weight that is healthy for you. Ask your doctor about a safe weight loss goal.  Keep all follow-up visits as told by your doctor. This is important. Contact a doctor if:  You have new symptoms.  You lose weight and you do not know why it is happening.  You have trouble swallowing, or it hurts to swallow.  You have wheezing or a cough that keeps happening.  Your symptoms do not get better with treatment.  You have a hoarse voice. Get help right away if:  You have pain in your arms, neck, jaw, teeth, or back.  You feel sweaty, dizzy, or light-headed.  You have chest pain or shortness of breath.  You throw up (vomit) and your throw up looks like blood or coffee grounds.  You pass out (faint).  Your poop (stool) is bloody or black.  You cannot swallow, drink, or eat. This information is not intended to replace advice given to you by your health care provider. Make sure you discuss any questions you have with your health care provider. Document Released: 10/31/2007 Document Revised: 10/20/2015 Document Reviewed: 09/08/2014 Elsevier Interactive Patient Education  [employer].

## 2017-08-07 NOTE — Progress Notes (Signed)
Assessment & Plan:  Bryan Sullivan was seen today for annual exam and gas.  Diagnoses and all orders for this visit:  Encounter for annual physical exam  Chronic obstructive airway disease with asthma (HCC) -     beclomethasone (QVAR) 80 MCG/ACT inhaler; Inhale 2 puffs into the lungs 2 (two) times daily.  Essential hypertension -     amLODipine (NORVASC) 5 MG tablet; Take 1 tablet (5 mg total) by mouth daily. Continue all antihypertensives as prescribed.  Remember to bring in your blood pressure log with you for your follow up appointment.  DASH/Mediterranean Diets are healthier choices for HTN.    Gastroesophageal reflux disease, esophagitis presence not specified -     ranitidine (ZANTAC) 150 MG tablet; Take 1 tablet (150 mg total) by mouth 2 (two) times daily. INSTRUCTIONS: Avoid GERD Triggers: acidic, spicy or fried foods, caffeine, coffee, sodas,  alcohol and chocolate.   Other orders -     tizanidine (ZANAFLEX) 2 MG capsule; Take 1 capsule (2 mg total) by mouth 3 (three) times daily. To be used for bilateral shoulder pain.     Patient has been counseled on age-appropriate routine health concerns for screening and prevention. These are reviewed and up-to-date. Referrals have been placed accordingly. Immunizations are up-to-date or declined.    Subjective:   Chief Complaint  Patient presents with  . Annual Exam    here for a physical  . Gas    Patient stated he sometimes feel gas in his abdominal and take Antacid OTC that helps.    HPI Bryan Sullivan 72 y.o. male presents to office today for annual physical exam. His last eye exam was 07-30-2017. His next appointment is in 6 months. He has glaucoma of both eyes. His son is here today to translate.   Abodminal Pain He was prescribed ranitidine in the past for "gas" symptoms. Today he reports he has not taken the ranitidine. Continue to endorse "gas" in stomach radiating to epigastric area and right chest.  Describes  pain as crampy. Pain is minimally relieved with OTC antacids. He denies fever, constipation, diarrhea, nausea or vomiting.    Review of Systems  Constitutional: Negative for fever, malaise/fatigue and weight loss.  HENT: Negative.  Negative for ear discharge, ear pain, hearing loss, nosebleeds, sore throat and tinnitus.   Eyes: Positive for blurred vision (right eye). Negative for double vision, photophobia, pain, discharge and redness.  Respiratory: Negative.  Negative for cough, shortness of breath and wheezing.   Cardiovascular: Negative.  Negative for chest pain (epigastric), palpitations and leg swelling.  Gastrointestinal: Positive for abdominal pain and heartburn. Negative for blood in stool, constipation, diarrhea, melena, nausea and vomiting.  Genitourinary: Positive for frequency. Negative for dysuria, flank pain, hematuria and urgency.  Musculoskeletal: Positive for myalgias and neck pain.  Skin: Negative.   Neurological: Negative.  Negative for dizziness, focal weakness, seizures and headaches.  Endo/Heme/Allergies: Negative.   Psychiatric/Behavioral: Negative.  Negative for suicidal ideas.    Past Medical History:  Diagnosis Date  . Asthma    As child   . Enlarged prostate Dx 2001  . Hypertension    Dx at age 68    Past Surgical History:  Procedure Laterality Date  . CATARACT EXTRACTION W/ INTRAOCULAR LENS  IMPLANT, BILATERAL Bilateral   . CORNEAL TRANSPLANT Right   . EYE SURGERY      Family History  Problem Relation Age of Onset  . Diabetes Mother   . Asthma Father   . Asthma  Sister     Social History Reviewed with no changes to be made today.   Outpatient Medications Prior to Visit  Medication Sig Dispense Refill  . albuterol (PROAIR HFA) 108 (90 Base) MCG/ACT inhaler INHALE 2 PUFFS INTO THE LUNGS EVERY 2 HOURS AS NEEDED FOR WHEEZING, SHORTNESS OF BREATH, OR COUGH. 8.5 g 2  . albuterol (PROVENTIL) (2.5 MG/3ML) 0.083% nebulizer solution USE 3 MLS (2.5 MG  TOTAL) BY NEBULIZATION EVERY 6 (SIX) HOURS AS NEEDED FOR WHEEZING OR SHORTNESS OF BREATH. 1080 mL 1  . brimonidine (ALPHAGAN) 0.2 % ophthalmic solution Place 1 drop into the right eye 3 (three) times daily. 15 mL 3  . dorzolamide (TRUSOPT) 2 % ophthalmic solution Place 1 drop into the right eye 3 (three) times daily. 30 mL 3  . finasteride (PROSCAR) 5 MG tablet Take 1 tablet (5 mg total) by mouth daily. 90 tablet 1  . fluorometholone (FML) 0.1 % ophthalmic suspension Place 1 drop into both eyes daily. 15 mL 3  . fluticasone (FLONASE) 50 MCG/ACT nasal spray Place 2 sprays into both nostrils daily. 16 g 2  . losartan (COZAAR) 100 MG tablet Take 1 tablet (100 mg total) by mouth daily. 90 tablet 3  . predniSONE (DELTASONE) 20 MG tablet Take 2 tablets (40 mg total) by mouth daily. 8 tablet 0  . tamsulosin (FLOMAX) 0.4 MG CAPS capsule Take 1 capsule (0.4 mg total) by mouth daily. 30 capsule 11  . timolol (TIMOPTIC) 0.5 % ophthalmic solution Place 1 drop into the right eye 2 (two) times daily. 15 mL 3  . amLODipine (NORVASC) 5 MG tablet Take 1 tablet (5 mg total) by mouth daily. 90 tablet 3  . beclomethasone (QVAR) 80 MCG/ACT inhaler Inhale 2 puffs into the lungs 2 (two) times daily. 3 Inhaler 3  . acetaZOLAMIDE (DIAMOX) 250 MG tablet Take 500 mg by mouth 2 (two) times daily.    . benzonatate (TESSALON) 100 MG capsule Take 2 capsules (200 mg total) by mouth 2 (two) times daily as needed for cough. (Patient not taking: Reported on 07/11/2017) 20 capsule 0  . Cholecalciferol (VITAMIN D-1000 MAX ST) 1000 units tablet Take 1,000 Units by mouth daily.    Marland Kitchen latanoprost (XALATAN) 0.005 % ophthalmic solution Place 1 drop into both eyes at bedtime. (Patient not taking: Reported on 07/11/2017) 7.5 mL 3  . ranitidine (ZANTAC) 150 MG tablet Take 1 tablet (150 mg total) by mouth at bedtime. (Patient not taking: Reported on 08/07/2017) 30 tablet 5   No facility-administered medications prior to visit.     No Known  Allergies     Objective:    BP 133/77 (BP Location: Left Arm, Patient Position: Sitting, Cuff Size: Normal)   Pulse 71   Temp 98 F (36.7 C) (Oral)   Ht 5\' 10"  (1.778 m)   Wt 180 lb (81.6 kg)   SpO2 100%   BMI 25.83 kg/m  Wt Readings from Last 3 Encounters:  08/07/17 180 lb (81.6 kg)  07/11/17 178 lb 6.4 oz (80.9 kg)  07/04/17 180 lb 12.4 oz (82 kg)    Physical Exam  Constitutional: He is oriented to person, place, and time. He appears well-developed and well-nourished.  HENT:  Head: Normocephalic and atraumatic.  Right Ear: Hearing and external ear normal.  Left Ear: Hearing and external ear normal.  Nose: Mucosal edema present.  Mouth/Throat: Uvula is midline, oropharynx is clear and moist and mucous membranes are normal.  Increased cerumen production in right ear  Eyes:  Conjunctivae and lids are normal. No scleral icterus.  Neck: Normal range of motion. Neck supple. No tracheal deviation present. No thyromegaly present.  Cardiovascular: Normal rate, regular rhythm, normal heart sounds and intact distal pulses. Exam reveals no gallop and no friction rub.  No murmur heard. Pulmonary/Chest: Effort normal and breath sounds normal. No respiratory distress. He has no wheezes. He has no rales. He exhibits no mass and no tenderness. Right breast exhibits no inverted nipple, no mass, no nipple discharge, no skin change and no tenderness. Left breast exhibits no inverted nipple, no mass, no nipple discharge, no skin change and no tenderness.  Abdominal: Soft. Bowel sounds are normal. He exhibits no distension and no mass. There is no tenderness. There is no rebound and no guarding. Hernia confirmed negative in the right inguinal area and confirmed negative in the left inguinal area.  Genitourinary: Penis normal. No penile tenderness.  Musculoskeletal: Normal range of motion. He exhibits no edema, tenderness or deformity.  There is moderate pain elicited with firm pressure applied to  bilateral trapezius   Lymphadenopathy:    He has no cervical adenopathy.       Right: No inguinal adenopathy present.       Left: No inguinal adenopathy present.  Neurological: He is alert and oriented to person, place, and time. He has normal strength. He displays normal reflexes. No cranial nerve deficit or sensory deficit. He exhibits normal muscle tone. He displays a negative Romberg sign. Coordination and gait normal.  Skin: Skin is warm, dry and intact. No erythema.  Psychiatric: He has a normal mood and affect. His speech is normal and behavior is normal. Judgment and thought content normal. Cognition and memory are normal.       Patient has been counseled extensively about nutrition and exercise as well as the importance of adherence with medications and regular follow-up. The patient was given clear instructions to go to ER or return to medical center if symptoms don't improve, worsen or new problems develop. The patient verbalized understanding.   Follow-up: Return in about 3 weeks (around 08/28/2017) for 3 week f/u for GERD/, Needs appointment with financial representative.Gildardo Pounds, FNP-BC [facility] and [place], [place]   08/07/2017, 1:32 PM

## 2017-08-21 MED FILL — DORZOLAMIDE HCL 2% EYE DRP: 2 | 50 days supply | Qty: 10 | Fill #3

## 2017-09-04 MED FILL — TAMSULOSIN HCL 0.4 MG CAP: 0.4 | 30 days supply | Qty: 30 | Fill #5

## 2017-09-04 MED FILL — FINASTERIDE 5 MG TABLET: 5 | 30 days supply | Qty: 30 | Fill #2

## 2017-09-04 MED FILL — AMLODIPINE BESYLATE 5 MG TA: 5 | 30 days supply | Qty: 30 | Fill #1

## 2017-09-04 MED FILL — LOSARTAN POTASSIUM 100 MG T: 100 | 30 days supply | Qty: 30 | Fill #4

## 2017-09-05 ENCOUNTER — Ambulatory Visit: Payer: Self-pay | Admitting: Nurse Practitioner

## 2017-09-06 MED FILL — LATANOPROST 0.005% EYE DRP: 0.005 | 60 days supply | Qty: 8 | Fill #3

## 2017-09-18 MED FILL — FLUOROMETHOLONE 0.1% DROPS: 0.1 | 37 days supply | Qty: 5 | Fill #0

## 2017-09-18 MED FILL — LATANOPROST 0.005% EYE DRP: 0.005 | 18 days supply | Qty: 3 | Fill #0

## 2017-09-25 ENCOUNTER — Ambulatory Visit: Payer: Self-pay | Attending: Nurse Practitioner | Admitting: Nurse Practitioner

## 2017-09-25 ENCOUNTER — Encounter: Payer: Self-pay | Admitting: Nurse Practitioner

## 2017-09-25 VITALS — BP 123/66 | HR 80 | Temp 98.2°F | Resp 12 | Ht 70.0 in | Wt 181.0 lb

## 2017-09-25 DIAGNOSIS — Z9842 Cataract extraction status, left eye: Secondary | ICD-10-CM | POA: Insufficient documentation

## 2017-09-25 DIAGNOSIS — Z833 Family history of diabetes mellitus: Secondary | ICD-10-CM | POA: Insufficient documentation

## 2017-09-25 DIAGNOSIS — Z09 Encounter for follow-up examination after completed treatment for conditions other than malignant neoplasm: Secondary | ICD-10-CM | POA: Insufficient documentation

## 2017-09-25 DIAGNOSIS — Z825 Family history of asthma and other chronic lower respiratory diseases: Secondary | ICD-10-CM | POA: Insufficient documentation

## 2017-09-25 DIAGNOSIS — N401 Enlarged prostate with lower urinary tract symptoms: Secondary | ICD-10-CM | POA: Insufficient documentation

## 2017-09-25 DIAGNOSIS — J45909 Unspecified asthma, uncomplicated: Secondary | ICD-10-CM | POA: Insufficient documentation

## 2017-09-25 DIAGNOSIS — H409 Unspecified glaucoma: Secondary | ICD-10-CM

## 2017-09-25 DIAGNOSIS — Z9841 Cataract extraction status, right eye: Secondary | ICD-10-CM | POA: Insufficient documentation

## 2017-09-25 DIAGNOSIS — J449 Chronic obstructive pulmonary disease, unspecified: Secondary | ICD-10-CM | POA: Insufficient documentation

## 2017-09-25 DIAGNOSIS — R3914 Feeling of incomplete bladder emptying: Secondary | ICD-10-CM

## 2017-09-25 DIAGNOSIS — H4089 Other specified glaucoma: Secondary | ICD-10-CM | POA: Insufficient documentation

## 2017-09-25 DIAGNOSIS — Z7952 Long term (current) use of systemic steroids: Secondary | ICD-10-CM | POA: Insufficient documentation

## 2017-09-25 DIAGNOSIS — Z79899 Other long term (current) drug therapy: Secondary | ICD-10-CM | POA: Insufficient documentation

## 2017-09-25 DIAGNOSIS — K219 Gastro-esophageal reflux disease without esophagitis: Secondary | ICD-10-CM | POA: Insufficient documentation

## 2017-09-25 DIAGNOSIS — I10 Essential (primary) hypertension: Secondary | ICD-10-CM | POA: Insufficient documentation

## 2017-09-25 DIAGNOSIS — Z9889 Other specified postprocedural states: Secondary | ICD-10-CM | POA: Insufficient documentation

## 2017-09-25 MED ORDER — FINASTERIDE 5 MG PO TABS: 5.0000 mg | ORAL_TABLET | Freq: Every day | ORAL | 1 refills | Status: DC

## 2017-09-25 MED ORDER — DORZOLAMIDE HCL 2 % OP SOLN: 1.0000 [drp] | Freq: Three times a day (TID) | OPHTHALMIC | 3 refills | Status: DC

## 2017-09-25 MED ORDER — OMEPRAZOLE 20 MG PO CPDR: 20.0000 mg | DELAYED_RELEASE_CAPSULE | Freq: Every day | ORAL | 1 refills | Status: DC

## 2017-09-25 MED ORDER — AMLODIPINE BESYLATE 5 MG PO TABS: 5.0000 mg | ORAL_TABLET | Freq: Every day | ORAL | 3 refills | Status: DC

## 2017-09-25 MED ORDER — LATANOPROST 0.005 % OP SOLN: 1.0000 [drp] | Freq: Every day | OPHTHALMIC | 3 refills | Status: DC

## 2017-09-25 MED ORDER — FLUOROMETHOLONE 0.1 % OP SUSP: 1.0000 [drp] | Freq: Every day | OPHTHALMIC | 3 refills | Status: DC

## 2017-09-25 MED ORDER — FLUTICASONE-SALMETEROL 100-50 MCG/DOSE IN AEPB: 1.0000 | INHALATION_SPRAY | Freq: Two times a day (BID) | RESPIRATORY_TRACT | 3 refills | Status: DC

## 2017-09-25 MED ORDER — BRIMONIDINE TARTRATE 0.2 % OP SOLN: 1.0000 [drp] | Freq: Three times a day (TID) | OPHTHALMIC | 3 refills | Status: DC

## 2017-09-25 MED ORDER — LOSARTAN POTASSIUM 100 MG PO TABS: 100.0000 mg | ORAL_TABLET | Freq: Every day | ORAL | 3 refills | Status: DC

## 2017-09-25 MED ORDER — TIMOLOL MALEATE 0.5 % OP SOLN: 1.0000 [drp] | Freq: Two times a day (BID) | OPHTHALMIC | 3 refills | Status: DC

## 2017-09-25 MED ORDER — TAMSULOSIN HCL 0.4 MG PO CAPS: 0.4000 mg | ORAL_CAPSULE | Freq: Every day | ORAL | 11 refills | Status: DC

## 2017-09-25 MED ORDER — BECLOMETHASONE DIPROPIONATE 80 MCG/ACT IN AERS: 2.0000 | INHALATION_SPRAY | Freq: Two times a day (BID) | RESPIRATORY_TRACT | 3 refills | Status: DC

## 2017-09-25 MED FILL — OMEPRAZOLE 20 MG CAP: 20 | 30 days supply | Qty: 30 | Fill #0

## 2017-09-25 MED FILL — !ADVAIR 100/50 DISKUS: 100-50 | 30 days supply | Qty: 60 | Fill #0

## 2017-09-25 MED FILL — LATANOPROST 0.005% EYE DRP: 0.005 | 37 days supply | Qty: 3 | Fill #0

## 2017-09-25 MED FILL — BRIMONIDINE 0.2% EYE DROP: 0.2 | 25 days supply | Qty: 5 | Fill #0

## 2017-09-25 MED FILL — $Qvar 80mcg Oral Inhaler: 30 days supply | Qty: 1 | Fill #1

## 2017-09-25 NOTE — Progress Notes (Signed)
Assessment & Plan:  Bryan Sullivan was seen today for follow-up.  Diagnoses and all orders for this visit:  Gastroesophageal reflux disease, esophagitis presence not specified -     omeprazole (PRILOSEC) 20 MG capsule; Take 1 capsule (20 mg total) by mouth daily. INSTRUCTIONS: Avoid GERD Triggers: acidic, spicy or fried foods, caffeine, coffee, sodas,  alcohol and chocolate.    Essential hypertension -     amLODipine (NORVASC) 5 MG tablet; Take 1 tablet (5 mg total) by mouth daily. -     losartan (COZAAR) 100 MG tablet; Take 1 tablet (100 mg total) by mouth daily. Continue all antihypertensives as prescribed.  Remember to bring in your blood pressure log with you for your follow up appointment.  DASH/Mediterranean Diets are healthier choices for HTN.    Chronic obstructive airway disease with asthma (HCC) -     beclomethasone (QVAR) 80 MCG/ACT inhaler; Inhale 2 puffs into the lungs 2 (two) times daily. -     Fluticasone-Salmeterol (ADVAIR) 100-50 MCG/DOSE AEPB; Inhale 1 puff into the lungs 2 (two) times daily. Asthma red flags discussed  Benign prostatic hyperplasia with incomplete bladder emptying -     finasteride (PROSCAR) 5 MG tablet; Take 1 tablet (5 mg total) by mouth daily. -     tamsulosin (FLOMAX) 0.4 MG CAPS capsule; Take 1 capsule (0.4 mg total) by mouth daily.  Glaucoma of right eye, unspecified glaucoma type -     dorzolamide (TRUSOPT) 2 % ophthalmic solution; Place 1 drop into the right eye 3 (three) times daily. -     latanoprost (XALATAN) 0.005 % ophthalmic solution; Place 1 drop into both eyes at bedtime. -     timolol (TIMOPTIC) 0.5 % ophthalmic solution; Place 1 drop into the right eye 2 (two) times daily. -     brimonidine (ALPHAGAN) 0.2 % ophthalmic solution; Place 1 drop into the right eye 3 (three) times daily. -     fluorometholone (FML) 0.1 % ophthalmic suspension; Place 1 drop into both eyes daily.    Patient has been counseled on age-appropriate routine  health concerns for screening and prevention. These are reviewed and up-to-date. Referrals have been placed accordingly. Immunizations are up-to-date or declined.    Subjective:   Chief Complaint  Patient presents with  . Follow-up    Pt. is here for a follow-up on gas. Pt. stated his stomach is much better.    HPI Bryan Sullivan 72 y.o. male presents to office today for follow up to GERD.   GERD He was instructed to take zantac 150mg  BID at his last office visit with me on 08-07-2017 due to complaints of increased gas and abdominal pain with radiation of pain into the epigastric area and right chest. Today he is requesting a refill of omeprazole and this has provided significant relief of his symptoms in the past. He tried the zantac and endorses less relief of symptoms with the h2 antagonist.   Essential Hypertension Chronic. Stable. Taking amlodipine 5mg  daily as prescribed. Endorses medication and diet compliance. Denies chest pain, shortness of breath, palpitations, lightheadedness, dizziness, headaches or BLE edema.  BP Readings from Last 3 Encounters:  09/25/17 123/66  08/07/17 133/77  07/11/17 123/65   Asthma Chronic.  He has been using his albuterol inhaler several times per day in addition to his daily use of Qvar. Will add second agent; Advair as he has wheezing on exam today. He does endorse dyspnea with exertion.   Review of Systems  Constitutional: Negative for  fever, malaise/fatigue and weight loss.  HENT: Negative.  Negative for nosebleeds.   Eyes: Positive for blurred vision. Negative for double vision, photophobia, pain, discharge and redness.  Respiratory: Positive for shortness of breath. Negative for cough.   Cardiovascular: Negative.  Negative for chest pain, palpitations, orthopnea, claudication and leg swelling.  Gastrointestinal: Positive for heartburn. Negative for nausea and vomiting.  Genitourinary: Positive for frequency. Negative for dysuria, flank  pain, hematuria and urgency.  Musculoskeletal: Negative.  Negative for myalgias.  Neurological: Negative.  Negative for dizziness, focal weakness, seizures and headaches.  Psychiatric/Behavioral: Negative.  Negative for suicidal ideas.    Past Medical History:  Diagnosis Date  . Asthma    As child   . Enlarged prostate Dx 2001  . Hypertension    Dx at age 66    Past Surgical History:  Procedure Laterality Date  . CATARACT EXTRACTION W/ INTRAOCULAR LENS  IMPLANT, BILATERAL Bilateral   . CORNEAL TRANSPLANT Right   . EYE SURGERY      Family History  Problem Relation Age of Onset  . Diabetes Mother   . Asthma Father   . Asthma Sister     Social History Reviewed with no changes to be made today.   Outpatient Medications Prior to Visit  Medication Sig Dispense Refill  . albuterol (PROAIR HFA) 108 (90 Base) MCG/ACT inhaler INHALE 2 PUFFS INTO THE LUNGS EVERY 2 HOURS AS NEEDED FOR WHEEZING, SHORTNESS OF BREATH, OR COUGH. 8.5 g 2  . albuterol (PROVENTIL) (2.5 MG/3ML) 0.083% nebulizer solution USE 3 MLS (2.5 MG TOTAL) BY NEBULIZATION EVERY 6 (SIX) HOURS AS NEEDED FOR WHEEZING OR SHORTNESS OF BREATH. 1080 mL 1  . amLODipine (NORVASC) 5 MG tablet Take 1 tablet (5 mg total) by mouth daily. 90 tablet 3  . beclomethasone (QVAR) 80 MCG/ACT inhaler Inhale 2 puffs into the lungs 2 (two) times daily. 3 Inhaler 3  . brimonidine (ALPHAGAN) 0.2 % ophthalmic solution Place 1 drop into the right eye 3 (three) times daily. 15 mL 3  . dorzolamide (TRUSOPT) 2 % ophthalmic solution Place 1 drop into the right eye 3 (three) times daily. 30 mL 3  . finasteride (PROSCAR) 5 MG tablet Take 1 tablet (5 mg total) by mouth daily. 90 tablet 1  . fluorometholone (FML) 0.1 % ophthalmic suspension Place 1 drop into both eyes daily. 15 mL 3  . latanoprost (XALATAN) 0.005 % ophthalmic solution Place 1 drop into both eyes at bedtime. 7.5 mL 3  . losartan (COZAAR) 100 MG tablet Take 1 tablet (100 mg total) by mouth  daily. 90 tablet 3  . tamsulosin (FLOMAX) 0.4 MG CAPS capsule Take 1 capsule (0.4 mg total) by mouth daily. 30 capsule 11  . timolol (TIMOPTIC) 0.5 % ophthalmic solution Place 1 drop into the right eye 2 (two) times daily. 15 mL 3  . acetaZOLAMIDE (DIAMOX) 250 MG tablet Take 500 mg by mouth 2 (two) times daily.    . Cholecalciferol (VITAMIN D-1000 MAX ST) 1000 units tablet Take 1,000 Units by mouth daily.    . fluticasone (FLONASE) 50 MCG/ACT nasal spray Place 2 sprays into both nostrils daily. (Patient not taking: Reported on 09/25/2017) 16 g 2  . benzonatate (TESSALON) 100 MG capsule Take 2 capsules (200 mg total) by mouth 2 (two) times daily as needed for cough. (Patient not taking: Reported on 07/11/2017) 20 capsule 0  . predniSONE (DELTASONE) 20 MG tablet Take 2 tablets (40 mg total) by mouth daily. (Patient not taking: Reported on  09/25/2017) 8 tablet 0  . ranitidine (ZANTAC) 150 MG tablet Take 1 tablet (150 mg total) by mouth 2 (two) times daily. 60 tablet 5   No facility-administered medications prior to visit.     No Known Allergies     Objective:    BP 123/66 (BP Location: Left Arm, Patient Position: Sitting, Cuff Size: Normal)   Pulse 80   Temp 98.2 F (36.8 C) (Oral)   Ht 5\' 10"  (1.778 m)   Wt 181 lb (82.1 kg)   SpO2 99%   BMI 25.97 kg/m  Wt Readings from Last 3 Encounters:  09/25/17 181 lb (82.1 kg)  08/07/17 180 lb (81.6 kg)  07/11/17 178 lb 6.4 oz (80.9 kg)    Physical Exam  Constitutional: He is oriented to person, place, and time. He appears well-developed and well-nourished. He is cooperative.  HENT:  Head: Normocephalic and atraumatic.  Right Ear: Decreased hearing is noted.  Left Ear: Decreased hearing is noted.  Eyes: EOM are normal.  Neck: Normal range of motion.  Cardiovascular: Normal rate, regular rhythm and normal heart sounds. Exam reveals no gallop and no friction rub.  No murmur heard. Pulmonary/Chest: Effort normal. No tachypnea. No respiratory  distress. He has no decreased breath sounds. He has wheezes in the right middle field, the right lower field, the left middle field and the left lower field. He has no rhonchi. He has no rales. He exhibits no tenderness.  Abdominal: Soft. Bowel sounds are normal.  Musculoskeletal: Normal range of motion. He exhibits no edema.  Neurological: He is alert and oriented to person, place, and time. Coordination normal.  Skin: Skin is warm and dry.  Psychiatric: He has a normal mood and affect. His behavior is normal. Judgment and thought content normal.  Nursing note and vitals reviewed.      Patient has been counseled extensively about nutrition and exercise as well as the importance of adherence with medications and regular follow-up. The patient was given clear instructions to go to ER or return to medical center if symptoms don't improve, worsen or new problems develop. The patient verbalized understanding.   Follow-up: Return in about 3 months (around 12/26/2017).   Gildardo Pounds, FNP-BC [facility] and [facility], [place]   09/25/2017, 2:29 PM

## 2017-09-25 NOTE — Patient Instructions (Addendum)
Bland Diet A bland diet consists of foods that do not have a lot of fat or fiber. Foods without fat or fiber are easier for the body to digest. They are also less likely to irritate your mouth, throat, stomach, and other parts of your gastrointestinal tract. A bland diet is sometimes called a BRAT diet. What is my plan? Your health care provider or dietitian may recommend specific changes to your diet to prevent and treat your symptoms, such as:  Eating small meals often.  Cooking food until it is soft enough to chew easily.  Chewing your food well.  Drinking fluids slowly.  Not eating foods that are very spicy, sour, or fatty.  Not eating citrus fruits, such as oranges and grapefruit.  What do I need to know about this diet?  Eat a variety of foods from the bland diet food list.  Do not follow a bland diet longer than you have to.  Ask your health care provider whether you should take vitamins. What foods can I eat? Grains  Hot cereals, such as cream of wheat. Bread, crackers, or tortillas made from refined white flour. Rice. Vegetables Canned or cooked vegetables. Mashed or boiled potatoes. Fruits Bananas. Applesauce. Other types of cooked or canned fruit with the skin and seeds removed, such as canned peaches or pears. Meats and Other Protein Sources Scrambled eggs. Creamy peanut butter or other nut butters. Lean, well-cooked meats, such as chicken or fish. Tofu. Soups or broths. Dairy Low-fat dairy products, such as milk, cottage cheese, or yogurt. Beverages Water. Herbal tea. Apple juice. Sweets and Desserts Pudding. Custard. Fruit gelatin. Ice cream. Fats and Oils Mild salad dressings. Canola or olive oil. The items listed above may not be a complete list of allowed foods or beverages. Contact your dietitian for more options. What foods are not recommended? Foods and ingredients that are often not recommended include:  Spicy foods, such as hot sauce or  salsa.  Fried foods.  Sour foods, such as pickled or fermented foods.  Raw vegetables or fruits, especially citrus or berries.  Caffeinated drinks.  Alcohol.  Strongly flavored seasonings or condiments.  The items listed above may not be a complete list of foods and beverages that are not allowed. Contact your dietitian for more information. This information is not intended to replace advice given to you by your health care provider. Make sure you discuss any questions you have with your health care provider. Document Released: 09/05/2015 Document Revised: 10/20/2015 Document Reviewed: 05/26/2014 Elsevier Interactive Patient Education  2018 [employer] for Gastroesophageal Reflux Disease, Adult When you have gastroesophageal reflux disease (GERD), the foods you eat and your eating habits are very important. Choosing the right foods can help ease the discomfort of GERD. Consider working with a diet and nutrition specialist (dietitian) to help you make healthy food choices. What general guidelines should I follow? Eating plan  Choose healthy foods low in fat, such as fruits, vegetables, whole grains, low-fat dairy products, and lean meat, fish, and poultry.  Eat frequent, small meals instead of three large meals each day. Eat your meals slowly, in a relaxed setting. Avoid bending over or lying down until 2-3 hours after eating.  Limit high-fat foods such as fatty meats or fried foods.  Limit your intake of oils, butter, and shortening to less than 8 teaspoons each day.  Avoid the following: ? Foods that cause symptoms. These may be different for different people. Keep a food diary to  keep track of foods that cause symptoms. ? Alcohol. ? Drinking large amounts of liquid with meals. ? Eating meals during the 2-3 hours before bed.  Cook foods using methods other than frying. This may include baking, grilling, or broiling. Lifestyle   Maintain a healthy weight.  Ask your health care provider what weight is healthy for you. If you need to lose weight, work with your health care provider to do so safely.  Exercise for at least 30 minutes on 5 or more days each week, or as told by your health care provider.  Avoid wearing clothes that fit tightly around your waist and chest.  Do not use any products that contain nicotine or tobacco, such as cigarettes and e-cigarettes. If you need help quitting, ask your health care provider.  Sleep with the head of your bed raised. Use a wedge under the mattress or blocks under the bed frame to raise the head of the bed. What foods are not recommended? The items listed may not be a complete list. Talk with your dietitian about what dietary choices are best for you. Grains Pastries or quick breads with added fat. [place] toast. Vegetables Deep fried vegetables. [place] fries. Any vegetables prepared with added fat. Any vegetables that cause symptoms. For some people this may include tomatoes and tomato products, chili peppers, onions and garlic, and horseradish. Fruits Any fruits prepared with added fat. Any fruits that cause symptoms. For some people this may include citrus fruits, such as oranges, grapefruit, pineapple, and lemons. Meats and other protein foods High-fat meats, such as fatty beef or pork, hot dogs, ribs, ham, sausage, salami and bacon. Fried meat or protein, including fried fish and fried chicken. Nuts and nut butters. Dairy Whole milk and chocolate milk. Sour cream. Cream. Ice cream. Cream cheese. Milk shakes. Beverages Coffee and tea, with or without caffeine. Carbonated beverages. Sodas. Energy drinks. Fruit juice made with acidic fruits (such as orange or grapefruit). Tomato juice. Alcoholic drinks. Fats and oils Butter. Margarine. Shortening. Ghee. Sweets and desserts Chocolate and cocoa. Donuts. Seasoning and other foods Pepper. Peppermint and spearmint. Any condiments, herbs, or seasonings  that cause symptoms. For some people, this may include curry, hot sauce, or vinegar-based salad dressings. Summary  When you have gastroesophageal reflux disease (GERD), food and lifestyle choices are very important to help ease the discomfort of GERD.  Eat frequent, small meals instead of three large meals each day. Eat your meals slowly, in a relaxed setting. Avoid bending over or lying down until 2-3 hours after eating.  Limit high-fat foods such as fatty meat or fried foods. This information is not intended to replace advice given to you by your health care provider. Make sure you discuss any questions you have with your health care provider. Document Released: 05/14/2005 Document Revised: 05/15/2016 Document Reviewed: 05/15/2016 Elsevier Interactive Patient Education  2018 [employer].  Heartburn Heartburn is a type of pain or discomfort that can happen in the throat or chest. It is often described as a burning pain. It may also cause a bad taste in the mouth. Heartburn may feel worse when you lie down or bend over. It may be caused by stomach contents that move back up (reflux) into the tube that connects the mouth with the stomach (esophagus). Follow these instructions at home: Take these actions to lessen your discomfort and to help avoid problems. Diet  Follow a diet as told by your doctor. You may need to avoid foods and drinks such  as: ? Coffee and tea (with or without caffeine). ? Drinks that contain alcohol. ? Energy drinks and sports drinks. ? Carbonated drinks or sodas. ? Chocolate and cocoa. ? Peppermint and mint flavorings. ? Garlic and onions. ? Horseradish. ? Spicy and acidic foods, such as peppers, chili powder, curry powder, vinegar, hot sauces, and BBQ sauce. ? Citrus fruit juices and citrus fruits, such as oranges, lemons, and limes. ? Tomato-based foods, such as red sauce, chili, salsa, and pizza with red sauce. ? Fried and fatty foods, such as donuts, french  fries, potato chips, and high-fat dressings. ? High-fat meats, such as hot dogs, rib eye steak, sausage, ham, and bacon. ? High-fat dairy items, such as whole milk, butter, and cream cheese.  Eat small meals often. Avoid eating large meals.  Avoid drinking large amounts of liquid with your meals.  Avoid eating meals during the 2-3 hours before bedtime.  Avoid lying down right after you eat.  Do not exercise right after you eat. General instructions  Pay attention to any changes in your symptoms.  Take over-the-counter and prescription medicines only as told by your doctor. Do not take aspirin, ibuprofen, or other NSAIDs unless your doctor says it is okay.  Do not use any tobacco products, including cigarettes, chewing tobacco, and e-cigarettes. If you need help quitting, ask your doctor.  Wear loose clothes. Do not wear anything tight around your waist.  Raise (elevate) the head of your bed about 6 inches (15 cm).  Try to lower your stress. If you need help doing this, ask your doctor.  If you are overweight, lose an amount of weight that is healthy for you. Ask your doctor about a safe weight loss goal.  Keep all follow-up visits as told by your doctor. This is important. Contact a doctor if:  You have new symptoms.  You lose weight and you do not know why it is happening.  You have trouble swallowing, or it hurts to swallow.  You have wheezing or a cough that keeps happening.  Your symptoms do not get better with treatment.  You have heartburn often for more than two weeks. Get help right away if:  You have pain in your arms, neck, jaw, teeth, or back.  You feel sweaty, dizzy, or light-headed.  You have chest pain or shortness of breath.  You throw up (vomit) and your throw up looks like blood or coffee grounds.  Your poop (stool) is bloody or black. This information is not intended to replace advice given to you by your health care provider. Make sure you  discuss any questions you have with your health care provider. Document Released: 01/24/2011 Document Revised: 10/20/2015 Document Reviewed: 09/08/2014 Elsevier Interactive Patient Education  2018 [employer]  Chronic Obstructive Pulmonary Disease Chronic obstructive pulmonary disease (COPD) is a long-term (chronic) lung problem. When you have COPD, it is hard for air to get in and out of your lungs. The way your lungs work will never return to normal. Usually the condition gets worse over time. There are things you can do to keep yourself as healthy as possible. Your doctor may treat your condition with:  Medicines.  Quitting smoking, if you smoke.  Rehabilitation. This may involve a team of specialists.  Oxygen.  Exercise and changes to your diet.  Lung surgery.  Comfort measures (palliative care).  Follow these instructions at home: Medicines  Take over-the-counter and prescription medicines only as told by your doctor.  Talk to your doctor  before taking any cough or allergy medicines. You may need to avoid medicines that cause your lungs to be dry. Lifestyle  If you smoke, stop. Smoking makes the problem worse. If you need help quitting, ask your doctor.  Avoid being around things that make your breathing worse. This may include smoke, chemicals, and fumes.  Stay active, but remember to also rest.  Learn and use tips on how to relax.  Make sure you get enough sleep. Most adults need at least 7 hours a night.  Eat healthy foods. Eat smaller meals more often. Rest before meals. Controlled breathing  Learn and use tips on how to control your breathing as told by your doctor. Try: ? Breathing in (inhaling) through your nose for 1 second. Then, pucker your lips and breath out (exhale) through your lips for 2 seconds. ? Putting one hand on your belly (abdomen). Breathe in slowly through your nose for 1 second. Your hand on your belly should move out. Pucker your lips and  breathe out slowly through your lips. Your hand on your belly should move in as you breathe out. Controlled coughing  Learn and use controlled coughing to clear mucus from your lungs. The steps are: 1. Lean your head a little forward. 2. Breathe in deeply. 3. Try to hold your breath for 3 seconds. 4. Keep your mouth slightly open while coughing 2 times. 5. Spit any mucus out into a tissue. 6. Rest and do the steps again 1 or 2 times as needed. General instructions  Make sure you get all the shots (vaccines) that your doctor recommends. Ask your doctor about a flu shot and a pneumonia shot.  Use oxygen therapy and therapy to help improve your lungs (pulmonary rehabilitation) if told by your doctor. If you need home oxygen therapy, ask your doctor if you should buy a tool to measure your oxygen level (oximeter).  Make a COPD action plan with your doctor. This helps you know what to do if you feel worse than usual.  Manage any other conditions you have as told by your doctor.  Avoid going outside when it is very hot, cold, or humid.  Avoid people who have a sickness you can catch (contagious).  Keep all follow-up visits as told by your doctor. This is important. Contact a doctor if:  You cough up more mucus than usual.  There is a change in the color or thickness of the mucus.  It is harder to breathe than usual.  Your breathing is faster than usual.  You have trouble sleeping.  You need to use your medicines more often than usual.  You have trouble doing your normal activities such as getting dressed or walking around the house. Get help right away if:  You have shortness of breath while resting.  You have shortness of breath that stops you from: ? Being able to talk. ? Doing normal activities.  Your chest hurts for longer than 5 minutes.  Your skin color is more blue than usual.  Your pulse oximeter shows that you have low oxygen for longer than 5 minutes.  You  have a fever.  You feel too tired to breathe normally. Summary  Chronic obstructive pulmonary disease (COPD) is a long-term lung problem.  The way your lungs work will never return to normal. Usually the condition gets worse over time. There are things you can do to keep yourself as healthy as possible.  Take over-the-counter and prescription medicines only as told by  your doctor.  If you smoke, stop. Smoking makes the problem worse. This information is not intended to replace advice given to you by your health care provider. Make sure you discuss any questions you have with your health care provider. Document Released: 10/31/2007 Document Revised: 10/20/2015 Document Reviewed: 01/08/2013 Elsevier Interactive Patient Education  2017 [employer]  Chronic Obstructive Pulmonary Disease Chronic obstructive pulmonary disease (COPD) is a long-term (chronic) lung problem. When you have COPD, it is hard for air to get in and out of your lungs. The way your lungs work will never return to normal. Usually the condition gets worse over time. There are things you can do to keep yourself as healthy as possible. Your doctor may treat your condition with:  Medicines.  Quitting smoking, if you smoke.  Rehabilitation. This may involve a team of specialists.  Oxygen.  Exercise and changes to your diet.  Lung surgery.  Comfort measures (palliative care).  Follow these instructions at home: Medicines  Take over-the-counter and prescription medicines only as told by your doctor.  Talk to your doctor before taking any cough or allergy medicines. You may need to avoid medicines that cause your lungs to be dry. Lifestyle  If you smoke, stop. Smoking makes the problem worse. If you need help quitting, ask your doctor.  Avoid being around things that make your breathing worse. This may include smoke, chemicals, and fumes.  Stay active, but remember to also rest.  Learn and use tips on how to  relax.  Make sure you get enough sleep. Most adults need at least 7 hours a night.  Eat healthy foods. Eat smaller meals more often. Rest before meals. Controlled breathing  Learn and use tips on how to control your breathing as told by your doctor. Try: ? Breathing in (inhaling) through your nose for 1 second. Then, pucker your lips and breath out (exhale) through your lips for 2 seconds. ? Putting one hand on your belly (abdomen). Breathe in slowly through your nose for 1 second. Your hand on your belly should move out. Pucker your lips and breathe out slowly through your lips. Your hand on your belly should move in as you breathe out. Controlled coughing  Learn and use controlled coughing to clear mucus from your lungs. The steps are: 1. Lean your head a little forward. 2. Breathe in deeply. 3. Try to hold your breath for 3 seconds. 4. Keep your mouth slightly open while coughing 2 times. 5. Spit any mucus out into a tissue. 6. Rest and do the steps again 1 or 2 times as needed. General instructions  Make sure you get all the shots (vaccines) that your doctor recommends. Ask your doctor about a flu shot and a pneumonia shot.  Use oxygen therapy and therapy to help improve your lungs (pulmonary rehabilitation) if told by your doctor. If you need home oxygen therapy, ask your doctor if you should buy a tool to measure your oxygen level (oximeter).  Make a COPD action plan with your doctor. This helps you know what to do if you feel worse than usual.  Manage any other conditions you have as told by your doctor.  Avoid going outside when it is very hot, cold, or humid.  Avoid people who have a sickness you can catch (contagious).  Keep all follow-up visits as told by your doctor. This is important. Contact a doctor if:  You cough up more mucus than usual.  There is a change in the  color or thickness of the mucus.  It is harder to breathe than usual.  Your breathing is faster  than usual.  You have trouble sleeping.  You need to use your medicines more often than usual.  You have trouble doing your normal activities such as getting dressed or walking around the house. Get help right away if:  You have shortness of breath while resting.  You have shortness of breath that stops you from: ? Being able to talk. ? Doing normal activities.  Your chest hurts for longer than 5 minutes.  Your skin color is more blue than usual.  Your pulse oximeter shows that you have low oxygen for longer than 5 minutes.  You have a fever.  You feel too tired to breathe normally. Summary  Chronic obstructive pulmonary disease (COPD) is a long-term lung problem.  The way your lungs work will never return to normal. Usually the condition gets worse over time. There are things you can do to keep yourself as healthy as possible.  Take over-the-counter and prescription medicines only as told by your doctor.  If you smoke, stop. Smoking makes the problem worse. This information is not intended to replace advice given to you by your health care provider. Make sure you discuss any questions you have with your health care provider. Document Released: 10/31/2007 Document Revised: 10/20/2015 Document Reviewed: 01/08/2013 Elsevier Interactive Patient Education  2017 [employer]  Chronic Obstructive Pulmonary Disease Chronic obstructive pulmonary disease (COPD) is a long-term (chronic) lung problem. When you have COPD, it is hard for air to get in and out of your lungs. The way your lungs work will never return to normal. Usually the condition gets worse over time. There are things you can do to keep yourself as healthy as possible. Your doctor may treat your condition with:  Medicines.  Quitting smoking, if you smoke.  Rehabilitation. This may involve a team of specialists.  Oxygen.  Exercise and changes to your diet.  Lung surgery.  Comfort measures (palliative  care).  Follow these instructions at home: Medicines  Take over-the-counter and prescription medicines only as told by your doctor.  Talk to your doctor before taking any cough or allergy medicines. You may need to avoid medicines that cause your lungs to be dry. Lifestyle  If you smoke, stop. Smoking makes the problem worse. If you need help quitting, ask your doctor.  Avoid being around things that make your breathing worse. This may include smoke, chemicals, and fumes.  Stay active, but remember to also rest.  Learn and use tips on how to relax.  Make sure you get enough sleep. Most adults need at least 7 hours a night.  Eat healthy foods. Eat smaller meals more often. Rest before meals. Controlled breathing  Learn and use tips on how to control your breathing as told by your doctor. Try: ? Breathing in (inhaling) through your nose for 1 second. Then, pucker your lips and breath out (exhale) through your lips for 2 seconds. ? Putting one hand on your belly (abdomen). Breathe in slowly through your nose for 1 second. Your hand on your belly should move out. Pucker your lips and breathe out slowly through your lips. Your hand on your belly should move in as you breathe out. Controlled coughing  Learn and use controlled coughing to clear mucus from your lungs. The steps are: 1. Lean your head a little forward. 2. Breathe in deeply. 3. Try to hold your breath for 3  seconds. 4. Keep your mouth slightly open while coughing 2 times. 5. Spit any mucus out into a tissue. 6. Rest and do the steps again 1 or 2 times as needed. General instructions  Make sure you get all the shots (vaccines) that your doctor recommends. Ask your doctor about a flu shot and a pneumonia shot.  Use oxygen therapy and therapy to help improve your lungs (pulmonary rehabilitation) if told by your doctor. If you need home oxygen therapy, ask your doctor if you should buy a tool to measure your oxygen level  (oximeter).  Make a COPD action plan with your doctor. This helps you know what to do if you feel worse than usual.  Manage any other conditions you have as told by your doctor.  Avoid going outside when it is very hot, cold, or humid.  Avoid people who have a sickness you can catch (contagious).  Keep all follow-up visits as told by your doctor. This is important. Contact a doctor if:  You cough up more mucus than usual.  There is a change in the color or thickness of the mucus.  It is harder to breathe than usual.  Your breathing is faster than usual.  You have trouble sleeping.  You need to use your medicines more often than usual.  You have trouble doing your normal activities such as getting dressed or walking around the house. Get help right away if:  You have shortness of breath while resting.  You have shortness of breath that stops you from: ? Being able to talk. ? Doing normal activities.  Your chest hurts for longer than 5 minutes.  Your skin color is more blue than usual.  Your pulse oximeter shows that you have low oxygen for longer than 5 minutes.  You have a fever.  You feel too tired to breathe normally. Summary  Chronic obstructive pulmonary disease (COPD) is a long-term lung problem.  The way your lungs work will never return to normal. Usually the condition gets worse over time. There are things you can do to keep yourself as healthy as possible.  Take over-the-counter and prescription medicines only as told by your doctor.  If you smoke, stop. Smoking makes the problem worse. This information is not intended to replace advice given to you by your health care provider. Make sure you discuss any questions you have with your health care provider. Document Released: 10/31/2007 Document Revised: 10/20/2015 Document Reviewed: 01/08/2013 Elsevier Interactive Patient Education  2017 [employer].

## 2017-10-02 MED FILL — FINASTERIDE 5 MG TABLET: 5 | 30 days supply | Qty: 30 | Fill #3

## 2017-10-02 MED FILL — LOSARTAN POTASSIUM 100 MG T: 100 | 30 days supply | Qty: 30 | Fill #5

## 2017-10-02 MED FILL — AMLODIPINE BESYLATE 5 MG TA: 5 | 30 days supply | Qty: 30 | Fill #2

## 2017-10-02 MED FILL — DORZOLAMIDE HCL 2% EYE DRP: 2 | 50 days supply | Qty: 10 | Fill #0

## 2017-10-02 MED FILL — TAMSULOSIN HCL 0.4 MG CAP: 0.4 | 30 days supply | Qty: 30 | Fill #0

## 2017-10-04 MED FILL — TIMOLOL 0.5% EYE DROPS: 0.5 | 30 days supply | Qty: 5 | Fill #3

## 2017-11-04 MED FILL — FINASTERIDE 5 MG TABLET: 5 | 30 days supply | Qty: 30 | Fill #4

## 2017-11-04 MED FILL — LOSARTAN POTASSIUM 100 MG T: 100 | 30 days supply | Qty: 30 | Fill #6

## 2017-11-04 MED FILL — TIMOLOL 0.5% EYE DROPS: 0.5 | 30 days supply | Qty: 5 | Fill #4

## 2017-11-04 MED FILL — AMLODIPINE BESYLATE 5 MG TA: 5 | 30 days supply | Qty: 30 | Fill #3

## 2017-11-04 MED FILL — TAMSULOSIN HCL 0.4 MG CAP: 0.4 | 30 days supply | Qty: 30 | Fill #1

## 2017-11-18 MED FILL — DORZOLAMIDE HCL 2% EYE DRP: 2 | 50 days supply | Qty: 10 | Fill #1

## 2017-11-18 MED FILL — FLUOROMETHOLONE 0.1% DROPS: 0.1 | 25 days supply | Qty: 5 | Fill #1

## 2017-12-02 MED FILL — AMLODIPINE BESYLATE 5 MG TA: 5 | 30 days supply | Qty: 30 | Fill #4

## 2017-12-02 MED FILL — TAMSULOSIN HCL 0.4 MG CAP: 0.4 | 30 days supply | Qty: 30 | Fill #2

## 2017-12-02 MED FILL — FINASTERIDE 5 MG TABLET: 5 | 30 days supply | Qty: 30 | Fill #5

## 2017-12-03 MED FILL — OMEPRAZOLE 20 MG CAP: 20 | 30 days supply | Qty: 30 | Fill #1

## 2017-12-04 MED FILL — LOSARTAN POTASSIUM 100 MG T: 100 | 30 days supply | Qty: 30 | Fill #0

## 2017-12-22 ENCOUNTER — Other Ambulatory Visit: Payer: Self-pay

## 2017-12-22 ENCOUNTER — Encounter (HOSPITAL_COMMUNITY): Payer: Self-pay

## 2017-12-22 ENCOUNTER — Emergency Department (HOSPITAL_COMMUNITY)
Admission: EM | Admit: 2017-12-22 | Discharge: 2017-12-22 | Disposition: A | Discharge status: Home / Self Care | Payer: Self-pay | Attending: Emergency Medicine | Admitting: Emergency Medicine

## 2017-12-22 ENCOUNTER — Emergency Department (HOSPITAL_COMMUNITY): Payer: Self-pay

## 2017-12-22 DIAGNOSIS — Z79899 Other long term (current) drug therapy: Secondary | ICD-10-CM | POA: Insufficient documentation

## 2017-12-22 DIAGNOSIS — Z87891 Personal history of nicotine dependence: Secondary | ICD-10-CM | POA: Insufficient documentation

## 2017-12-22 DIAGNOSIS — I10 Essential (primary) hypertension: Secondary | ICD-10-CM | POA: Insufficient documentation

## 2017-12-22 DIAGNOSIS — J4541 Moderate persistent asthma with (acute) exacerbation: Secondary | ICD-10-CM | POA: Insufficient documentation

## 2017-12-22 MED ORDER — IPRATROPIUM BROMIDE 0.02 % IN SOLN
0.5000 mg | Freq: Once | RESPIRATORY_TRACT | Status: AC
  Administered 2017-12-22: 0.5 mg via RESPIRATORY_TRACT
  Filled 2017-12-22: qty 2.5

## 2017-12-22 MED ORDER — ALBUTEROL SULFATE (2.5 MG/3ML) 0.083% IN NEBU
5.0000 mg | INHALATION_SOLUTION | Freq: Once | RESPIRATORY_TRACT | Status: AC
  Administered 2017-12-22: 5 mg via RESPIRATORY_TRACT
  Filled 2017-12-22: qty 6

## 2017-12-22 MED ORDER — PREDNISONE 20 MG PO TABS
60.0000 mg | ORAL_TABLET | Freq: Once | ORAL | Status: AC
  Administered 2017-12-22: 60 mg via ORAL
  Filled 2017-12-22: qty 3

## 2017-12-22 MED ORDER — PREDNISONE 20 MG PO TABS: 40.0000 mg | ORAL_TABLET | Freq: Every day | ORAL | 0 refills | Status: DC

## 2017-12-22 NOTE — ED Triage Notes (Signed)
He reports shortness of breath and frequent chest "pressure" for about a week now. He also states he has been regularly taking his home albuterol nebulizer treatments with no appreciable improvement. He is in no distress. EKG performed at triage.

## 2017-12-22 NOTE — ED Provider Notes (Signed)
[facility] DEPT Provider Note   CSN: [id number] Arrival date & time: 12/22/17  1813     History   Chief Complaint Chief Complaint  Patient presents with  . Shortness of Breath    HPI Bryan Sullivan is a 72 y.o. male.  The history is provided by the patient.  Shortness of Breath  This is a new problem. The average episode lasts 5 days. The problem occurs continuously.The current episode started more than 2 days ago. The problem has been gradually worsening. Associated symptoms include cough, sputum production and wheezing. Pertinent negatives include no fever, no rhinorrhea, no chest pain, no vomiting, no abdominal pain, no leg pain and no leg swelling. It is unknown what precipitated the problem. He has tried beta-agonist inhalers and inhaled steroids for the symptoms. The treatment provided no relief. He has had prior hospitalizations. He has had prior ED visits. Associated medical issues include asthma. Associated medical issues do not include PE, CAD or heart failure.    Past Medical History:  Diagnosis Date  . Asthma    As child   . Enlarged prostate Dx 2001  . Hypertension    Dx at age 27    Patient Active Problem List   Diagnosis Date Noted  . Gastroesophageal reflux disease 10/08/2016  . Glaucoma (increased eye pressure) 12/16/2014  . Lumbar back pain 12/16/2014  . BPH (benign prostatic hyperplasia) 11/24/2014  . DJD (degenerative joint disease) of knee 11/24/2014  . Chronic obstructive airway disease with asthma ([place]) 10/11/2014  . Essential hypertension 09/22/2014    Past Surgical History:  Procedure Laterality Date  . CATARACT EXTRACTION W/ INTRAOCULAR LENS  IMPLANT, BILATERAL Bilateral   . CORNEAL TRANSPLANT Right   . EYE SURGERY          Home Medications    Prior to Admission medications   Medication Sig Start Date End Date Taking? Authorizing Provider  acetaZOLAMIDE (DIAMOX) 250 MG tablet Take 500 mg by mouth 2  (two) times daily. 06/24/17   [provider]  albuterol (PROAIR HFA) 108 (90 Base) MCG/ACT inhaler INHALE 2 PUFFS INTO THE LUNGS EVERY 2 HOURS AS NEEDED FOR WHEEZING, SHORTNESS OF BREATH, OR COUGH. 12/18/16   Angelica Chessman E, MD  albuterol (PROVENTIL) (2.5 MG/3ML) 0.083% nebulizer solution USE 3 MLS (2.5 MG TOTAL) BY NEBULIZATION EVERY 6 (SIX) HOURS AS NEEDED FOR WHEEZING OR SHORTNESS OF BREATH. 12/17/16   Funches, Adriana Mccallum, MD  amLODipine (NORVASC) 5 MG tablet Take 1 tablet (5 mg total) by mouth daily. 09/25/17   Gildardo Pounds, NP  beclomethasone (QVAR) 80 MCG/ACT inhaler Inhale 2 puffs into the lungs 2 (two) times daily. 09/25/17   Gildardo Pounds, NP  brimonidine (ALPHAGAN) 0.2 % ophthalmic solution Place 1 drop into the right eye 3 (three) times daily. 09/25/17 09/25/18  Gildardo Pounds, NP  Cholecalciferol (VITAMIN D-1000 MAX ST) 1000 units tablet Take 1,000 Units by mouth daily. 07/23/16   [provider]  dorzolamide (TRUSOPT) 2 % ophthalmic solution Place 1 drop into the right eye 3 (three) times daily. 09/25/17   Gildardo Pounds, NP  finasteride (PROSCAR) 5 MG tablet Take 1 tablet (5 mg total) by mouth daily. 09/25/17   Gildardo Pounds, NP  fluorometholone (FML) 0.1 % ophthalmic suspension Place 1 drop into both eyes daily. 09/25/17   Gildardo Pounds, NP  fluticasone (FLONASE) 50 MCG/ACT nasal spray Place 2 sprays into both nostrils daily. Patient not taking: Reported on 09/25/2017 07/26/16  Freeman Caldron M, PA-C  Fluticasone-Salmeterol (ADVAIR) 100-50 MCG/DOSE AEPB Inhale 1 puff into the lungs 2 (two) times daily. 09/25/17   Gildardo Pounds, NP  latanoprost (XALATAN) 0.005 % ophthalmic solution Place 1 drop into both eyes at bedtime. 09/25/17   Gildardo Pounds, NP  losartan (COZAAR) 100 MG tablet Take 1 tablet (100 mg total) by mouth daily. 09/25/17   Gildardo Pounds, NP  omeprazole (PRILOSEC) 20 MG capsule Take 1 capsule (20 mg total) by mouth daily. 09/25/17 12/24/17  Gildardo Pounds, NP  tamsulosin (FLOMAX) 0.4 MG CAPS capsule Take 1 capsule (0.4 mg total) by mouth daily. 09/25/17   Gildardo Pounds, NP  timolol (TIMOPTIC) 0.5 % ophthalmic solution Place 1 drop into the right eye 2 (two) times daily. 09/25/17   Gildardo Pounds, NP    Family History Family History  Problem Relation Age of Onset  . Diabetes Mother   . Asthma Father   . Asthma Sister     Social History Social History   Tobacco Use  . Smoking status: Former Smoker    Types: Cigarettes    Last attempt to quit: 09/21/1968    Years since quitting: 49.2  . Smokeless tobacco: Never Used  . Tobacco comment: "quit smoking cigarettes in 1970"  Substance Use Topics  . Alcohol use: No  . Drug use: No     Allergies   Patient has no known allergies.   Review of Systems Review of Systems  Constitutional: Negative for fever.  HENT: Negative for rhinorrhea.   Respiratory: Positive for cough, sputum production, shortness of breath and wheezing.   Cardiovascular: Negative for chest pain and leg swelling.  Gastrointestinal: Negative for abdominal pain and vomiting.  All other systems reviewed and are negative.    Physical Exam Updated Vital Signs BP (!) 143/78 (BP Location: Right Arm)   Pulse 77   Temp 98 F (36.7 C) (Oral)   Resp 18   SpO2 96%   Physical Exam  Constitutional: He is oriented to person, place, and time. He appears well-developed and well-nourished. No distress.  HENT:  Head: Normocephalic and atraumatic.  Mouth/Throat: Oropharynx is clear and moist.  Eyes: Pupils are equal, round, and reactive to light. Conjunctivae and EOM are normal.  Neck: Normal range of motion. Neck supple.  Cardiovascular: Normal rate, regular rhythm and intact distal pulses.  No murmur heard. Pulmonary/Chest: Effort normal. No respiratory distress. He has wheezes. He has no rales.  Abdominal: Soft. He exhibits no distension. There is no tenderness. There is no rebound and no guarding.    Musculoskeletal: Normal range of motion. He exhibits no edema or tenderness.       Right lower leg: He exhibits no edema.       Left lower leg: He exhibits no edema.  Neurological: He is alert and oriented to person, place, and time.  Skin: Skin is warm and dry. Capillary refill takes less than 2 seconds. No rash noted. No erythema.  Psychiatric: He has a normal mood and affect. His behavior is normal.  Nursing note and vitals reviewed.    ED Treatments / Results  Labs (all labs ordered are listed, but only abnormal results are displayed) Labs Reviewed - No data to display  EKG EKG Interpretation  Date/Time:  Sunday December 22 2017 18:28:50 EDT Ventricular Rate:  82 PR Interval:    QRS Duration: 97 QT Interval:  366 QTC Calculation: 428 R Axis:   -56 Text Interpretation:  Sinus or ectopic atrial rhythm Left anterior fascicular block Abnormal R-wave progression, early transition No significant change since last tracing Confirmed by Blanchie Dessert [phone]) on 12/22/2017 7:06:10 PM   Radiology Dg Chest 2 View  Result Date: 12/22/2017 CLINICAL DATA:  Shortness of breath EXAM: CHEST - 2 VIEW COMPARISON:  07/04/2017 FINDINGS: Mild hyperinflation. Heart and mediastinal contours are within normal limits. No focal opacities or effusions. No acute bony abnormality. IMPRESSION: Hyperinflation.  No active disease. Electronically Signed   By: Rolm Baptise M.D.   On: 12/22/2017 19:06    Procedures Procedures (including critical care time)  Medications Ordered in ED Medications  albuterol (PROVENTIL) (2.5 MG/3ML) 0.083% nebulizer solution 5 mg (has no administration in time range)  ipratropium (ATROVENT) nebulizer solution 0.5 mg (has no administration in time range)  predniSONE (DELTASONE) tablet 60 mg (has no administration in time range)     Initial Impression / Assessment and Plan / ED Course  I have reviewed the triage vital signs and the nursing notes.  Pertinent labs & imaging  results that were available during my care of the patient were reviewed by me and considered in my medical decision making (see chart for details).     Pt with typical asthma exacerbation  symptoms.  No infectious sx,  or other complaints concerning for fluid overload.  Wheezing on exam.  will give steroids, albuterol/atrovent and recheck.  CXR wnl.  8:49 PM Pt wheezing resolved and feels much better.  Final Clinical Impressions(s) / ED Diagnoses   Final diagnoses:  Moderate persistent asthma with exacerbation    ED Discharge Orders        Ordered    predniSONE (DELTASONE) 20 MG tablet  Daily     12/22/17 2049       Blanchie Dessert, MD 12/22/17 2050

## 2017-12-23 MED FILL — predniSONE 20 MG TABS: 20 | 5 days supply | Qty: 10 | Fill #0

## 2017-12-23 MED FILL — !ADVAIR 100/50 DISKUS: 100-50 | 30 days supply | Qty: 60 | Fill #1

## 2017-12-29 ENCOUNTER — Emergency Department (HOSPITAL_COMMUNITY)
Admission: EM | Admit: 2017-12-29 | Discharge: 2017-12-29 | Disposition: A | Discharge status: Home / Self Care | Payer: Self-pay | Attending: Emergency Medicine | Admitting: Emergency Medicine

## 2017-12-29 ENCOUNTER — Encounter (HOSPITAL_COMMUNITY): Payer: Self-pay | Admitting: Emergency Medicine

## 2017-12-29 ENCOUNTER — Emergency Department (HOSPITAL_COMMUNITY): Payer: Self-pay

## 2017-12-29 DIAGNOSIS — I10 Essential (primary) hypertension: Secondary | ICD-10-CM | POA: Insufficient documentation

## 2017-12-29 DIAGNOSIS — R072 Precordial pain: Secondary | ICD-10-CM | POA: Insufficient documentation

## 2017-12-29 DIAGNOSIS — Z87891 Personal history of nicotine dependence: Secondary | ICD-10-CM | POA: Insufficient documentation

## 2017-12-29 DIAGNOSIS — J441 Chronic obstructive pulmonary disease with (acute) exacerbation: Secondary | ICD-10-CM | POA: Insufficient documentation

## 2017-12-29 LAB — CBC WITH DIFFERENTIAL/PLATELET
Basophils Absolute: 0 10*3/uL (ref 0.0–0.1)
Basophils Relative: 0 %
Eosinophils Absolute: 0.2 10*3/uL (ref 0.0–0.7)
Eosinophils Relative: 2 %
HCT: 40.8 % (ref 39.0–52.0)
Hemoglobin: 13.5 g/dL (ref 13.0–17.0)
Lymphocytes Relative: 17 %
Lymphs Abs: 1.5 10*3/uL (ref 0.7–4.0)
MCH: 28.9 pg (ref 26.0–34.0)
MCHC: 33.1 g/dL (ref 30.0–36.0)
MCV: 87.4 fL (ref 78.0–100.0)
Monocytes Absolute: 0.8 10*3/uL (ref 0.1–1.0)
Monocytes Relative: 9 %
Neutro Abs: 6.1 10*3/uL (ref 1.7–7.7)
Neutrophils Relative %: 72 %
Platelets: 271 10*3/uL (ref 150–400)
RBC: 4.67 MIL/uL (ref 4.22–5.81)
RDW: 13.1 % (ref 11.5–15.5)
WBC: 8.6 10*3/uL (ref 4.0–10.5)

## 2017-12-29 LAB — BASIC METABOLIC PANEL
Anion gap: 9 (ref 5–15)
BUN: 25 mg/dL — ABNORMAL HIGH (ref 8–23)
CO2: 29 mmol/L (ref 22–32)
Calcium: 8.6 mg/dL — ABNORMAL LOW (ref 8.9–10.3)
Chloride: 99 mmol/L (ref 98–111)
Creatinine, Ser: 1.37 mg/dL — ABNORMAL HIGH (ref 0.61–1.24)
GFR calc Af Amer: 57 mL/min — ABNORMAL LOW (ref >60)
GFR calc non Af Amer: 49 mL/min — ABNORMAL LOW (ref >60)
Glucose, Bld: 103 mg/dL — ABNORMAL HIGH (ref 70–99)
Potassium: 4.2 mmol/L (ref 3.5–5.1)
Sodium: 137 mmol/L (ref 135–145)

## 2017-12-29 LAB — I-STAT TROPONIN, ED: Troponin i, poc: 0.02 ng/mL (ref 0.00–0.08)

## 2017-12-29 MED ORDER — IPRATROPIUM-ALBUTEROL 0.5-2.5 (3) MG/3ML IN SOLN
3.0000 mL | Freq: Once | RESPIRATORY_TRACT | Status: AC
  Administered 2017-12-29: 3 mL via RESPIRATORY_TRACT
  Filled 2017-12-29: qty 3

## 2017-12-29 MED ORDER — AZITHROMYCIN 250 MG PO TABS: 250.0000 mg | ORAL_TABLET | Freq: Every day | ORAL | 0 refills | Status: DC

## 2017-12-29 MED ORDER — ALBUTEROL SULFATE HFA 108 (90 BASE) MCG/ACT IN AERS
2.0000 | INHALATION_SPRAY | Freq: Once | RESPIRATORY_TRACT | Status: AC
  Administered 2017-12-29: 2 via RESPIRATORY_TRACT
  Filled 2017-12-29: qty 6.7

## 2017-12-29 MED ORDER — METHYLPREDNISOLONE SODIUM SUCC 125 MG IJ SOLR
125.0000 mg | Freq: Once | INTRAMUSCULAR | Status: AC
Start: 2017-12-29 — End: 2017-12-29
  Administered 2017-12-29: 125 mg via INTRAVENOUS
  Filled 2017-12-29: qty 2

## 2017-12-29 MED ORDER — AZITHROMYCIN 250 MG PO TABS
500.0000 mg | ORAL_TABLET | Freq: Once | ORAL | Status: AC
  Administered 2017-12-29: 500 mg via ORAL
  Filled 2017-12-29: qty 2

## 2017-12-29 MED ORDER — ALBUTEROL SULFATE HFA 108 (90 BASE) MCG/ACT IN AERS: 1.0000 | INHALATION_SPRAY | Freq: Four times a day (QID) | RESPIRATORY_TRACT | 0 refills | Status: DC | PRN

## 2017-12-29 MED ORDER — PREDNISONE 10 MG PO TABS: 20.0000 mg | ORAL_TABLET | Freq: Every day | ORAL | 0 refills | Status: DC

## 2017-12-29 NOTE — Discharge Instructions (Signed)
We believe that your symptoms are caused today by an exacerbation of your COPD, and possibly bronchitis.  Please take the prescribed medications and any medications that you have at home for your COPD.  Follow up with your doctor as recommended.  If you develop any new or worsening symptoms, including but not limited to fever, persistent vomiting, worsening shortness of breath, or other symptoms that concern you, please return to the Emergency Department immediately. ° ° °Chronic Obstructive Pulmonary Disease °Chronic obstructive pulmonary disease (COPD) is a common lung condition in which airflow from the lungs is limited. COPD is a general term that can be used to describe many different lung problems that limit airflow, including both chronic bronchitis and emphysema.  If you have COPD, your lung function will probably never return to normal, but there are measures you can take to improve lung function and make yourself feel better.  °CAUSES  °Smoking (common).   °Exposure to secondhand smoke.   °Genetic problems. °Chronic inflammatory lung diseases or recurrent infections. °SYMPTOMS  °Shortness of breath, especially with physical activity.   °Deep, persistent (chronic) cough with a large amount of thick mucus.   °Wheezing.   °Rapid breaths (tachypnea).   °Gray or bluish discoloration (cyanosis) of the skin, especially in fingers, toes, or lips.   °Fatigue.   °Weight loss.   °Frequent infections or episodes when breathing symptoms become much worse (exacerbations).   °Chest tightness. °DIAGNOSIS  °Your health care provider will take a medical history and perform a physical examination to make the initial diagnosis.  Additional tests for COPD may include:  °Lung (pulmonary) function tests. °Chest X-ray. °CT scan. °Blood tests. °TREATMENT  °Treatment available to help you feel better when you have COPD includes:  °Inhaler and nebulizer medicines. These help manage the symptoms of COPD and make your breathing more  comfortable. °Supplemental oxygen. Supplemental oxygen is only helpful if you have a low oxygen level in your blood.   °Exercise and physical activity. These are beneficial for nearly all people with COPD. Some people may also benefit from a pulmonary rehabilitation program. °HOME CARE INSTRUCTIONS  °Take all medicines (inhaled or pills) as directed by your health care provider. °Avoid over-the-counter medicines or cough syrups that dry up your airway (such as antihistamines) and slow down the elimination of secretions unless instructed otherwise by your health care provider.   °If you are a smoker, the most important thing that you can do is stop smoking. Continuing to smoke will cause further lung damage and breathing trouble. Ask your health care provider for help with quitting smoking. He or she can direct you to community resources or hospitals that provide support. °Avoid exposure to irritants such as smoke, chemicals, and fumes that aggravate your breathing. °Use oxygen therapy and pulmonary rehabilitation if directed by your health care provider. If you require home oxygen therapy, ask your health care provider whether you should purchase a pulse oximeter to measure your oxygen level at home.   °Avoid contact with individuals who have a contagious illness. °Avoid extreme temperature and humidity changes. °Eat healthy foods. Eating smaller, more frequent meals and resting before meals may help you maintain your strength. °Stay active, but balance activity with periods of rest. Exercise and physical activity will help you maintain your ability to do things you want to do. °Preventing infection and hospitalization is very important when you have COPD. Make sure to receive all the vaccines your health care provider recommends, especially the pneumococcal and influenza   vaccines. Ask your health care provider whether you need a pneumonia vaccine. °Learn and use relaxation techniques to manage stress. °Learn and  use controlled breathing techniques as directed by your health care provider. Controlled breathing techniques include:   °Pursed lip breathing. Start by breathing in (inhaling) through your nose for 1 second. Then, purse your lips as if you were going to whistle and breathe out (exhale) through the pursed lips for 2 seconds.   °Diaphragmatic breathing. Start by putting one hand on your abdomen just above your waist. Inhale slowly through your nose. The hand on your abdomen should move out. Then purse your lips and exhale slowly. You should be able to feel the hand on your abdomen moving in as you exhale.   °Learn and use controlled coughing to clear mucus from your lungs. Controlled coughing is a series of short, progressive coughs. The steps of controlled coughing are:   °Lean your head slightly forward.   °Breathe in deeply using diaphragmatic breathing.   °Try to hold your breath for 3 seconds.   °Keep your mouth slightly open while coughing twice.   °Spit any mucus out into a tissue.   °Rest and repeat the steps once or twice as needed. °SEEK MEDICAL CARE IF:  °You are coughing up more mucus than usual.   °There is a change in the color or thickness of your mucus.   °Your breathing is more labored than usual.   °Your breathing is faster than usual.   °SEEK IMMEDIATE MEDICAL CARE IF:  °You have shortness of breath while you are resting.   °You have shortness of breath that prevents you from: °Being able to talk.   °Performing your usual physical activities.   °You have chest pain lasting longer than 5 minutes.   °Your skin color is more cyanotic than usual. °You measure low oxygen saturations for longer than 5 minutes with a pulse oximeter. °MAKE SURE YOU:  °Understand these instructions. °Will watch your condition. °Will get help right away if you are not doing well or get worse. °Document Released: 02/21/2005 Document Revised: 09/28/2013 Document Reviewed: 01/08/2013 °[employer]® Patient Information ©2015  [employer]. This information is not intended to replace advice given to you by your health care provider. Make sure you discuss any questions you have with your health care provider. ° °

## 2017-12-29 NOTE — ED Triage Notes (Addendum)
Patient c/o productive cough x 10 days. Seen for same x1 week ago. Reports cough is worsening and preventing sleep. Lungs diminished in triage. Speaking in full sentences. Denies SOB and chest pain.

## 2017-12-29 NOTE — ED Provider Notes (Signed)
Emergency Department Provider Note   I have reviewed the triage vital signs and the nursing notes.   HISTORY  Chief Complaint Cough   HPI Bryan Sullivan is a 72 y.o. male with PMH of COPD and HTN returns to the emergency department with shortness of breath, wheezing, chest tightness.  The patient was seen in the emergency department on 12/22/2017 with similar symptoms.  He was given albuterol, steroid for 5 days, and improved.  He denies any fevers or chills.  He has had a productive cough without hemoptysis.  No abdominal pain, vomiting, or diarrhea.  He does describe some bilateral chest tightness that seems to go along with his shortness of breath.  No radiation of symptoms.  Shortness of breath worse with exertion. Patient tried last neb at noon with only mild improvement.    Past Medical History:  Diagnosis Date  . Asthma    As child   . Enlarged prostate Dx 2001  . Hypertension    Dx at age 76    Patient Active Problem List   Diagnosis Date Noted  . Gastroesophageal reflux disease 10/08/2016  . Glaucoma (increased eye pressure) 12/16/2014  . Lumbar back pain 12/16/2014  . BPH (benign prostatic hyperplasia) 11/24/2014  . DJD (degenerative joint disease) of knee 11/24/2014  . Chronic obstructive airway disease with asthma ([place]) 10/11/2014  . Essential hypertension 09/22/2014    Past Surgical History:  Procedure Laterality Date  . CATARACT EXTRACTION W/ INTRAOCULAR LENS  IMPLANT, BILATERAL Bilateral   . CORNEAL TRANSPLANT Right   . EYE SURGERY      Allergies Patient has no known allergies.  Family History  Problem Relation Age of Onset  . Diabetes Mother   . Asthma Father   . Asthma Sister     Social History Social History   Tobacco Use  . Smoking status: Former Smoker    Types: Cigarettes    Last attempt to quit: 09/21/1968    Years since quitting: 49.3  . Smokeless tobacco: Never Used  . Tobacco comment: "quit smoking cigarettes in 1970"    Substance Use Topics  . Alcohol use: No  . Drug use: No    Review of Systems  Constitutional: No fever/chills Eyes: No visual changes. ENT: No sore throat. Cardiovascular: Positive chest pain/tightness. Respiratory: Positive shortness of breath and wheezing. Gastrointestinal: No abdominal pain.  No nausea, no vomiting.  No diarrhea.  No constipation. Genitourinary: Negative for dysuria. Musculoskeletal: Negative for back pain. Skin: Negative for rash. Neurological: Negative for headaches, focal weakness or numbness.  10-point ROS otherwise negative.  ____________________________________________   PHYSICAL EXAM:  VITAL SIGNS: ED Triage Vitals  Enc Vitals Group     BP 12/29/17 1533 (!) 109/56     Pulse Rate 12/29/17 1533 98     Resp 12/29/17 1533 20     Temp 12/29/17 1533 98.4 F (36.9 C)     Temp Source 12/29/17 1533 Oral     SpO2 12/29/17 1533 99 %     Pain Score 12/29/17 1535 0   Constitutional: Alert and oriented. Well appearing and in no acute distress. Eyes: Conjunctivae are normal.  Head: Atraumatic. Nose: No congestion/rhinnorhea. Mouth/Throat: Mucous membranes are moist. Neck: No stridor.  Cardiovascular: Normal rate, regular rhythm. Good peripheral circulation. Grossly normal heart sounds.   Respiratory: Increased respiratory effort.  No retractions. Lungs with end-expiratory wheezing throughout with decreased air movement.  Gastrointestinal: Soft and nontender. No distention.  Musculoskeletal: No lower extremity tenderness nor edema. No  gross deformities of extremities. Neurologic:  Normal speech and language. No gross focal neurologic deficits are appreciated.  Skin:  Skin is warm, dry and intact. No rash noted.  ____________________________________________   LABS (all labs ordered are listed, but only abnormal results are displayed)  Labs Reviewed  BASIC METABOLIC PANEL - Abnormal; Notable for the following components:      Result Value    Glucose, Bld 103 (*)    BUN 25 (*)    Creatinine, Ser 1.37 (*)    Calcium 8.6 (*)    GFR calc non Af Amer 49 (*)    GFR calc Af Amer 57 (*)    All other components within normal limits  CBC WITH DIFFERENTIAL/PLATELET  I-STAT TROPONIN, ED   ____________________________________________  EKG   EKG Interpretation  Date/Time:  Sunday December 29 2017 17:59:34 EDT Ventricular Rate:  72 PR Interval:    QRS Duration: 97 QT Interval:  388 QTC Calculation: 425 R Axis:   -54 Text Interpretation:  Sinus rhythm Left anterior fascicular block Baseline wander in lead(s) V5 No STEMI. Similar to prior.  Confirmed by Nanda Quinton [id number]) on 12/29/2017 6:05:38 PM       ____________________________________________  RADIOLOGY  Dg Chest 2 View  Result Date: 12/29/2017 CLINICAL DATA:  Productive cough for several days EXAM: CHEST - 2 VIEW COMPARISON:  12/22/2017 FINDINGS: Cardiac shadow is stable. The lungs are hyper aerated bilaterally. No focal infiltrate or sizable effusion is seen. Minimal scarring in the left base is noted. No bony abnormality is seen. IMPRESSION: COPD without acute abnormality. Electronically Signed   By: Inez Catalina M.D.   On: 12/29/2017 15:54    ____________________________________________   PROCEDURES  Procedure(s) performed:   Procedures  None ____________________________________________   INITIAL IMPRESSION / ASSESSMENT AND PLAN / ED COURSE  Pertinent labs & imaging results that were available during my care of the patient were reviewed by me and considered in my medical decision making (see chart for details).  Patient presents to the emergency department for evaluation of dyspnea with wheezing.  Patient with relatively poor air movement.  Seems consistent with COPD however patient is complaining of some chest tightness which again I feel is most likely related to COPD symptoms but will obtain screening labs including troponin and EKG.  Chest x-ray reviewed  with no acute findings.  06:15 PM Patient is feeling better after first nebulizer treatment.  His wheezing has increased but patient seems to be moving better air volume at this time.  Plan for additional nebulizer as well as ambulation with pulse ox for reassessment.   No hypoxemia with ambulation. Continues to feel well. Plan for Additional COPD treatment course and PCP follow up.   At this time, I do not feel there is any life-threatening condition present. I have reviewed and discussed all results (EKG, imaging, lab, urine as appropriate), exam findings with patient. I have reviewed nursing notes and appropriate previous records.  I feel the patient is safe to be discharged home without further emergent workup. Discussed usual and customary return precautions. Patient and family (if present) verbalize understanding and are comfortable with this plan.  Patient will follow-up with their primary care provider. If they do not have a primary care provider, information for follow-up has been provided to them. All questions have been answered.  ____________________________________________  FINAL CLINICAL IMPRESSION(S) / ED DIAGNOSES  Final diagnoses:  COPD exacerbation ([place])  Precordial chest pain     MEDICATIONS GIVEN DURING THIS VISIT:  Medications  methylPREDNISolone sodium succinate (SOLU-MEDROL) 125 mg/2 mL injection 125 mg (125 mg Intravenous Given 12/29/17 1718)  ipratropium-albuterol (DUONEB) 0.5-2.5 (3) MG/3ML nebulizer solution 3 mL (3 mLs Nebulization Given 12/29/17 1718)  ipratropium-albuterol (DUONEB) 0.5-2.5 (3) MG/3ML nebulizer solution 3 mL (3 mLs Nebulization Given 12/29/17 1823)  azithromycin (ZITHROMAX) tablet 500 mg (500 mg Oral Given 12/29/17 1928)  albuterol (PROVENTIL HFA;VENTOLIN HFA) 108 (90 Base) MCG/ACT inhaler 2 puff (2 puffs Inhalation Given 12/29/17 1928)     NEW OUTPATIENT MEDICATIONS STARTED DURING THIS VISIT:  Discharge Medication List as of 12/29/2017  7:26 PM     START taking these medications   Details  !! albuterol (PROVENTIL HFA;VENTOLIN HFA) 108 (90 Base) MCG/ACT inhaler Inhale 1-2 puffs into the lungs every 6 (six) hours as needed for wheezing or shortness of breath., Starting Sun 12/29/2017, Normal    azithromycin (ZITHROMAX) 250 MG tablet Take 1 tablet (250 mg total) by mouth daily. Take first 2 tablets together, then 1 every day until finished., Starting Sun 12/29/2017, Normal     !! - Potential duplicate medications found. Please discuss with provider.      Note:  This document was prepared using Dragon voice recognition software and may include unintentional dictation errors.  Nanda Quinton, MD Emergency Medicine    Jihad Brownlow, [facility], MD 12/30/17 [phone]

## 2017-12-29 NOTE — ED Notes (Signed)
Pt. Ambulated with no assist. Pt. Ambulated from bed to the hallway with O2 sating 97-98%. Pt. Stated no dizziness nor light headedness. Nurse aware.

## 2017-12-30 MED FILL — AZITHROMYCIN 250 MG TABLET: 250 | 5 days supply | Qty: 6 | Fill #0

## 2017-12-30 MED FILL — !VENTOLIN HFA INHALER: 108 (90 BAS | 25 days supply | Qty: 18 | Fill #0

## 2017-12-30 MED FILL — predniSONE 10 MG TABS: 10 | 4 days supply | Qty: 8 | Fill #0

## 2017-12-31 MED FILL — FLUOROMETHOLONE 0.1% DROPS: 0.1 | 25 days supply | Qty: 5 | Fill #2

## 2018-01-01 ENCOUNTER — Ambulatory Visit: Payer: Self-pay | Attending: Nurse Practitioner | Admitting: Nurse Practitioner

## 2018-01-01 ENCOUNTER — Encounter: Payer: Self-pay | Admitting: Nurse Practitioner

## 2018-01-01 VITALS — BP 123/77 | HR 97 | Temp 98.7°F | Resp 16 | Ht 70.0 in | Wt 180.2 lb

## 2018-01-01 DIAGNOSIS — Z7952 Long term (current) use of systemic steroids: Secondary | ICD-10-CM | POA: Insufficient documentation

## 2018-01-01 DIAGNOSIS — Z9842 Cataract extraction status, left eye: Secondary | ICD-10-CM | POA: Insufficient documentation

## 2018-01-01 DIAGNOSIS — Z79899 Other long term (current) drug therapy: Secondary | ICD-10-CM | POA: Insufficient documentation

## 2018-01-01 DIAGNOSIS — K219 Gastro-esophageal reflux disease without esophagitis: Secondary | ICD-10-CM | POA: Insufficient documentation

## 2018-01-01 DIAGNOSIS — I10 Essential (primary) hypertension: Secondary | ICD-10-CM | POA: Insufficient documentation

## 2018-01-01 DIAGNOSIS — J449 Chronic obstructive pulmonary disease, unspecified: Secondary | ICD-10-CM

## 2018-01-01 DIAGNOSIS — Z9841 Cataract extraction status, right eye: Secondary | ICD-10-CM | POA: Insufficient documentation

## 2018-01-01 DIAGNOSIS — N400 Enlarged prostate without lower urinary tract symptoms: Secondary | ICD-10-CM | POA: Insufficient documentation

## 2018-01-01 DIAGNOSIS — Z947 Corneal transplant status: Secondary | ICD-10-CM | POA: Insufficient documentation

## 2018-01-01 DIAGNOSIS — Z833 Family history of diabetes mellitus: Secondary | ICD-10-CM | POA: Insufficient documentation

## 2018-01-01 DIAGNOSIS — J441 Chronic obstructive pulmonary disease with (acute) exacerbation: Secondary | ICD-10-CM | POA: Insufficient documentation

## 2018-01-01 DIAGNOSIS — Z7951 Long term (current) use of inhaled steroids: Secondary | ICD-10-CM | POA: Insufficient documentation

## 2018-01-01 DIAGNOSIS — Z825 Family history of asthma and other chronic lower respiratory diseases: Secondary | ICD-10-CM | POA: Insufficient documentation

## 2018-01-01 DIAGNOSIS — Z9889 Other specified postprocedural states: Secondary | ICD-10-CM | POA: Insufficient documentation

## 2018-01-01 MED ORDER — UMECLIDINIUM-VILANTEROL 62.5-25 MCG/INH IN AEPB: 1.0000 | INHALATION_SPRAY | Freq: Every day | RESPIRATORY_TRACT | 3 refills | Status: DC

## 2018-01-01 MED ORDER — OMEPRAZOLE 20 MG PO CPDR: 20.0000 mg | DELAYED_RELEASE_CAPSULE | Freq: Every day | ORAL | 1 refills | Status: DC

## 2018-01-01 MED ORDER — DEXTROMETHORPHAN-GUAIFENESIN 10-100 MG/5ML PO SYRP: 10.0000 mL | ORAL_SOLUTION | Freq: Two times a day (BID) | ORAL | 0 refills | Status: AC

## 2018-01-01 MED ORDER — PREDNISONE 20 MG PO TABS: 60.0000 mg | ORAL_TABLET | Freq: Every day | ORAL | 0 refills | Status: AC

## 2018-01-01 MED ORDER — IPRATROPIUM-ALBUTEROL 0.5-2.5 (3) MG/3ML IN SOLN
3.0000 mL | Freq: Once | RESPIRATORY_TRACT | Status: AC
  Administered 2018-01-01: 3 mL via RESPIRATORY_TRACT

## 2018-01-01 MED ORDER — FLUTICASONE-SALMETEROL 100-50 MCG/DOSE IN AEPB: 1.0000 | INHALATION_SPRAY | Freq: Two times a day (BID) | RESPIRATORY_TRACT | 3 refills | Status: DC

## 2018-01-01 MED FILL — LOSARTAN POTASSIUM 100 MG T: 100 | 30 days supply | Qty: 30 | Fill #1

## 2018-01-01 MED FILL — OMEPRAZOLE 20 MG CAP: 20 | 30 days supply | Qty: 30 | Fill #0

## 2018-01-01 MED FILL — AMLODIPINE BESYLATE 5 MG TA: 5 | 30 days supply | Qty: 30 | Fill #5

## 2018-01-01 MED FILL — TAMSULOSIN HCL 0.4 MG CAP: 0.4 | 30 days supply | Qty: 30 | Fill #3

## 2018-01-01 MED FILL — FINASTERIDE 5 MG TABLET: 5 | 30 days supply | Qty: 30 | Fill #0

## 2018-01-01 MED FILL — EXTRA ACTION COUGH 10-100 M: 10-100 | 11 days supply | Qty: 236 | Fill #0

## 2018-01-01 NOTE — Progress Notes (Signed)
Assessment & Plan:  Duff was seen today for follow-up.  Diagnoses and all orders for this visit:  COPD with acute exacerbation ([place]) -     ipratropium-albuterol (DUONEB) 0.5-2.5 (3) MG/3ML nebulizer solution 3 mL -     umeclidinium-vilanterol (ANORO ELLIPTA) 62.5-25 MCG/INH AEPB; Inhale 1 puff into the lungs daily. -     Dextromethorphan-guaiFENesin (TUSSIN DM) 10-100 MG/5ML liquid; Take 10 mLs by mouth every 12 (twelve) hours for 10 days. -     predniSONE (DELTASONE) 20 MG tablet; Take 3 tablets (60 mg total) by mouth daily with breakfast for 7 days. -     Ambulatory referral to Pulmonology  Gastroesophageal reflux disease, esophagitis presence not specified -     omeprazole (PRILOSEC) 20 MG capsule; Take 1 capsule (20 mg total) by mouth daily. Chronic. Well controlled with omeprazole.   Chronic obstructive airway disease with asthma (HCC) -     Fluticasone-Salmeterol (ADVAIR) 100-50 MCG/DOSE AEPB; Inhale 1 puff into the lungs 2 (two) times daily.    Patient has been counseled on age-appropriate routine health concerns for screening and prevention. These are reviewed and up-to-date. Referrals have been placed accordingly. Immunizations are up-to-date or declined.    Subjective:   Chief Complaint  Patient presents with  . Follow-up    Pt. is here to follow-up.    HPI Bryan Sullivan 72 y.o. male presents to office today for follow up to COPD and GERD. He is also having an exacerbation of his COPD today.   COPD/Asthma He has been seen in the ED on 2 separate occasions: 12-22-2017 and 12-29-2017. Both times he has been given prednisone and nebulizer treatments as well as solumedrol and azithromycin at his last ED visit. CXR normal on both occasions. He has been on QVAR and advair. Today he reports little improvement in his symtpoms including chest tightness, cough keeping him up at night along with wheezing.  Will swtich to advair and anoro ellipta. He will need to see  Pulmonology for further evaluation. He required a nebulizer treatment today here in the office. There was some improvement in air flow along with reduction of wheezing and patient endorses improvement.  I have also increased his prednisone dosage for several days.   Review of Systems  Constitutional: Negative for fever, malaise/fatigue and weight loss.  HENT: Negative.  Negative for nosebleeds.   Eyes: Negative.  Negative for blurred vision, double vision and photophobia.  Respiratory: Positive for cough, shortness of breath and wheezing.   Cardiovascular: Positive for chest pain. Negative for palpitations and leg swelling.       Chest tightness  Gastrointestinal: Positive for heartburn. Negative for nausea and vomiting.  Musculoskeletal: Negative.  Negative for myalgias.  Neurological: Negative.  Negative for dizziness, focal weakness, seizures and headaches.  Psychiatric/Behavioral: Negative.  Negative for suicidal ideas.    Past Medical History:  Diagnosis Date  . Asthma    As child   . Enlarged prostate Dx 2001  . Hypertension    Dx at age 33    Past Surgical History:  Procedure Laterality Date  . CATARACT EXTRACTION W/ INTRAOCULAR LENS  IMPLANT, BILATERAL Bilateral   . CORNEAL TRANSPLANT Right   . EYE SURGERY      Family History  Problem Relation Age of Onset  . Diabetes Mother   . Asthma Father   . Asthma Sister     Social History Reviewed with no changes to be made today.   Outpatient Medications Prior to Visit  Medication Sig Dispense Refill  . albuterol (PROAIR HFA) 108 (90 Base) MCG/ACT inhaler INHALE 2 PUFFS INTO THE LUNGS EVERY 2 HOURS AS NEEDED FOR WHEEZING, SHORTNESS OF BREATH, OR COUGH. 8.5 g 2  . albuterol (PROVENTIL HFA;VENTOLIN HFA) 108 (90 Base) MCG/ACT inhaler Inhale 1-2 puffs into the lungs every 6 (six) hours as needed for wheezing or shortness of breath. 1 Inhaler 0  . albuterol (PROVENTIL) (2.5 MG/3ML) 0.083% nebulizer solution USE 3 MLS (2.5 MG  TOTAL) BY NEBULIZATION EVERY 6 (SIX) HOURS AS NEEDED FOR WHEEZING OR SHORTNESS OF BREATH. 1080 mL 1  . amLODipine (NORVASC) 5 MG tablet Take 1 tablet (5 mg total) by mouth daily. 90 tablet 3  . azithromycin (ZITHROMAX) 250 MG tablet Take 1 tablet (250 mg total) by mouth daily. Take first 2 tablets together, then 1 every day until finished. 6 tablet 0  . brimonidine (ALPHAGAN) 0.2 % ophthalmic solution Place 1 drop into the right eye 3 (three) times daily. 15 mL 3  . Cholecalciferol (VITAMIN D-1000 MAX ST) 1000 units tablet Take 1,000 Units by mouth daily.    . dorzolamide (TRUSOPT) 2 % ophthalmic solution Place 1 drop into the right eye 3 (three) times daily. 30 mL 3  . finasteride (PROSCAR) 5 MG tablet Take 1 tablet (5 mg total) by mouth daily. 90 tablet 1  . fluorometholone (FML) 0.1 % ophthalmic suspension Place 1 drop into both eyes daily. 15 mL 3  . latanoprost (XALATAN) 0.005 % ophthalmic solution Place 1 drop into both eyes at bedtime. 7.5 mL 3  . losartan (COZAAR) 100 MG tablet Take 1 tablet (100 mg total) by mouth daily. 90 tablet 3  . tamsulosin (FLOMAX) 0.4 MG CAPS capsule Take 1 capsule (0.4 mg total) by mouth daily. 30 capsule 11  . timolol (TIMOPTIC) 0.5 % ophthalmic solution Place 1 drop into the right eye 2 (two) times daily. 15 mL 3  . beclomethasone (QVAR) 80 MCG/ACT inhaler Inhale 2 puffs into the lungs 2 (two) times daily. 3 Inhaler 3  . Fluticasone-Salmeterol (ADVAIR) 100-50 MCG/DOSE AEPB Inhale 1 puff into the lungs 2 (two) times daily. 1 each 3  . predniSONE (DELTASONE) 10 MG tablet Take 2 tablets (20 mg total) by mouth daily for 4 days. 8 tablet 0  . fluticasone (FLONASE) 50 MCG/ACT nasal spray Place 2 sprays into both nostrils daily. (Patient not taking: Reported on 09/25/2017) 16 g 2  . omeprazole (PRILOSEC) 20 MG capsule Take 1 capsule (20 mg total) by mouth daily. 90 capsule 1   No facility-administered medications prior to visit.     No Known Allergies       Objective:    BP 123/77 (BP Location: Left Arm, Patient Position: Sitting, Cuff Size: Normal)   Pulse 97   Temp 98.7 F (37.1 C) (Oral)   Resp 16   Ht 5\' 10"  (1.778 m)   Wt 180 lb 3.2 oz (81.7 kg)   SpO2 98%   BMI 25.86 kg/m  Wt Readings from Last 3 Encounters:  01/01/18 180 lb 3.2 oz (81.7 kg)  09/25/17 181 lb (82.1 kg)  08/07/17 180 lb (81.6 kg)    Physical Exam  Constitutional: He is oriented to person, place, and time. He appears well-developed and well-nourished. He is cooperative.  HENT:  Head: Normocephalic and atraumatic.  Eyes: EOM are normal.  Neck: Normal range of motion.  Cardiovascular: Normal rate, regular rhythm and normal heart sounds. Exam reveals no gallop and no friction rub.  No murmur  heard. Pulmonary/Chest: No accessory muscle usage or stridor. Tachypnea noted. No respiratory distress. He has no decreased breath sounds. He has wheezes in the right upper field, the right middle field, the right lower field, the left upper field, the left middle field and the left lower field. He has no rhonchi. He has no rales. He exhibits no tenderness.  Abdominal: Bowel sounds are normal.  Musculoskeletal: Normal range of motion. He exhibits no edema.  Neurological: He is alert and oriented to person, place, and time. Coordination normal.  Skin: Skin is warm and dry.  Psychiatric: He has a normal mood and affect. His behavior is normal. Judgment and thought content normal.  Nursing note and vitals reviewed.      Patient has been counseled extensively about nutrition and exercise as well as the importance of adherence with medications and regular follow-up. The patient was given clear instructions to go to ER or return to medical center if symptoms don't improve, worsen or new problems develop. The patient verbalized understanding.   Follow-up: Return in about 2 weeks (around 01/15/2018) for See below: F/u with me in 2 weeks for COPD exacerbation.   Gildardo Pounds,  FNP-BC [facility] [place]   01/01/2018, 10:49 PM

## 2018-01-01 NOTE — Patient Instructions (Signed)
Chronic Obstructive Pulmonary Disease Exacerbation  Chronic obstructive pulmonary disease (COPD) is a common lung problem. In COPD, the flow of air from the lungs is limited. COPD exacerbations are times that breathing gets worse and you need extra treatment. Without treatment they can be life threatening. If they happen often, your lungs can become more damaged. If your COPD gets worse, your doctor may treat you with:  ? Medicines.  ? Oxygen.  ? Different ways to clear your airway, such as using a mask.    Follow these instructions at home:  ? Do not smoke.  ? Avoid tobacco smoke and other things that bother your lungs.  ? If given, take your antibiotic medicine as told. Finish the medicine even if you start to feel better.  ? Only take medicines as told by your doctor.  ? Drink enough fluids to keep your pee (urine) clear or pale yellow (unless your doctor has told you not to).  ? Use a cool mist machine (vaporizer).  ? If you use oxygen or a machine that turns liquid medicine into a mist (nebulizer), continue to use them as told.  ? Keep up with shots (vaccinations) as told by your doctor.  ? Exercise regularly.  ? Eat healthy foods.  ? Keep all doctor visits as told.  Get help right away if:  ? You are very short of breath and it gets worse.  ? You have trouble talking.  ? You have bad chest pain.  ? You have blood in your spit (sputum).  ? You have a fever.  ? You keep throwing up (vomiting).  ? You feel weak, or you pass out (faint).  ? You feel confused.  ? You keep getting worse.  This information is not intended to replace advice given to you by your health care provider. Make sure you discuss any questions you have with your health care provider.  Document Released: 05/03/2011 Document Revised: 10/20/2015 Document Reviewed: 01/16/2013  Elsevier Interactive Patient Education ? 2017 [employer] Inc[employer]

## 2018-01-05 ENCOUNTER — Encounter (HOSPITAL_COMMUNITY): Payer: Self-pay | Admitting: Emergency Medicine

## 2018-01-05 ENCOUNTER — Emergency Department (HOSPITAL_COMMUNITY)
Admission: EM | Admit: 2018-01-05 | Discharge: 2018-01-06 | Disposition: A | Discharge status: Home / Self Care | Payer: Self-pay | Attending: Emergency Medicine | Admitting: Emergency Medicine

## 2018-01-05 DIAGNOSIS — J441 Chronic obstructive pulmonary disease with (acute) exacerbation: Secondary | ICD-10-CM | POA: Insufficient documentation

## 2018-01-05 DIAGNOSIS — Z87891 Personal history of nicotine dependence: Secondary | ICD-10-CM | POA: Insufficient documentation

## 2018-01-05 DIAGNOSIS — I10 Essential (primary) hypertension: Secondary | ICD-10-CM | POA: Insufficient documentation

## 2018-01-05 DIAGNOSIS — Z79899 Other long term (current) drug therapy: Secondary | ICD-10-CM | POA: Insufficient documentation

## 2018-01-05 LAB — URINALYSIS, ROUTINE W REFLEX MICROSCOPIC
Bilirubin Urine: NEGATIVE
Glucose, UA: NEGATIVE mg/dL
Hgb urine dipstick: NEGATIVE
Ketones, ur: NEGATIVE mg/dL
Leukocytes, UA: NEGATIVE
Nitrite: NEGATIVE
Protein, ur: NEGATIVE mg/dL
Specific Gravity, Urine: 1.019 (ref 1.005–1.030)
pH: 5 (ref 5.0–8.0)

## 2018-01-05 LAB — CBC
HCT: 39.4 % (ref 39.0–52.0)
Hemoglobin: 12.6 g/dL — ABNORMAL LOW (ref 13.0–17.0)
MCH: 27.8 pg (ref 26.0–34.0)
MCHC: 32 g/dL (ref 30.0–36.0)
MCV: 86.8 fL (ref 78.0–100.0)
Platelets: 295 10*3/uL (ref 150–400)
RBC: 4.54 MIL/uL (ref 4.22–5.81)
RDW: 12.7 % (ref 11.5–15.5)
WBC: 12.3 10*3/uL — ABNORMAL HIGH (ref 4.0–10.5)

## 2018-01-05 LAB — BASIC METABOLIC PANEL
Anion gap: 11 (ref 5–15)
BUN: 25 mg/dL — ABNORMAL HIGH (ref 8–23)
CO2: 23 mmol/L (ref 22–32)
Calcium: 8.5 mg/dL — ABNORMAL LOW (ref 8.9–10.3)
Chloride: 102 mmol/L (ref 98–111)
Creatinine, Ser: 1.42 mg/dL — ABNORMAL HIGH (ref 0.61–1.24)
GFR calc Af Amer: 55 mL/min — ABNORMAL LOW (ref >60)
GFR calc non Af Amer: 47 mL/min — ABNORMAL LOW (ref >60)
Glucose, Bld: 120 mg/dL — ABNORMAL HIGH (ref 70–99)
Potassium: 3.8 mmol/L (ref 3.5–5.1)
Sodium: 136 mmol/L (ref 135–145)

## 2018-01-05 NOTE — ED Triage Notes (Signed)
Pt reports continued chest congestion and cough despite taking mediation.  Pt reports fever and chills at home, has been taking Motrin about 1930.  Painful and often urination.

## 2018-01-06 ENCOUNTER — Emergency Department (HOSPITAL_COMMUNITY): Payer: Self-pay

## 2018-01-06 ENCOUNTER — Encounter (HOSPITAL_COMMUNITY): Payer: Self-pay | Admitting: Emergency Medicine

## 2018-01-06 LAB — I-STAT TROPONIN, ED: Troponin i, poc: 0.01 ng/mL (ref 0.00–0.08)

## 2018-01-06 LAB — MAGNESIUM: Magnesium: 2.1 mg/dL (ref 1.7–2.4)

## 2018-01-06 MED ORDER — ALBUTEROL SULFATE (2.5 MG/3ML) 0.083% IN NEBU
5.0000 mg | INHALATION_SOLUTION | Freq: Once | RESPIRATORY_TRACT | Status: AC
  Administered 2018-01-06: 5 mg via RESPIRATORY_TRACT
  Filled 2018-01-06: qty 6

## 2018-01-06 MED ORDER — PREDNISONE 20 MG PO TABS: ORAL_TABLET | ORAL | 0 refills | Status: DC

## 2018-01-06 MED ORDER — ACETAMINOPHEN 500 MG PO TABS
1000.0000 mg | ORAL_TABLET | Freq: Once | ORAL | Status: AC
  Administered 2018-01-06: 1000 mg via ORAL
  Filled 2018-01-06: qty 2

## 2018-01-06 MED ORDER — DOXYCYCLINE HYCLATE 100 MG PO CAPS: 100.0000 mg | ORAL_CAPSULE | Freq: Two times a day (BID) | ORAL | 0 refills | Status: DC

## 2018-01-06 MED ORDER — PHENAZOPYRIDINE HCL 100 MG PO TABS
200.0000 mg | ORAL_TABLET | Freq: Once | ORAL | Status: AC
  Administered 2018-01-06: 200 mg via ORAL
  Filled 2018-01-06: qty 2

## 2018-01-06 MED ORDER — MAGNESIUM SULFATE 2 GM/50ML IV SOLN
2.0000 g | Freq: Once | INTRAVENOUS | Status: AC
  Administered 2018-01-06: 2 g via INTRAVENOUS
  Filled 2018-01-06: qty 50

## 2018-01-06 MED ORDER — DOXYCYCLINE HYCLATE 100 MG PO TABS
100.0000 mg | ORAL_TABLET | Freq: Once | ORAL | Status: AC
  Administered 2018-01-06: 100 mg via ORAL
  Filled 2018-01-06: qty 1

## 2018-01-06 MED ORDER — IPRATROPIUM BROMIDE 0.02 % IN SOLN
0.5000 mg | Freq: Once | RESPIRATORY_TRACT | Status: AC
Start: 2018-01-06 — End: 2018-01-06
  Administered 2018-01-06: 0.5 mg via RESPIRATORY_TRACT
  Filled 2018-01-06: qty 2.5

## 2018-01-06 MED ORDER — METHYLPREDNISOLONE SODIUM SUCC 125 MG IJ SOLR
125.0000 mg | Freq: Once | INTRAMUSCULAR | Status: AC
  Administered 2018-01-06: 125 mg via INTRAVENOUS
  Filled 2018-01-06: qty 2

## 2018-01-06 MED FILL — predniSONE 20 MG TABS: 20 | 5 days supply | Qty: 11 | Fill #0

## 2018-01-06 MED FILL — DOXYCYCLINE HYCLATE 100 MG: 100 | 7 days supply | Qty: 14 | Fill #0

## 2018-01-06 NOTE — ED Notes (Signed)
Bladder scan 215mL

## 2018-01-06 NOTE — ED Notes (Signed)
Bladder scan: 91 mL

## 2018-01-06 NOTE — ED Notes (Signed)
Signature pad unavailable at time of pt discharge. Pt verbalized understanding.  

## 2018-01-06 NOTE — ED Provider Notes (Signed)
[facility] EMERGENCY DEPARTMENT Provider Note   CSN: [id number] Arrival date & time: 01/05/18  2104     History   Chief Complaint Chief Complaint  Patient presents with  . congestion  . Urinary Retention    HPI Bryan Sullivan is a 72 y.o. male.  The history is provided by the patient.  Cough  This is a recurrent problem. The current episode started more than 2 days ago. The problem occurs constantly. The problem has been gradually worsening. The cough is non-productive. There has been no fever. Associated symptoms include wheezing. Pertinent negatives include no chest pain, no sweats and no weight loss. Associated symptoms comments: Chest congestion. Treatments tried: albuterol nebs  The treatment provided no relief. Risk factors: none. He is not a smoker. His past medical history is significant for COPD.  Also reports he is not urinating as often as usual.  Felt feverish but did not take his temperature.    Past Medical History:  Diagnosis Date  . Asthma    As child   . Enlarged prostate Dx 2001  . Hypertension    Dx at age 57    Patient Active Problem List   Diagnosis Date Noted  . Gastroesophageal reflux disease 10/08/2016  . Glaucoma (increased eye pressure) 12/16/2014  . Lumbar back pain 12/16/2014  . BPH (benign prostatic hyperplasia) 11/24/2014  . DJD (degenerative joint disease) of knee 11/24/2014  . Chronic obstructive airway disease with asthma ([place]) 10/11/2014  . Essential hypertension 09/22/2014    Past Surgical History:  Procedure Laterality Date  . CATARACT EXTRACTION W/ INTRAOCULAR LENS  IMPLANT, BILATERAL Bilateral   . CORNEAL TRANSPLANT Right   . EYE SURGERY          Home Medications    Prior to Admission medications   Medication Sig Start Date End Date Taking? Authorizing Provider  albuterol (PROAIR HFA) 108 (90 Base) MCG/ACT inhaler INHALE 2 PUFFS INTO THE LUNGS EVERY 2 HOURS AS NEEDED FOR WHEEZING, SHORTNESS OF  BREATH, OR COUGH. 12/18/16   Tresa Garter, MD  albuterol (PROVENTIL HFA;VENTOLIN HFA) 108 (90 Base) MCG/ACT inhaler Inhale 1-2 puffs into the lungs every 6 (six) hours as needed for wheezing or shortness of breath. 12/29/17   Long, Wonda Olds, MD  albuterol (PROVENTIL) (2.5 MG/3ML) 0.083% nebulizer solution USE 3 MLS (2.5 MG TOTAL) BY NEBULIZATION EVERY 6 (SIX) HOURS AS NEEDED FOR WHEEZING OR SHORTNESS OF BREATH. 12/17/16   Funches, Adriana Mccallum, MD  amLODipine (NORVASC) 5 MG tablet Take 1 tablet (5 mg total) by mouth daily. 09/25/17   Gildardo Pounds, NP  azithromycin (ZITHROMAX) 250 MG tablet Take 1 tablet (250 mg total) by mouth daily. Take first 2 tablets together, then 1 every day until finished. 12/29/17   Long, Wonda Olds, MD  brimonidine (ALPHAGAN) 0.2 % ophthalmic solution Place 1 drop into the right eye 3 (three) times daily. 09/25/17 09/25/18  Gildardo Pounds, NP  Cholecalciferol (VITAMIN D-1000 MAX ST) 1000 units tablet Take 1,000 Units by mouth daily. 07/23/16   [provider]  Dextromethorphan-guaiFENesin (TUSSIN DM) 10-100 MG/5ML liquid Take 10 mLs by mouth every 12 (twelve) hours for 10 days. 01/01/18 01/11/18  Gildardo Pounds, NP  dorzolamide (TRUSOPT) 2 % ophthalmic solution Place 1 drop into the right eye 3 (three) times daily. 09/25/17   Gildardo Pounds, NP  finasteride (PROSCAR) 5 MG tablet Take 1 tablet (5 mg total) by mouth daily. 09/25/17   Gildardo Pounds, NP  fluorometholone (  FML) 0.1 % ophthalmic suspension Place 1 drop into both eyes daily. 09/25/17   Gildardo Pounds, NP  fluticasone (FLONASE) 50 MCG/ACT nasal spray Place 2 sprays into both nostrils daily. Patient not taking: Reported on 09/25/2017 07/26/16   Argentina Donovan, PA-C  Fluticasone-Salmeterol (ADVAIR) 100-50 MCG/DOSE AEPB Inhale 1 puff into the lungs 2 (two) times daily. 01/01/18   Gildardo Pounds, NP  latanoprost (XALATAN) 0.005 % ophthalmic solution Place 1 drop into both eyes at bedtime. 09/25/17   Gildardo Pounds, NP    losartan (COZAAR) 100 MG tablet Take 1 tablet (100 mg total) by mouth daily. 09/25/17   Gildardo Pounds, NP  omeprazole (PRILOSEC) 20 MG capsule Take 1 capsule (20 mg total) by mouth daily. 01/01/18 04/01/18  Gildardo Pounds, NP  predniSONE (DELTASONE) 20 MG tablet Take 3 tablets (60 mg total) by mouth daily with breakfast for 7 days. 01/01/18 01/08/18  Gildardo Pounds, NP  tamsulosin (FLOMAX) 0.4 MG CAPS capsule Take 1 capsule (0.4 mg total) by mouth daily. 09/25/17   Gildardo Pounds, NP  timolol (TIMOPTIC) 0.5 % ophthalmic solution Place 1 drop into the right eye 2 (two) times daily. 09/25/17   Gildardo Pounds, NP  umeclidinium-vilanterol (ANORO ELLIPTA) 62.5-25 MCG/INH AEPB Inhale 1 puff into the lungs daily. 01/01/18   Gildardo Pounds, NP    Family History Family History  Problem Relation Age of Onset  . Diabetes Mother   . Asthma Father   . Asthma Sister     Social History Social History   Tobacco Use  . Smoking status: Former Smoker    Types: Cigarettes    Last attempt to quit: 09/21/1968    Years since quitting: 49.3  . Smokeless tobacco: Never Used  . Tobacco comment: "quit smoking cigarettes in 1970"  Substance Use Topics  . Alcohol use: No  . Drug use: No     Allergies   Patient has no known allergies.   Review of Systems Review of Systems  Constitutional: Negative for diaphoresis and weight loss.  Eyes: Negative for photophobia.  Respiratory: Positive for cough and wheezing. Negative for stridor.   Cardiovascular: Negative for chest pain, palpitations and leg swelling.  Gastrointestinal: Negative for abdominal pain.  Genitourinary: Positive for difficulty urinating.  All other systems reviewed and are negative.    Physical Exam Updated Vital Signs BP 119/60 (BP Location: Right Arm)   Pulse (!) 116   Temp 99.1 F (37.3 C) (Oral)   Resp 18   SpO2 96%   Physical Exam  Constitutional: He is oriented to person, place, and time. He appears well-developed and  well-nourished. No distress.  HENT:  Head: Normocephalic and atraumatic.  Mouth/Throat: No oropharyngeal exudate.  Eyes: Pupils are equal, round, and reactive to light. Conjunctivae are normal.  Neck: Normal range of motion. Neck supple.  Cardiovascular: Normal rate, regular rhythm, normal heart sounds and intact distal pulses.  Pulmonary/Chest: Tachypnea noted. He has decreased breath sounds. He has wheezes. He has no rales.  Abdominal: Soft. Bowel sounds are normal. He exhibits no mass. There is no tenderness. There is no rebound and no guarding.  Musculoskeletal: Normal range of motion. He exhibits no edema or tenderness.  Lymphadenopathy:    He has no cervical adenopathy.  Neurological: He is alert and oriented to person, place, and time.  Skin: Skin is warm and dry. Capillary refill takes less than 2 seconds.  Psychiatric: He has a normal mood and affect.  ED Treatments / Results  Labs (all labs ordered are listed, but only abnormal results are displayed) Results for orders placed or performed during the hospital encounter of 01/05/18  Urinalysis, Routine w reflex microscopic- may I&O cath if menses  Result Value Ref Range   Color, Urine YELLOW YELLOW   APPearance CLEAR CLEAR   Specific Gravity, Urine 1.019 1.005 - 1.030   pH 5.0 5.0 - 8.0   Glucose, UA NEGATIVE NEGATIVE mg/dL   Hgb urine dipstick NEGATIVE NEGATIVE   Bilirubin Urine NEGATIVE NEGATIVE   Ketones, ur NEGATIVE NEGATIVE mg/dL   Protein, ur NEGATIVE NEGATIVE mg/dL   Nitrite NEGATIVE NEGATIVE   Leukocytes, UA NEGATIVE NEGATIVE  Basic metabolic panel  Result Value Ref Range   Sodium 136 135 - 145 mmol/L   Potassium 3.8 3.5 - 5.1 mmol/L   Chloride 102 98 - 111 mmol/L   CO2 23 22 - 32 mmol/L   Glucose, Bld 120 (H) 70 - 99 mg/dL   BUN 25 (H) 8 - 23 mg/dL   Creatinine, Ser 1.42 (H) 0.61 - 1.24 mg/dL   Calcium 8.5 (L) 8.9 - 10.3 mg/dL   GFR calc non Af Amer 47 (L) >60 mL/min   GFR calc Af Amer 55 (L) >60  mL/min   Anion gap 11 5 - 15  CBC  Result Value Ref Range   WBC 12.3 (H) 4.0 - 10.5 K/uL   RBC 4.54 4.22 - 5.81 MIL/uL   Hemoglobin 12.6 (L) 13.0 - 17.0 g/dL   HCT 39.4 39.0 - 52.0 %   MCV 86.8 78.0 - 100.0 fL   MCH 27.8 26.0 - 34.0 pg   MCHC 32.0 30.0 - 36.0 g/dL   RDW 12.7 11.5 - 15.5 %   Platelets 295 150 - 400 K/uL  Magnesium  Result Value Ref Range   Magnesium 2.1 1.7 - 2.4 mg/dL  I-stat troponin, ED  Result Value Ref Range   Troponin i, poc 0.01 0.00 - 0.08 ng/mL   Comment 3           Dg Chest 2 View  Result Date: 12/29/2017 CLINICAL DATA:  Productive cough for several days EXAM: CHEST - 2 VIEW COMPARISON:  12/22/2017 FINDINGS: Cardiac shadow is stable. The lungs are hyper aerated bilaterally. No focal infiltrate or sizable effusion is seen. Minimal scarring in the left base is noted. No bony abnormality is seen. IMPRESSION: COPD without acute abnormality. Electronically Signed   By: Inez Catalina M.D.   On: 12/29/2017 15:54   Dg Chest 2 View  Result Date: 12/22/2017 CLINICAL DATA:  Shortness of breath EXAM: CHEST - 2 VIEW COMPARISON:  07/04/2017 FINDINGS: Mild hyperinflation. Heart and mediastinal contours are within normal limits. No focal opacities or effusions. No acute bony abnormality. IMPRESSION: Hyperinflation.  No active disease. Electronically Signed   By: Rolm Baptise M.D.   On: 12/22/2017 19:06   Dg Chest Portable 1 View  Result Date: 01/06/2018 CLINICAL DATA:  Cough and asthma tonight. EXAM: PORTABLE CHEST 1 VIEW COMPARISON:  12/29/2017 FINDINGS: Emphysematous changes in the upper lungs. Normal heart size and pulmonary vascularity. No focal airspace disease or consolidation in the lungs. No blunting of costophrenic angles. No pneumothorax. Mediastinal contours appear intact. IMPRESSION: Emphysematous changes in the lungs. No evidence of active pulmonary disease. Electronically Signed   By: Lucienne Capers M.D.   On: 01/06/2018 00:45    EKG  EKG  Interpretation  Date/Time:  Monday January 06 2018 01:02:23 EDT Ventricular Rate:  68  PR Interval:    QRS Duration: 100 QT Interval:  384 QTC Calculation: 432 R Axis:   -48 Text Interpretation:  Sinus rhythm Atrial premature complex Left anterior fascicular block Confirmed by Randal Buba, Trenna Kiely ([id number]) on 01/06/2018 1:33:43 AM        Procedures Procedures (including critical care time)  Medications Ordered in ED Medications  methylPREDNISolone sodium succinate (SOLU-MEDROL) 125 mg/2 mL injection 125 mg (has no administration in time range)  magnesium sulfate IVPB 2 g 50 mL (has no administration in time range)  albuterol (PROVENTIL) (2.5 MG/3ML) 0.083% nebulizer solution 5 mg (5 mg Nebulization Given 01/06/18 0057)  ipratropium (ATROVENT) nebulizer solution 0.5 mg (0.5 mg Nebulization Given 01/06/18 0057)  acetaminophen (TYLENOL) tablet 1,000 mg (1,000 mg Oral Given 01/06/18 0052)     Is urinating it just hurts per the patient. There is no infection in the urine.  Will d/cr with steroids and have patient follow up with urology for ongoing care.    Final Clinical Impressions(s) / ED Diagnoses   Return for numbness, changes in vision or speech, fevers >100.4 unrelieved by medication, shortness of breath, intractable vomiting, or diarrhea, abdominal pain, Inability to tolerate liquids or food, cough, altered mental status or any concerns. No signs of systemic illness or infection. The patient is nontoxic-appearing on exam and vital signs are within normal limits. Will refer to urology for microscopy hematuria as patient is asymptomatic.  I have reviewed the triage vital signs and the nursing notes. Pertinent labs &imaging results that were available during my care of the patient were reviewed by me and considered in my medical decision making (see chart for details).  After history, exam, and medical workup I feel the patient has been appropriately medically screened and is safe for  discharge home. Pertinent diagnoses were discussed with the patient. Patient was given return precautions.   Vaneza Pickart, MD 01/06/18 [phone]

## 2018-01-06 NOTE — ED Notes (Signed)
X-ray at bedside

## 2018-01-06 NOTE — ED Notes (Signed)
ED Provider at bedside. 

## 2018-01-07 ENCOUNTER — Other Ambulatory Visit: Payer: Self-pay | Admitting: Pharmacist

## 2018-01-08 MED FILL — predniSONE 20 MG TABS: 20 | 7 days supply | Qty: 21 | Fill #0

## 2018-01-15 ENCOUNTER — Encounter: Payer: Self-pay | Admitting: Nurse Practitioner

## 2018-01-15 ENCOUNTER — Ambulatory Visit: Payer: Self-pay | Attending: Nurse Practitioner | Admitting: Nurse Practitioner

## 2018-01-15 VITALS — BP 117/75 | HR 90 | Temp 98.5°F | Resp 14 | Ht 70.0 in | Wt 180.8 lb

## 2018-01-15 DIAGNOSIS — N401 Enlarged prostate with lower urinary tract symptoms: Secondary | ICD-10-CM | POA: Insufficient documentation

## 2018-01-15 DIAGNOSIS — Z792 Long term (current) use of antibiotics: Secondary | ICD-10-CM | POA: Insufficient documentation

## 2018-01-15 DIAGNOSIS — Z79899 Other long term (current) drug therapy: Secondary | ICD-10-CM | POA: Insufficient documentation

## 2018-01-15 DIAGNOSIS — J4489 Other specified chronic obstructive pulmonary disease: Secondary | ICD-10-CM

## 2018-01-15 DIAGNOSIS — Z833 Family history of diabetes mellitus: Secondary | ICD-10-CM | POA: Insufficient documentation

## 2018-01-15 DIAGNOSIS — I10 Essential (primary) hypertension: Secondary | ICD-10-CM

## 2018-01-15 DIAGNOSIS — J449 Chronic obstructive pulmonary disease, unspecified: Secondary | ICD-10-CM | POA: Insufficient documentation

## 2018-01-15 DIAGNOSIS — Z825 Family history of asthma and other chronic lower respiratory diseases: Secondary | ICD-10-CM | POA: Insufficient documentation

## 2018-01-15 DIAGNOSIS — Z7952 Long term (current) use of systemic steroids: Secondary | ICD-10-CM | POA: Insufficient documentation

## 2018-01-15 DIAGNOSIS — Z09 Encounter for follow-up examination after completed treatment for conditions other than malignant neoplasm: Secondary | ICD-10-CM | POA: Insufficient documentation

## 2018-01-15 DIAGNOSIS — Z947 Corneal transplant status: Secondary | ICD-10-CM | POA: Insufficient documentation

## 2018-01-15 DIAGNOSIS — Z9889 Other specified postprocedural states: Secondary | ICD-10-CM | POA: Insufficient documentation

## 2018-01-15 NOTE — Progress Notes (Signed)
Assessment & Plan:  Bryan Sullivan was seen today for follow-up.  Diagnoses and all orders for this visit:  Chronic obstructive airway disease with asthma (Bryan Sullivan) Continue inhalers as prescribed  Essential hypertension Continue all antihypertensives as prescribed.  Remember to bring in your blood pressure log with you for your follow up appointment.  DASH/Mediterranean Diets are healthier choices for HTN.     Patient has been counseled on age-appropriate routine health concerns for screening and prevention. These are reviewed and up-to-date. Referrals have been placed accordingly. Immunizations are up-to-date or declined.    Subjective:   Chief Complaint  Patient presents with  . Follow-up    Pt. is here to follow-up. Pt. have inhaler that would like PCP to help him use it.    HPI Bryan Sullivan 72 y.o. male presents to office today follow up for HTN and COPD.   CHRONIC HYPERTENSION Disease Monitoring  Blood pressure range: Well controlled here in the office today BP Readings from Last 3 Encounters:  01/15/18 117/75  01/06/18 133/70  01/01/18 123/77    Chest pain: no   Dyspnea: yes, intermittent due to COPD   Claudication: no  Medication compliance: yes, taking amlodipine 5 mg daily and losartan 100 mg daily Medication Side Effects  Lightheadedness: no   Urinary frequency: yes   Edema: no   Impotence: yes  Preventitive Healthcare:  Exercise: no   Diet Pattern: diet: general  Salt Restriction:  no   COPD Chronic.  He has never been a smoker. Diagnosed with asthma as a child. Symptoms include non productive cough, shortness of breath and wheezing. He denies any fevers. He was evaluated in the ED on 01/05/2018 with complaints of congestion.  He tried using his back-up albuterol nebulizer treatment however reported no relief of symptoms.  He was administered Solu-Medrol and mag sulfate as well as duo nebs in the ED. He was discharged home in stable condition with  tapering steroids and doxycycline which he has completed. Today he reports he has not started using his Anoro Ellipta as he was unsure how to self administer.  We watched the video here in the office from the official on Anoro website and patient verbalized understanding with postiive teach-back method. He does report improved shortness of breath and wheezing after taking the prednisone that was prescribed from the ED. Awaiting pulmonology consult for any further recommendations.    .Review of Systems  Constitutional: Negative for fever, malaise/fatigue and weight loss.  HENT: Negative.  Negative for nosebleeds.   Eyes: Negative.  Negative for blurred vision, double vision and photophobia.  Respiratory: Positive for shortness of breath (mild). Negative for cough.   Cardiovascular: Negative.  Negative for chest pain, palpitations and leg swelling.  Gastrointestinal: Negative.  Negative for heartburn, nausea and vomiting.  Musculoskeletal: Negative.  Negative for myalgias.  Neurological: Negative.  Negative for dizziness, focal weakness, seizures and headaches.  Psychiatric/Behavioral: Negative.  Negative for suicidal ideas.    Past Medical History:  Diagnosis Date  . Asthma    As child   . Enlarged prostate Dx 2001  . Hypertension    Dx at age 36    Past Surgical History:  Procedure Laterality Date  . CATARACT EXTRACTION W/ INTRAOCULAR LENS  IMPLANT, BILATERAL Bilateral   . CORNEAL TRANSPLANT Right   . EYE SURGERY      Family History  Problem Relation Age of Onset  . Diabetes Mother   . Asthma Father   . Asthma Sister  Social History Reviewed with no changes to be made today.   Outpatient Medications Prior to Visit  Medication Sig Dispense Refill  . albuterol (PROVENTIL HFA;VENTOLIN HFA) 108 (90 Base) MCG/ACT inhaler Inhale 1-2 puffs into the lungs every 6 (six) hours as needed for wheezing or shortness of breath. 1 Inhaler 0  . albuterol (PROVENTIL) (2.5 MG/3ML) 0.083%  nebulizer solution USE 3 MLS (2.5 MG TOTAL) BY NEBULIZATION EVERY 6 (SIX) HOURS AS NEEDED FOR WHEEZING OR SHORTNESS OF BREATH. 1080 mL 1  . amLODipine (NORVASC) 5 MG tablet Take 1 tablet (5 mg total) by mouth daily. 90 tablet 3  . brimonidine (ALPHAGAN) 0.2 % ophthalmic solution Place 1 drop into the right eye 3 (three) times daily. 15 mL 3  . Cholecalciferol (VITAMIN D-1000 MAX ST) 1000 units tablet Take 1,000 Units by mouth daily.    . dorzolamide (TRUSOPT) 2 % ophthalmic solution Place 1 drop into the right eye 3 (three) times daily. 30 mL 3  . doxycycline (VIBRAMYCIN) 100 MG capsule Take 1 capsule (100 mg total) by mouth 2 (two) times daily. One po bid x 7 days 14 capsule 0  . finasteride (PROSCAR) 5 MG tablet Take 1 tablet (5 mg total) by mouth daily. 90 tablet 1  . fluorometholone (FML) 0.1 % ophthalmic suspension Place 1 drop into both eyes daily. 15 mL 3  . Fluticasone-Salmeterol (ADVAIR) 100-50 MCG/DOSE AEPB Inhale 1 puff into the lungs 2 (two) times daily. 1 each 3  . latanoprost (XALATAN) 0.005 % ophthalmic solution Place 1 drop into both eyes at bedtime. 7.5 mL 3  . losartan (COZAAR) 100 MG tablet Take 1 tablet (100 mg total) by mouth daily. 90 tablet 3  . omeprazole (PRILOSEC) 20 MG capsule Take 1 capsule (20 mg total) by mouth daily. 90 capsule 1  . predniSONE (DELTASONE) 20 MG tablet 3 tabs po day one, then 2 po daily x 4 days 11 tablet 0  . tamsulosin (FLOMAX) 0.4 MG CAPS capsule Take 1 capsule (0.4 mg total) by mouth daily. 30 capsule 11  . timolol (TIMOPTIC) 0.5 % ophthalmic solution Place 1 drop into the right eye 2 (two) times daily. 15 mL 3  . umeclidinium-vilanterol (ANORO ELLIPTA) 62.5-25 MCG/INH AEPB Inhale 1 puff into the lungs daily. 60 each 3  . fluticasone (FLONASE) 50 MCG/ACT nasal spray Place 2 sprays into both nostrils daily. (Patient not taking: Reported on 09/25/2017) 16 g 2  . albuterol (PROAIR HFA) 108 (90 Base) MCG/ACT inhaler INHALE 2 PUFFS INTO THE LUNGS EVERY 2  HOURS AS NEEDED FOR WHEEZING, SHORTNESS OF BREATH, OR COUGH. (Patient not taking: Reported on 01/06/2018) 8.5 g 2  . azithromycin (ZITHROMAX) 250 MG tablet Take 1 tablet (250 mg total) by mouth daily. Take first 2 tablets together, then 1 every day until finished. (Patient not taking: Reported on 01/06/2018) 6 tablet 0   No facility-administered medications prior to visit.     No Known Allergies     Objective:    BP 117/75 (BP Location: Left Arm, Patient Position: Sitting, Cuff Size: Normal)   Pulse 90   Temp 98.5 F (36.9 C) (Oral)   Resp 14   Ht 5\' 10"  (1.778 m)   Wt 180 lb 12.8 oz (82 kg)   SpO2 97%   BMI 25.94 kg/m  Wt Readings from Last 3 Encounters:  01/15/18 180 lb 12.8 oz (82 kg)  01/01/18 180 lb 3.2 oz (81.7 kg)  09/25/17 181 lb (82.1 kg)    Physical Exam  Constitutional: He is oriented to person, place, and time. He appears well-developed and well-nourished. He is cooperative.  HENT:  Head: Normocephalic and atraumatic.  Eyes: EOM are normal.  Neck: Normal range of motion.  Cardiovascular: Normal rate, regular rhythm and normal heart sounds. Exam reveals no gallop and no friction rub.  No murmur heard. Pulmonary/Chest: Effort normal. No stridor. No respiratory distress. He has no decreased breath sounds. He has wheezes (mild expiratory ). He has no rhonchi. He has no rales. He exhibits no tenderness.  Abdominal: Soft. Bowel sounds are normal.  Musculoskeletal: Normal range of motion. He exhibits no edema.  Neurological: He is alert and oriented to person, place, and time. Coordination normal.  Skin: Skin is warm and dry.  Psychiatric: He has a normal mood and affect. His behavior is normal. Judgment and thought content normal.  Nursing note and vitals reviewed.      Patient has been counseled extensively about nutrition and exercise as well as the importance of adherence with medications and regular follow-up. The patient was given clear instructions to go to ER  or return to medical center if symptoms don't improve, worsen or new problems develop. The patient verbalized understanding.   Follow-up: Return in about 3 months (around 04/17/2018) for HTN.   Gildardo Pounds, FNP-BC [facility], [place]   01/23/2018, 7:31 PM

## 2018-01-23 ENCOUNTER — Encounter: Payer: Self-pay | Admitting: Nurse Practitioner

## 2018-02-02 ENCOUNTER — Encounter (HOSPITAL_COMMUNITY): Payer: Self-pay | Admitting: Emergency Medicine

## 2018-02-02 ENCOUNTER — Other Ambulatory Visit: Payer: Self-pay

## 2018-02-02 ENCOUNTER — Emergency Department (HOSPITAL_COMMUNITY)
Admission: EM | Admit: 2018-02-02 | Discharge: 2018-02-02 | Disposition: A | Discharge status: Home / Self Care | Payer: Self-pay | Attending: Emergency Medicine | Admitting: Emergency Medicine

## 2018-02-02 ENCOUNTER — Emergency Department (HOSPITAL_COMMUNITY): Payer: Self-pay

## 2018-02-02 DIAGNOSIS — I10 Essential (primary) hypertension: Secondary | ICD-10-CM | POA: Insufficient documentation

## 2018-02-02 DIAGNOSIS — Z79899 Other long term (current) drug therapy: Secondary | ICD-10-CM | POA: Insufficient documentation

## 2018-02-02 DIAGNOSIS — J441 Chronic obstructive pulmonary disease with (acute) exacerbation: Secondary | ICD-10-CM | POA: Insufficient documentation

## 2018-02-02 DIAGNOSIS — Z87891 Personal history of nicotine dependence: Secondary | ICD-10-CM | POA: Insufficient documentation

## 2018-02-02 LAB — BASIC METABOLIC PANEL
Anion gap: 7 (ref 5–15)
BUN: 12 mg/dL (ref 8–23)
CO2: 28 mmol/L (ref 22–32)
Calcium: 8.7 mg/dL — ABNORMAL LOW (ref 8.9–10.3)
Chloride: 105 mmol/L (ref 98–111)
Creatinine, Ser: 1.02 mg/dL (ref 0.61–1.24)
GFR calc Af Amer: >60 mL/min (ref >60)
GFR calc non Af Amer: >60 mL/min (ref >60)
Glucose, Bld: 128 mg/dL — ABNORMAL HIGH (ref 70–99)
Potassium: 3.8 mmol/L (ref 3.5–5.1)
Sodium: 140 mmol/L (ref 135–145)

## 2018-02-02 LAB — CBC
HCT: 37.9 % — ABNORMAL LOW (ref 39.0–52.0)
Hemoglobin: 11.8 g/dL — ABNORMAL LOW (ref 13.0–17.0)
MCH: 28 pg (ref 26.0–34.0)
MCHC: 31.1 g/dL (ref 30.0–36.0)
MCV: 89.8 fL (ref 78.0–100.0)
Platelets: 200 10*3/uL (ref 150–400)
RBC: 4.22 MIL/uL (ref 4.22–5.81)
RDW: 12.8 % (ref 11.5–15.5)
WBC: 4.2 10*3/uL (ref 4.0–10.5)

## 2018-02-02 LAB — BRAIN NATRIURETIC PEPTIDE: B Natriuretic Peptide: 52.1 pg/mL (ref 0.0–100.0)

## 2018-02-02 LAB — I-STAT TROPONIN, ED: Troponin i, poc: 0.02 ng/mL (ref 0.00–0.08)

## 2018-02-02 MED ORDER — ALBUTEROL (5 MG/ML) CONTINUOUS INHALATION SOLN
10.0000 mg/h | INHALATION_SOLUTION | Freq: Once | RESPIRATORY_TRACT | Status: AC
  Administered 2018-02-02: 10 mg/h via RESPIRATORY_TRACT
  Filled 2018-02-02: qty 20

## 2018-02-02 MED ORDER — PREDNISONE 10 MG (21) PO TBPK: ORAL_TABLET | ORAL | 0 refills | Status: DC

## 2018-02-02 MED ORDER — IPRATROPIUM BROMIDE 0.02 % IN SOLN
0.5000 mg | Freq: Once | RESPIRATORY_TRACT | Status: AC
  Administered 2018-02-02: 0.5 mg via RESPIRATORY_TRACT
  Filled 2018-02-02: qty 2.5

## 2018-02-02 MED ORDER — METHYLPREDNISOLONE SODIUM SUCC 125 MG IJ SOLR
125.0000 mg | Freq: Once | INTRAMUSCULAR | Status: AC
  Administered 2018-02-02: 125 mg via INTRAVENOUS
  Filled 2018-02-02: qty 2

## 2018-02-02 NOTE — ED Triage Notes (Signed)
C/o pain to center of chest, sob, and productive cough with yellow sputum x 4 weeks.  States he has been seen here for same and taking cough medication.

## 2018-02-02 NOTE — ED Provider Notes (Signed)
[facility] EMERGENCY DEPARTMENT Provider Note   CSN: [id number] Arrival date & time: 02/02/18  1759     History   Chief Complaint Chief Complaint  Patient presents with  . Chest Pain  . Shortness of Breath  . Cough    HPI Bryan Sullivan is a 72 y.o. male.  HPI Patient presents to the emergency room for evaluation of persistent cough congestion and shortness of breath.  Patient states he started having symptoms last month.  He felt like he was having wheezing and tightness in his chest.  He was seen in the emergency room and had a work-up.  Symptoms were felt to be related to his COPD.  Patient states since that time he has had intermittent improvement when using his breathing treatments but the symptoms will return any asked to continue using breathing treatments.  He has seen his primary care doctor and has also been on antibiotics.  Patient states he started having increasing difficulty with his breathing today.  He feels that he cannot take a deep breath because of the tightness.  He denies any chest pain.  He denies any leg swelling.  He denies any fevers or chills. Past Medical History:  Diagnosis Date  . Asthma    As child   . Enlarged prostate Dx 2001  . Hypertension    Dx at age 89    Patient Active Problem List   Diagnosis Date Noted  . Gastroesophageal reflux disease 10/08/2016  . Glaucoma (increased eye pressure) 12/16/2014  . Lumbar back pain 12/16/2014  . BPH (benign prostatic hyperplasia) 11/24/2014  . DJD (degenerative joint disease) of knee 11/24/2014  . Chronic obstructive airway disease with asthma ([facility]) 10/11/2014  . Essential hypertension 09/22/2014    Past Surgical History:  Procedure Laterality Date  . CATARACT EXTRACTION W/ INTRAOCULAR LENS  IMPLANT, BILATERAL Bilateral   . CORNEAL TRANSPLANT Right   . EYE SURGERY          Home Medications    Prior to Admission medications   Medication Sig Start Date End Date  Taking? Authorizing Provider  albuterol (PROVENTIL HFA;VENTOLIN HFA) 108 (90 Base) MCG/ACT inhaler Inhale 1-2 puffs into the lungs every 6 (six) hours as needed for wheezing or shortness of breath. 12/29/17   Long, Wonda Olds, MD  albuterol (PROVENTIL) (2.5 MG/3ML) 0.083% nebulizer solution USE 3 MLS (2.5 MG TOTAL) BY NEBULIZATION EVERY 6 (SIX) HOURS AS NEEDED FOR WHEEZING OR SHORTNESS OF BREATH. 12/17/16   Funches, Adriana Mccallum, MD  amLODipine (NORVASC) 5 MG tablet Take 1 tablet (5 mg total) by mouth daily. 09/25/17   Gildardo Pounds, NP  brimonidine (ALPHAGAN) 0.2 % ophthalmic solution Place 1 drop into the right eye 3 (three) times daily. 09/25/17 09/25/18  Gildardo Pounds, NP  Cholecalciferol (VITAMIN D-1000 MAX ST) 1000 units tablet Take 1,000 Units by mouth daily. 07/23/16   [provider]  dorzolamide (TRUSOPT) 2 % ophthalmic solution Place 1 drop into the right eye 3 (three) times daily. 09/25/17   Gildardo Pounds, NP  doxycycline (VIBRAMYCIN) 100 MG capsule Take 1 capsule (100 mg total) by mouth 2 (two) times daily. One po bid x 7 days 01/06/18   Palumbo, April, MD  finasteride (PROSCAR) 5 MG tablet Take 1 tablet (5 mg total) by mouth daily. 09/25/17   Gildardo Pounds, NP  fluorometholone (FML) 0.1 % ophthalmic suspension Place 1 drop into both eyes daily. 09/25/17   Gildardo Pounds, NP  fluticasone (FLONASE) 50  MCG/ACT nasal spray Place 2 sprays into both nostrils daily. Patient not taking: Reported on 09/25/2017 07/26/16   Argentina Donovan, PA-C  Fluticasone-Salmeterol (ADVAIR) 100-50 MCG/DOSE AEPB Inhale 1 puff into the lungs 2 (two) times daily. 01/01/18   Gildardo Pounds, NP  latanoprost (XALATAN) 0.005 % ophthalmic solution Place 1 drop into both eyes at bedtime. 09/25/17   Gildardo Pounds, NP  losartan (COZAAR) 100 MG tablet Take 1 tablet (100 mg total) by mouth daily. 09/25/17   Gildardo Pounds, NP  omeprazole (PRILOSEC) 20 MG capsule Take 1 capsule (20 mg total) by mouth daily. 01/01/18 04/01/18   Gildardo Pounds, NP  predniSONE (STERAPRED UNI-PAK 21 TAB) 10 MG (21) TBPK tablet Take 6 tablets (60 mg total) by mouth daily for 2 days, THEN 5 tablets (50 mg total) daily for 2 days, THEN 4 tablets (40 mg total) daily for 2 days, THEN 3 tablets (30 mg total) daily for 2 days, THEN 2 tablets (20 mg total) daily for 2 days, THEN 1 tablet (10 mg total) daily for 2 days. 02/02/18 02/14/18  Dorie Rank, MD  tamsulosin (FLOMAX) 0.4 MG CAPS capsule Take 1 capsule (0.4 mg total) by mouth daily. 09/25/17   Gildardo Pounds, NP  timolol (TIMOPTIC) 0.5 % ophthalmic solution Place 1 drop into the right eye 2 (two) times daily. 09/25/17   Gildardo Pounds, NP  umeclidinium-vilanterol (ANORO ELLIPTA) 62.5-25 MCG/INH AEPB Inhale 1 puff into the lungs daily. 01/01/18   Gildardo Pounds, NP    Family History Family History  Problem Relation Age of Onset  . Diabetes Mother   . Asthma Father   . Asthma Sister     Social History Social History   Tobacco Use  . Smoking status: Former Smoker    Types: Cigarettes    Last attempt to quit: 09/21/1968    Years since quitting: 49.4  . Smokeless tobacco: Never Used  . Tobacco comment: "quit smoking cigarettes in 1970"  Substance Use Topics  . Alcohol use: No  . Drug use: No     Allergies   Patient has no known allergies.   Review of Systems Review of Systems  All other systems reviewed and are negative.    Physical Exam Updated Vital Signs BP 129/70   Pulse 69   Resp 15   SpO2 98%   Physical Exam  Constitutional:  Non-toxic appearance. He does not appear ill. No distress.  HENT:  Head: Normocephalic and atraumatic.  Right Ear: External ear normal.  Left Ear: External ear normal.  Eyes: Conjunctivae are normal. Right eye exhibits no discharge. Left eye exhibits no discharge. No scleral icterus.  Neck: Neck supple. No tracheal deviation present.  Cardiovascular: Normal rate, regular rhythm and intact distal pulses.  Pulmonary/Chest: Effort normal.  No accessory muscle usage or stridor. No tachypnea. No respiratory distress. He has decreased breath sounds. He has no wheezes. He has no rales.  Abdominal: Soft. Bowel sounds are normal. He exhibits no distension. There is no tenderness. There is no rebound and no guarding.  Musculoskeletal: He exhibits no edema or tenderness.  Neurological: He is alert. He has normal strength. No cranial nerve deficit (no facial droop, extraocular movements intact, no slurred speech) or sensory deficit. He exhibits normal muscle tone. He displays no seizure activity. Coordination normal.  Skin: Skin is warm and dry. No rash noted.  Psychiatric: He has a normal mood and affect.  Nursing note and vitals reviewed.  ED Treatments / Results  Labs (all labs ordered are listed, but only abnormal results are displayed) Labs Reviewed  BASIC METABOLIC PANEL - Abnormal; Notable for the following components:      Result Value   Glucose, Bld 128 (*)    Calcium 8.7 (*)    All other components within normal limits  CBC - Abnormal; Notable for the following components:   Hemoglobin 11.8 (*)    HCT 37.9 (*)    All other components within normal limits  BRAIN NATRIURETIC PEPTIDE  I-STAT TROPONIN, ED    EKG EKG Interpretation  Date/Time:  Sunday February 02 2018 18:02:13 EDT Ventricular Rate:  85 PR Interval:  208 QRS Duration: 88 QT Interval:  364 QTC Calculation: 433 R Axis:   -43 Text Interpretation:  Sinus rhythm with Premature supraventricular complexes Left axis deviation Abnormal ECG No significant change since last tracing Confirmed by Malayasia Mirkin ([id number]) on 02/02/2018 6:24:06 PM   Radiology Dg Chest 2 View  Result Date: 02/02/2018 CLINICAL DATA:  Central chest pain, shortness of breath and productive cough for the past 4 weeks. EXAM: CHEST - 2 VIEW COMPARISON:  01/06/2018. FINDINGS: Normal sized heart. Clear lungs. The lungs are hyperexpanded with mild diffuse prominence of the interstitial  markings. Thoracic spine degenerative changes. IMPRESSION: No acute abnormality.  Mild changes of COPD. Electronically Signed   By: Steven  Reid M.D.   On: 02/02/2018 18:51    Procedures Procedures (including critical care time)  Medications Ordered in ED Medications  methylPREDNISolone sodium succinate (SOLU-MEDROL) 125 mg/2 mL injection 125 mg (125 mg Intravenous Given 02/02/18 2037)  albuterol (PROVENTIL,VENTOLIN) solution continuous neb (10 mg/hr Nebulization Given 02/02/18 1848)  ipratropium (ATROVENT) nebulizer solution 0.5 mg (0.5 mg Nebulization Given 02/02/18 1849)     Initial Impression / Assessment and Plan / ED Course  I have reviewed the triage vital signs and the nursing notes.  Pertinent labs & imaging results that were available during my care of the patient were reviewed by me and considered in my medical decision making (see chart for details).  Clinical Course as of Feb 02 2099  Sun Feb 02, 2018  1929 Chest x-ray without pneumonia.   [JK]  2018 Patient states he is feeling much better after treatment.  Feels like he is able to breathe easily   [JK]    Clinical Course User Index [JK] Maria Coin, MD    Patient presented to the emergency room for evaluation of shortness of breath.  Patient symptoms are consistent with an asthma/COPD exacerbation.  He was treated with breathing treatments and steroids.  Symptoms have improved significantly.  His laboratory tests and x-rays do not show evidence of pneumonia or congestive heart failure.  I have a low suspicion for pulmonary embolism.  Plan on discharge home with a course of steroids.  No indication for antibiotics at this time.  Recommend follow-up with his primary care doctor to discuss further treatment.  Final Clinical Impressions(s) / ED Diagnoses   Final diagnoses:  COPD exacerbation ([facility])    ED Discharge Orders         Ordered    predniSONE (STERAPRED UNI-PAK 21 TAB) 10 MG (21) TBPK tablet     09 /08/19 2100            Dorie Rank, MD 02/02/18 2100

## 2018-02-02 NOTE — Discharge Instructions (Addendum)
The medications as prescribed, follow-up with your primary care doctor this week to be rechecked

## 2018-02-02 NOTE — ED Notes (Signed)
Pt complains of SOB that started early today. Pt reports being given a breathing treatment which has helped tremendously. Pt denies any pain or SOB at this time.

## 2018-02-02 NOTE — ED Notes (Signed)
Patient verbalizes understanding of discharge instructions. Opportunity for questioning and answers were provided. Armband removed by staff, pt discharged from ED to home via POV  

## 2018-02-03 MED FILL — predniSONE 10 MG TABS: 10 | 12 days supply | Qty: 42 | Fill #0

## 2018-02-03 MED FILL — FINASTERIDE 5 MG TABLET: 5 | 30 days supply | Qty: 30 | Fill #1

## 2018-02-03 MED FILL — AMLODIPINE BESYLATE 5 MG TA: 5 | 30 days supply | Qty: 30 | Fill #6

## 2018-02-03 MED FILL — TAMSULOSIN HCL 0.4 MG CAP: 0.4 | 30 days supply | Qty: 30 | Fill #4

## 2018-02-03 MED FILL — LOSARTAN POTASSIUM 100 MG T: 100 | 30 days supply | Qty: 30 | Fill #2

## 2018-02-05 ENCOUNTER — Encounter: Payer: Self-pay | Admitting: Critical Care Medicine

## 2018-02-05 ENCOUNTER — Ambulatory Visit: Payer: Self-pay | Attending: Critical Care Medicine | Admitting: Critical Care Medicine

## 2018-02-05 VITALS — BP 171/73 | HR 91 | Temp 97.9°F | Resp 16 | Wt 183.6 lb

## 2018-02-05 DIAGNOSIS — N400 Enlarged prostate without lower urinary tract symptoms: Secondary | ICD-10-CM | POA: Insufficient documentation

## 2018-02-05 DIAGNOSIS — Z79899 Other long term (current) drug therapy: Secondary | ICD-10-CM | POA: Insufficient documentation

## 2018-02-05 DIAGNOSIS — I10 Essential (primary) hypertension: Secondary | ICD-10-CM | POA: Insufficient documentation

## 2018-02-05 DIAGNOSIS — J441 Chronic obstructive pulmonary disease with (acute) exacerbation: Secondary | ICD-10-CM | POA: Insufficient documentation

## 2018-02-05 DIAGNOSIS — Z7951 Long term (current) use of inhaled steroids: Secondary | ICD-10-CM | POA: Insufficient documentation

## 2018-02-05 DIAGNOSIS — Z87891 Personal history of nicotine dependence: Secondary | ICD-10-CM | POA: Insufficient documentation

## 2018-02-05 DIAGNOSIS — J449 Chronic obstructive pulmonary disease, unspecified: Secondary | ICD-10-CM

## 2018-02-05 MED ORDER — FLUTICASONE-SALMETEROL 250-50 MCG/DOSE IN AEPB: 1.0000 | INHALATION_SPRAY | Freq: Two times a day (BID) | RESPIRATORY_TRACT | 3 refills | Status: DC

## 2018-02-05 MED FILL — TIMOLOL 0.5% EYE DROPS: 0.5 | 75 days supply | Qty: 10 | Fill #0

## 2018-02-05 MED FILL — DORZOLAMIDE HCL 2% EYE DRP: 2 | 50 days supply | Qty: 10 | Fill #0

## 2018-02-05 MED FILL — !ADVAIR 250/50 DISKUS: 250-50 | 90 days supply | Qty: 180 | Fill #0

## 2018-02-05 MED FILL — FLUOROMETHOLONE 0.1% DROPS: 0.1 | 37 days supply | Qty: 5 | Fill #0

## 2018-02-05 MED FILL — LATANOPROST 0.005% EYE DRP: 0.005 | 25 days supply | Qty: 3 | Fill #0

## 2018-02-05 MED FILL — OMEPRAZOLE 20 MG CAP: 20 | 30 days supply | Qty: 30 | Fill #1

## 2018-02-05 NOTE — Assessment & Plan Note (Addendum)
Chronic obstructive lung disease with acute exacerbation no evidence of pneumonia at this time probable reactive airway component Plan increase Advair to 250 strength one puff twice daily  discontinue anoro due to anticholinergic side effect of bladder retention secondary to prostatic hypertrophy note to my optic eyedrops may be playing a role with bronco constriction we made need to consider discontinuing this and discuss with the patient's ophthalmology physician

## 2018-02-05 NOTE — Progress Notes (Signed)
Subjective:    Patient ID: Bryan Sullivan, male    DOB: 1946-03-15, 72 y.o.   MRN: [id number]  72 y.o.M  4 weeks of dyspnea and cough.  Notes chest tightness, if walks 5-6 min gets very dyspneic.  Now as bad as ever    Pt is from [place] and had a pulm MD there , not seen in 34yrs.  Non smoker since 108.  Smoked about 7-8cig x 35yrs.      Now on inhalers.  Shortness of Breath  This is a chronic problem. The current episode started more than 1 year ago. The problem occurs daily (occ sob at rest , or short distance). The problem has been gradually worsening. Associated symptoms include ear pain, sputum production and wheezing. Pertinent negatives include no chest pain, fever, headaches, hemoptysis, leg pain, leg swelling, orthopnea, PND, rhinorrhea or sore throat. The symptoms are aggravated by weather changes, pollens, any activity, exercise and eating. Associated symptoms comments: Mucus is yellow in nature. Risk factors include smoking. He has tried beta agonist inhalers for the symptoms. His past medical history is significant for allergies, asthma, COPD and pneumonia. There is no history of CAD, a heart failure or PE.     Past Medical History:  Diagnosis Date  . Asthma    As child   . Enlarged prostate Dx 2001  . Hypertension    Dx at age 21     Family History  Problem Relation Age of Onset  . Diabetes Mother   . Asthma Father   . Asthma Sister      Social History   Socioeconomic History  . Marital status: Married    Spouse name: Not on file  . Number of children: Not on file  . Years of education: Not on file  . Highest education level: Not on file  Occupational History  . Not on file  Social Needs  . Financial resource strain: Not on file  . Food insecurity:    Worry: Not on file    Inability: Not on file  . Transportation needs:    Medical: Not on file    Non-medical: Not on file  Tobacco Use  . Smoking status: Former Smoker    Types: Cigarettes    Last  attempt to quit: 09/21/1968    Years since quitting: 49.4  . Smokeless tobacco: Never Used  . Tobacco comment: "quit smoking cigarettes in 1970"  Substance and Sexual Activity  . Alcohol use: No  . Drug use: No  . Sexual activity: Not on file  Lifestyle  . Physical activity:    Days per week: Not on file    Minutes per session: Not on file  . Stress: Not on file  Relationships  . Social connections:    Talks on phone: Not on file    Gets together: Not on file    Attends religious service: Not on file    Active member of club or organization: Not on file    Attends meetings of clubs or organizations: Not on file    Relationship status: Not on file  . Intimate partner violence:    Fear of current or ex partner: Not on file    Emotionally abused: Not on file    Physically abused: Not on file    Forced sexual activity: Not on file  Other Topics Concern  . Not on file  Social History Narrative   From [place]   Moved to [place] in 2014  No Known Allergies   Outpatient Medications Prior to Visit  Medication Sig Dispense Refill  . albuterol (PROVENTIL HFA;VENTOLIN HFA) 108 (90 Base) MCG/ACT inhaler Inhale 1-2 puffs into the lungs every 6 (six) hours as needed for wheezing or shortness of breath. 1 Inhaler 0  . albuterol (PROVENTIL) (2.5 MG/3ML) 0.083% nebulizer solution USE 3 MLS (2.5 MG TOTAL) BY NEBULIZATION EVERY 6 (SIX) HOURS AS NEEDED FOR WHEEZING OR SHORTNESS OF BREATH. 1080 mL 1  . amLODipine (NORVASC) 5 MG tablet Take 1 tablet (5 mg total) by mouth daily. 90 tablet 3  . Cholecalciferol (VITAMIN D-1000 MAX ST) 1000 units tablet Take 1,000 Units by mouth daily.    . dorzolamide (TRUSOPT) 2 % ophthalmic solution Place 1 drop into the right eye 3 (three) times daily. 30 mL 3  . finasteride (PROSCAR) 5 MG tablet Take 1 tablet (5 mg total) by mouth daily. 90 tablet 1  . fluorometholone (FML) 0.1 % ophthalmic suspension Place 1 drop into both eyes daily. 15 mL 3  . fluticasone  (FLONASE) 50 MCG/ACT nasal spray Place 2 sprays into both nostrils daily. 16 g 2  . latanoprost (XALATAN) 0.005 % ophthalmic solution Place 1 drop into both eyes at bedtime. 7.5 mL 3  . losartan (COZAAR) 100 MG tablet Take 1 tablet (100 mg total) by mouth daily. 90 tablet 3  . omeprazole (PRILOSEC) 20 MG capsule Take 1 capsule (20 mg total) by mouth daily. 90 capsule 1  . tamsulosin (FLOMAX) 0.4 MG CAPS capsule Take 1 capsule (0.4 mg total) by mouth daily. 30 capsule 11  . timolol (TIMOPTIC) 0.5 % ophthalmic solution Place 1 drop into the right eye 2 (two) times daily. 15 mL 3  . Fluticasone-Salmeterol (ADVAIR) 100-50 MCG/DOSE AEPB Inhale 1 puff into the lungs 2 (two) times daily. 1 each 3  . predniSONE (STERAPRED UNI-PAK 21 TAB) 10 MG (21) TBPK tablet Take 6 tablets (60 mg total) by mouth daily for 2 days, THEN 5 tablets (50 mg total) daily for 2 days, THEN 4 tablets (40 mg total) daily for 2 days, THEN 3 tablets (30 mg total) daily for 2 days, THEN 2 tablets (20 mg total) daily for 2 days, THEN 1 tablet (10 mg total) daily for 2 days. 42 tablet 0  . umeclidinium-vilanterol (ANORO ELLIPTA) 62.5-25 MCG/INH AEPB Inhale 1 puff into the lungs daily. 60 each 3  . brimonidine (ALPHAGAN) 0.2 % ophthalmic solution Place 1 drop into the right eye 3 (three) times daily. (Patient not taking: Reported on 02/05/2018) 15 mL 3  . doxycycline (VIBRAMYCIN) 100 MG capsule Take 1 capsule (100 mg total) by mouth 2 (two) times daily. One po bid x 7 days (Patient not taking: Reported on 02/05/2018) 14 capsule 0   No facility-administered medications prior to visit.     Review of Systems  Constitutional: Positive for fatigue. Negative for fever.  HENT: Positive for ear pain. Negative for rhinorrhea, sinus pressure, sinus pain and sore throat.   Respiratory: Positive for cough, sputum production, chest tightness, shortness of breath and wheezing. Negative for hemoptysis.   Cardiovascular: Negative for chest pain,  palpitations, orthopnea, leg swelling and PND.  Gastrointestinal: Negative.   Neurological: Negative for headaches.       Objective:   Physical Exam Vitals:   02/05/18 1059  BP: (!) 171/73  Pulse: 91  Resp: 16  Temp: 97.9 F (36.6 C)  TempSrc: Oral  SpO2: 100%  Weight: 183 lb 9.6 oz (83.3 kg)    Gen:  Pleasant, well-nourished, in no distress,  normal affect  ENT: No lesions,  mouth clear,  oropharynx clear, no postnasal drip  Neck: No JVD, no TMG, no carotid bruits  Lungs: No use of accessory muscles, no dullness to percussion, clear without rales or rhonchi  Cardiovascular: RRR, heart sounds normal, no murmur or gallops, no peripheral edema  Abdomen: soft and NT, no HSM,  BS normal  Musculoskeletal: No deformities, no cyanosis or clubbing  Neuro: alert, non focal  Skin: Warm, no lesions or rashes  No results found.   CXR 9/9 IMPRESSION: No acute abnormality.  Mild changes of COPD.     Assessment & Plan:  I personally reviewed all images and lab data in the [facility] system as well as any outside material available during this office visit and agree with the  radiology impressions.   COPD exacerbation (HCC) Chronic obstructive lung disease with acute exacerbation no evidence of pneumonia at this time probable reactive airway component Plan increase Advair to 250 strength one puff twice daily  discontinue anoro due to anticholinergic side effect of bladder retention secondary to prostatic hypertrophy note to my optic eyedrops may be playing a role with bronco constriction we made need to consider discontinuing this and discuss with the patient's ophthalmology physician    Chronic obstructive airway disease with asthma ([place]) Review COPD exacerbation assessment   Bryan Sullivan was seen today for copd.  Diagnoses and all orders for this visit:  Chronic obstructive airway disease with asthma ([place]) -     Pulmonary Function Test; Future  COPD exacerbation  ([place])  Other orders -     Fluticasone-Salmeterol (ADVAIR) 250-50 MCG/DOSE AEPB; Inhale 1 puff into the lungs 2 (two) times daily.

## 2018-02-05 NOTE — Assessment & Plan Note (Signed)
Review COPD exacerbation assessment

## 2018-02-05 NOTE — Patient Instructions (Signed)
Lung function test will be obtained Increase advair 250 one puff twice daily Stop ANORO Use albuterol as needed  2 puff every 4-6 hours Return 2 months

## 2018-02-16 ENCOUNTER — Encounter (HOSPITAL_COMMUNITY): Payer: Self-pay | Admitting: Emergency Medicine

## 2018-02-16 ENCOUNTER — Emergency Department (HOSPITAL_COMMUNITY)
Admission: EM | Admit: 2018-02-16 | Discharge: 2018-02-16 | Discharge status: Against Medical Advice | Payer: Self-pay | Attending: Emergency Medicine | Admitting: Emergency Medicine

## 2018-02-16 DIAGNOSIS — Z5321 Procedure and treatment not carried out due to patient leaving prior to being seen by health care provider: Secondary | ICD-10-CM | POA: Insufficient documentation

## 2018-02-16 NOTE — ED Triage Notes (Signed)
Pt c/o R eye pain, itching and redness. Pt c/o eye discharge since yesterday. Pt states he is having difficulty seeing out of R eye.

## 2018-02-18 MED FILL — BESIVANCE 0.6% SUSP: 0.6 | 54 days supply | Qty: 5 | Fill #0

## 2018-03-04 MED FILL — TAMSULOSIN HCL 0.4 MG CAP: 0.4 | 30 days supply | Qty: 30 | Fill #5

## 2018-03-04 MED FILL — AMLODIPINE BESYLATE 5 MG TA: 5 | 30 days supply | Qty: 30 | Fill #7

## 2018-03-04 MED FILL — FINASTERIDE 5 MG TABLET: 5 | 30 days supply | Qty: 30 | Fill #2

## 2018-03-04 MED FILL — LOSARTAN POTASSIUM 100 MG T: 100 | 30 days supply | Qty: 30 | Fill #3

## 2018-03-12 MED FILL — LATANOPROST 0.005% EYE DRP: 0.005 | 25 days supply | Qty: 3 | Fill #1

## 2018-03-12 MED FILL — FLUOROMETHOLONE 0.1% DROPS: 0.1 | 37 days supply | Qty: 5 | Fill #1

## 2018-03-12 MED FILL — OMEPRAZOLE 20 MG CAP: 20 | 30 days supply | Qty: 30 | Fill #2

## 2018-03-26 ENCOUNTER — Other Ambulatory Visit: Payer: Self-pay | Admitting: Nurse Practitioner

## 2018-03-26 MED ORDER — ALBUTEROL SULFATE HFA 108 (90 BASE) MCG/ACT IN AERS: 1.0000 | INHALATION_SPRAY | Freq: Four times a day (QID) | RESPIRATORY_TRACT | 3 refills | Status: DC | PRN

## 2018-03-26 MED FILL — ALBUTEROL SULFATE HFA 108 (: 108 (90 BAS | 25 days supply | Qty: 18 | Fill #0

## 2018-04-01 MED FILL — LOSARTAN POTASSIUM 100 MG T: 100 | 30 days supply | Qty: 30 | Fill #4

## 2018-04-01 MED FILL — TAMSULOSIN HCL 0.4 MG CAP: 0.4 | 30 days supply | Qty: 30 | Fill #6

## 2018-04-01 MED FILL — FINASTERIDE 5 MG TABLET: 5 | 30 days supply | Qty: 30 | Fill #3

## 2018-04-01 MED FILL — AMLODIPINE BESYLATE 5 MG TA: 5 | 30 days supply | Qty: 30 | Fill #8

## 2018-04-07 ENCOUNTER — Ambulatory Visit: Payer: Self-pay | Admitting: Nurse Practitioner

## 2018-04-07 ENCOUNTER — Encounter: Payer: Self-pay | Admitting: Critical Care Medicine

## 2018-04-07 ENCOUNTER — Other Ambulatory Visit: Payer: Self-pay

## 2018-04-07 ENCOUNTER — Ambulatory Visit: Payer: Self-pay | Attending: Critical Care Medicine | Admitting: Critical Care Medicine

## 2018-04-07 VITALS — BP 122/77 | HR 70 | Temp 98.6°F | Resp 16 | Wt 187.5 lb

## 2018-04-07 DIAGNOSIS — Z23 Encounter for immunization: Secondary | ICD-10-CM | POA: Insufficient documentation

## 2018-04-07 DIAGNOSIS — I10 Essential (primary) hypertension: Secondary | ICD-10-CM | POA: Insufficient documentation

## 2018-04-07 DIAGNOSIS — Z87891 Personal history of nicotine dependence: Secondary | ICD-10-CM | POA: Insufficient documentation

## 2018-04-07 DIAGNOSIS — Z79899 Other long term (current) drug therapy: Secondary | ICD-10-CM | POA: Insufficient documentation

## 2018-04-07 DIAGNOSIS — J449 Chronic obstructive pulmonary disease, unspecified: Secondary | ICD-10-CM | POA: Insufficient documentation

## 2018-04-07 MED FILL — !ADVAIR 250/50 DISKUS: 250-50 | 30 days supply | Qty: 60 | Fill #1

## 2018-04-07 NOTE — Progress Notes (Signed)
Follow up today:  Continued SOB  Concerns about mail order Rx; Wrong dose being sent to his house.

## 2018-04-07 NOTE — Assessment & Plan Note (Signed)
Chronic obstructive lung disease with asthmatic component The patient needs to be on Advair 250 but only receive the 100 strength from the drug supply program Plan Obtain samples of Advair 250 at 1 puff twice daily Ensure that the drug supply program is delivering the Advair at the 250 strength Use albuterol as needed Patient was reinstructed as to proper use of an HFA device Return 2 months Administer flu vaccine

## 2018-04-07 NOTE — Patient Instructions (Signed)
A flu vaccine will be given Stay on Advair 250 one puff twice daily Stay on albuterol as needed Return 3 months

## 2018-04-07 NOTE — Progress Notes (Signed)
Subjective:    Patient ID: Bryan Sullivan, male    DOB: Sep 28, 1945, 72 y.o.   MRN: [id number]  72 y.o.M  4 weeks of dyspnea and cough.  Notes chest tightness, if walks 5-6 min gets very dyspneic.  Now as bad as ever    Pt is from [place] and had a pulm MD there , not seen in 70yrs.  Non smoker since 67.  Smoked about 7-8cig x 80yrs.      Now on inhalers.  Shortness of Breath  This is a chronic problem. The current episode started more than 1 year ago. The problem occurs daily (occ sob at rest , or short distance). The problem has been rapidly improving. Associated symptoms include ear pain, sputum production and wheezing. Pertinent negatives include no chest pain, fever, headaches, hemoptysis, leg pain, leg swelling, orthopnea, PND, rhinorrhea or sore throat. The symptoms are aggravated by weather changes, pollens, any activity, exercise and eating. Associated symptoms comments: Mucus is yellow in nature. Risk factors include smoking. He has tried beta agonist inhalers for the symptoms. His past medical history is significant for allergies, asthma, COPD and pneumonia. There is no history of CAD, a heart failure or PE.     Past Medical History:  Diagnosis Date  . Asthma    As child   . Enlarged prostate Dx 2001  . Hypertension    Dx at age 59     Family History  Problem Relation Age of Onset  . Diabetes Mother   . Asthma Father   . Asthma Sister      Social History   Socioeconomic History  . Marital status: Married    Spouse name: Not on file  . Number of children: Not on file  . Years of education: Not on file  . Highest education level: Not on file  Occupational History  . Not on file  Social Needs  . Financial resource strain: Not on file  . Food insecurity:    Worry: Not on file    Inability: Not on file  . Transportation needs:    Medical: Not on file    Non-medical: Not on file  Tobacco Use  . Smoking status: Former Smoker    Types: Cigarettes    Last  attempt to quit: 09/21/1968    Years since quitting: 49.5  . Smokeless tobacco: Never Used  . Tobacco comment: "quit smoking cigarettes in 1970"  Substance and Sexual Activity  . Alcohol use: No  . Drug use: No  . Sexual activity: Not on file  Lifestyle  . Physical activity:    Days per week: Not on file    Minutes per session: Not on file  . Stress: Not on file  Relationships  . Social connections:    Talks on phone: Not on file    Gets together: Not on file    Attends religious service: Not on file    Active member of club or organization: Not on file    Attends meetings of clubs or organizations: Not on file    Relationship status: Not on file  . Intimate partner violence:    Fear of current or ex partner: Not on file    Emotionally abused: Not on file    Physically abused: Not on file    Forced sexual activity: Not on file  Other Topics Concern  . Not on file  Social History Narrative   From [place]   Moved to [place] in 2014  No Known Allergies   Outpatient Medications Prior to Visit  Medication Sig Dispense Refill  . albuterol (PROVENTIL HFA;VENTOLIN HFA) 108 (90 Base) MCG/ACT inhaler Inhale 1-2 puffs into the lungs every 6 (six) hours as needed for wheezing or shortness of breath. 1 Inhaler 3  . albuterol (PROVENTIL) (2.5 MG/3ML) 0.083% nebulizer solution USE 3 MLS (2.5 MG TOTAL) BY NEBULIZATION EVERY 6 (SIX) HOURS AS NEEDED FOR WHEEZING OR SHORTNESS OF BREATH. 1080 mL 1  . amLODipine (NORVASC) 5 MG tablet Take 1 tablet (5 mg total) by mouth daily. 90 tablet 3  . BESIVANCE 0.6 % SUSP Place 1 drop into the right eye 4 (four) times daily.  0  . dorzolamide (TRUSOPT) 2 % ophthalmic solution Place 1 drop into the right eye 3 (three) times daily. 30 mL 3  . finasteride (PROSCAR) 5 MG tablet Take 1 tablet (5 mg total) by mouth daily. 90 tablet 1  . fluorometholone (FML) 0.1 % ophthalmic suspension Place 1 drop into both eyes daily. 15 mL 3  . Fluticasone-Salmeterol (ADVAIR)  250-50 MCG/DOSE AEPB Inhale 1 puff into the lungs 2 (two) times daily. 1 each 3  . latanoprost (XALATAN) 0.005 % ophthalmic solution Place 1 drop into both eyes at bedtime. 7.5 mL 3  . losartan (COZAAR) 100 MG tablet Take 1 tablet (100 mg total) by mouth daily. 90 tablet 3  . omeprazole (PRILOSEC) 20 MG capsule Take 1 capsule (20 mg total) by mouth daily. 90 capsule 1  . tamsulosin (FLOMAX) 0.4 MG CAPS capsule Take 1 capsule (0.4 mg total) by mouth daily. 30 capsule 11  . timolol (TIMOPTIC) 0.5 % ophthalmic solution Place 1 drop into the right eye 2 (two) times daily. 15 mL 3  . Cholecalciferol (VITAMIN D-1000 MAX ST) 1000 units tablet Take 1,000 Units by mouth daily.    . fluticasone (FLONASE) 50 MCG/ACT nasal spray Place 2 sprays into both nostrils daily. (Patient not taking: Reported on 04/07/2018) 16 g 2   No facility-administered medications prior to visit.     Review of Systems  Constitutional: Positive for fatigue. Negative for fever.  HENT: Positive for ear pain. Negative for rhinorrhea, sinus pressure, sinus pain and sore throat.   Respiratory: Positive for cough, sputum production, chest tightness, shortness of breath and wheezing. Negative for hemoptysis.   Cardiovascular: Negative for chest pain, palpitations, orthopnea, leg swelling and PND.  Gastrointestinal: Negative.   Neurological: Negative for headaches.       Objective:   Physical Exam  Vitals:   04/07/18 1018  BP: 122/77  Pulse: 70  Resp: 16  Temp: 98.6 F (37 C)  TempSrc: Oral  SpO2: 100%  Weight: 187 lb 8 oz (85 kg)    Gen: Pleasant, well-nourished, in no distress,  normal affect  ENT: No lesions,  mouth clear,  oropharynx clear, no postnasal drip  Neck: No JVD, no TMG, no carotid bruits  Lungs: No use of accessory muscles, no dullness to percussion, clear without rales or rhonchi  Cardiovascular: RRR, heart sounds normal, no murmur or gallops, no peripheral edema  Abdomen: soft and NT, no HSM,  BS  normal  Musculoskeletal: No deformities, no cyanosis or clubbing  Neuro: alert, non focal  Skin: Warm, no lesions or rashes  No results found.   CXR 9/9 IMPRESSION: No acute abnormality.  Mild changes of COPD.     Assessment & Plan:  I personally reviewed all images and lab data in the [facility] system as well as any outside material  available during this office visit and agree with the  radiology impressions.   Chronic obstructive airway disease with asthma (HCC) Chronic obstructive lung disease with asthmatic component The patient needs to be on Advair 250 but only receive the 100 strength from the drug supply program Plan Obtain samples of Advair 250 at 1 puff twice daily Ensure that the drug supply program is delivering the Advair at the 250 strength Use albuterol as needed Patient was reinstructed as to proper use of an HFA device Return 2 months Administer flu vaccine   Reynard was seen today for follow-up.  Diagnoses and all orders for this visit:  Chronic obstructive airway disease with asthma ([place])  Need for immunization against influenza -     Flu Vaccine QUAD 36+ mos IM

## 2018-04-18 ENCOUNTER — Ambulatory Visit: Payer: Self-pay | Attending: Nurse Practitioner | Admitting: Nurse Practitioner

## 2018-04-18 ENCOUNTER — Encounter: Payer: Self-pay | Admitting: Nurse Practitioner

## 2018-04-18 VITALS — BP 137/79 | HR 75 | Temp 98.1°F | Ht 70.0 in | Wt 185.0 lb

## 2018-04-18 DIAGNOSIS — R7303 Prediabetes: Secondary | ICD-10-CM | POA: Insufficient documentation

## 2018-04-18 DIAGNOSIS — J449 Chronic obstructive pulmonary disease, unspecified: Secondary | ICD-10-CM | POA: Insufficient documentation

## 2018-04-18 DIAGNOSIS — Z7951 Long term (current) use of inhaled steroids: Secondary | ICD-10-CM | POA: Insufficient documentation

## 2018-04-18 DIAGNOSIS — H401111 Primary open-angle glaucoma, right eye, mild stage: Secondary | ICD-10-CM

## 2018-04-18 DIAGNOSIS — I10 Essential (primary) hypertension: Secondary | ICD-10-CM | POA: Insufficient documentation

## 2018-04-18 DIAGNOSIS — Z79899 Other long term (current) drug therapy: Secondary | ICD-10-CM | POA: Insufficient documentation

## 2018-04-18 DIAGNOSIS — Z124 Encounter for screening for malignant neoplasm of cervix: Secondary | ICD-10-CM

## 2018-04-18 DIAGNOSIS — H409 Unspecified glaucoma: Secondary | ICD-10-CM | POA: Insufficient documentation

## 2018-04-18 LAB — POCT GLYCOSYLATED HEMOGLOBIN (HGB A1C): Hemoglobin A1C: 5.8 % — AB (ref 4.0–5.6)

## 2018-04-18 LAB — GLUCOSE, POCT (MANUAL RESULT ENTRY): POC Glucose: 102 mg/dl — AB (ref 70–99)

## 2018-04-18 MED ORDER — LATANOPROST 0.005 % OP SOLN: 1.0000 [drp] | Freq: Every day | OPHTHALMIC | 3 refills | Status: DC

## 2018-04-18 MED ORDER — DORZOLAMIDE HCL 2 % OP SOLN: 1.0000 [drp] | Freq: Three times a day (TID) | OPHTHALMIC | 3 refills | Status: DC

## 2018-04-18 MED ORDER — TIMOLOL MALEATE 0.5 % OP SOLN: 1.0000 [drp] | Freq: Two times a day (BID) | OPHTHALMIC | 3 refills | Status: DC

## 2018-04-18 MED ORDER — ALBUTEROL SULFATE (2.5 MG/3ML) 0.083% IN NEBU: INHALATION_SOLUTION | RESPIRATORY_TRACT | 1 refills | Status: DC

## 2018-04-18 MED ORDER — FLUOROMETHOLONE 0.1 % OP SUSP: 1.0000 [drp] | Freq: Every day | OPHTHALMIC | 3 refills | Status: DC

## 2018-04-18 MED FILL — DORZOLAMIDE HCL 2% EYE DROP: 2 | 50 days supply | Qty: 10 | Fill #0

## 2018-04-18 MED FILL — FLUOROMETHOLONE 0.1% DROPS: 0.1 | 30 days supply | Qty: 5 | Fill #0

## 2018-04-18 MED FILL — ALBUTEROL SUL 2.5 MG/3 ML S: (2.5 MG/3ML | 30 days supply | Qty: 360 | Fill #0

## 2018-04-18 MED FILL — TIMOLOL 0.5% EYE DROPS: 0.5 | 30 days supply | Qty: 5 | Fill #0

## 2018-04-18 MED FILL — LATANOPROST 0.005% EYE DRP: 0.005 | 25 days supply | Qty: 3 | Fill #0

## 2018-04-18 NOTE — Progress Notes (Signed)
Assessment & Plan:  Rithvik was seen today for follow-up.  Diagnoses and all orders for this visit:  Essential hypertension -     CMP14+EGFR Continue all antihypertensives as prescribed.  Remember to bring in your blood pressure log with you for your follow up appointment.  DASH/Mediterranean Diets are healthier choices for HTN.    Prediabetes -     Glucose (CBG) -     HgB A1c Continue blood sugar control as discussed in office today, low carbohydrate diet, and regular physical exercise as tolerated, 150 minutes per week (30 min each day, 5 days per week, or 50 min 3 days per week).   Chronic obstructive airway disease with asthma (HCC) -     albuterol (PROVENTIL) (2.5 MG/3ML) 0.083% nebulizer solution; USE 3 MLS (2.5 MG TOTAL) BY NEBULIZATION EVERY 6 (SIX) HOURS AS NEEDED FOR WHEEZING OR SHORTNESS OF BREATH.  Glaucoma of right eye, unspecified glaucoma type -     latanoprost (XALATAN) 0.005 % ophthalmic solution; Place 1 drop into both eyes at bedtime. -     timolol (TIMOPTIC) 0.5 % ophthalmic solution; Place 1 drop into the right eye 2 (two) times daily. -     fluorometholone (FML) 0.1 % ophthalmic suspension; Place 1 drop into both eyes daily. -     dorzolamide (TRUSOPT) 2 % ophthalmic solution; Place 1 drop into the right eye 3 (three) times daily.    Patient has been counseled on age-appropriate routine health concerns for screening and prevention. These are reviewed and up-to-date. Referrals have been placed accordingly. Immunizations are up-to-date or declined.    Subjective:   Chief Complaint  Patient presents with  . Follow-up    Pt. is here to follow-up on hypertension.   HPI Bryan Sullivan 72 y.o. male presents to office today for essential Hypertension.  He is seeing Dr. Joya Gaskins with Pulmonary Disease for his chronic obstructive airway disease with asthma and Ophthalmology for his primary open angle glaucoma.    Essential Hypertension Blood pressure  is well controlled today on amlodipine 22m and cozaar 100 mg daily. He currently denies chest pain, palpitations, lightheadedness, dizziness, headaches or BLE edema. He does endorse mild shortness of breath with exertion related to his chronic respiratory condition. BP Readings from Last 3 Encounters:  04/18/18 137/79  04/07/18 122/77  02/16/18 132/67   Prediabetes Well controlled. A1c down from 6.1; 5.9 to currently 5.8. Controlled with diet and activity. He denies any hypo or hyperglycemic symptoms. Current on eye exam. Lab Results  Component Value Date   HGBA1C 5.8 (A) 04/18/2018      Prediabetes  Lab Results  Component Value Date   HGBA1C 5.8 (A) 04/18/2018   Review of Systems  Constitutional: Negative for fever, malaise/fatigue and weight loss.  HENT: Negative.  Negative for nosebleeds.   Eyes: Negative.  Negative for blurred vision, double vision and photophobia.  Respiratory: Negative.  Negative for cough and shortness of breath.   Cardiovascular: Negative.  Negative for chest pain, palpitations and leg swelling.  Gastrointestinal: Negative.  Negative for heartburn, nausea and vomiting.  Musculoskeletal: Negative.  Negative for myalgias.  Neurological: Negative.  Negative for dizziness, focal weakness, seizures and headaches.  Psychiatric/Behavioral: Negative.  Negative for suicidal ideas.    Past Medical History:  Diagnosis Date  . Asthma    As child   . Enlarged prostate Dx 2001  . Hypertension    Dx at age 72   Past Surgical History:  Procedure Laterality Date  .  CATARACT EXTRACTION W/ INTRAOCULAR LENS  IMPLANT, BILATERAL Bilateral   . CORNEAL TRANSPLANT Right   . EYE SURGERY      Family History  Problem Relation Age of Onset  . Diabetes Mother   . Asthma Father   . Asthma Sister     Social History Reviewed with no changes to be made today.   Outpatient Medications Prior to Visit  Medication Sig Dispense Refill  . albuterol (PROVENTIL  HFA;VENTOLIN HFA) 108 (90 Base) MCG/ACT inhaler Inhale 1-2 puffs into the lungs every 6 (six) hours as needed for wheezing or shortness of breath. 1 Inhaler 3  . amLODipine (NORVASC) 5 MG tablet Take 1 tablet (5 mg total) by mouth daily. 90 tablet 3  . BESIVANCE 0.6 % SUSP Place 1 drop into the right eye 4 (four) times daily.  0  . Cholecalciferol (VITAMIN D-1000 MAX ST) 1000 units tablet Take 1,000 Units by mouth daily.    . finasteride (PROSCAR) 5 MG tablet Take 1 tablet (5 mg total) by mouth daily. 90 tablet 1  . Fluticasone-Salmeterol (ADVAIR) 250-50 MCG/DOSE AEPB Inhale 1 puff into the lungs 2 (two) times daily. 1 each 3  . losartan (COZAAR) 100 MG tablet Take 1 tablet (100 mg total) by mouth daily. 90 tablet 3  . tamsulosin (FLOMAX) 0.4 MG CAPS capsule Take 1 capsule (0.4 mg total) by mouth daily. 30 capsule 11  . albuterol (PROVENTIL) (2.5 MG/3ML) 0.083% nebulizer solution USE 3 MLS (2.5 MG TOTAL) BY NEBULIZATION EVERY 6 (SIX) HOURS AS NEEDED FOR WHEEZING OR SHORTNESS OF BREATH. 1080 mL 1  . dorzolamide (TRUSOPT) 2 % ophthalmic solution Place 1 drop into the right eye 3 (three) times daily. 30 mL 3  . fluorometholone (FML) 0.1 % ophthalmic suspension Place 1 drop into both eyes daily. 15 mL 3  . latanoprost (XALATAN) 0.005 % ophthalmic solution Place 1 drop into both eyes at bedtime. 7.5 mL 3  . timolol (TIMOPTIC) 0.5 % ophthalmic solution Place 1 drop into the right eye 2 (two) times daily. 15 mL 3  . fluticasone (FLONASE) 50 MCG/ACT nasal spray Place 2 sprays into both nostrils daily. (Patient not taking: Reported on 04/07/2018) 16 g 2  . omeprazole (PRILOSEC) 20 MG capsule Take 1 capsule (20 mg total) by mouth daily. 90 capsule 1   No facility-administered medications prior to visit.     No Known Allergies     Objective:    BP 137/79 (BP Location: Right Arm, Patient Position: Sitting, Cuff Size: Normal)   Pulse 75   Temp 98.1 F (36.7 C) (Oral)   Ht 5' 10"  (1.778 m)   Wt 185 lb  (83.9 kg)   SpO2 98%   BMI 26.54 kg/m  Wt Readings from Last 3 Encounters:  04/18/18 185 lb (83.9 kg)  04/07/18 187 lb 8 oz (85 kg)  02/05/18 183 lb 9.6 oz (83.3 kg)    Physical Exam  Constitutional: He is oriented to person, place, and time. He appears well-developed and well-nourished. He is cooperative.  HENT:  Head: Normocephalic and atraumatic.  Eyes: EOM are normal.  Neck: Normal range of motion.  Cardiovascular: Normal rate, regular rhythm, normal heart sounds and intact distal pulses. Exam reveals no gallop and no friction rub.  No murmur heard. Pulmonary/Chest: Effort normal and breath sounds normal. No stridor. No tachypnea. No respiratory distress. He has no decreased breath sounds. He has no wheezes. He has no rhonchi. He has no rales. He exhibits no tenderness.  Abdominal:  Soft. Bowel sounds are normal.  Musculoskeletal: Normal range of motion. He exhibits no edema.  Neurological: He is alert and oriented to person, place, and time. Coordination normal.  Skin: Skin is warm and dry.  Psychiatric: He has a normal mood and affect. His behavior is normal. Judgment and thought content normal.  Nursing note and vitals reviewed.      Patient has been counseled extensively about nutrition and exercise as well as the importance of adherence with medications and regular follow-up. The patient was given clear instructions to go to ER or return to medical center if symptoms don't improve, worsen or new problems develop. The patient verbalized understanding.   Follow-up: Return in about 3 months (around 07/19/2018) for HTN.   Gildardo Pounds, FNP-BC [facility] [place], [place]   04/18/2018, 4:25 PM

## 2018-04-19 LAB — CMP14+EGFR
ALT: 13 IU/L (ref 0–44)
AST: 16 IU/L (ref 0–40)
Albumin/Globulin Ratio: 1.3 (ref 1.2–2.2)
Albumin: 3.8 g/dL (ref 3.5–4.8)
Alkaline Phosphatase: 77 IU/L (ref 39–117)
BUN/Creatinine Ratio: 13 (ref 10–24)
BUN: 14 mg/dL (ref 8–27)
Bilirubin Total: 0.2 mg/dL (ref 0.0–1.2)
CO2: 23 mmol/L (ref 20–29)
Calcium: 9 mg/dL (ref 8.6–10.2)
Chloride: 103 mmol/L (ref 96–106)
Creatinine, Ser: 1.04 mg/dL (ref 0.76–1.27)
GFR calc Af Amer: 81 mL/min/{1.73_m2} (ref >59)
GFR calc non Af Amer: 70 mL/min/{1.73_m2} (ref >59)
Globulin, Total: 3 g/dL (ref 1.5–4.5)
Glucose: 104 mg/dL — ABNORMAL HIGH (ref 65–99)
Potassium: 4.5 mmol/L (ref 3.5–5.2)
Sodium: 140 mmol/L (ref 134–144)
Total Protein: 6.8 g/dL (ref 6.0–8.5)

## 2018-04-30 ENCOUNTER — Telehealth: Payer: Self-pay

## 2018-04-30 MED FILL — AMLODIPINE BESYLATE 5 MG TA: 5 | 30 days supply | Qty: 30 | Fill #9

## 2018-04-30 MED FILL — LOSARTAN POTASSIUM 100 MG T: 100 | 30 days supply | Qty: 30 | Fill #5

## 2018-04-30 MED FILL — FINASTERIDE 5 MG TABLET: 5 | 30 days supply | Qty: 30 | Fill #4

## 2018-04-30 MED FILL — TAMSULOSIN HCL 0.4 MG CAP: 0.4 | 30 days supply | Qty: 30 | Fill #7

## 2018-04-30 NOTE — Telephone Encounter (Signed)
-----   Message from Gildardo Pounds, NP sent at 04/22/2018 10:49 PM EST ----- Labs including sodium, potassium, kidney and liver function are normal.

## 2018-04-30 NOTE — Telephone Encounter (Signed)
CMA spoke to patient's son to inform on results.  Pt's son understood and is aware of results.

## 2018-06-02 MED FILL — TIMOLOL 0.5% EYE DROPS: 0.5 | 30 days supply | Qty: 5 | Fill #1

## 2018-06-02 MED FILL — FLUOROMETHOLONE 0.1% DROPS: 0.1 | 75 days supply | Qty: 10 | Fill #1

## 2018-06-02 MED FILL — FINASTERIDE 5 MG TABLET: 5 | 30 days supply | Qty: 30 | Fill #5

## 2018-06-02 MED FILL — AMLODIPINE BESYLATE 5 MG TA: 5 | 30 days supply | Qty: 30 | Fill #10

## 2018-06-02 MED FILL — LATANOPROST 0.005% EYE DRP: 0.005 | 25 days supply | Qty: 3 | Fill #1

## 2018-06-02 MED FILL — DORZOLAMIDE HCL 2% EYE DRP: 2 | 50 days supply | Qty: 10 | Fill #1

## 2018-06-02 MED FILL — TAMSULOSIN HCL 0.4 MG CAP: 0.4 | 30 days supply | Qty: 30 | Fill #8

## 2018-06-02 MED FILL — LOSARTAN POTASSIUM 100 MG T: 100 | 30 days supply | Qty: 30 | Fill #6

## 2018-06-11 ENCOUNTER — Other Ambulatory Visit: Payer: Self-pay | Admitting: Critical Care Medicine

## 2018-06-20 ENCOUNTER — Ambulatory Visit: Payer: Self-pay | Attending: Nurse Practitioner

## 2018-06-27 ENCOUNTER — Other Ambulatory Visit: Payer: Self-pay | Admitting: Nurse Practitioner

## 2018-06-27 DIAGNOSIS — R3914 Feeling of incomplete bladder emptying: Principal | ICD-10-CM

## 2018-06-27 DIAGNOSIS — N401 Enlarged prostate with lower urinary tract symptoms: Secondary | ICD-10-CM

## 2018-06-27 MED FILL — TAMSULOSIN HCL 0.4 MG CAP: 0.4 | 30 days supply | Qty: 30 | Fill #9

## 2018-06-27 MED FILL — LOSARTAN POTASSIUM 100 MG T: 100 | 30 days supply | Qty: 30 | Fill #7

## 2018-06-27 MED FILL — AMLODIPINE BESYLATE 5 MG TA: 5 | 30 days supply | Qty: 30 | Fill #11

## 2018-06-30 ENCOUNTER — Ambulatory Visit: Payer: Self-pay | Admitting: Critical Care Medicine

## 2018-06-30 MED FILL — FINASTERIDE 5 MG TABLET: 5 | 30 days supply | Qty: 30 | Fill #0

## 2018-07-21 ENCOUNTER — Ambulatory Visit: Payer: Self-pay | Attending: Nurse Practitioner | Admitting: Nurse Practitioner

## 2018-07-21 ENCOUNTER — Encounter: Payer: Self-pay | Admitting: Nurse Practitioner

## 2018-07-21 VITALS — BP 139/77 | HR 91 | Temp 98.1°F | Ht 70.0 in | Wt 186.8 lb

## 2018-07-21 DIAGNOSIS — J4489 Other specified chronic obstructive pulmonary disease: Secondary | ICD-10-CM

## 2018-07-21 DIAGNOSIS — N401 Enlarged prostate with lower urinary tract symptoms: Secondary | ICD-10-CM

## 2018-07-21 DIAGNOSIS — R3914 Feeling of incomplete bladder emptying: Secondary | ICD-10-CM

## 2018-07-21 DIAGNOSIS — R7303 Prediabetes: Secondary | ICD-10-CM

## 2018-07-21 DIAGNOSIS — J449 Chronic obstructive pulmonary disease, unspecified: Secondary | ICD-10-CM

## 2018-07-21 DIAGNOSIS — I10 Essential (primary) hypertension: Secondary | ICD-10-CM

## 2018-07-21 DIAGNOSIS — H409 Unspecified glaucoma: Secondary | ICD-10-CM

## 2018-07-21 LAB — GLUCOSE, POCT (MANUAL RESULT ENTRY): POC Glucose: 163 mg/dl — AB (ref 70–99)

## 2018-07-21 MED ORDER — LATANOPROST 0.005 % OP SOLN: 1.0000 [drp] | Freq: Every day | OPHTHALMIC | 3 refills | Status: DC

## 2018-07-21 MED ORDER — IPRATROPIUM-ALBUTEROL 0.5-2.5 (3) MG/3ML IN SOLN
3.0000 mL | Freq: Once | RESPIRATORY_TRACT | Status: AC
  Administered 2018-07-21: 3 mL via RESPIRATORY_TRACT

## 2018-07-21 MED ORDER — PREDNISONE 10 MG PO TABS: 30.0000 mg | ORAL_TABLET | Freq: Every day | ORAL | 0 refills | Status: AC

## 2018-07-21 MED ORDER — DORZOLAMIDE HCL 2 % OP SOLN: 1.0000 [drp] | Freq: Three times a day (TID) | OPHTHALMIC | 3 refills | Status: DC

## 2018-07-21 MED ORDER — TIMOLOL MALEATE 0.5 % OP SOLN: 1.0000 [drp] | Freq: Two times a day (BID) | OPHTHALMIC | 3 refills | Status: DC

## 2018-07-21 MED ORDER — FLUOROMETHOLONE 0.1 % OP SUSP: 1.0000 [drp] | Freq: Every day | OPHTHALMIC | 3 refills | Status: DC

## 2018-07-21 MED ORDER — ALBUTEROL SULFATE (2.5 MG/3ML) 0.083% IN NEBU: INHALATION_SOLUTION | RESPIRATORY_TRACT | 1 refills | Status: DC

## 2018-07-21 MED ORDER — FINASTERIDE 5 MG PO TABS: 5.0000 mg | ORAL_TABLET | Freq: Every day | ORAL | 1 refills | Status: AC

## 2018-07-21 MED ORDER — AMLODIPINE BESYLATE 5 MG PO TABS: 5.0000 mg | ORAL_TABLET | Freq: Every day | ORAL | 3 refills | Status: DC

## 2018-07-21 NOTE — Progress Notes (Addendum)
Assessment & Plan:  Bryan Sullivan was seen today for follow-up.  Diagnoses and all orders for this visit:  Prediabetes -     Glucose (CBG) Continue blood sugar control as discussed in office today, low carbohydrate diet, and regular physical exercise as tolerated, 150 minutes per week (30 min each day, 5 days per week, or 50 min 3 days per week).    Chronic obstructive airway disease with asthma ([place]) Noted for improvement in chest tightness after duoneb treatment. Will send home with short term dose of prednisone -     albuterol (PROVENTIL) (2.5 MG/3ML) 0.083% nebulizer solution; USE 3 MLS (2.5 MG TOTAL) BY NEBULIZATION EVERY 6 (SIX) HOURS AS NEEDED FOR WHEEZING OR SHORTNESS OF BREATH. -     predniSONE (DELTASONE) 10 MG tablet; Take 3 tablets (30 mg total) by mouth daily with breakfast for 5 days. -     ipratropium-albuterol (DUONEB) 0.5-2.5 (3) MG/3ML nebulizer solution 3 mL  Essential hypertension -     amLODipine (NORVASC) 5 MG tablet; Take 1 tablet (5 mg total) by mouth daily. Continue all antihypertensives as prescribed.  Remember to bring in your blood pressure log with you for your follow up appointment.  DASH/Mediterranean Diets are healthier choices for HTN.    Benign prostatic hyperplasia with incomplete bladder emptying -     finasteride (PROSCAR) 5 MG tablet; Take 1 tablet (5 mg total) by mouth daily.  Glaucoma of right eye, unspecified glaucoma type -     timolol (TIMOPTIC) 0.5 % ophthalmic solution; Place 1 drop into the right eye 2 (two) times daily. -     latanoprost (XALATAN) 0.005 % ophthalmic solution; Place 1 drop into both eyes at bedtime. -     fluorometholone (FML) 0.1 % ophthalmic suspension; Place 1 drop into both eyes daily. -     dorzolamide (TRUSOPT) 2 % ophthalmic solution; Place 1 drop into the right eye 3 (three) times daily.    Patient has been counseled on age-appropriate routine health concerns for screening and prevention. These are reviewed and  up-to-date. Referrals have been placed accordingly. Immunizations are up-to-date or declined.    Subjective:   Chief Complaint  Patient presents with  . Follow-up    Pt. is here for hypertension follow-up.    HPI Bryan Sullivan 73 y.o. male presents to office today for follow up. He has complaints of difficulty with air movement, feeling tightness in chest with non productive cough. He is using his inhalers as prescribed which provide some relief of his symptoms.   Prediabetes Controlled and stable. Diet controlled. He is taking an ARB. He denies any hypo or hyperglycemic symptoms.  Lab Results  Component Value Date   HGBA1C 5.8 (A) 04/18/2018   Essential Hypertension  Well controlled. Taking Losartan 100 mg daily and amlodipine 5mg  daily. He denies chest pain, shortness of breath, palpitations, lightheadedness, dizziness, headaches or BLE edema.  BP Readings from Last 3 Encounters:  07/21/18 139/77  04/18/18 137/79  04/07/18 122/77    Review of Systems  Constitutional: Negative for fever, malaise/fatigue and weight loss.  HENT: Negative.  Negative for nosebleeds.   Eyes: Negative for blurred vision, double vision and photophobia.       Glaucoma  Respiratory: Positive for cough and shortness of breath. Negative for sputum production and wheezing.   Cardiovascular: Negative.  Negative for chest pain, palpitations and leg swelling.  Gastrointestinal: Negative.  Negative for heartburn, nausea and vomiting.  Genitourinary:       BPH  Musculoskeletal: Negative.  Negative for myalgias.  Neurological: Negative.  Negative for dizziness, focal weakness, seizures and headaches.  Psychiatric/Behavioral: Negative.  Negative for suicidal ideas.    Past Medical History:  Diagnosis Date  . Asthma    As child   . Enlarged prostate Dx 2001  . Hypertension    Dx at age 37    Past Surgical History:  Procedure Laterality Date  . CATARACT EXTRACTION W/ INTRAOCULAR LENS  IMPLANT,  BILATERAL Bilateral   . CORNEAL TRANSPLANT Right   . EYE SURGERY      Family History  Problem Relation Age of Onset  . Diabetes Mother   . Asthma Father   . Asthma Sister     Social History Reviewed with no changes to be made today.   Outpatient Medications Prior to Visit  Medication Sig Dispense Refill  . albuterol (PROVENTIL HFA;VENTOLIN HFA) 108 (90 Base) MCG/ACT inhaler Inhale 1-2 puffs into the lungs every 6 (six) hours as needed for wheezing or shortness of breath. 1 Inhaler 3  . Fluticasone-Salmeterol (ADVAIR) 250-50 MCG/DOSE AEPB Inhale 1 puff into the lungs 2 (two) times daily. 1 each 3  . losartan (COZAAR) 100 MG tablet Take 1 tablet (100 mg total) by mouth daily. 90 tablet 3  . albuterol (PROVENTIL) (2.5 MG/3ML) 0.083% nebulizer solution USE 3 MLS (2.5 MG TOTAL) BY NEBULIZATION EVERY 6 (SIX) HOURS AS NEEDED FOR WHEEZING OR SHORTNESS OF BREATH. 1080 mL 1  . amLODipine (NORVASC) 5 MG tablet Take 1 tablet (5 mg total) by mouth daily. 90 tablet 3  . dorzolamide (TRUSOPT) 2 % ophthalmic solution Place 1 drop into the right eye 3 (three) times daily. 30 mL 3  . finasteride (PROSCAR) 5 MG tablet TAKE 1 TABLET BY MOUTH DAILY. 30 tablet 0  . fluorometholone (FML) 0.1 % ophthalmic suspension Place 1 drop into both eyes daily. 15 mL 3  . latanoprost (XALATAN) 0.005 % ophthalmic solution Place 1 drop into both eyes at bedtime. 7.5 mL 3  . timolol (TIMOPTIC) 0.5 % ophthalmic solution Place 1 drop into the right eye 2 (two) times daily. 15 mL 3  . BESIVANCE 0.6 % SUSP Place 1 drop into the right eye 4 (four) times daily.  0  . Cholecalciferol (VITAMIN D-1000 MAX ST) 1000 units tablet Take 1,000 Units by mouth daily.    . fluticasone (FLONASE) 50 MCG/ACT nasal spray Place 2 sprays into both nostrils daily. (Patient not taking: Reported on 04/07/2018) 16 g 2  . omeprazole (PRILOSEC) 20 MG capsule Take 1 capsule (20 mg total) by mouth daily. 90 capsule 1  . tamsulosin (FLOMAX) 0.4 MG CAPS  capsule Take 1 capsule (0.4 mg total) by mouth daily. (Patient not taking: Reported on 07/21/2018) 30 capsule 11   No facility-administered medications prior to visit.     No Known Allergies     Objective:    BP 139/77 (BP Location: Left Arm, Patient Position: Sitting, Cuff Size: Normal)   Pulse 91   Temp 98.1 F (36.7 C) (Oral)   Ht 5\' 10"  (1.778 m)   Wt 186 lb 12.8 oz (84.7 kg)   SpO2 98%   BMI 26.80 kg/m  Wt Readings from Last 3 Encounters:  07/21/18 186 lb 12.8 oz (84.7 kg)  04/18/18 185 lb (83.9 kg)  04/07/18 187 lb 8 oz (85 kg)    Physical Exam Vitals signs and nursing note reviewed.  Constitutional:      Appearance: He is well-developed.  HENT:  Head: Normocephalic and atraumatic.  Neck:     Musculoskeletal: Normal range of motion.  Cardiovascular:     Rate and Rhythm: Normal rate and regular rhythm.     Heart sounds: Normal heart sounds. No murmur. No friction rub. No gallop.   Pulmonary:     Effort: Pulmonary effort is normal. No tachypnea, accessory muscle usage or respiratory distress.     Breath sounds: Decreased air movement present. No decreased breath sounds, wheezing, rhonchi or rales.  Chest:     Chest wall: No tenderness.  Abdominal:     General: Bowel sounds are normal.     Palpations: Abdomen is soft.  Musculoskeletal: Normal range of motion.  Skin:    General: Skin is warm and dry.  Neurological:     Mental Status: He is alert and oriented to person, place, and time.     Coordination: Coordination normal.  Psychiatric:        Behavior: Behavior normal. Behavior is cooperative.        Thought Content: Thought content normal.        Judgment: Judgment normal.        Patient has been counseled extensively about nutrition and exercise as well as the importance of adherence with medications and regular follow-up. The patient was given clear instructions to go to ER or return to medical center if symptoms don't improve, worsen or new problems  develop. The patient verbalized understanding.   Follow-up: Return in about 3 months (around 10/19/2018) for htn.   Gildardo Pounds, FNP-BC [facility] [place], [place]   07/24/2018, 11:41 PM

## 2018-07-22 MED FILL — FLUOROMETHOLONE 0.1% DROPS: 0.1 | 74 days supply | Qty: 10 | Fill #0

## 2018-07-22 MED FILL — predniSONE 10 MG TABS: 10 | 5 days supply | Qty: 15 | Fill #0

## 2018-07-22 MED FILL — TIMOLOL 0.5% EYE DROPS: 0.5 | 37 days supply | Qty: 5 | Fill #0

## 2018-07-22 MED FILL — LATANOPROST 0.005% EYE DRP: 0.005 | 18 days supply | Qty: 3 | Fill #0

## 2018-07-22 MED FILL — AMLODIPINE BESYLATE 5 MG TA: 5 | 90 days supply | Qty: 90 | Fill #0

## 2018-07-22 MED FILL — ALBUTEROL SUL 2.5 MG/3 ML S: (2.5 MG/3ML | 22 days supply | Qty: 225 | Fill #0

## 2018-07-22 MED FILL — DORZOLAMIDE HCL 2% EYE DRP: 2 | 50 days supply | Qty: 10 | Fill #0

## 2018-07-24 ENCOUNTER — Encounter: Payer: Self-pay | Admitting: Nurse Practitioner

## 2018-07-29 MED FILL — FINASTERIDE 5 MG TABLET: 5 | 30 days supply | Qty: 30 | Fill #0

## 2018-07-29 MED FILL — TAMSULOSIN HCL 0.4 MG CAP: 0.4 | 30 days supply | Qty: 30 | Fill #10

## 2018-07-29 MED FILL — LOSARTAN POTASSIUM 100 MG T: 100 | 30 days supply | Qty: 30 | Fill #8

## 2018-08-20 ENCOUNTER — Encounter (HOSPITAL_COMMUNITY): Payer: Self-pay

## 2018-08-20 ENCOUNTER — Observation Stay (HOSPITAL_COMMUNITY)
Admission: EM | Admit: 2018-08-20 | Discharge: 2018-08-21 | Disposition: A | Discharge status: Home / Self Care | Payer: Medicare Other | Attending: Internal Medicine | Admitting: Internal Medicine

## 2018-08-20 ENCOUNTER — Emergency Department (HOSPITAL_COMMUNITY): Payer: Medicare Other

## 2018-08-20 ENCOUNTER — Other Ambulatory Visit: Payer: Self-pay

## 2018-08-20 DIAGNOSIS — R0789 Other chest pain: Principal | ICD-10-CM | POA: Insufficient documentation

## 2018-08-20 DIAGNOSIS — N400 Enlarged prostate without lower urinary tract symptoms: Secondary | ICD-10-CM | POA: Insufficient documentation

## 2018-08-20 DIAGNOSIS — J45909 Unspecified asthma, uncomplicated: Secondary | ICD-10-CM | POA: Insufficient documentation

## 2018-08-20 DIAGNOSIS — I700 Atherosclerosis of aorta: Secondary | ICD-10-CM | POA: Insufficient documentation

## 2018-08-20 DIAGNOSIS — Z79899 Other long term (current) drug therapy: Secondary | ICD-10-CM | POA: Insufficient documentation

## 2018-08-20 DIAGNOSIS — R079 Chest pain, unspecified: Secondary | ICD-10-CM | POA: Diagnosis present

## 2018-08-20 DIAGNOSIS — I2510 Atherosclerotic heart disease of native coronary artery without angina pectoris: Secondary | ICD-10-CM | POA: Insufficient documentation

## 2018-08-20 DIAGNOSIS — R072 Precordial pain: Secondary | ICD-10-CM | POA: Diagnosis not present

## 2018-08-20 DIAGNOSIS — Z87891 Personal history of nicotine dependence: Secondary | ICD-10-CM | POA: Insufficient documentation

## 2018-08-20 DIAGNOSIS — I10 Essential (primary) hypertension: Secondary | ICD-10-CM | POA: Diagnosis not present

## 2018-08-20 DIAGNOSIS — H409 Unspecified glaucoma: Secondary | ICD-10-CM

## 2018-08-20 DIAGNOSIS — R7303 Prediabetes: Secondary | ICD-10-CM

## 2018-08-20 LAB — CBC WITH DIFFERENTIAL/PLATELET
Abs Immature Granulocytes: 0.02 10*3/uL (ref 0.00–0.07)
Basophils Absolute: 0 10*3/uL (ref 0.0–0.1)
Basophils Relative: 0 %
Eosinophils Absolute: 0.1 10*3/uL (ref 0.0–0.5)
Eosinophils Relative: 1 %
HCT: 36.8 % — ABNORMAL LOW (ref 39.0–52.0)
Hemoglobin: 11.9 g/dL — ABNORMAL LOW (ref 13.0–17.0)
Immature Granulocytes: 0 %
Lymphocytes Relative: 15 %
Lymphs Abs: 1.2 10*3/uL (ref 0.7–4.0)
MCH: 27.5 pg (ref 26.0–34.0)
MCHC: 32.3 g/dL (ref 30.0–36.0)
MCV: 85 fL (ref 80.0–100.0)
Monocytes Absolute: 0.7 10*3/uL (ref 0.1–1.0)
Monocytes Relative: 9 %
Neutro Abs: 5.6 10*3/uL (ref 1.7–7.7)
Neutrophils Relative %: 75 %
Platelets: 231 10*3/uL (ref 150–400)
RBC: 4.33 MIL/uL (ref 4.22–5.81)
RDW: 12.8 % (ref 11.5–15.5)
WBC: 7.5 10*3/uL (ref 4.0–10.5)
nRBC: 0 % (ref 0.0–0.2)

## 2018-08-20 LAB — COMPREHENSIVE METABOLIC PANEL
ALT: 18 U/L (ref 0–44)
AST: 19 U/L (ref 15–41)
Albumin: 3.5 g/dL (ref 3.5–5.0)
Alkaline Phosphatase: 58 U/L (ref 38–126)
Anion gap: 8 (ref 5–15)
BUN: 17 mg/dL (ref 8–23)
CO2: 26 mmol/L (ref 22–32)
Calcium: 9.1 mg/dL (ref 8.9–10.3)
Chloride: 104 mmol/L (ref 98–111)
Creatinine, Ser: 1.13 mg/dL (ref 0.61–1.24)
GFR calc Af Amer: >60 mL/min (ref >60)
GFR calc non Af Amer: >60 mL/min (ref >60)
Glucose, Bld: 113 mg/dL — ABNORMAL HIGH (ref 70–99)
Potassium: 4.3 mmol/L (ref 3.5–5.1)
Sodium: 138 mmol/L (ref 135–145)
Total Bilirubin: 0.8 mg/dL (ref 0.3–1.2)
Total Protein: 6.4 g/dL — ABNORMAL LOW (ref 6.5–8.1)

## 2018-08-20 LAB — TROPONIN I
Troponin I: <0.03 ng/mL (ref <0.03)
Troponin I: <0.03 ng/mL (ref <0.03)
Troponin I: <0.03 ng/mL (ref <0.03)

## 2018-08-20 MED ORDER — DORZOLAMIDE HCL 2 % OP SOLN
1.0000 [drp] | Freq: Three times a day (TID) | OPHTHALMIC | Status: DC
  Administered 2018-08-20 – 2018-08-21 (×3): 1 [drp] via OPHTHALMIC
  Filled 2018-08-20: qty 10

## 2018-08-20 MED ORDER — SODIUM CHLORIDE 0.9% FLUSH
3.0000 mL | Freq: Two times a day (BID) | INTRAVENOUS | Status: DC
  Administered 2018-08-20 – 2018-08-21 (×3): 3 mL via INTRAVENOUS

## 2018-08-20 MED ORDER — POLYETHYLENE GLYCOL 3350 17 G PO PACK: 17.0000 g | PACK | Freq: Every day | ORAL | Status: DC | PRN

## 2018-08-20 MED ORDER — LIDOCAINE VISCOUS HCL 2 % MT SOLN
15.0000 mL | Freq: Once | OROMUCOSAL | Status: AC
  Administered 2018-08-20: 15 mL via ORAL
  Filled 2018-08-20: qty 15

## 2018-08-20 MED ORDER — ASPIRIN 81 MG PO CHEW
162.0000 mg | CHEWABLE_TABLET | Freq: Once | ORAL | Status: AC
  Administered 2018-08-20: 162 mg via ORAL
  Filled 2018-08-20: qty 2

## 2018-08-20 MED ORDER — NITROGLYCERIN 0.4 MG SL SUBL
0.4000 mg | SUBLINGUAL_TABLET | SUBLINGUAL | Status: DC | PRN
  Administered 2018-08-20: 0.4 mg via SUBLINGUAL
  Filled 2018-08-20 (×2): qty 1

## 2018-08-20 MED ORDER — METOPROLOL TARTRATE 5 MG/5ML IV SOLN
5.0000 mg | INTRAVENOUS | Status: DC | PRN
  Administered 2018-08-21 (×5): 5 mg via INTRAVENOUS
  Filled 2018-08-20 (×3): qty 5

## 2018-08-20 MED ORDER — NITROGLYCERIN 0.4 MG SL SUBL: 0.4000 mg | SUBLINGUAL_TABLET | SUBLINGUAL | Status: DC | PRN

## 2018-08-20 MED ORDER — METOPROLOL TARTRATE 25 MG PO TABS
25.0000 mg | ORAL_TABLET | Freq: Two times a day (BID) | ORAL | Status: DC
  Administered 2018-08-20 – 2018-08-21 (×2): 25 mg via ORAL
  Filled 2018-08-20 (×2): qty 1

## 2018-08-20 MED ORDER — TAMSULOSIN HCL 0.4 MG PO CAPS
0.4000 mg | ORAL_CAPSULE | Freq: Every day | ORAL | Status: DC
  Administered 2018-08-21: 0.4 mg via ORAL
  Filled 2018-08-20: qty 1

## 2018-08-20 MED ORDER — ACETAMINOPHEN 325 MG PO TABS: 650.0000 mg | ORAL_TABLET | Freq: Four times a day (QID) | ORAL | Status: DC | PRN

## 2018-08-20 MED ORDER — METOPROLOL TARTRATE 25 MG PO TABS
25.0000 mg | ORAL_TABLET | Freq: Once | ORAL | Status: AC
  Administered 2018-08-20: 25 mg via ORAL
  Filled 2018-08-20: qty 1

## 2018-08-20 MED ORDER — FLUOROMETHOLONE 0.1 % OP SUSP
1.0000 [drp] | Freq: Every day | OPHTHALMIC | Status: DC
  Administered 2018-08-21: 1 [drp] via OPHTHALMIC
  Filled 2018-08-20: qty 5

## 2018-08-20 MED ORDER — ENOXAPARIN SODIUM 40 MG/0.4ML ~~LOC~~ SOLN
40.0000 mg | SUBCUTANEOUS | Status: DC
  Filled 2018-08-20: qty 0.4

## 2018-08-20 MED ORDER — ACETAMINOPHEN 650 MG RE SUPP: 650.0000 mg | Freq: Four times a day (QID) | RECTAL | Status: DC | PRN

## 2018-08-20 MED ORDER — ASPIRIN EC 81 MG PO TBEC
81.0000 mg | DELAYED_RELEASE_TABLET | Freq: Every day | ORAL | Status: DC
  Administered 2018-08-21: 81 mg via ORAL
  Filled 2018-08-20: qty 1

## 2018-08-20 MED ORDER — LATANOPROST 0.005 % OP SOLN
1.0000 [drp] | Freq: Every day | OPHTHALMIC | Status: DC
  Administered 2018-08-20: 1 [drp] via OPHTHALMIC
  Filled 2018-08-20: qty 2.5

## 2018-08-20 MED ORDER — METOPROLOL TARTRATE 25 MG PO TABS: 25.0000 mg | ORAL_TABLET | Freq: Two times a day (BID) | ORAL | Status: DC

## 2018-08-20 MED ORDER — TIMOLOL MALEATE 0.5 % OP SOLN
1.0000 [drp] | Freq: Two times a day (BID) | OPHTHALMIC | Status: DC
  Administered 2018-08-21: 1 [drp] via OPHTHALMIC
  Filled 2018-08-20: qty 5

## 2018-08-20 MED ORDER — ALUM & MAG HYDROXIDE-SIMETH 200-200-20 MG/5ML PO SUSP
30.0000 mL | Freq: Once | ORAL | Status: AC
  Administered 2018-08-20: 30 mL via ORAL
  Filled 2018-08-20: qty 30

## 2018-08-20 MED ORDER — FINASTERIDE 5 MG PO TABS
5.0000 mg | ORAL_TABLET | Freq: Every day | ORAL | Status: DC
  Administered 2018-08-21: 5 mg via ORAL
  Filled 2018-08-20: qty 1

## 2018-08-20 NOTE — ED Triage Notes (Signed)
C/o centralized chest pain starting about 3 this AM, no radiation, SOB, nausea, or dizziness

## 2018-08-20 NOTE — H&P (Addendum)
Date: 08/20/2018               Patient Name:  Bryan Sullivan MRN: [id number]  DOB: 10-23-1945 Age / Sex: 73 y.o., male   PCP: Gildardo Pounds, NP         Medical Service: Internal Medicine Teaching Service         Attending Physician: Dr. Aldine Contes, MD    First Contact: Dr. Annie Paras Pager: [phone]  Second Contact: Dr. Berline Lopes Pager: [phone]       After Hours (After 5p/  First Contact Pager: [phone]  weekends / holidays): Second Contact Pager: [phone]   Chief Complaint: chest pain   History of Present Illness: Bryan Sullivan is a 73 yo man with a medical history of HTN, asthma, prediabetes, BPH, and glaucoma of the right eye who presented to the ED with acute onset of chest pain while he was sleeping last night. He describes the pain as 10/10 stabbing pain in the center of his chest. The pain is non-radiating. He had no associated symptoms including dyspnea, diaphoresis, nausea, or vomiting. The pain was constant until he got nitroglycerin. The pain then decreased to 6/10 and has since been intermittent. The pain did not worsen when he walked to the bathroom, but he did feel weak and tired while walking. He has never had pain like this before. He denies a personal or family history of heart disease. He states he last ate at 2pm yesterday, denies burning chest pain, and denies history of reflux. He reports good compliance with his home medications, which he took this morning. He also took aspirin this morning. He denies fevers, headache, vision changes, or leg swelling. The patient's first language is Arabic, but he speaks adequate [place]. His son was present at bedside and assisted with translation when needed.   Upon arrival to the ED, the patient was hypertensive to the 182X systolic. His chest pain improved after receiving nitroglycerin, but his blood pressure decreased to 937 systolic. Labs significant for Hb 11.9 (at baseline) and negative troponin. EKG showed NSR with  left axis deviation and left anterior fascicular block. CXR without edema or opacities.  Meds:  Current Meds  Medication Sig  . albuterol (PROVENTIL HFA;VENTOLIN HFA) 108 (90 Base) MCG/ACT inhaler Inhale 1-2 puffs into the lungs every 6 (six) hours as needed for wheezing or shortness of breath.  Marland Kitchen albuterol (PROVENTIL) (2.5 MG/3ML) 0.083% nebulizer solution USE 3 MLS (2.5 MG TOTAL) BY NEBULIZATION EVERY 6 (SIX) HOURS AS NEEDED FOR WHEEZING OR SHORTNESS OF BREATH. (Patient taking differently: Take 2.5 mg by nebulization every 6 (six) hours as needed for wheezing or shortness of breath. Marland Kitchen)  . amLODipine (NORVASC) 5 MG tablet Take 1 tablet (5 mg total) by mouth daily.  . dorzolamide (TRUSOPT) 2 % ophthalmic solution Place 1 drop into the right eye 3 (three) times daily.  . finasteride (PROSCAR) 5 MG tablet Take 1 tablet (5 mg total) by mouth daily.  . fluorometholone (FML) 0.1 % ophthalmic suspension Place 1 drop into both eyes daily.  Marland Kitchen latanoprost (XALATAN) 0.005 % ophthalmic solution Place 1 drop into both eyes at bedtime.  Marland Kitchen losartan (COZAAR) 100 MG tablet Take 1 tablet (100 mg total) by mouth daily.  . Multiple Vitamin (MULTIVITAMIN) tablet Take 1 tablet by mouth daily.  . tamsulosin (FLOMAX) 0.4 MG CAPS capsule Take 1 capsule (0.4 mg total) by mouth daily.  . timolol (TIMOPTIC) 0.5 % ophthalmic solution Place 1 drop into the right  eye 2 (two) times daily.   Allergies: Allergies as of 08/20/2018  . (No Known Allergies)   Past Medical History:  Diagnosis Date  . Asthma    As child   . Enlarged prostate Dx 2001  . Hypertension    Dx at age 47   Past Surgical History:  Procedure Laterality Date  . CATARACT EXTRACTION W/ INTRAOCULAR LENS  IMPLANT, BILATERAL Bilateral   . CORNEAL TRANSPLANT Right   . EYE SURGERY      Family History:  Family History  Problem Relation Age of Onset  . Diabetes Mother   . Asthma Father   . Asthma Sister   No known family history of heart disease   Social History: Lives with his son. Performs all ADLs independently. Quit smoking and drinking in the 1970s. Denies illicit drug use.   Review of Systems: A complete ROS was negative except as per HPI.   Physical Exam: Blood pressure 140/76, pulse 80, temperature 98.5 F (36.9 C), temperature source Oral, resp. rate 17, height 5\' 10"  (1.778 m), weight 83.5 kg, SpO2 99 %.  Constitutional: Alert and oriented, no distress. Eyes: Corneal clouding of the right eye s/p corneal transplant. PERRL. EOMI. Cardiovascular: Normal rate and regular rhythm. No murmurs, rubs, or gallops. No reproducible chest pain. Pulmonary/Chest: Effort normal. Clear to auscultation bilaterally. No wheezes, rales, or rhonchi. Abdominal: Bowel sounds present. Soft, non-distended, non-tender. Ext: No lower extremity edema. Skin: Warm and dry. No rashes or wounds.  EKG: personally reviewed my interpretation is NSR with LAD and LAFB.  CXR: personally reviewed my interpretation is no edema or opacities.  Assessment & Plan by Problem: Active Problems:   Chest pain  Chest Pain: Bryan Sullivan is a 73 yo man with a medical history of HTN, asthma, prediabetes, BPH, and glaucoma of the right eye who presented to the ED with acute onset of substernal chest pain that was partially relieved by nitroglycerin. Initial troponin negative. Initial EKG without new ischemic changes compared to prior. His pain is typical in that it improved with nitroglycerin, however it does not seem to be exertional and he has no associated symptoms. Heart score is 4. Discussed case with cardiology who recommended observation and serial troponins. DDx also includes GERD and MSK. No signs/symptoms of pneumonia or asthma exacerbation.  Plan - Tele - Repeat EKG - Trend troponins. If troponin elevates, he will need a cardiac cath with cards. If troponin stays negative, consider CTA coronary.  - Aspirin 81mg  daily - Nitroglycerin prn for chest pain - Hold  home amlodipine 5mg  and losartan 100mg  while using nitroglycerin  - GI cocktail   Asthma: Home albuterol inhaler prn BPH: Continue home finasteride and tamsulosin Glaucoma: continue home eye drops   FEN: no IV fluids, Heart healthy diet, replace electrolytes as needed  DVT ppx: Lovenox Code status: FULL code  Dispo: Admit patient to Observation with expected length of stay less than 2 midnights.  Signed: Corinne Ports, MD 08/20/2018, 12:50 PM  Pager: [phone]

## 2018-08-20 NOTE — Progress Notes (Signed)
Dr. Tamala Julian discussed Cardiac CTA with IM and if neg troponins will have cardiac CTA in AM  -  HR on EKG is 101 will add lopressor 25 mg BID for HR control for study - will need order for SL NTG as well.  I will add and RN will notify primary team.

## 2018-08-20 NOTE — ED Provider Notes (Signed)
[facility] EMERGENCY DEPARTMENT Provider Note   CSN: [id number] Arrival date & time: 08/20/18  8099    History   Chief Complaint Chief Complaint  Patient presents with  . Chest Pain    HPI Bryan Sullivan is a 73 y.o. male.     HPI   A 73 year old patient with a history of treated diabetes and hypertension presents for evaluation of chest pain. Initial onset of pain was approximately 3-6 hours ago. The patient's chest pain is well-localized, is described as heaviness/pressure/tightness, is sharp and is worse with exertion. The patient's chest pain is middle- or left-sided and does not radiate to the arms/jaw/neck. The patient does not complain of nausea and denies diaphoresis. The patient has no history of stroke, has no history of peripheral artery disease, has not smoked in the past 90 days, has no relevant family history of coronary artery disease (first degree relative at less than age 25), has no history of hypercholesterolemia and does not have an elevated BMI (>=30).  Patient denies any recent immobilization, hospitalization, long distance travel, history DVT/PE, cancer history, hormone use.  Denies hemoptysis or lower extremity edema or calf tenderness.  No fevers, chills, productive cough or exposure to individuals with respiratory disease or high risk for COVID-19.  Patient took 81 mg of aspirin this morning per his regular dose.  Patient's son was used as Optometrist per the patient's request. He was offered [employer] video translation services.   Past Medical History:  Diagnosis Date  . Asthma    As child   . Enlarged prostate Dx 2001  . Hypertension    Dx at age 75    Patient Active Problem List   Diagnosis Date Noted  . Gastroesophageal reflux disease 10/08/2016  . Glaucoma (increased eye pressure) 12/16/2014  . Lumbar back pain 12/16/2014  . BPH (benign prostatic hyperplasia) 11/24/2014  . DJD (degenerative joint disease) of knee 11/24/2014   . Chronic obstructive airway disease with asthma ([place]) 10/11/2014  . Essential hypertension 09/22/2014  . Open angle with borderline findings and high glaucoma risk in left eye 08/24/2014  . After cataract 02/10/2013  . Lumbar radiculopathy 12/30/2012  . Pseudophakia of both eyes 07/29/2012  . Corneal transplant failure 07/15/2012    Past Surgical History:  Procedure Laterality Date  . CATARACT EXTRACTION W/ INTRAOCULAR LENS  IMPLANT, BILATERAL Bilateral   . CORNEAL TRANSPLANT Right   . EYE SURGERY          Home Medications    Prior to Admission medications   Medication Sig Start Date End Date Taking? Authorizing Provider  albuterol (PROVENTIL HFA;VENTOLIN HFA) 108 (90 Base) MCG/ACT inhaler Inhale 1-2 puffs into the lungs every 6 (six) hours as needed for wheezing or shortness of breath. 03/26/18   Gildardo Pounds, NP  albuterol (PROVENTIL) (2.5 MG/3ML) 0.083% nebulizer solution USE 3 MLS (2.5 MG TOTAL) BY NEBULIZATION EVERY 6 (SIX) HOURS AS NEEDED FOR WHEEZING OR SHORTNESS OF BREATH. 07/21/18   Gildardo Pounds, NP  amLODipine (NORVASC) 5 MG tablet Take 1 tablet (5 mg total) by mouth daily. 07/21/18   Gildardo Pounds, NP  BESIVANCE 0.6 % SUSP Place 1 drop into the right eye 4 (four) times daily. 02/18/18   [provider]  Cholecalciferol (VITAMIN D-1000 MAX ST) 1000 units tablet Take 1,000 Units by mouth daily. 07/23/16   [provider]  dorzolamide (TRUSOPT) 2 % ophthalmic solution Place 1 drop into the right eye 3 (three) times daily. 07/21/18  Gildardo Pounds, NP  finasteride (PROSCAR) 5 MG tablet Take 1 tablet (5 mg total) by mouth daily. 07/21/18 10/19/18  Gildardo Pounds, NP  fluorometholone (FML) 0.1 % ophthalmic suspension Place 1 drop into both eyes daily. 07/21/18   Gildardo Pounds, NP  fluticasone (FLONASE) 50 MCG/ACT nasal spray Place 2 sprays into both nostrils daily. Patient not taking: Reported on 04/07/2018 07/26/16   Argentina Donovan, PA-C   Fluticasone-Salmeterol (ADVAIR) 250-50 MCG/DOSE AEPB Inhale 1 puff into the lungs 2 (two) times daily. 02/05/18   Elsie Stain, MD  latanoprost (XALATAN) 0.005 % ophthalmic solution Place 1 drop into both eyes at bedtime. 07/21/18   Gildardo Pounds, NP  losartan (COZAAR) 100 MG tablet Take 1 tablet (100 mg total) by mouth daily. 09/25/17   Gildardo Pounds, NP  omeprazole (PRILOSEC) 20 MG capsule Take 1 capsule (20 mg total) by mouth daily. 01/01/18 04/07/18  Gildardo Pounds, NP  tamsulosin (FLOMAX) 0.4 MG CAPS capsule Take 1 capsule (0.4 mg total) by mouth daily. Patient not taking: Reported on 07/21/2018 09/25/17   Gildardo Pounds, NP  timolol (TIMOPTIC) 0.5 % ophthalmic solution Place 1 drop into the right eye 2 (two) times daily. 07/21/18   Gildardo Pounds, NP    Family History Family History  Problem Relation Age of Onset  . Diabetes Mother   . Asthma Father   . Asthma Sister     Social History Social History   Tobacco Use  . Smoking status: Former Smoker    Types: Cigarettes    Last attempt to quit: 09/21/1968    Years since quitting: 49.9  . Smokeless tobacco: Never Used  . Tobacco comment: "quit smoking cigarettes in 1970"  Substance Use Topics  . Alcohol use: No  . Drug use: No     Allergies   Patient has no known allergies.   Review of Systems Review of Systems  Constitutional: Negative for chills and fever.  HENT: Negative for congestion, rhinorrhea, sinus pain and sore throat.   Eyes: Negative for visual disturbance.  Respiratory: Negative for cough, chest tightness and shortness of breath.   Cardiovascular: Positive for chest pain. Negative for palpitations and leg swelling.  Gastrointestinal: Negative for abdominal pain, nausea and vomiting.  Genitourinary: Negative for dysuria and flank pain.  Musculoskeletal: Negative for back pain and myalgias.  Skin: Negative for rash.  Neurological: Negative for dizziness, syncope, light-headedness and headaches.      Physical Exam Updated Vital Signs BP (!) 153/76 (BP Location: Right Arm)   Pulse 78   Temp 98.5 F (36.9 C) (Oral)   Resp 16   Ht 5\' 10"  (1.778 m)   Wt 83.5 kg   SpO2 100%   BMI 26.40 kg/m   Physical Exam Vitals signs and nursing note reviewed.  Constitutional:      General: He is not in acute distress.    Appearance: He is well-developed.  HENT:     Head: Normocephalic and atraumatic.  Eyes:     Conjunctiva/sclera: Conjunctivae normal.     Pupils: Pupils are equal, round, and reactive to light.  Neck:     Musculoskeletal: Normal range of motion and neck supple.  Cardiovascular:     Rate and Rhythm: Normal rate and regular rhythm.     Pulses:          Radial pulses are 2+ on the right side and 2+ on the left side.       Dorsalis  pedis pulses are 2+ on the right side and 2+ on the left side.     Heart sounds: S1 normal and S2 normal. No murmur.     Comments: No calf TTP or LE edema.  Pulmonary:     Effort: Pulmonary effort is normal.     Breath sounds: Examination of the right-lower field reveals wheezing. Examination of the left-lower field reveals wheezing. Wheezing present. No rales.     Comments: Soft, end expiratory wheezes in bilateral lower lung fields.  Abdominal:     General: There is no distension.     Palpations: Abdomen is soft.     Tenderness: There is no abdominal tenderness. There is no guarding.  Musculoskeletal: Normal range of motion.        General: No deformity.  Lymphadenopathy:     Cervical: No cervical adenopathy.  Skin:    General: Skin is warm and dry.     Findings: No erythema or rash.  Neurological:     Mental Status: He is alert.     Comments: Cranial nerves grossly intact. Patient moves extremities symmetrically and with good coordination.  Psychiatric:        Behavior: Behavior normal.        Thought Content: Thought content normal.        Judgment: Judgment normal.      ED Treatments / Results  Labs (all labs ordered are  listed, but only abnormal results are displayed) Labs Reviewed  COMPREHENSIVE METABOLIC PANEL  TROPONIN I  TROPONIN I  CBC WITH DIFFERENTIAL/PLATELET    EKG EKG Interpretation  Date/Time:  Wednesday August 20 2018 07:47:43 EDT Ventricular Rate:  78 PR Interval:    QRS Duration: 99 QT Interval:  382 QTC Calculation: 436 R Axis:   -50 Text Interpretation:  Sinus rhythm Probable left atrial enlargement Left anterior fascicular block Baseline wander in lead(s) V6 Confirmed by Davonna Belling [phone]) on 08/20/2018 8:24:50 AM   Radiology Dg Chest Portable 1 View  Result Date: 08/20/2018 CLINICAL DATA:  Chest pain EXAM: PORTABLE CHEST 1 VIEW COMPARISON:  02/02/2018 FINDINGS: Lungs are clear.  No pleural effusion or pneumothorax. The heart is normal in size. IMPRESSION: No evidence of acute cardiopulmonary disease. Electronically Signed   By: Julian Hy M.D.   On: 08/20/2018 08:08    Procedures Procedures (including critical care time)  Medications Ordered in ED Medications  nitroGLYCERIN (NITROSTAT) SL tablet 0.4 mg (has no administration in time range)  aspirin chewable tablet 162 mg (162 mg Oral Given 08/20/18 0818)     Initial Impression / Assessment and Plan / ED Course  I have reviewed the triage vital signs and the nursing notes.  Pertinent labs & imaging results that were available during my care of the patient were reviewed by me and considered in my medical decision making (see chart for details).  Clinical Course as of Aug 19 1056  Wed Aug 20, 2018  0850 Pt had improvement in CP after nitro but systolic BP dropped to 295. Will hold further doses of nitro. Discussed with RN.    [AM]  [id number] Troponin I: <0.03 [AM]  1884 Spoke with Dr. Reesa Chew of Internal Medicine who will admit patient for CP rule out. Appreciate her involvement in the care of this patient.    [AM]    Clinical Course User Index [AM] Albesa Seen, PA-C       Differential diagnosis includes  ACS, PE, thoracic aortic dissection, Boerhaave's syndrome, cardiac tamponade, pneumothorax, incarcerated  diaphragmatic hernia, cholecystitis, esophageal spasm, gastroesophageal reflux, herpes zoster of the thorax, pericarditis, pneumonia, chest wall pain, costochondritis.   ACS high on differential given patient age, history of hypertension and heart score of 28 (age, risk factors, left anterior fascicular block on EKG).  Doubt PE as patient is having no active shortness of breath, lacks other major risk factors other than age, and patient not tachycardic. Doubt TAD by hx, CXR showed no widening mediastinum, and pulses equal in all extremities. Patient remained nontoxic appearing and in no acute distress during emergency department course, and had improvement in pain with nitroglycerin. Vital signs stable in the emergency department. Therefore, doubt esophageal rupture, cardiac tamponade, or pneumothorax. \Pericarditis less likely due to no preceding infectious symptoms and pain not improved in upright positions. Abnormal labs include slight stable anemia. Due to risk of MACE, admission is advised at this time.    Patient and family understand and are in agreement with plan of care.  Patient to be admitted per internal medicine teaching service.  Appreciate their involvement.  This is a shared visit with Dr. Davonna Belling. Patient was independently evaluated by this attending physician. Attending physician consulted in evaluation and management.  Final Clinical Impressions(s) / ED Diagnoses   Final diagnoses:  Precordial pain    ED Discharge Orders    None       Tamala Julian 08/20/18 1059    Davonna Belling, MD 08/20/18 1524

## 2018-08-20 NOTE — ED Notes (Signed)
Per RN [phone] lab will be drawn from IV line, will notify lab if unsuccessful. [employer]

## 2018-08-20 NOTE — Progress Notes (Addendum)
Pt stated chest pain 7/10. Christy, RN performed EKG and administered nitro. Christy, RN also administered GI cocktail and pt stated it helped his pain.

## 2018-08-21 ENCOUNTER — Observation Stay (HOSPITAL_COMMUNITY): Payer: Medicare Other

## 2018-08-21 DIAGNOSIS — R079 Chest pain, unspecified: Secondary | ICD-10-CM | POA: Diagnosis not present

## 2018-08-21 DIAGNOSIS — I2510 Atherosclerotic heart disease of native coronary artery without angina pectoris: Secondary | ICD-10-CM | POA: Diagnosis not present

## 2018-08-21 DIAGNOSIS — J45909 Unspecified asthma, uncomplicated: Secondary | ICD-10-CM | POA: Diagnosis not present

## 2018-08-21 DIAGNOSIS — I10 Essential (primary) hypertension: Secondary | ICD-10-CM | POA: Diagnosis not present

## 2018-08-21 DIAGNOSIS — Z79899 Other long term (current) drug therapy: Secondary | ICD-10-CM | POA: Diagnosis not present

## 2018-08-21 DIAGNOSIS — Z7982 Long term (current) use of aspirin: Secondary | ICD-10-CM

## 2018-08-21 DIAGNOSIS — K219 Gastro-esophageal reflux disease without esophagitis: Secondary | ICD-10-CM

## 2018-08-21 DIAGNOSIS — R0789 Other chest pain: Secondary | ICD-10-CM | POA: Diagnosis not present

## 2018-08-21 DIAGNOSIS — R072 Precordial pain: Secondary | ICD-10-CM

## 2018-08-21 MED ORDER — ASPIRIN 81 MG PO TBEC: 81.0000 mg | DELAYED_RELEASE_TABLET | Freq: Every day | ORAL | 0 refills | Status: DC

## 2018-08-21 MED ORDER — IOHEXOL 350 MG/ML SOLN
80.0000 mL | Freq: Once | INTRAVENOUS | Status: AC | PRN
  Administered 2018-08-21: 80 mL via INTRAVENOUS

## 2018-08-21 MED ORDER — METOPROLOL TARTRATE 5 MG/5ML IV SOLN
INTRAVENOUS | Status: AC
  Administered 2018-08-21: 11:00:00
  Filled 2018-08-21: qty 5

## 2018-08-21 MED ORDER — NITROGLYCERIN 0.4 MG SL SUBL
SUBLINGUAL_TABLET | SUBLINGUAL | Status: AC
  Administered 2018-08-21: 11:00:00
  Filled 2018-08-21: qty 2

## 2018-08-21 MED ORDER — NITROGLYCERIN 0.4 MG SL SUBL
0.8000 mg | SUBLINGUAL_TABLET | Freq: Once | SUBLINGUAL | Status: AC
  Administered 2018-08-21: 0.8 mg via SUBLINGUAL

## 2018-08-21 MED ORDER — METOPROLOL TARTRATE 5 MG/5ML IV SOLN
INTRAVENOUS | Status: AC
  Filled 2018-08-21: qty 10

## 2018-08-21 MED ORDER — ATORVASTATIN CALCIUM 40 MG PO TABS: 40.0000 mg | ORAL_TABLET | Freq: Every day | ORAL | 11 refills | Status: DC

## 2018-08-21 NOTE — Progress Notes (Signed)
Pt complain of shortness of breath but no pain. BP 104/60, HR 72, O2 sat.100% on room air. Dorrell, MD notified of shortness of breath. Will continue to monitor.

## 2018-08-21 NOTE — Progress Notes (Signed)
   Subjective: Mr. Merrow was unable to get coronary CTA last night because his HR was persistently >70 despite metoprolol 25mg  PO x2 and metoprolol 5mg  IV x3. This morning he is chest pain free. No pain with deep breathing. Explained that so far his work-up has looked reassuring and that we will attempt to get the CTA again today.     Objective:  Vital signs in last 24 hours: Vitals:   08/21/18 0026 08/21/18 0045 08/21/18 0108 08/21/18 0415  BP: 139/84 (!) 144/78 (!) 141/76 124/65  Pulse: 82 76 79 79  Resp:      Temp:    97.8 F (36.6 C)  TempSrc:      SpO2:    97%  Weight:    81.6 kg  Height:       General: awake, alert, lying in bed in NAD CV: RRR; no murmurs, rubs or gallops Pulm: chest wall non-tender to palpation. Effort normal on room air. Lungs CTAB Abd: abdomen is soft, non-tender, non-distended  Ext: no edema  Assessment/Plan:  Active Problems:   Chest pain  Mr. Mancinas is a 73 yo man with a medical history of HTN, asthma, prediabetes, BPH, and glaucoma of the right eye who presented to the ED with acute onset of substernal chest pain that was partially relieved by nitroglycerin.   Chest Pain - Troponins negative x3. EKG without ischemic changes x2. Plan - Consult cardiology, appreciate recs in facilitating coronary CTA - Tele - Continue aspirin 81mg  daily - Nitroglycerin prn for chest pain - Hold home amlodipine 5mg  and losartan 100mg  while using nitroglycerin  - Patient may be appropriate for discharge today if his coronary CTA is low risk  Asthma: Home albuterol inhaler prn BPH: Continue home finasteride and tamsulosin Glaucoma: continue home eye drops  Dispo: Anticipated discharge in approximately 0-1 days.  Xavia Kniskern, Andree Elk, MD 08/21/2018, 6:54 AM Pager: [phone]

## 2018-08-21 NOTE — Discharge Summary (Signed)
Name: Bryan Sullivan MRN: [id number] DOB: December 12, 1945 73 y.o. PCP: Gildardo Pounds, NP  Date of Admission: 08/20/2018  7:36 AM Date of Discharge: 08/21/2018 Attending Physician: Dr. Dareen Piano  Discharge Diagnosis: 1. Chest pain  Discharge Medications: Allergies as of 08/21/2018   No Known Allergies     Medication List    TAKE these medications   albuterol 108 (90 Base) MCG/ACT inhaler Commonly known as:  PROVENTIL HFA;VENTOLIN HFA Inhale 1-2 puffs into the lungs every 6 (six) hours as needed for wheezing or shortness of breath. What changed:  Another medication with the same name was changed. Make sure you understand how and when to take each.   albuterol (2.5 MG/3ML) 0.083% nebulizer solution Commonly known as:  PROVENTIL USE 3 MLS (2.5 MG TOTAL) BY NEBULIZATION EVERY 6 (SIX) HOURS AS NEEDED FOR WHEEZING OR SHORTNESS OF BREATH. What changed:    how much to take  how to take this  when to take this  reasons to take this  additional instructions   amLODipine 5 MG tablet Commonly known as:  NORVASC Take 1 tablet (5 mg total) by mouth daily.   aspirin 81 MG EC tablet Take 1 tablet (81 mg total) by mouth daily. Start taking on:  August 22, 2018   atorvastatin 40 MG tablet Commonly known as:  Lipitor Take 1 tablet (40 mg total) by mouth daily.   dorzolamide 2 % ophthalmic solution Commonly known as:  TRUSOPT Place 1 drop into the right eye 3 (three) times daily.   finasteride 5 MG tablet Commonly known as:  PROSCAR Take 1 tablet (5 mg total) by mouth daily.   fluorometholone 0.1 % ophthalmic suspension Commonly known as:  FML Place 1 drop into both eyes daily.   fluticasone 50 MCG/ACT nasal spray Commonly known as:  FLONASE Place 2 sprays into both nostrils daily.   Fluticasone-Salmeterol 250-50 MCG/DOSE Aepb Commonly known as:  ADVAIR Inhale 1 puff into the lungs 2 (two) times daily.   latanoprost 0.005 % ophthalmic solution Commonly known as:   XALATAN Place 1 drop into both eyes at bedtime.   losartan 100 MG tablet Commonly known as:  COZAAR Take 1 tablet (100 mg total) by mouth daily.   multivitamin tablet Take 1 tablet by mouth daily.   omeprazole 20 MG capsule Commonly known as:  PRILOSEC Take 1 capsule (20 mg total) by mouth daily.   tamsulosin 0.4 MG Caps capsule Commonly known as:  FLOMAX Take 1 capsule (0.4 mg total) by mouth daily.   timolol 0.5 % ophthalmic solution Commonly known as:  TIMOPTIC Place 1 drop into the right eye 2 (two) times daily.       Disposition and follow-up:   [name] was discharged from [facility] in Good condition.  At the hospital follow up visit please address:  1.  Chest pain - Started on daily aspirin and statin for mild non-obstructive CAD - Please ensure patient has appropriate outpatient f/u with cardiology if needed  2.  Labs / imaging needed at time of follow-up: none  3.  Pending labs/ test needing follow-up: none  Follow-up Appointments:   Hospital Course by problem list: 1. Chest pain: [name] is a 73 yo man with a medical history of HTN, asthma, prediabetes, BPH, and glaucoma of the right eye who presented to the ED with acute onset of substernal chest pain that was partially relieved by nitroglycerin. Troponins negative x3. EKG without ischemic changes x2. Coronary CTA showed  a coronary calcium score of 57 and mild non-obstructive CAD. He was started on aspirin 81mg  daily and lipitor 40mg  daily. He will f/u with his PCP for further management. Patient is already on a PPI for GERD.  Discharge Vitals:   BP (!) 114/59 (BP Location: Left Arm)   Pulse 72   Temp 98.5 F (36.9 C) (Oral)   Resp 18   Ht 5\' 10"  (1.778 m)   Wt 81.6 kg   SpO2 99%   BMI 25.80 kg/m   Pertinent Labs, Studies, and Procedures:  CBC Latest Ref Rng & Units 08/20/2018 02/02/2018 01/05/2018  WBC 4.0 - 10.5 K/uL 7.5 4.2 12.3(H)  Hemoglobin 13.0 - 17.0 g/dL  11.9(L) 11.8(L) 12.6(L)  Hematocrit 39.0 - 52.0 % 36.8(L) 37.9(L) 39.4  Platelets 150 - 400 K/uL 231 200 295   CMP Latest Ref Rng & Units 08/20/2018 04/18/2018 02/02/2018  Glucose 70 - 99 mg/dL 113(H) 104(H) 128(H)  BUN 8 - 23 mg/dL 17 14 12   Creatinine 0.61 - 1.24 mg/dL 1.13 1.04 1.02  Sodium 135 - 145 mmol/L 138 140 140  Potassium 3.5 - 5.1 mmol/L 4.3 4.5 3.8  Chloride 98 - 111 mmol/L 104 103 105  CO2 22 - 32 mmol/L 26 23 28   Calcium 8.9 - 10.3 mg/dL 9.1 9.0 8.7(L)  Total Protein 6.5 - 8.1 g/dL 6.4(L) 6.8 -  Total Bilirubin 0.3 - 1.2 mg/dL 0.8 0.2 -  Alkaline Phos 38 - 126 U/L 58 77 -  AST 15 - 41 U/L 19 16 -  ALT 0 - 44 U/L 18 13 -   CTA Coronary 1. Coronary calcium score of 57. This was 25 percentile for age and sex matched control. 2. Normal coronary origin with right dominance. 3. Mild non-obstructive CAD. Risk factor modification is recommended.  Discharge Instructions: Discharge Instructions    Discharge instructions   Complete by:  As directed    It was a pleasure taking care of you during your hospitalization, [name]!  You were hospitalized for chest pain. You were found to have mild coronary artery disease. For this you should start taking a daily aspirin 81mg  and lipitor 40mg  daily.   You should follow-up with your PCP in the next 1-2 weeks for further management.  Thanks, Dr. Annie Sullivan      Signed: Dorrell, Andree Elk, MD 08/21/2018, 5:21 PM   Pager: [phone]

## 2018-08-22 MED FILL — ATORVASTATIN CALCIUM 40 MG: 40 | 30 days supply | Qty: 30 | Fill #0

## 2018-08-30 ENCOUNTER — Emergency Department (HOSPITAL_COMMUNITY): Payer: Medicare Other

## 2018-08-30 ENCOUNTER — Encounter (HOSPITAL_COMMUNITY): Payer: Self-pay | Admitting: Emergency Medicine

## 2018-08-30 ENCOUNTER — Other Ambulatory Visit: Payer: Self-pay

## 2018-08-30 ENCOUNTER — Inpatient Hospital Stay (HOSPITAL_COMMUNITY)
Admission: EM | Admit: 2018-08-30 | Discharge: 2018-09-02 | DRG: 192 | Disposition: A | Discharge status: Home / Self Care | Payer: Medicare Other | Attending: Internal Medicine | Admitting: Internal Medicine

## 2018-08-30 DIAGNOSIS — Z825 Family history of asthma and other chronic lower respiratory diseases: Secondary | ICD-10-CM | POA: Diagnosis not present

## 2018-08-30 DIAGNOSIS — I10 Essential (primary) hypertension: Secondary | ICD-10-CM | POA: Diagnosis not present

## 2018-08-30 DIAGNOSIS — H409 Unspecified glaucoma: Secondary | ICD-10-CM | POA: Diagnosis not present

## 2018-08-30 DIAGNOSIS — K219 Gastro-esophageal reflux disease without esophagitis: Secondary | ICD-10-CM | POA: Diagnosis present

## 2018-08-30 DIAGNOSIS — Z947 Corneal transplant status: Secondary | ICD-10-CM

## 2018-08-30 DIAGNOSIS — Z7951 Long term (current) use of inhaled steroids: Secondary | ICD-10-CM | POA: Diagnosis not present

## 2018-08-30 DIAGNOSIS — Z20828 Contact with and (suspected) exposure to other viral communicable diseases: Secondary | ICD-10-CM | POA: Diagnosis present

## 2018-08-30 DIAGNOSIS — N400 Enlarged prostate without lower urinary tract symptoms: Secondary | ICD-10-CM | POA: Diagnosis present

## 2018-08-30 DIAGNOSIS — Z833 Family history of diabetes mellitus: Secondary | ICD-10-CM | POA: Diagnosis not present

## 2018-08-30 DIAGNOSIS — I2510 Atherosclerotic heart disease of native coronary artery without angina pectoris: Secondary | ICD-10-CM | POA: Diagnosis not present

## 2018-08-30 DIAGNOSIS — Z7982 Long term (current) use of aspirin: Secondary | ICD-10-CM

## 2018-08-30 DIAGNOSIS — J069 Acute upper respiratory infection, unspecified: Secondary | ICD-10-CM

## 2018-08-30 DIAGNOSIS — I444 Left anterior fascicular block: Secondary | ICD-10-CM | POA: Diagnosis not present

## 2018-08-30 DIAGNOSIS — J440 Chronic obstructive pulmonary disease with acute lower respiratory infection: Secondary | ICD-10-CM | POA: Diagnosis present

## 2018-08-30 DIAGNOSIS — J441 Chronic obstructive pulmonary disease with (acute) exacerbation: Principal | ICD-10-CM | POA: Diagnosis present

## 2018-08-30 DIAGNOSIS — Z79899 Other long term (current) drug therapy: Secondary | ICD-10-CM

## 2018-08-30 DIAGNOSIS — R0602 Shortness of breath: Secondary | ICD-10-CM | POA: Diagnosis not present

## 2018-08-30 DIAGNOSIS — R6889 Other general symptoms and signs: Secondary | ICD-10-CM

## 2018-08-30 DIAGNOSIS — Z87891 Personal history of nicotine dependence: Secondary | ICD-10-CM

## 2018-08-30 DIAGNOSIS — J189 Pneumonia, unspecified organism: Secondary | ICD-10-CM

## 2018-08-30 DIAGNOSIS — J4489 Other specified chronic obstructive pulmonary disease: Secondary | ICD-10-CM | POA: Diagnosis present

## 2018-08-30 DIAGNOSIS — Z20822 Contact with and (suspected) exposure to covid-19: Secondary | ICD-10-CM | POA: Diagnosis present

## 2018-08-30 DIAGNOSIS — J449 Chronic obstructive pulmonary disease, unspecified: Secondary | ICD-10-CM | POA: Diagnosis present

## 2018-08-30 DIAGNOSIS — R7303 Prediabetes: Secondary | ICD-10-CM

## 2018-08-30 DIAGNOSIS — Z9114 Patient's other noncompliance with medication regimen: Secondary | ICD-10-CM

## 2018-08-30 LAB — HEPATIC FUNCTION PANEL
ALT: 13 U/L (ref 0–44)
AST: 14 U/L — ABNORMAL LOW (ref 15–41)
Albumin: 3.4 g/dL — ABNORMAL LOW (ref 3.5–5.0)
Alkaline Phosphatase: 70 U/L (ref 38–126)
Bilirubin, Direct: <0.1 mg/dL (ref 0.0–0.2)
Total Bilirubin: 0.5 mg/dL (ref 0.3–1.2)
Total Protein: 6.7 g/dL (ref 6.5–8.1)

## 2018-08-30 LAB — URINALYSIS, ROUTINE W REFLEX MICROSCOPIC
Bilirubin Urine: NEGATIVE
Glucose, UA: NEGATIVE mg/dL
Hgb urine dipstick: NEGATIVE
Ketones, ur: NEGATIVE mg/dL
Leukocytes,Ua: NEGATIVE
Nitrite: NEGATIVE
Protein, ur: NEGATIVE mg/dL
Specific Gravity, Urine: 1.011 (ref 1.005–1.030)
pH: 6 (ref 5.0–8.0)

## 2018-08-30 LAB — CBC
HCT: 37.5 % — ABNORMAL LOW (ref 39.0–52.0)
Hemoglobin: 12 g/dL — ABNORMAL LOW (ref 13.0–17.0)
MCH: 27.1 pg (ref 26.0–34.0)
MCHC: 32 g/dL (ref 30.0–36.0)
MCV: 84.7 fL (ref 80.0–100.0)
Platelets: 284 K/uL (ref 150–400)
RBC: 4.43 MIL/uL (ref 4.22–5.81)
RDW: 12.5 % (ref 11.5–15.5)
WBC: 10.3 K/uL (ref 4.0–10.5)
nRBC: 0 % (ref 0.0–0.2)

## 2018-08-30 LAB — DIFFERENTIAL
Basophils Absolute: 0 10*3/uL (ref 0.0–0.1)
Basophils Relative: 0 %
Eosinophils Absolute: 0.1 10*3/uL (ref 0.0–0.5)
Eosinophils Relative: 1 %
Lymphocytes Relative: 11 %
Lymphs Abs: 1.1 10*3/uL (ref 0.7–4.0)
Monocytes Absolute: 0.4 10*3/uL (ref 0.1–1.0)
Monocytes Relative: 4 %
Neutro Abs: 8.5 10*3/uL — ABNORMAL HIGH (ref 1.7–7.7)
Neutrophils Relative %: 85 %

## 2018-08-30 LAB — TROPONIN I: Troponin I: <0.03 ng/mL (ref <0.03)

## 2018-08-30 LAB — BASIC METABOLIC PANEL WITH GFR
Anion gap: 7 (ref 5–15)
BUN: 12 mg/dL (ref 8–23)
CO2: 25 mmol/L (ref 22–32)
Calcium: 8.6 mg/dL — ABNORMAL LOW (ref 8.9–10.3)
Chloride: 104 mmol/L (ref 98–111)
Creatinine, Ser: 1.16 mg/dL (ref 0.61–1.24)
GFR calc Af Amer: >60 mL/min (ref >60)
GFR calc non Af Amer: >60 mL/min (ref >60)
Glucose, Bld: 154 mg/dL — ABNORMAL HIGH (ref 70–99)
Potassium: 4 mmol/L (ref 3.5–5.1)
Sodium: 136 mmol/L (ref 135–145)

## 2018-08-30 LAB — RESPIRATORY PANEL BY PCR

## 2018-08-30 LAB — LACTATE DEHYDROGENASE: LDH: 96 U/L — ABNORMAL LOW (ref 98–192)

## 2018-08-30 LAB — LACTIC ACID, PLASMA: Lactic Acid, Venous: 1.4 mmol/L (ref 0.5–1.9)

## 2018-08-30 LAB — PROCALCITONIN: Procalcitonin: <0.1 ng/mL

## 2018-08-30 LAB — C-REACTIVE PROTEIN: CRP: 3 mg/dL — ABNORMAL HIGH (ref <1.0)

## 2018-08-30 MED ORDER — FLUOROMETHOLONE 0.1 % OP SUSP
1.0000 [drp] | Freq: Every day | OPHTHALMIC | Status: DC
  Administered 2018-08-30 – 2018-09-02 (×3): 1 [drp] via OPHTHALMIC
  Filled 2018-08-30: qty 5

## 2018-08-30 MED ORDER — IPRATROPIUM-ALBUTEROL 20-100 MCG/ACT IN AERS
2.0000 | INHALATION_SPRAY | Freq: Four times a day (QID) | RESPIRATORY_TRACT | Status: DC
  Administered 2018-08-30 – 2018-09-01 (×9): 2 via RESPIRATORY_TRACT
  Filled 2018-08-30 (×2): qty 4

## 2018-08-30 MED ORDER — ACETAMINOPHEN 325 MG PO TABS
650.0000 mg | ORAL_TABLET | Freq: Once | ORAL | Status: AC
  Administered 2018-08-30: 03:00:00 650 mg via ORAL
  Filled 2018-08-30: qty 2

## 2018-08-30 MED ORDER — PREDNISONE 20 MG PO TABS
40.0000 mg | ORAL_TABLET | Freq: Every day | ORAL | Status: DC
  Administered 2018-08-30 – 2018-08-31 (×2): 40 mg via ORAL
  Filled 2018-08-30 (×2): qty 2

## 2018-08-30 MED ORDER — AZITHROMYCIN 500 MG PO TABS
250.0000 mg | ORAL_TABLET | Freq: Every day | ORAL | Status: DC
  Administered 2018-08-31 – 2018-09-02 (×3): 250 mg via ORAL
  Filled 2018-08-30 (×3): qty 1

## 2018-08-30 MED ORDER — MOMETASONE FURO-FORMOTEROL FUM 100-5 MCG/ACT IN AERO: 2.0000 | INHALATION_SPRAY | Freq: Two times a day (BID) | RESPIRATORY_TRACT | Status: DC

## 2018-08-30 MED ORDER — FINASTERIDE 5 MG PO TABS
5.0000 mg | ORAL_TABLET | Freq: Every day | ORAL | Status: DC
  Administered 2018-08-30 – 2018-09-02 (×4): 5 mg via ORAL
  Filled 2018-08-30 (×4): qty 1

## 2018-08-30 MED ORDER — ENOXAPARIN SODIUM 40 MG/0.4ML ~~LOC~~ SOLN
40.0000 mg | SUBCUTANEOUS | Status: DC
  Administered 2018-08-30 – 2018-09-02 (×4): 40 mg via SUBCUTANEOUS
  Filled 2018-08-30 (×4): qty 0.4

## 2018-08-30 MED ORDER — SODIUM CHLORIDE 0.9 % IV SOLN
2.0000 g | Freq: Once | INTRAVENOUS | Status: AC
  Administered 2018-08-30: 2 g via INTRAVENOUS
  Filled 2018-08-30: qty 2

## 2018-08-30 MED ORDER — TAMSULOSIN HCL 0.4 MG PO CAPS
0.4000 mg | ORAL_CAPSULE | Freq: Every day | ORAL | Status: DC
  Administered 2018-08-30 – 2018-09-02 (×4): 0.4 mg via ORAL
  Filled 2018-08-30 (×4): qty 1

## 2018-08-30 MED ORDER — SENNOSIDES-DOCUSATE SODIUM 8.6-50 MG PO TABS: 1.0000 | ORAL_TABLET | Freq: Every evening | ORAL | Status: DC | PRN

## 2018-08-30 MED ORDER — SODIUM CHLORIDE 0.9% FLUSH
3.0000 mL | Freq: Once | INTRAVENOUS | Status: AC
  Administered 2018-08-30: 3 mL via INTRAVENOUS

## 2018-08-30 MED ORDER — ADULT MULTIVITAMIN W/MINERALS CH
1.0000 | ORAL_TABLET | Freq: Every day | ORAL | Status: DC
  Administered 2018-08-30 – 2018-09-02 (×4): 1 via ORAL
  Filled 2018-08-30 (×4): qty 1

## 2018-08-30 MED ORDER — ACETAMINOPHEN 650 MG RE SUPP: 650.0000 mg | Freq: Four times a day (QID) | RECTAL | Status: DC | PRN

## 2018-08-30 MED ORDER — PANTOPRAZOLE SODIUM 40 MG PO TBEC
40.0000 mg | DELAYED_RELEASE_TABLET | Freq: Every day | ORAL | Status: DC
  Administered 2018-08-30 – 2018-09-02 (×4): 40 mg via ORAL
  Filled 2018-08-30 (×4): qty 1

## 2018-08-30 MED ORDER — MOMETASONE FURO-FORMOTEROL FUM 200-5 MCG/ACT IN AERO
2.0000 | INHALATION_SPRAY | Freq: Two times a day (BID) | RESPIRATORY_TRACT | Status: DC
  Administered 2018-08-30 – 2018-09-02 (×7): 2 via RESPIRATORY_TRACT
  Filled 2018-08-30: qty 8.8

## 2018-08-30 MED ORDER — ATORVASTATIN CALCIUM 40 MG PO TABS
40.0000 mg | ORAL_TABLET | Freq: Every day | ORAL | Status: DC
  Administered 2018-08-30 – 2018-09-01 (×3): 40 mg via ORAL
  Filled 2018-08-30 (×3): qty 1

## 2018-08-30 MED ORDER — ACETAMINOPHEN 325 MG PO TABS
650.0000 mg | ORAL_TABLET | Freq: Four times a day (QID) | ORAL | Status: DC | PRN
  Administered 2018-08-30 – 2018-08-31 (×2): 650 mg via ORAL
  Filled 2018-08-30 (×3): qty 2

## 2018-08-30 MED ORDER — DORZOLAMIDE HCL 2 % OP SOLN
1.0000 [drp] | Freq: Three times a day (TID) | OPHTHALMIC | Status: DC
  Administered 2018-08-30 – 2018-09-01 (×6): 1 [drp] via OPHTHALMIC
  Filled 2018-08-30: qty 10

## 2018-08-30 MED ORDER — SODIUM CHLORIDE 0.9 % IV SOLN: 500.0000 mg | Freq: Once | INTRAVENOUS | Status: DC

## 2018-08-30 MED ORDER — AZITHROMYCIN 250 MG PO TABS
500.0000 mg | ORAL_TABLET | Freq: Once | ORAL | Status: AC
  Administered 2018-08-30: 500 mg via ORAL
  Filled 2018-08-30: qty 2

## 2018-08-30 MED ORDER — ASPIRIN EC 81 MG PO TBEC
81.0000 mg | DELAYED_RELEASE_TABLET | Freq: Every day | ORAL | Status: DC
  Administered 2018-08-30 – 2018-09-02 (×4): 81 mg via ORAL
  Filled 2018-08-30 (×4): qty 1

## 2018-08-30 MED ORDER — TIMOLOL MALEATE 0.5 % OP SOLN
1.0000 [drp] | Freq: Two times a day (BID) | OPHTHALMIC | Status: DC
  Administered 2018-08-30 – 2018-09-01 (×5): 1 [drp] via OPHTHALMIC
  Filled 2018-08-30: qty 5

## 2018-08-30 MED ORDER — LATANOPROST 0.005 % OP SOLN
1.0000 [drp] | Freq: Every day | OPHTHALMIC | Status: DC
  Administered 2018-08-30 – 2018-09-01 (×3): 1 [drp] via OPHTHALMIC
  Filled 2018-08-30: qty 2.5

## 2018-08-30 NOTE — H&P (Addendum)
Date: 08/30/2018               Patient Name:  Bryan Sullivan MRN: [id number]  DOB: [birth year] Age / Sex: 73 y.o., male   PCP: Bryan Pounds, NP         Medical Service: Internal Medicine Teaching Service         Attending Physician: Dr. Sid Falcon, MD    First Contact: Dr. Alfonse Spruce Pager: [phone]  Second Contact: Dr. Maricela Bo Pager: [phone]       After Hours (After 5p/  First Contact Pager: [phone]  weekends / holidays): Second Contact Pager: [phone]   Chief Complaint: fever, cough, and dyspnea  History of Present Illness: Bryan Sullivan is a 73 yo man with a medical history of HTN, CAD, COPD with asthmatic component (no PFTs on record), prediabetes, BPH, and glaucoma of the right eye who presented to the ED with two days of productive cough (brown sputum, no hemoptysis), dyspnea, wheezing, and chest tightness. He also endorses fevers that started last night. He was recently hospitalized from 3/25-3/26 for chest pain rule out. CTA coronary showed mild non-obstructive CAD and he was started on aspirin and statin before discharging home. The patient reports he felt well and was chest pain free after leaving the hospital. His new symptoms started two days ago and he reports they feel like a COPD exacerbation. He has been using an albuterol inhaler and albuterol nebulizers at home, which improve his breathing. He has not been taking his Advair. He endorses sinus congestion and rhinorrhea. He denies headache, abdominal pain, n/v/d, urinary symptoms. He felt better after receiving albuterol with EMS, but he reports that his chest tightness and dyspnea are worsening again and is requesting another breathing treatment. He is a former smoker, quit in 1977.   The patient lives with his wife, son, daughter-in-law, and four grandchildren. None of them are sick. He denies other sick contacts. He has been at home since his hospitalization.       The patient received solumedrol and multiple puffs  of albuterol inhaler with EMS during transport. Upon arrival to the ED, the patient was febrile to 102.5, tachycardic to 122, satting well on room air, and hypertensive to 151/71. His HR and BP may have been elevated 2/2 albuterol as they normalized spontaneously in the ED. Labs significant for no leukocytosis, Hb 12 (at baseline), negative troponin, negative lactate, CRP 3, LDH 96. UA unremarkable. EKG showed sinus tachycardia and no ischemic changes compared to prior. CXR without edema or opacities. He received tylenol, cefepime, and azithromycin in the ED.    Meds:  No current facility-administered medications on file prior to encounter.    Current Outpatient Medications on File Prior to Encounter  Medication Sig Dispense Refill  . albuterol (PROVENTIL HFA;VENTOLIN HFA) 108 (90 Base) MCG/ACT inhaler Inhale 1-2 puffs into the lungs every 6 (six) hours as needed for wheezing or shortness of breath. 1 Inhaler 3  . albuterol (PROVENTIL) (2.5 MG/3ML) 0.083% nebulizer solution USE 3 MLS (2.5 MG TOTAL) BY NEBULIZATION EVERY 6 (SIX) HOURS AS NEEDED FOR WHEEZING OR SHORTNESS OF BREATH. (Patient taking differently: Take 2.5 mg by nebulization every 6 (six) hours as needed for wheezing or shortness of breath. Marland Kitchen) 1080 mL 1  . amLODipine (NORVASC) 5 MG tablet Take 1 tablet (5 mg total) by mouth daily. 90 tablet 3  . aspirin EC 81 MG EC tablet Take 1 tablet (81 mg total) by mouth daily. 30 tablet  0  . atorvastatin (LIPITOR) 40 MG tablet Take 1 tablet (40 mg total) by mouth daily. 30 tablet 11  . dorzolamide (TRUSOPT) 2 % ophthalmic solution Place 1 drop into the right eye 3 (three) times daily. 30 mL 3  . finasteride (PROSCAR) 5 MG tablet Take 1 tablet (5 mg total) by mouth daily. 90 tablet 1  . fluorometholone (FML) 0.1 % ophthalmic suspension Place 1 drop into both eyes daily. 15 mL 3  . fluticasone (FLONASE) 50 MCG/ACT nasal spray Place 2 sprays into both nostrils daily. (Patient not taking: Reported on  04/07/2018) 16 g 2  . Fluticasone-Salmeterol (ADVAIR) 250-50 MCG/DOSE AEPB Inhale 1 puff into the lungs 2 (two) times daily. (Patient not taking: Reported on 08/20/2018) 1 each 3  . latanoprost (XALATAN) 0.005 % ophthalmic solution Place 1 drop into both eyes at bedtime. 7.5 mL 3  . losartan (COZAAR) 100 MG tablet Take 1 tablet (100 mg total) by mouth daily. 90 tablet 3  . Multiple Vitamin (MULTIVITAMIN) tablet Take 1 tablet by mouth daily.    Marland Kitchen omeprazole (PRILOSEC) 20 MG capsule Take 1 capsule (20 mg total) by mouth daily. 90 capsule 1  . tamsulosin (FLOMAX) 0.4 MG CAPS capsule Take 1 capsule (0.4 mg total) by mouth daily. 30 capsule 11  . timolol (TIMOPTIC) 0.5 % ophthalmic solution Place 1 drop into the right eye 2 (two) times daily. 15 mL 3    Allergies: Allergies as of 08/30/2018  . (No Known Allergies)   Past Medical History:  Diagnosis Date  . Asthma    As child   . Enlarged prostate Dx 2001  . Hypertension    Dx at age 52   Past Surgical History:  Procedure Laterality Date  . CATARACT EXTRACTION W/ INTRAOCULAR LENS  IMPLANT, BILATERAL Bilateral   . CORNEAL TRANSPLANT Right   . EYE SURGERY      Family History:  Family History  Problem Relation Age of Onset  . Diabetes Mother   . Asthma Father   . Asthma Sister    Social History: Lives with his wife, son, daughter-in-law, and 4 grandchildren. Moved to the [place] from [place] years ago. Former smoker, quit in 1977. Denies etoh or illicit drug use.   Review of Systems: A complete ROS was negative except as per HPI.   Physical Exam: Blood pressure 127/80, pulse 93, temperature 99.3 F (37.4 C), temperature source Oral, resp. rate 20, SpO2 98 %.  Constitutional: Well-developed, well-nourished, and in no distress.  Eyes: Pupils are equal, round, and reactive to light. EOM are normal.  Cardiovascular: Normal rate and regular rhythm. No murmurs, rubs, or gallops. Pulmonary/Chest: Oxygenating well on room air. Effort normal.  Diffuse faint expiratory wheezes. Abdominal: Bowel sounds present. Soft, non-distended, non-tender. Ext: No lower extremity edema. Skin: Warm and dry. No rashes or wounds.  EKG: personally reviewed my interpretation is sinus tachycardia and no ischemic changes compared to prior  CXR: personally reviewed my interpretation is without edema or opacities  Assessment & Plan by Problem: Active Problems:   Suspected Covid-19 Virus Infection  Mr. Cianci is a 73 yo man with a medical history of HTN, CAD, COPD with asthmatic component (no PFTs on record), prediabetes, BPH, and glaucoma of the right eye who presented with cough, dyspnea, chest tightness, and fever to 102. He was discharged from the hospital 10 days ago after a chest pain work-up. There was initial concern for sepsis in the ED given his fever and tachycardia, but his tachycardia  resolved spontaneously. He is currently hemodynamically stable and saturating well on room air.  COPD exacerbation 2/2 URI - No signs of pneumonia on CXR. He received azithro and cefepime in the ED. Will continue azithromycin for COPD exacerbation.  - The most likely etiology is a viral URI as patient is febrile and endorses respiratory symptoms including cough and rhinorrhea. Cannot rule out Covid-19 infection. Risk factors for Covid include recent hospitalization. No known sick contacts. He has a large family at home who may have been exposed if he is positive for Covid.  Plan - Airborne and contact precautions - RVP and Covid-19 testing - F/u blood cultures and procalcitonin  - Combivent inhaler - Dulera inhaler. Readdress why patient is not taking advair prior to d/c. - Azithromycin  - Prednisone  - Tylenol prn for fever  - Tele  HTN: Currently normotensive - Hold home amlodipine 5mg  daily and losartan 100mg  daily in the setting of infection. Resume if patient becomes hypertensive.  CAD: Continue home aspirin 81mg  and atorvastatin 40mg  daily BPH:  Continue home finasteride and tamsulosin Glaucoma: continue home eye drops  FEN: no IV fluids, heart healthy diet, replace electrolytes as needed  DVT ppx: Lovenox Code status: FULL code  Dispo: Admit patient to Observation with expected length of stay less than 2 midnights.  Signed: Corinne Ports, MD 08/30/2018, 5:35 AM  Pager: [phone]

## 2018-08-30 NOTE — ED Triage Notes (Signed)
Per EMS, pt from home, began to have a cough yesterday then today developed SOB and a fever.  He was just released from the hospital last week after an MI.  Given 8 puffs on rescuer inhaler, 125 solumedrol.  Heart rate is 120, 140/90, 96% on RA.  Fever when he arrived 102.6 oral.

## 2018-08-30 NOTE — ED Notes (Signed)
ED TO INPATIENT HANDOFF REPORT  ED Nurse Name and Phone #: Kathie Rhodes RN  S Name/Age/Gender Bryan Sullivan 73 y.o. male Room/Bed: 026C/026C  Code Status   Code Status: Prior  Home/SNF/Other Home Patient oriented to: self, place, time and situation Is this baseline? Yes   Triage Complete: Triage complete  Chief Complaint CP possible COVID  Triage Note Per EMS, pt from home, began to have a cough yesterday then today developed SOB and a fever.  He was just released from the hospital last week after an MI.  Given 8 puffs on rescuer inhaler, 125 solumedrol.  Heart rate is 120, 140/90, 96% on RA.  Fever when he arrived 102.6 oral.     Allergies No Known Allergies  Level of Care/Admitting Diagnosis ED Disposition    ED Disposition Condition Comment   Admit  Hospital Area: [facility] [[id number]]  Level of Care: Telemetry Medical [104]  Diagnosis: Suspected Covid-19 Virus Infection [[id number]]  Admitting Physician: Sid Falcon [[id number]  Attending Physician: Sid Falcon [[id number]]  Bed request comments: 2W, high risk  PT Class (Do Not Modify): Observation [104]  PT Acc Code (Do Not Modify): Observation [10022]       B Medical/Surgery History Past Medical History:  Diagnosis Date  . Asthma    As child   . Enlarged prostate Dx 2001  . Hypertension    Dx at age 70   Past Surgical History:  Procedure Laterality Date  . CATARACT EXTRACTION W/ INTRAOCULAR LENS  IMPLANT, BILATERAL Bilateral   . CORNEAL TRANSPLANT Right   . EYE SURGERY       A IV Location/Drains/Wounds Patient Lines/Drains/Airways Status   Active Line/Drains/Airways    Name:   Placement date:   Placement time:   Site:   Days:   Peripheral IV 08/30/18 Right Antecubital   08/30/18    0137    Antecubital   less than 1   Peripheral IV 08/30/18 Right Wrist   08/30/18    0241    Wrist   less than 1          Intake/Output Last 24 hours No intake or output data in the 24 hours  ending 08/30/18 0542  Labs/Imaging Results for orders placed or performed during the hospital encounter of 08/30/18 (from the past 48 hour(s))  Lactate dehydrogenase     Status: Abnormal   Collection Time: 08/30/18  1:54 AM  Result Value Ref Range   LDH 96 (L) 98 - 192 U/L    Comment: Performed at [facility], [address] [place], [place] [address]  C-reactive protein     Status: Abnormal   Collection Time: 08/30/18  1:54 AM  Result Value Ref Range   CRP 3.0 (H) <1.0 mg/dL    Comment: Performed at [facility] [address] [place], [place] [address]  Hepatic function panel     Status: Abnormal   Collection Time: 08/30/18  1:54 AM  Result Value Ref Range   Total Protein 6.7 6.5 - 8.1 g/dL   Albumin 3.4 (L) 3.5 - 5.0 g/dL   AST 14 (L) 15 - 41 U/L   ALT 13 0 - 44 U/L   Alkaline Phosphatase 70 38 - 126 U/L   Total Bilirubin 0.5 0.3 - 1.2 mg/dL   Bilirubin, Direct <0.1 0.0 - 0.2 mg/dL   Indirect Bilirubin NOT CALCULATED 0.3 - 0.9 mg/dL    Comment: Performed at [facility], [address] [place], [place] [address]  Basic metabolic panel     Status: Abnormal   Collection Time: 08/30/18  1:57 AM  Result Value Ref Range   Sodium 136 135 - 145 mmol/L   Potassium 4.0 3.5 - 5.1 mmol/L   Chloride 104 98 - 111 mmol/L   CO2 25 22 - 32 mmol/L   Glucose, Bld 154 (H) 70 - 99 mg/dL   BUN 12 8 - 23 mg/dL   Creatinine, Ser 1.16 0.61 - 1.24 mg/dL   Calcium 8.6 (L) 8.9 - 10.3 mg/dL   GFR calc non Af Amer >60 >60 mL/min   GFR calc Af Amer >60 >60 mL/min   Anion gap 7 5 - 15    Comment: Performed at [facility] [address] [place], [place] [address]  CBC     Status: Abnormal   Collection Time: 08/30/18  1:57 AM  Result Value Ref Range   WBC 10.3 4.0 - 10.5 K/uL   RBC 4.43 4.22 - 5.81 MIL/uL   Hemoglobin 12.0 (L) 13.0 - 17.0 g/dL   HCT 37.5 (L) 39.0 - 52.0 %   MCV 84.7 80.0 - 100.0 fL   MCH 27.1 26.0 - 34.0 pg   MCHC 32.0 30.0 - 36.0 g/dL   RDW 12.5  11.5 - 15.5 %   Platelets 284 150 - 400 K/uL   nRBC 0.0 0.0 - 0.2 %    Comment: Performed at [facility], [place] [address] [place], [place] [address]  Troponin I - ONCE - STAT     Status: None   Collection Time: 08/30/18  1:57 AM  Result Value Ref Range   Troponin I <0.03 <0.03 ng/mL    Comment: Performed at [facility] [address], [place], [place] [address]  Differential     Status: Abnormal   Collection Time: 08/30/18  1:57 AM  Result Value Ref Range   Neutrophils Relative % 85 %   Neutro Abs 8.5 (H) 1.7 - 7.7 K/uL   Lymphocytes Relative 11 %   Lymphs Abs 1.1 0.7 - 4.0 K/uL   Monocytes Relative 4 %   Monocytes Absolute 0.4 0.1 - 1.0 K/uL   Eosinophils Relative 1 %   Eosinophils Absolute 0.1 0.0 - 0.5 K/uL   Basophils Relative 0 %   Basophils Absolute 0.0 0.0 - 0.1 K/uL    Comment: Performed at [facility] [address], [place], [place] [address]  Urinalysis, Routine w reflex microscopic     Status: None   Collection Time: 08/30/18  2:46 AM  Result Value Ref Range   Color, Urine YELLOW YELLOW   APPearance CLEAR CLEAR   Specific Gravity, Urine 1.011 1.005 - 1.030   pH 6.0 5.0 - 8.0   Glucose, UA NEGATIVE NEGATIVE mg/dL   Hgb urine dipstick NEGATIVE NEGATIVE   Bilirubin Urine NEGATIVE NEGATIVE   Ketones, ur NEGATIVE NEGATIVE mg/dL   Protein, ur NEGATIVE NEGATIVE mg/dL   Nitrite NEGATIVE NEGATIVE   Leukocytes,Ua NEGATIVE NEGATIVE    Comment: Performed at [facility] [address] [place], [place] [address]  Lactic acid, plasma     Status: None   Collection Time: 08/30/18  4:03 AM  Result Value Ref Range   Lactic Acid, Venous 1.4 0.5 - 1.9 mmol/L    Comment: Performed at [facility] [address], [place], [place] [address]   Dg Chest Portable 1 View  Result Date:  08/30/2018 CLINICAL DATA:  Shortness of breath EXAM: PORTABLE CHEST 1 VIEW COMPARISON:  None. FINDINGS: The heart size and mediastinal contours are within normal  limits. Both lungs are clear. The visualized skeletal structures are unremarkable. IMPRESSION: No active disease. Electronically Signed   By: Ulyses Jarred M.D.   On: 08/30/2018 02:42    Pending Labs Unresulted Labs (From admission, onward)    Start     Ordered   08/30/18 0526  Respiratory Panel by PCR  (Respiratory virus panel with precautions)  ONCE - STAT,   R     08/30/18 0525   08/30/18 0247  Culture, blood (Routine X 2) w Reflex to ID Panel  BLOOD CULTURE X 2,   R     08/30/18 0246   08/30/18 0235  Procalcitonin  ONCE - STAT,   STAT     08/30/18 0235   Signed and Held  Basic metabolic panel  Tomorrow morning,   R     Signed and Held   Signed and Held  CBC  Tomorrow morning,   R     Signed and Held          Vitals/Pain Today's Vitals   08/30/18 0415 08/30/18 0430 08/30/18 0430 08/30/18 0432  BP: 126/86 127/80    Pulse: 90 93    Resp: (!) 21 20    Temp:   99.3 F (37.4 C)   TempSrc:   Oral   SpO2: 97% 98%    PainSc:    0-No pain    Isolation Precautions Airborne and Contact precautions  Medications Medications  Ipratropium-Albuterol (COMBIVENT) respimat 2 puff (has no administration in time range)  predniSONE (DELTASONE) tablet 40 mg (has no administration in time range)  azithromycin (ZITHROMAX) tablet 250 mg (has no administration in time range)  sodium chloride flush (NS) 0.9 % injection 3 mL (3 mLs Intravenous Given 08/30/18 0242)  acetaminophen (TYLENOL) tablet 650 mg (650 mg Oral Given 08/30/18 0240)  ceFEPIme (MAXIPIME) 2 g in sodium chloride 0.9 % 100 mL IVPB (0 g Intravenous Stopped 08/30/18 0430)  azithromycin (ZITHROMAX) tablet 500 mg (500 mg Oral Given 08/30/18 8453)    Mobility walks with device Low fall risk   Focused Assessments Pulmonary Assessment Handoff:  Lung sounds:   O2 Device: Room Air        R Recommendations: See Admitting Provider Note  Report given to:   Additional Notes:

## 2018-08-30 NOTE — ED Provider Notes (Addendum)
[facility] Provider Note   CSN: [id number] Arrival date & time: 08/30/18  0136    History   Chief Complaint Chief Complaint  Patient presents with  . Shortness of Breath  . Fever    HPI Noam Nemetz is a 73 y.o. male.     HPI 73 year old male comes to the ER with chief complaint of shortness of breath. He has history of hypertension, COPD and was recently admitted to the hospital for anginal work-up.  Patient states that he started having a cough on Thursday.  Over time he started having more shortness of breath and last night he started having fevers, which is why decided to come to the ER.  Patient has taken nebulizer treatment and does report that his symptoms get better.  There is no associated nausea, vomiting, diarrhea, abdominal pain, UTI-like symptoms.  Patient denies any sick contacts.  In route to the ED EMS gave him 125 mg of Solu-Medrol and multiple puffs of albuterol.  Past Medical History:  Diagnosis Date  . Asthma    As child   . Enlarged prostate Dx 2001  . Hypertension    Dx at age 1    Patient Active Problem List   Diagnosis Date Noted  . Suspected Covid-19 Virus Infection 08/30/2018  . Chest pain 08/20/2018  . Gastroesophageal reflux disease 10/08/2016  . Glaucoma (increased eye pressure) 12/16/2014  . Lumbar back pain 12/16/2014  . BPH (benign prostatic hyperplasia) 11/24/2014  . DJD (degenerative joint disease) of knee 11/24/2014  . Chronic obstructive airway disease with asthma ([place]) 10/11/2014  . Essential hypertension 09/22/2014  . Open angle with borderline findings and high glaucoma risk in left eye 08/24/2014  . After cataract 02/10/2013  . Lumbar radiculopathy 12/30/2012  . Pseudophakia of both eyes 07/29/2012  . Corneal transplant failure 07/15/2012    Past Surgical History:  Procedure Laterality Date  . CATARACT EXTRACTION W/ INTRAOCULAR LENS  IMPLANT, BILATERAL Bilateral   .  CORNEAL TRANSPLANT Right   . EYE SURGERY          Home Medications    Prior to Admission medications   Medication Sig Start Date End Date Taking? Authorizing Provider  albuterol (PROVENTIL HFA;VENTOLIN HFA) 108 (90 Base) MCG/ACT inhaler Inhale 1-2 puffs into the lungs every 6 (six) hours as needed for wheezing or shortness of breath. 03/26/18   Gildardo Pounds, NP  albuterol (PROVENTIL) (2.5 MG/3ML) 0.083% nebulizer solution USE 3 MLS (2.5 MG TOTAL) BY NEBULIZATION EVERY 6 (SIX) HOURS AS NEEDED FOR WHEEZING OR SHORTNESS OF BREATH. Patient taking differently: Take 2.5 mg by nebulization every 6 (six) hours as needed for wheezing or shortness of breath. . 07/21/18   Gildardo Pounds, NP  amLODipine (NORVASC) 5 MG tablet Take 1 tablet (5 mg total) by mouth daily. 07/21/18   Gildardo Pounds, NP  aspirin EC 81 MG EC tablet Take 1 tablet (81 mg total) by mouth daily. 08/22/18   Dorrell, Andree Elk, MD  atorvastatin (LIPITOR) 40 MG tablet Take 1 tablet (40 mg total) by mouth daily. 08/21/18 08/21/19  Dorrell, Andree Elk, MD  dorzolamide (TRUSOPT) 2 % ophthalmic solution Place 1 drop into the right eye 3 (three) times daily. 07/21/18   Gildardo Pounds, NP  finasteride (PROSCAR) 5 MG tablet Take 1 tablet (5 mg total) by mouth daily. 07/21/18 10/19/18  Gildardo Pounds, NP  fluorometholone (FML) 0.1 % ophthalmic suspension Place 1 drop into both eyes daily. 07/21/18  Gildardo Pounds, NP  fluticasone (FLONASE) 50 MCG/ACT nasal spray Place 2 sprays into both nostrils daily. Patient not taking: Reported on 04/07/2018 07/26/16   Argentina Donovan, PA-C  Fluticasone-Salmeterol (ADVAIR) 250-50 MCG/DOSE AEPB Inhale 1 puff into the lungs 2 (two) times daily. Patient not taking: Reported on 08/20/2018 02/05/18   Elsie Stain, MD  latanoprost (XALATAN) 0.005 % ophthalmic solution Place 1 drop into both eyes at bedtime. 07/21/18   Gildardo Pounds, NP  losartan (COZAAR) 100 MG tablet Take 1 tablet (100 mg total) by  mouth daily. 09/25/17   Gildardo Pounds, NP  Multiple Vitamin (MULTIVITAMIN) tablet Take 1 tablet by mouth daily.    [provider]  omeprazole (PRILOSEC) 20 MG capsule Take 1 capsule (20 mg total) by mouth daily. 01/01/18 04/07/18  Gildardo Pounds, NP  tamsulosin (FLOMAX) 0.4 MG CAPS capsule Take 1 capsule (0.4 mg total) by mouth daily. 09/25/17   Gildardo Pounds, NP  timolol (TIMOPTIC) 0.5 % ophthalmic solution Place 1 drop into the right eye 2 (two) times daily. 07/21/18   Gildardo Pounds, NP    Family History Family History  Problem Relation Age of Onset  . Diabetes Mother   . Asthma Father   . Asthma Sister     Social History Social History   Tobacco Use  . Smoking status: Former Smoker    Types: Cigarettes    Last attempt to quit: 09/21/1968    Years since quitting: 49.9  . Smokeless tobacco: Never Used  . Tobacco comment: "quit smoking cigarettes in 1970"  Substance Use Topics  . Alcohol use: No  . Drug use: No     Allergies   Patient has no known allergies.   Review of Systems Review of Systems  Constitutional: Positive for activity change and fever.  Respiratory: Positive for cough, shortness of breath and wheezing.   Cardiovascular: Negative for chest pain.  Gastrointestinal: Negative for nausea and vomiting.  Allergic/Immunologic: Negative for immunocompromised state.  Hematological: Does not bruise/bleed easily.  All other systems reviewed and are negative.    Physical Exam Updated Vital Signs BP 127/80   Pulse 93   Temp 99.3 F (37.4 C) (Oral)   Resp 20   SpO2 98%   Physical Exam Vitals signs and nursing note reviewed.  Constitutional:      Appearance: He is well-developed.  HENT:     Head: Atraumatic.  Neck:     Musculoskeletal: Neck supple.  Cardiovascular:     Rate and Rhythm: Tachycardia present.  Pulmonary:     Effort: Pulmonary effort is normal. Tachypnea present.     Breath sounds: No decreased breath sounds, wheezing,  rhonchi or rales.  Musculoskeletal:     Right lower leg: He exhibits no tenderness. No edema.     Left lower leg: He exhibits no tenderness. No edema.  Skin:    General: Skin is warm.  Neurological:     Mental Status: He is alert and oriented to person, place, and time.      ED Treatments / Results  Labs (all labs ordered are listed, but only abnormal results are displayed) Labs Reviewed  BASIC METABOLIC PANEL - Abnormal; Notable for the following components:      Result Value   Glucose, Bld 154 (*)    Calcium 8.6 (*)    All other components within normal limits  CBC - Abnormal; Notable for the following components:   Hemoglobin 12.0 (*)  HCT 37.5 (*)    All other components within normal limits  LACTATE DEHYDROGENASE - Abnormal; Notable for the following components:   LDH 96 (*)    All other components within normal limits  C-REACTIVE PROTEIN - Abnormal; Notable for the following components:   CRP 3.0 (*)    All other components within normal limits  HEPATIC FUNCTION PANEL - Abnormal; Notable for the following components:   Albumin 3.4 (*)    AST 14 (*)    All other components within normal limits  DIFFERENTIAL - Abnormal; Notable for the following components:   Neutro Abs 8.5 (*)    All other components within normal limits  CULTURE, BLOOD (ROUTINE X 2)  CULTURE, BLOOD (ROUTINE X 2)  TROPONIN I  LACTIC ACID, PLASMA  URINALYSIS, ROUTINE W REFLEX MICROSCOPIC  LACTIC ACID, PLASMA  PROCALCITONIN    EKG EKG Interpretation  Date/Time:  Saturday August 30 2018 01:42:17 EDT Ventricular Rate:  120 PR Interval:    QRS Duration: 100 QT Interval:  296 QTC Calculation: 419 R Axis:   -57 Text Interpretation:  Junctional tachycardia Left anterior fascicular block Nonspecific T abnormalities, lateral leads ST elevation, consider inferior injury No acute changes No significant change since last tracing Confirmed by Varney Biles [phone]) on 08/30/2018 1:58:40 AM    Radiology Dg Chest Portable 1 View  Result Date: 08/30/2018 CLINICAL DATA:  Shortness of breath EXAM: PORTABLE CHEST 1 VIEW COMPARISON:  None. FINDINGS: The heart size and mediastinal contours are within normal limits. Both lungs are clear. The visualized skeletal structures are unremarkable. IMPRESSION: No active disease. Electronically Signed   By: Ulyses Jarred M.D.   On: 08/30/2018 02:42    Procedures Procedures (including critical care time)  Medications Ordered in ED Medications  sodium chloride flush (NS) 0.9 % injection 3 mL (3 mLs Intravenous Given 08/30/18 0242)  acetaminophen (TYLENOL) tablet 650 mg (650 mg Oral Given 08/30/18 0240)  ceFEPIme (MAXIPIME) 2 g in sodium chloride 0.9 % 100 mL IVPB (0 g Intravenous Stopped 08/30/18 0430)  azithromycin (ZITHROMAX) tablet 500 mg (500 mg Oral Given 08/30/18 8182)     Initial Impression / Assessment and Plan / ED Course  I have reviewed the triage vital signs and the nursing notes.  Pertinent labs & imaging results that were available during my care of the patient were reviewed by me and considered in my medical decision making (see chart for details).  Clinical Course as of Aug 30 511  Sat Aug 30, 2018  0321 Checks x-ray findings are reassuring. Cefepime and azithromycin has been ordered.  DG Chest Portable 1 View [AN]  9937 Could be because of multiple nebulizer treatment.  Pulse Rate(!): 107 [AN]    Clinical Course User Index [AN] Varney Biles, MD       73 year old comes in a chief complaint of shortness of breath and fever. On exam he is noted to have tachypnea and tachycardia.  He has known history of asthma/COPD and was recently admitted to the hospital for anginal work-up.  Patient started developing fevers earlier today.  His cough is productive.  He develops any flulike symptoms at this time.  Lungs are clear to auscultation.  Pneumonia is high on the differential diagnosis, could be because of coronavirus versus  bacterial.  X-ray ordered and it is not showing any acute findings.  Code sepsis was activated.   5:13 AM With all the information available to me, at this time patient does not appear to be septic.  His vital signs have improved. PSI score is 85.  He is received IV cefepime and oral azithromycin. I discussed the case with admitting service to see if they think patient can be a good candidate for observation admission, and they seem to have agreed.  LDH and CRP are back.  Not markedly elevated.   Takahiro Quezada was evaluated in Emergency Department on 08/30/2018 for the symptoms described in the history of present illness. He was evaluated in the context of the global COVID-19 pandemic, which necessitated consideration that the patient might be at risk for infection with the SARS-CoV-2 virus that causes COVID-19. Institutional protocols and algorithms that pertain to the evaluation of patients at risk for COVID-19 are in a state of rapid change based on information released by regulatory bodies including the CDC and federal and state organizations. These policies and algorithms were followed during the patient's care in the ED.   Final Clinical Impressions(s) / ED Diagnoses   Final diagnoses:  Community acquired pneumonia, unspecified laterality    ED Discharge Orders    None       Varney Biles, MD 08/30/18 2574    Varney Biles, MD 08/30/18 [phone]

## 2018-08-31 DIAGNOSIS — I444 Left anterior fascicular block: Secondary | ICD-10-CM | POA: Diagnosis not present

## 2018-08-31 DIAGNOSIS — Z947 Corneal transplant status: Secondary | ICD-10-CM | POA: Diagnosis not present

## 2018-08-31 DIAGNOSIS — R0789 Other chest pain: Secondary | ICD-10-CM | POA: Diagnosis not present

## 2018-08-31 DIAGNOSIS — J440 Chronic obstructive pulmonary disease with acute lower respiratory infection: Secondary | ICD-10-CM | POA: Diagnosis not present

## 2018-08-31 DIAGNOSIS — J069 Acute upper respiratory infection, unspecified: Secondary | ICD-10-CM | POA: Diagnosis not present

## 2018-08-31 DIAGNOSIS — J441 Chronic obstructive pulmonary disease with (acute) exacerbation: Secondary | ICD-10-CM | POA: Diagnosis not present

## 2018-08-31 DIAGNOSIS — N400 Enlarged prostate without lower urinary tract symptoms: Secondary | ICD-10-CM | POA: Diagnosis not present

## 2018-08-31 DIAGNOSIS — Z7982 Long term (current) use of aspirin: Secondary | ICD-10-CM | POA: Diagnosis not present

## 2018-08-31 DIAGNOSIS — Z7951 Long term (current) use of inhaled steroids: Secondary | ICD-10-CM | POA: Diagnosis not present

## 2018-08-31 DIAGNOSIS — Z20828 Contact with and (suspected) exposure to other viral communicable diseases: Secondary | ICD-10-CM | POA: Diagnosis not present

## 2018-08-31 DIAGNOSIS — K219 Gastro-esophageal reflux disease without esophagitis: Secondary | ICD-10-CM | POA: Diagnosis not present

## 2018-08-31 DIAGNOSIS — I2510 Atherosclerotic heart disease of native coronary artery without angina pectoris: Secondary | ICD-10-CM | POA: Diagnosis not present

## 2018-08-31 DIAGNOSIS — R079 Chest pain, unspecified: Secondary | ICD-10-CM

## 2018-08-31 DIAGNOSIS — Z833 Family history of diabetes mellitus: Secondary | ICD-10-CM | POA: Diagnosis not present

## 2018-08-31 DIAGNOSIS — I10 Essential (primary) hypertension: Secondary | ICD-10-CM | POA: Diagnosis not present

## 2018-08-31 DIAGNOSIS — R0602 Shortness of breath: Secondary | ICD-10-CM | POA: Diagnosis present

## 2018-08-31 DIAGNOSIS — Z87891 Personal history of nicotine dependence: Secondary | ICD-10-CM | POA: Diagnosis not present

## 2018-08-31 DIAGNOSIS — H409 Unspecified glaucoma: Secondary | ICD-10-CM | POA: Diagnosis not present

## 2018-08-31 DIAGNOSIS — Z825 Family history of asthma and other chronic lower respiratory diseases: Secondary | ICD-10-CM | POA: Diagnosis not present

## 2018-08-31 LAB — BASIC METABOLIC PANEL
Anion gap: 8 (ref 5–15)
BUN: 20 mg/dL (ref 8–23)
CO2: 24 mmol/L (ref 22–32)
Calcium: 9 mg/dL (ref 8.9–10.3)
Chloride: 107 mmol/L (ref 98–111)
Creatinine, Ser: 1.24 mg/dL (ref 0.61–1.24)
GFR calc Af Amer: >60 mL/min (ref >60)
GFR calc non Af Amer: 57 mL/min — ABNORMAL LOW (ref >60)
Glucose, Bld: 140 mg/dL — ABNORMAL HIGH (ref 70–99)
Potassium: 3.7 mmol/L (ref 3.5–5.1)
Sodium: 139 mmol/L (ref 135–145)

## 2018-08-31 LAB — CBC
HCT: 36.6 % — ABNORMAL LOW (ref 39.0–52.0)
Hemoglobin: 11.6 g/dL — ABNORMAL LOW (ref 13.0–17.0)
MCH: 26.5 pg (ref 26.0–34.0)
MCHC: 31.7 g/dL (ref 30.0–36.0)
MCV: 83.6 fL (ref 80.0–100.0)
Platelets: 301 10*3/uL (ref 150–400)
RBC: 4.38 MIL/uL (ref 4.22–5.81)
RDW: 12.9 % (ref 11.5–15.5)
WBC: 17.4 10*3/uL — ABNORMAL HIGH (ref 4.0–10.5)
nRBC: 0 % (ref 0.0–0.2)

## 2018-08-31 MED ORDER — DICYCLOMINE HCL 10 MG/5ML PO SOLN
10.0000 mg | Freq: Once | ORAL | Status: AC
  Administered 2018-08-31: 10 mg via ORAL
  Filled 2018-08-31: qty 5

## 2018-08-31 MED ORDER — ALUM & MAG HYDROXIDE-SIMETH 200-200-20 MG/5ML PO SUSP
30.0000 mL | Freq: Once | ORAL | Status: AC
  Administered 2018-08-31: 30 mL via ORAL
  Filled 2018-08-31: qty 30

## 2018-08-31 MED ORDER — METHYLPREDNISOLONE SODIUM SUCC 40 MG IJ SOLR
40.0000 mg | Freq: Once | INTRAMUSCULAR | Status: AC
  Administered 2018-08-31: 40 mg via INTRAVENOUS
  Filled 2018-08-31: qty 1

## 2018-08-31 MED ORDER — LOSARTAN POTASSIUM 50 MG PO TABS
100.0000 mg | ORAL_TABLET | Freq: Every day | ORAL | Status: DC
  Administered 2018-08-31 – 2018-09-02 (×3): 100 mg via ORAL
  Filled 2018-08-31 (×3): qty 2

## 2018-08-31 MED ORDER — DM-GUAIFENESIN ER 30-600 MG PO TB12
1.0000 | ORAL_TABLET | Freq: Two times a day (BID) | ORAL | Status: DC
  Administered 2018-08-31 – 2018-09-02 (×5): 1 via ORAL
  Filled 2018-08-31 (×5): qty 1

## 2018-08-31 MED ORDER — OPTICHAMBER DIAMOND MISC
1.0000 | Freq: Once | Status: AC
  Administered 2018-08-31: 1
  Filled 2018-08-31: qty 1

## 2018-08-31 NOTE — Progress Notes (Addendum)
   Subjective:  Bryan Sullivan states that he is overall doing much better than yesterday.  He does continue to have chest pain that he states began again at 7 AM this morning.  He describes it as a constant, centrally located,  nonradiating chest pain that is not pleuritic in nature.  He does believe that the chest pain has improved since onset.  He denies shortness of breath, nausea, vomiting, fevers, and chills.  Objective:  Vital signs in last 24 hours: Vitals:   08/30/18 2200 08/31/18 0216 08/31/18 0647 08/31/18 0738  BP: (!) 151/77 136/63 (!) 145/81 133/62  Pulse: 90 88 79 76  Resp:   18 16  Temp: 98.2 F (36.8 C) 98.7 F (37.1 C) 98.3 F (36.8 C) 97.7 F (36.5 C)  TempSrc: Oral Oral Oral Oral  SpO2: 100% 98% 98% 100%  Weight:      Height:       Physical Exam  Constitutional: He appears well-developed and well-nourished. No distress.  HENT:  Head: Normocephalic and atraumatic.  Eyes: Conjunctivae are normal.  Cardiovascular: Normal rate, regular rhythm and normal heart sounds.  Respiratory: Effort normal. No respiratory distress. He has wheezes. He has rales.  GI: Soft. Bowel sounds are normal. He exhibits no distension. There is no abdominal tenderness.  Musculoskeletal:        General: No edema.  Neurological: He is alert.  Skin: He is not diaphoretic. No erythema.  Psychiatric: He has a normal mood and affect. His behavior is normal. Judgment and thought content normal.   Assessment/Plan:  Bryan Sullivan is a 73 year old gentleman with COPD, CAD, and hypertension who presented with cough, dyspnea, chest tightness, and fevers concerning for COPD exacerbation. Precipitating illness is likely a viral URI and COVID specifically.  COPD exacerbation 2/2 URI: RVP negative. COVID test pending.  He has remained afebrile for the last 24 hours.  Vital signs WNL.  He continues to have mild respiratory distress with continuous coughing and diffuse wheezing in bilateral lung fields.  Patient is day 2 of po prednisone 40mg . Will give additional iv solumedrol 40mg  today.   Will keep overnight to monitor for improvement of symptoms.  -Inhaler with Dulera and albuterol -Flutter valve. Please note this may causes are aerosiliaztion so please wear N95 mask when entering room -Mucinex 30-600mg  bid -Continue azithromycin 250 mg daily for a total of 5 days, day 2 -continue Contact and droplet precautions -Blood cultures drawn 4/4 pending  Hypertension: Blood pressure remains within normal limits ranging 130-150/60-80s. Hold home amlodipine 5 mg QD   -Continue losartan 100mg  qd  CAD: Stable. Continue home Aspirin 81 mg and atorvastatin 40 mg daily  BPH: Stable.  Continue home finasteride and tamsulosin  Glaucoma: Stable.  Continue home eye drops  Dispo: Anticipated discharge in approximately 1 to 2 days.  Carroll Sage, MD 08/31/2018, 7:46 AM Pager: [phone]  Lars Mage, MD Internal Medicine PGY2 Pager:[phone] 08/31/2018, 12:24 PM

## 2018-09-01 DIAGNOSIS — R0789 Other chest pain: Secondary | ICD-10-CM

## 2018-09-01 DIAGNOSIS — J441 Chronic obstructive pulmonary disease with (acute) exacerbation: Secondary | ICD-10-CM | POA: Diagnosis not present

## 2018-09-01 DIAGNOSIS — R0602 Shortness of breath: Secondary | ICD-10-CM | POA: Diagnosis not present

## 2018-09-01 LAB — CBC
HCT: 34.5 % — ABNORMAL LOW (ref 39.0–52.0)
Hemoglobin: 11.5 g/dL — ABNORMAL LOW (ref 13.0–17.0)
MCH: 27.8 pg (ref 26.0–34.0)
MCHC: 33.3 g/dL (ref 30.0–36.0)
MCV: 83.3 fL (ref 80.0–100.0)
Platelets: 317 10*3/uL (ref 150–400)
RBC: 4.14 MIL/uL — ABNORMAL LOW (ref 4.22–5.81)
RDW: 12.7 % (ref 11.5–15.5)
WBC: 19 10*3/uL — ABNORMAL HIGH (ref 4.0–10.5)
nRBC: 0 % (ref 0.0–0.2)

## 2018-09-01 LAB — NOVEL CORONAVIRUS, NAA (HOSP ORDER, SEND-OUT TO REF LAB; TAT 18-24 HRS): SARS-CoV-2, NAA: NOT DETECTED

## 2018-09-01 MED ORDER — ALUM & MAG HYDROXIDE-SIMETH 200-200-20 MG/5ML PO SUSP
30.0000 mL | Freq: Once | ORAL | Status: AC
  Administered 2018-09-01: 30 mL via ORAL
  Filled 2018-09-01: qty 30

## 2018-09-01 MED ORDER — IPRATROPIUM-ALBUTEROL 20-100 MCG/ACT IN AERS
2.0000 | INHALATION_SPRAY | Freq: Three times a day (TID) | RESPIRATORY_TRACT | Status: DC
  Administered 2018-09-02: 2 via RESPIRATORY_TRACT
  Filled 2018-09-01: qty 4

## 2018-09-01 MED ORDER — DICYCLOMINE HCL 10 MG/5ML PO SOLN
10.0000 mg | Freq: Once | ORAL | Status: AC
  Administered 2018-09-01: 10 mg via ORAL
  Filled 2018-09-01: qty 5

## 2018-09-01 MED ORDER — AMLODIPINE BESYLATE 5 MG PO TABS
5.0000 mg | ORAL_TABLET | Freq: Every day | ORAL | Status: DC
  Administered 2018-09-01 – 2018-09-02 (×2): 5 mg via ORAL
  Filled 2018-09-01 (×2): qty 1

## 2018-09-01 NOTE — Progress Notes (Signed)
   Subjective:   [name] was sitting up in his bed doing well. He stated that he feels that his breathing has improved. He continues to have chest discomfort intermittently. He states that it might be worsened with food.   Objective:  Vital signs in last 24 hours: Vitals:   08/31/18 1525 08/31/18 1919 09/01/18 0317 09/01/18 0828  BP:  (!) 141/82 139/67 (!) 143/70  Pulse:  67 63 65  Resp:  18  16  Temp:  (!) 97.5 F (36.4 C) 98.2 F (36.8 C) 98.1 F (36.7 C)  TempSrc:  Oral Oral Oral  SpO2: 100% 97% 99% 100%  Weight:      Height:       Physical Exam  Constitutional: He appears well-developed and well-nourished. No distress.  HENT:  Head: Normocephalic and atraumatic.  Eyes: Conjunctivae are normal.  Cardiovascular: Normal rate, regular rhythm and normal heart sounds.  Respiratory: Effort normal. No respiratory distress. He has wheezes (mild amount, improved from previous day).  GI: Soft. Bowel sounds are normal. He exhibits no distension. There is no abdominal tenderness.  Musculoskeletal:        General: No edema.  Neurological: He is alert.  Skin: He is not diaphoretic.  Psychiatric: He has a normal mood and affect. His behavior is normal. Judgment and thought content normal.   Assessment/Plan:  [name] is a 73 year old gentleman with COPD, CAD, and hypertension who presented with cough, dyspnea, chest tightness, and fevers concerning for COPD exacerbation. Precipitating illness is likely a viral URI and COVID specifically.  COPD exacerbation 2/2 URI:  Covid test remains pending. Patient has remained afebrile for 48 hours. Patient has leukocytosis to 19 which is likely due to recent dose of steroids (received po prednisone 40mg  and iv solumedrol 40mg  yesterday).   Patient will be monitored today and discharged either later on today or tomorrow.  -repeater cbc tomorrow -Inhaler with Dulera and albuterol -Mucinex 30-600mg  bid -Continue azithromycin 250 mg  daily for a total of 5 days, day 2 -continue contact and droplet precautions. -Blood cultures drawn 4/4 pending  Atypical chest pain  Patient has been having a few episodes per day. He states that the GI cocktail helped him yesterday so will re-prescribe.   Hypertension: Blood pressure remains within normal limits ranging 130-150/60-80s.   -Resume home amlodipine 5 mg qd  -Continue losartan 100mg  qd  CAD:  Coronary CTA in March 2020 showed mild non-obstructive CAD  -Continue home Aspirin 81 mg -Continue atorvastatin 40 mg daily  BPH: Stable.  Continue home finasteride and tamsulosin  Glaucoma: Stable.  Continue home eye drops  Dispo: Anticipated discharge in approximately 0-1 day(s).   Lars Mage, MD 09/01/2018, 10:09 AM Pager: [phone]

## 2018-09-01 NOTE — Progress Notes (Signed)
COVID test resulted negative. Paged IM MD. Pt informed of result.  Loel Dubonnet, RN

## 2018-09-02 MED ORDER — OMEPRAZOLE 20 MG PO CPDR: 20.0000 mg | DELAYED_RELEASE_CAPSULE | Freq: Every day | ORAL | 1 refills | Status: DC

## 2018-09-02 MED ORDER — TIOTROPIUM BROMIDE MONOHYDRATE 18 MCG IN CAPS: 18.0000 ug | ORAL_CAPSULE | Freq: Every day | RESPIRATORY_TRACT | 1 refills | Status: DC

## 2018-09-02 MED ORDER — AZITHROMYCIN 250 MG PO TABS: 250.0000 mg | ORAL_TABLET | Freq: Every day | ORAL | 0 refills | Status: AC

## 2018-09-02 MED ORDER — AZITHROMYCIN 250 MG PO TABS: 250.0000 mg | ORAL_TABLET | Freq: Every day | ORAL | 0 refills | Status: DC

## 2018-09-02 MED ORDER — MOMETASONE FURO-FORMOTEROL FUM 200-5 MCG/ACT IN AERO: 2.0000 | INHALATION_SPRAY | Freq: Two times a day (BID) | RESPIRATORY_TRACT | 1 refills | Status: DC

## 2018-09-02 MED ORDER — FLUTICASONE-SALMETEROL 250-50 MCG/DOSE IN AEPB: 1.0000 | INHALATION_SPRAY | Freq: Two times a day (BID) | RESPIRATORY_TRACT | 1 refills | Status: DC

## 2018-09-02 MED FILL — !ADVAIR 250/50 DISKUS: 250-50 | 30 days supply | Qty: 60 | Fill #0

## 2018-09-02 MED FILL — OMEPRAZOLE 20 MG CAP: 20 | 30 days supply | Qty: 30 | Fill #3

## 2018-09-02 MED FILL — DULERA 200 MCG/5 MCG INH: 200-5 | 15 days supply | Qty: 13 | Fill #0

## 2018-09-02 MED FILL — SPIRIVA 18 MCG CP-HANDIHALE: 18 | 30 days supply | Qty: 30 | Fill #0

## 2018-09-02 MED FILL — AZITHROMYCIN 250 MG TABLET: 250 | 2 days supply | Qty: 2 | Fill #0

## 2018-09-02 NOTE — Progress Notes (Signed)
Patient being discharged to home. Education and instructions provided to patient . IV removed. Patient leaving unit via wheelchair.

## 2018-09-02 NOTE — Progress Notes (Addendum)
   Subjective:   He reports that his breathing has been better. He reports that he will need an inhaler.  He reports that he has been having some chest discomfort in the epigastrium region.  He states that it is worse whenever he eats.  He denies melena, hematochezia, changes in bowel movements.  He has never taken anything for acid reflux in the past.  All questions were answered.  Objective:  Vital signs in last 24 hours: Vitals:   09/01/18 1937 09/01/18 2041 09/01/18 2055 09/02/18 0609  BP:  131/75  125/70  Pulse:  61  (!) 58  Resp:  16  16  Temp:  98 F (36.7 C)  98 F (36.7 C)  TempSrc:  Oral  Oral  SpO2:  100% 98% 99%  Weight: 81.3 kg     Height:       General: Sitting up in bed in no acute distress. Cardiovascular: Normal rate, regular rhythm Respiratory: Clear to auscultation bilaterally, normal work of breathing MSK: No lower extremity edema, erythema, or warmth noted bilaterally.  Assessment/Plan:  Active Problems:   Essential hypertension   Chronic obstructive airway disease with asthma ([place])   Community acquired pneumonia  [name] is a 73 year old gentleman with COPD who presented with cough, dyspnea, chest tightness, and fevers consistent with a COPD exacerbation secondary to URI.    COPD exacerbation secondary to URI: RVP negative. COVID negative.  He continues to improve with azithromycin, steroid, and inhaler treatment as below. He has continued to remain afebrile and has had normal oxygen saturations on room air. He will be discharged with the following medications: - Dulera and Combivent inhaler.   - Azithromycin 250 mg daily for 2 more days - Prednisone 40 mg daily for 2 more days - Mucinex  Atypical chest pain: Likely due to acid reflux symptoms as it has been brought on by eating and improved with a GI cocktail. - Start PPI and OTC Tums  Hypertension: Continue home amlodipine and losartan at discharge.  CAD: Continue home aspirin 81 mg and  atorvastatin 40 mg daily at discharge  BPH: Continue home finasteride and tamsulosin at discharge  Glaucoma: Stable.  Continue home dry eyedrops.  Dispo: Anticipated discharge in today.  Carroll Sage, MD 09/02/2018, 7:54 AM Pager: [phone]

## 2018-09-02 NOTE — Discharge Summary (Signed)
Name: Bryan Sullivan MRN: [id number] DOB: [birth year] 73 y.o. PCP: Gildardo Pounds, NP  Date of Admission: 08/30/2018  1:36 AM Date of Discharge: 09/02/18 Attending Physician: Lucious Groves, DO  Discharge Diagnosis: 1.  COPD exacerbation secondary to upper respiratory infection 2.  Hypertension 3. CAD 4. Benign prostatic hypertrophy 5. Glaucoma  Discharge Medications: Allergies as of 09/02/2018   No Known Allergies     Medication List    TAKE these medications   albuterol 108 (90 Base) MCG/ACT inhaler Commonly known as:  PROVENTIL HFA;VENTOLIN HFA Inhale 1-2 puffs into the lungs every 6 (six) hours as needed for wheezing or shortness of breath. What changed:  Another medication with the same name was changed. Make sure you understand how and when to take each.   albuterol (2.5 MG/3ML) 0.083% nebulizer solution Commonly known as:  PROVENTIL USE 3 MLS (2.5 MG TOTAL) BY NEBULIZATION EVERY 6 (SIX) HOURS AS NEEDED FOR WHEEZING OR SHORTNESS OF BREATH. What changed:    how much to take  how to take this  when to take this  reasons to take this  additional instructions   amLODipine 5 MG tablet Commonly known as:  NORVASC Take 1 tablet (5 mg total) by mouth daily.   aspirin 81 MG EC tablet Take 1 tablet (81 mg total) by mouth daily.   atorvastatin 40 MG tablet Commonly known as:  Lipitor Take 1 tablet (40 mg total) by mouth daily.   azithromycin 250 MG tablet Commonly known as:  ZITHROMAX Take 1 tablet (250 mg total) by mouth daily for 2 days.   dorzolamide 2 % ophthalmic solution Commonly known as:  TRUSOPT Place 1 drop into the right eye 3 (three) times daily.   finasteride 5 MG tablet Commonly known as:  PROSCAR Take 1 tablet (5 mg total) by mouth daily.   fluorometholone 0.1 % ophthalmic suspension Commonly known as:  FML Place 1 drop into both eyes daily.   fluticasone 50 MCG/ACT nasal spray Commonly known as:  FLONASE Place 2 sprays into both  nostrils daily.   Fluticasone-Salmeterol 250-50 MCG/DOSE Aepb Commonly known as:  ADVAIR Inhale 1 puff into the lungs 2 (two) times daily.   ibuprofen 200 MG tablet Commonly known as:  ADVIL,MOTRIN Take 400 mg by mouth every 8 (eight) hours as needed for fever or moderate pain.   latanoprost 0.005 % ophthalmic solution Commonly known as:  XALATAN Place 1 drop into both eyes at bedtime.   losartan 100 MG tablet Commonly known as:  COZAAR Take 1 tablet (100 mg total) by mouth daily.   mometasone-formoterol 200-5 MCG/ACT Aero Commonly known as:  DULERA Inhale 2 puffs into the lungs 2 (two) times daily for 30 days.   multivitamin tablet Take 1 tablet by mouth daily.   omeprazole 20 MG capsule Commonly known as:  PRILOSEC Take 1 capsule (20 mg total) by mouth daily.   tamsulosin 0.4 MG Caps capsule Commonly known as:  FLOMAX Take 1 capsule (0.4 mg total) by mouth daily.   timolol 0.5 % ophthalmic solution Commonly known as:  TIMOPTIC Place 1 drop into the right eye 2 (two) times daily.   tiotropium 18 MCG inhalation capsule Commonly known as:  SPIRIVA Place 1 capsule (18 mcg total) into inhaler and inhale daily.       Disposition and follow-up:   [name] was discharged from [facility] in Stable condition.  At the hospital follow up visit please address:  1.  Please  assess respiratory status and use of inhalers.  He was discharged on [place].  2.  Labs / imaging needed at time of follow-up: None  3.  Pending labs/ test needing follow-up: None  Follow-up Appointments: Follow-up Information    Gildardo Pounds, NP. Schedule an appointment as soon as possible for a visit on 09/10/2018.   Specialty:  Nurse Practitioner Why:  You have a phone visit at 10:00 AM at April 15th. Do not go to the clinic. The doctor's will call you then.  Contact information: [place] [id number] [place] Hospital Course by problem list: 1.  COPD exacerbation secondary to upper respiratory infection: [name] presented with cough, dyspnea, wheezing, and chest tightness consistent with a COPD exacerbation likely due to an upper respiratory infection.  RVP and COVID testing were both negative.  CXR unremarkable. Blood cultures negative.  He was treated with [facility] and albuterol inhalers.  He was also given azithromycin and prednisone in the hospital.  His symptoms significantly improved and respiratory status had returned to baseline prior to discharge.  He will need to continue azithromycin 150 mg and prednisone 40 mg daily for an additional 3 days.  2.  Hypertension: Blood pressures remain within normal limits throughout his hospitalization.  His home amlodipine 5 mg daily and losartan 100 mg daily were continued.    3. CAD: Remained stable throughout admission.  His home aspirin 81 mg and atorvastatin 40 mg daily were continued.  4. Benign prostatic hypertrophy: Remained stable throughout admission.  Continue home finasteride and tamsulosin throughout admission.  5. Glaucoma: Stable.  Continued home eyedrops  Discharge Vitals:   BP (!) 150/77 (BP Location: Left Arm)   Pulse 61   Temp (!) 97.4 F (36.3 C) (Oral)   Resp 18   Ht 5\' 10"  (1.778 m)   Wt 81.3 kg   SpO2 100%   BMI 25.71 kg/m   Pertinent Labs, Studies, and Procedures:  08/30/18 CXR: FINDINGS: The heart size and mediastinal contours are within normal limits. Both lungs are clear. The visualized skeletal structures are unremarkable. IMPRESSION: No active disease.  CBC Latest Ref Rng & Units 09/01/2018 08/31/2018 08/30/2018  WBC 4.0 - 10.5 K/uL 19.0(H) 17.4(H) 10.3  Hemoglobin 13.0 - 17.0 g/dL 11.5(L) 11.6(L) 12.0(L)  Hematocrit 39.0 - 52.0 % 34.5(L) 36.6(L) 37.5(L)  Platelets 150 - 400 K/uL 317 301 284   BMP Latest Ref Rng & Units 08/31/2018 08/30/2018 08/20/2018  Glucose 70 - 99 mg/dL 140(H) 154(H) 113(H)  BUN 8 - 23 mg/dL 20 12  17   Creatinine 0.61 - 1.24 mg/dL 1.24 1.16 1.13  BUN/Creat Ratio 10 - 24 - - -  Sodium 135 - 145 mmol/L 139 136 138  Potassium 3.5 - 5.1 mmol/L 3.7 4.0 4.3  Chloride 98 - 111 mmol/L 107 104 104  CO2 22 - 32 mmol/L 24 25 26   Calcium 8.9 - 10.3 mg/dL 9.0 8.6(L) 9.1     Discharge Instructions:   Thank you so much for allowing [place] to care for you during your admission. You were found to have a COPD exacerbation and were treated with antibiotics, steroids, and inhalers. Will be very important for you to complete 2 more days of the antibiotics and prednisone. Additionally I have prescribed 2 separate inhalers that you will use every day. You have a telehealth appointment next week with your primary care doctor. At that time they will adjust  any medications as needed.    Signed: Carroll Sage, MD 09/02/2018, 10:39 AM   Pager: [phone]

## 2018-09-03 ENCOUNTER — Telehealth: Payer: Self-pay

## 2018-09-03 MED FILL — FINASTERIDE 5 MG TABLET: 5 | 30 days supply | Qty: 30 | Fill #1

## 2018-09-03 MED FILL — TAMSULOSIN HCL 0.4 MG CAP: 0.4 | 30 days supply | Qty: 30 | Fill #11

## 2018-09-03 NOTE — Telephone Encounter (Signed)
Transition Care Management Follow-up Telephone Call  Call placed with the assistance of Arabic Interpreter # [phone] with [facility] [employer].  The patient's son, Bryan Sullivan answered and stated that an interpreter is not needed as he can speak for his father. DPR on file to speak to [place].  He then added his father to the line to complete the call.     Date of discharge and from where: 09/02/2018, [facility]  How have you been since you were released from the hospital? As per son, "  He is really doing great now."  He explained that his father has problems with  "asthma" at the same time of year, every year.   Any questions or concerns? Only question was regarding medication - see below.   Items Reviewed:  Did the pt receive and understand the discharge instructions provided? Yes,  Bryan Sullivan said that they have the instructions and have no questions; but he was not at home at the time of the call.   Medications obtained and verified? Patient's son initially stated that they picked up all new medications. He was not home and did not have the medication list with him to review. He was concerned about an inhaler that he said his father used in the hospital and he does not have at home.  He did not know the name.  Informed him that [facility] is currently filling a prescription for dulera and he said that is what they have been looking for.  His father then explained  that he picked up 4 medications yesterday  - 3 pills and 1 inhaler. He confirmed that he picked up advair, azithromycin, omeprazole and knew what each one was for.  He then explained that he had another " pill."  When questioned it was determined that it was spiriva. This CM explained that is not to be taken orally. It is for an inhaler. The patient confirmed that he had not yet  taken it.  His son said he would be home this afternoon and would review the product and how to use the inhaler. Instructed him to call back with any  questions.   His son stated that he also requested refills for other medication.   Informed him that the pharmacy is in the process of filling those and all will be mailed to his father.  He confirmed the address where medications are to be delivered and he provided his credit card information. This was shared with [name], [place].  Any new allergies since your discharge?  none reported   Do you have support at home? Yes, he lives with his son and his family  Other (ie: DME, Home Health, etc) nebulizer - son purchased for him  No home health ordered  Functional Questionnaire: (I = Independent and D = Dependent) ADL's:independent - no assistive device per son.   Follow up appointments reviewed:    PCP Hospital f/u appt confirmed? 09/10/2018 @ 1000, televisit [facility] f/u appt confirmed? None scheduled at this time.   Are transportation arrangements needed? No.  Son will provide transportation if needed.   If their condition worsens, is the pt aware to call  their PCP or go to the ED? Yes   Was the patient provided with contact information for the PCP's office or ED? His son has the phone # for the clinic  Was the pt encouraged to call back with questions or concerns?yes

## 2018-09-04 LAB — CULTURE, BLOOD (ROUTINE X 2)
Culture: NO GROWTH
Culture: NO GROWTH

## 2018-09-08 ENCOUNTER — Other Ambulatory Visit: Payer: Self-pay | Admitting: Pharmacist

## 2018-09-08 DIAGNOSIS — I10 Essential (primary) hypertension: Secondary | ICD-10-CM

## 2018-09-08 MED ORDER — LOSARTAN POTASSIUM 100 MG PO TABS: 100.0000 mg | ORAL_TABLET | Freq: Every day | ORAL | 0 refills | Status: DC

## 2018-09-08 MED FILL — LOSARTAN POTASSIUM 100 MG T: 100 | 90 days supply | Qty: 90 | Fill #0

## 2018-09-10 ENCOUNTER — Ambulatory Visit: Payer: Medicare Other | Attending: Critical Care Medicine | Admitting: Critical Care Medicine

## 2018-09-10 ENCOUNTER — Other Ambulatory Visit: Payer: Self-pay

## 2018-09-10 ENCOUNTER — Encounter: Payer: Self-pay | Admitting: Critical Care Medicine

## 2018-09-10 DIAGNOSIS — J449 Chronic obstructive pulmonary disease, unspecified: Secondary | ICD-10-CM

## 2018-09-10 DIAGNOSIS — I10 Essential (primary) hypertension: Secondary | ICD-10-CM | POA: Diagnosis not present

## 2018-09-10 DIAGNOSIS — R072 Precordial pain: Secondary | ICD-10-CM

## 2018-09-10 DIAGNOSIS — K219 Gastro-esophageal reflux disease without esophagitis: Secondary | ICD-10-CM | POA: Diagnosis not present

## 2018-09-10 MED ORDER — ALBUTEROL SULFATE HFA 108 (90 BASE) MCG/ACT IN AERS: 1.0000 | INHALATION_SPRAY | Freq: Four times a day (QID) | RESPIRATORY_TRACT | 3 refills | Status: DC | PRN

## 2018-09-10 MED FILL — !PROVENTIL HFA 90 MCG INH: 108 (90 BAS | 25 days supply | Qty: 1 | Fill #0

## 2018-09-10 NOTE — Progress Notes (Signed)
Virtual Visit via Telephone Note  I connected with Bryan Sullivan on 09/10/18 at 10:00 AM EDT by telephone and verified that I am speaking with the correct person using two identifiers.   I discussed the limitations, risks, security and privacy concerns of performing an evaluation and management service by telephone and the availability of in person appointments. I also discussed with the patient that there may be a patient responsible charge related to this service. The patient expressed understanding and agreed to proceed.   History of Present Illness: This is a telephone visit in a transition of care visit and I confirmed the patient was on the phone with his son who helped to interpret This is a 73 year old Arabic male who I have seen previously for COPD and asthma overlap syndrome.  Since the last office visit in November the patient has been admitted twice once in March and again then in April of this month.  The patient had a chest pain admission initially and had a negative work-up for acute MI but CT scanning showed mild nonobstructive coronary disease and the patient was started on statin therapy and low-dose aspirin.  Then there was a visit in April between the fourth and seventh of the month.  The patient had a COPD exacerbation and was given corticosteroids and antibiotics along with nebulized therapy.  Pneumonia was ruled out.  COVID was negative as well as respiratory viral panel.  Blood cultures were negative.  The patient responded to azithromycin and corticosteroids.  The patient was given an outpatient course of azithromycin along with oral prednisone for an additional 3 days and he is now finished these medications since discharge.  Patient does maintain his Advair HFA inhaler twice daily and now is on Spiriva daily.  There is not any oxygen being applied.  He has occasional reflux symptoms.  His cough is improved his shortness of breath is better.  He has no fever or chills.  The  patient has been maintaining home blood pressure readings in the 137/84 range as noted  See below excerpts from both discharge summaries and the transition of care note from our nurse case manager who contacted the patient shortly after discharge  Admitted twice recently:   Date of Admission: 08/20/2018  7:36 AM Date of Discharge: 08/21/2018 1.  Chest pain - Started on daily aspirin and statin for mild non-obstructive CAD - Please ensure patient has appropriate outpatient f/u with cardiology if needed  2.  Labs / imaging needed at time of follow-up: none  3.  Pending labs/ test needing follow-up: none  Follow-up Appointments:  Hospital Course by problem list: 1. Chest pain: Bryan Sullivan is a 73 yo man with a medical history of HTN, asthma, prediabetes, BPH, and glaucoma of the right eye who presented to the ED with acute onset of substernal chest pain that was partially relieved by nitroglycerin. Troponins negative x3. EKG without ischemic changes x2. Coronary CTA showed a coronary calcium score of 57 and mild non-obstructive CAD. He was started on aspirin 81mg  daily and lipitor 40mg  daily. He will f/u with his PCP for  Date of Admission: 08/30/2018  1:36 AM Date of Discharge: 09/02/18 Attending Physician: Lucious Groves, DO  Discharge Diagnosis: 1.  COPD exacerbation secondary to upper respiratory infection 2.  Hypertension 3. CAD 4. Benign prostatic hypertrophy 5. Glaucoma   Disposition and follow-up:   BryanJt Sullivan was discharged from [facility] in Stable condition.  At the hospital follow up visit please  address:  1.  Please assess respiratory status and use of inhalers.  He was discharged on [place].  2.  Labs / imaging needed at time of follow-up: None  3.  Pending labs/ test needing follow-up: None  Hospital Course by problem list: 1.  COPD exacerbation secondary to upper respiratory infection: [name] presented with  cough, dyspnea, wheezing, and chest tightness consistent with a COPD exacerbation likely due to an upper respiratory infection.  RVP and COVID testing were both negative.  CXR unremarkable. Blood cultures negative.  He was treated with [facility] and albuterol inhalers.  He was also given azithromycin and prednisone in the hospital.  His symptoms significantly improved and respiratory status had returned to baseline prior to discharge.  He will need to continue azithromycin 150 mg and prednisone 40 mg daily for an additional 3 days.  2.  Hypertension: Blood pressures remain within normal limits throughout his hospitalization.  His home amlodipine 5 mg daily and losartan 100 mg daily were continued.    3. CAD: Remained stable throughout admission.  His home aspirin 81 mg and atorvastatin 40 mg daily were continued.  4. Benign prostatic hypertrophy: Remained stable throughout admission.  Continue home finasteride and tamsulosin throughout admission.  5. Glaucoma: Stable.  Continued home eyedrops  Discharge Vitals:   BP (!) 150/77 (BP Location: Left Arm)   Pulse 61   Temp (!) 97.4 F (36.3 C) (Oral)   Resp 18   Ht 5\' 10"  (1.778 m)   Wt 81.3 kg   SpO2 100%   BMI 25.71 kg/m   Pertinent Labs, Studies, and Procedures:  08/30/18 CXR: FINDINGS: The heart size and mediastinal contours are within normal limits. Both lungs are clear. The visualized skeletal structures are unremarkable. IMPRESSION: No active disease.  Call placed with the assistance of Arabic Interpreter # [phone] with [employer].  The patient's son, [name] answered and stated that an interpreter is not needed as he can speak for his father. DPR on file to speak to [place].  He then added his father to the line to complete the call.     Date of discharge and from where: 09/02/2018, [facility]  How have you been since you were released from the hospital? As per son, "  He is really doing great now."  He  explained that his father has problems with  "asthma" at the same time of year, every year.   Any questions or concerns? Only question was regarding medication - see below.   Items Reviewed:  Did the pt receive and understand the discharge instructions provided? Yes,  [name] said that they have the instructions and have no questions; but he was not at home at the time of the call.   Medications obtained and verified? Patient's son initially stated that they picked up all new medications. He was not home and did not have the medication list with him to review. He was concerned about an inhaler that he said his father used in the hospital and he does not have at home.  He did not know the name.  Informed him that [facility] is currently filling a prescription for dulera and he said that is what they have been looking for.  His father then explained  that he picked up 4 medications yesterday  - 3 pills and 1 inhaler. He confirmed that he picked up advair, azithromycin, omeprazole and knew what each one was for.  He then explained that he had  another " pill."  When questioned it was determined that it was spiriva. This CM explained that is not to be taken orally. It is for an inhaler. The patient confirmed that he had not yet  taken it.  His son said he would be home this afternoon and would review the product and how to use the inhaler. Instructed him to call back with any questions.   His son stated that he also requested refills for other medication.   Informed him that the pharmacy is in the process of filling those and all will be mailed to his father.  He confirmed the address where medications are to be delivered and he provided his credit card information. This was shared with [name], [place].  Any new allergies since your discharge?  none reported   Do you have support at home? Yes, he lives with his son and his family  Other (ie: DME, Home Health, etc) nebulizer - son  purchased for him  No home health ordered  Functional Questionnaire: (I = Independent and D = Dependent) ADL's:independent - no assistive device per son.   Follow up appointments reviewed:    PCP Hospital f/u appt confirmed? 09/10/2018 @ 1000, televisit with Dr [facility] f/u appt confirmed? None scheduled at this time.   Are transportation arrangements needed? No.  Son will provide transportation if needed.   If their condition worsens, is the pt aware to call  their PCP or go to the ED? Yes   Was the patient provided with contact information for the PCP's office or ED? His son has the phone # for the clinic  Was the pt encouraged to call back with questions or concerns?yes    Pos in BOLD Constitutional:   No  weight loss, night sweats,  Fevers, chills, fatigue, lassitude. HEENT:   No headaches,  Difficulty swallowing,  Tooth/dental problems,  Sore throat,                No sneezing, itching, ear ache, nasal congestion, post nasal drip,   CV:  No chest pain,  Orthopnea, PND, swelling in lower extremities, anasarca, dizziness, palpitations  GI   heartburn, indigestion, abdominal pain, nausea, vomiting, diarrhea, change in bowel habits, loss of appetite  Resp: No shortness of breath with exertion or at rest.  No excess mucus, productive cough,  No non-productive cough,  No coughing up of blood.  No change in color of mucus.  No wheezing.  No chest wall deformity  Skin: no rash or lesions.  GU: no dysuria, change in color of urine, no urgency or frequency.  No flank pain.  MS:  No joint pain or swelling.  No decreased range of motion.  No back pain.  Psych:  No change in mood or affect. No depression or anxiety.  No memory loss.  Observations/Objective: No observations as this was a telephone visit  Assessment and Plan: #1 chronic obstructive lung disease with asthma overlap syndrome and recent exacerbation: Patient is improving and did rule out for COVID.   Patient is now off corticosteroids and stable on combination Spiriva and Advair.  The patient has refills on all medications and as well we gave the patient instructions on how to obtain refills on his other medications.  The patient will receive a refill on the albuterol HFA inhaler to be mailed to his home.  #2 hypertension: The patient's recent readings show good control and for this the patient will will be  maintained on the losartan and amlodipine.  The omeprazole will also be maintained for reflux  Follow Up Instructions: The patient understands that he will have his medications mailed to his home for refills and an in office visit will be scheduled towards the end of May   I discussed the assessment and treatment plan with the patient. The patient was provided an opportunity to ask questions and all were answered. The patient agreed with the plan and demonstrated an understanding of the instructions.   The patient was advised to call back or seek an in-person evaluation if the symptoms worsen or if the condition fails to improve as anticipated.  I provided 25 minutes of non-face-to-face time during this encounter.   Asencion Noble, MD

## 2018-09-18 ENCOUNTER — Inpatient Hospital Stay (HOSPITAL_COMMUNITY)
Admission: EM | Admit: 2018-09-18 | Discharge: 2018-09-23 | DRG: 178 | Disposition: A | Discharge status: Home / Self Care | Payer: Medicare Other | Attending: Internal Medicine | Admitting: Internal Medicine

## 2018-09-18 ENCOUNTER — Encounter (HOSPITAL_COMMUNITY): Payer: Self-pay

## 2018-09-18 ENCOUNTER — Other Ambulatory Visit: Payer: Self-pay

## 2018-09-18 ENCOUNTER — Emergency Department (HOSPITAL_COMMUNITY): Payer: Medicare Other

## 2018-09-18 DIAGNOSIS — J449 Chronic obstructive pulmonary disease, unspecified: Secondary | ICD-10-CM

## 2018-09-18 DIAGNOSIS — Z825 Family history of asthma and other chronic lower respiratory diseases: Secondary | ICD-10-CM | POA: Diagnosis not present

## 2018-09-18 DIAGNOSIS — Z79899 Other long term (current) drug therapy: Secondary | ICD-10-CM

## 2018-09-18 DIAGNOSIS — H409 Unspecified glaucoma: Secondary | ICD-10-CM | POA: Diagnosis not present

## 2018-09-18 DIAGNOSIS — Z87891 Personal history of nicotine dependence: Secondary | ICD-10-CM

## 2018-09-18 DIAGNOSIS — R300 Dysuria: Secondary | ICD-10-CM | POA: Diagnosis not present

## 2018-09-18 DIAGNOSIS — Z833 Family history of diabetes mellitus: Secondary | ICD-10-CM

## 2018-09-18 DIAGNOSIS — J441 Chronic obstructive pulmonary disease with (acute) exacerbation: Secondary | ICD-10-CM | POA: Diagnosis present

## 2018-09-18 DIAGNOSIS — N400 Enlarged prostate without lower urinary tract symptoms: Secondary | ICD-10-CM | POA: Diagnosis not present

## 2018-09-18 DIAGNOSIS — N182 Chronic kidney disease, stage 2 (mild): Secondary | ICD-10-CM | POA: Diagnosis not present

## 2018-09-18 DIAGNOSIS — R197 Diarrhea, unspecified: Secondary | ICD-10-CM | POA: Diagnosis not present

## 2018-09-18 DIAGNOSIS — M1710 Unilateral primary osteoarthritis, unspecified knee: Secondary | ICD-10-CM | POA: Diagnosis present

## 2018-09-18 DIAGNOSIS — K219 Gastro-esophageal reflux disease without esophagitis: Secondary | ICD-10-CM | POA: Diagnosis not present

## 2018-09-18 DIAGNOSIS — I10 Essential (primary) hypertension: Secondary | ICD-10-CM | POA: Diagnosis not present

## 2018-09-18 DIAGNOSIS — N179 Acute kidney failure, unspecified: Secondary | ICD-10-CM | POA: Diagnosis present

## 2018-09-18 DIAGNOSIS — R0602 Shortness of breath: Secondary | ICD-10-CM | POA: Diagnosis not present

## 2018-09-18 DIAGNOSIS — R000 Tachycardia, unspecified: Secondary | ICD-10-CM | POA: Diagnosis not present

## 2018-09-18 DIAGNOSIS — I129 Hypertensive chronic kidney disease with stage 1 through stage 4 chronic kidney disease, or unspecified chronic kidney disease: Secondary | ICD-10-CM | POA: Diagnosis not present

## 2018-09-18 DIAGNOSIS — R6510 Systemic inflammatory response syndrome (SIRS) of non-infectious origin without acute organ dysfunction: Secondary | ICD-10-CM

## 2018-09-18 DIAGNOSIS — Z7982 Long term (current) use of aspirin: Secondary | ICD-10-CM

## 2018-09-18 DIAGNOSIS — I2510 Atherosclerotic heart disease of native coronary artery without angina pectoris: Secondary | ICD-10-CM | POA: Diagnosis not present

## 2018-09-18 DIAGNOSIS — Z7951 Long term (current) use of inhaled steroids: Secondary | ICD-10-CM | POA: Diagnosis not present

## 2018-09-18 DIAGNOSIS — J988 Other specified respiratory disorders: Secondary | ICD-10-CM

## 2018-09-18 LAB — CBC WITH DIFFERENTIAL/PLATELET
Abs Immature Granulocytes: 0 10*3/uL (ref 0.00–0.07)
Basophils Absolute: 0 10*3/uL (ref 0.0–0.1)
Basophils Relative: 0 %
Eosinophils Absolute: 0 10*3/uL (ref 0.0–0.5)
Eosinophils Relative: 0 %
HCT: 50.7 % (ref 39.0–52.0)
Hemoglobin: 16.2 g/dL (ref 13.0–17.0)
Immature Granulocytes: 0 %
Lymphocytes Relative: 14 %
Lymphs Abs: 0.3 10*3/uL — ABNORMAL LOW (ref 0.7–4.0)
MCH: 27 pg (ref 26.0–34.0)
MCHC: 32 g/dL (ref 30.0–36.0)
MCV: 84.6 fL (ref 80.0–100.0)
Monocytes Absolute: 0.3 10*3/uL (ref 0.1–1.0)
Monocytes Relative: 11 %
Neutro Abs: 1.8 10*3/uL (ref 1.7–7.7)
Neutrophils Relative %: 75 %
Platelets: 124 10*3/uL — ABNORMAL LOW (ref 150–400)
RBC: 5.99 MIL/uL — ABNORMAL HIGH (ref 4.22–5.81)
RDW: 13.1 % (ref 11.5–15.5)
WBC: 2.4 10*3/uL — ABNORMAL LOW (ref 4.0–10.5)
nRBC: 0 % (ref 0.0–0.2)

## 2018-09-18 LAB — COMPREHENSIVE METABOLIC PANEL
ALT: 17 U/L (ref 0–44)
AST: 21 U/L (ref 15–41)
Albumin: 3.5 g/dL (ref 3.5–5.0)
Alkaline Phosphatase: 56 U/L (ref 38–126)
Anion gap: 10 (ref 5–15)
BUN: 15 mg/dL (ref 8–23)
CO2: 22 mmol/L (ref 22–32)
Calcium: 8.7 mg/dL — ABNORMAL LOW (ref 8.9–10.3)
Chloride: 104 mmol/L (ref 98–111)
Creatinine, Ser: 1.31 mg/dL — ABNORMAL HIGH (ref 0.61–1.24)
GFR calc Af Amer: >60 mL/min (ref >60)
GFR calc non Af Amer: 53 mL/min — ABNORMAL LOW (ref >60)
Glucose, Bld: 113 mg/dL — ABNORMAL HIGH (ref 70–99)
Potassium: 3.9 mmol/L (ref 3.5–5.1)
Sodium: 136 mmol/L (ref 135–145)
Total Bilirubin: 0.8 mg/dL (ref 0.3–1.2)
Total Protein: 6.7 g/dL (ref 6.5–8.1)

## 2018-09-18 LAB — SARS CORONAVIRUS 2 BY RT PCR (HOSPITAL ORDER, PERFORMED IN ~~LOC~~ HOSPITAL LAB): SARS Coronavirus 2: POSITIVE — AB

## 2018-09-18 LAB — LACTIC ACID, PLASMA: Lactic Acid, Venous: 2 mmol/L (ref 0.5–1.9)

## 2018-09-18 LAB — TRIGLYCERIDES: Triglycerides: 59 mg/dL (ref <150)

## 2018-09-18 LAB — PROCALCITONIN: Procalcitonin: <0.1 ng/mL

## 2018-09-18 LAB — FERRITIN: Ferritin: 149 ng/mL (ref 24–336)

## 2018-09-18 LAB — FIBRINOGEN: Fibrinogen: 394 mg/dL (ref 210–475)

## 2018-09-18 LAB — LACTATE DEHYDROGENASE: LDH: 114 U/L (ref 98–192)

## 2018-09-18 LAB — TROPONIN I: Troponin I: <0.03 ng/mL (ref <0.03)

## 2018-09-18 LAB — C-REACTIVE PROTEIN: CRP: 1.7 mg/dL — ABNORMAL HIGH (ref <1.0)

## 2018-09-18 LAB — CK: Total CK: 94 U/L (ref 49–397)

## 2018-09-18 LAB — D-DIMER, QUANTITATIVE: D-Dimer, Quant: 0.57 ug/mL-FEU — ABNORMAL HIGH (ref 0.00–0.50)

## 2018-09-18 MED ORDER — MOMETASONE FURO-FORMOTEROL FUM 100-5 MCG/ACT IN AERO
2.0000 | INHALATION_SPRAY | Freq: Two times a day (BID) | RESPIRATORY_TRACT | Status: DC
  Administered 2018-09-18 – 2018-09-23 (×11): 2 via RESPIRATORY_TRACT
  Filled 2018-09-18: qty 8.8

## 2018-09-18 MED ORDER — FLUOROMETHOLONE 0.1 % OP SUSP
1.0000 [drp] | Freq: Every day | OPHTHALMIC | Status: DC
  Administered 2018-09-19 – 2018-09-23 (×5): 1 [drp] via OPHTHALMIC
  Filled 2018-09-18: qty 5

## 2018-09-18 MED ORDER — TIMOLOL MALEATE 0.5 % OP SOLN
1.0000 [drp] | Freq: Two times a day (BID) | OPHTHALMIC | Status: DC
  Administered 2018-09-19 – 2018-09-23 (×9): 1 [drp] via OPHTHALMIC
  Filled 2018-09-18: qty 5

## 2018-09-18 MED ORDER — LATANOPROST 0.005 % OP SOLN
1.0000 [drp] | Freq: Every day | OPHTHALMIC | Status: DC
  Administered 2018-09-18 – 2018-09-22 (×5): 1 [drp] via OPHTHALMIC
  Filled 2018-09-18: qty 2.5

## 2018-09-18 MED ORDER — METRONIDAZOLE IN NACL 5-0.79 MG/ML-% IV SOLN
500.0000 mg | Freq: Once | INTRAVENOUS | Status: DC
  Filled 2018-09-18: qty 100

## 2018-09-18 MED ORDER — LOSARTAN POTASSIUM 50 MG PO TABS
100.0000 mg | ORAL_TABLET | Freq: Every day | ORAL | Status: DC
  Administered 2018-09-19 – 2018-09-23 (×5): 100 mg via ORAL
  Filled 2018-09-18 (×6): qty 2

## 2018-09-18 MED ORDER — ENOXAPARIN SODIUM 40 MG/0.4ML ~~LOC~~ SOLN
40.0000 mg | SUBCUTANEOUS | Status: DC
  Administered 2018-09-18 – 2018-09-23 (×6): 40 mg via SUBCUTANEOUS
  Filled 2018-09-18 (×6): qty 0.4

## 2018-09-18 MED ORDER — ACETAMINOPHEN 325 MG PO TABS
650.0000 mg | ORAL_TABLET | Freq: Four times a day (QID) | ORAL | Status: DC | PRN
  Administered 2018-09-18 – 2018-09-23 (×11): 650 mg via ORAL
  Filled 2018-09-18 (×13): qty 2

## 2018-09-18 MED ORDER — ATORVASTATIN CALCIUM 40 MG PO TABS
40.0000 mg | ORAL_TABLET | Freq: Every day | ORAL | Status: DC
  Administered 2018-09-18 – 2018-09-23 (×6): 40 mg via ORAL
  Filled 2018-09-18 (×6): qty 1

## 2018-09-18 MED ORDER — GUAIFENESIN-CODEINE 100-10 MG/5ML PO SOLN
5.0000 mL | Freq: Four times a day (QID) | ORAL | Status: DC | PRN
  Administered 2018-09-18 – 2018-09-20 (×6): 5 mL via ORAL
  Filled 2018-09-18 (×6): qty 5

## 2018-09-18 MED ORDER — TAMSULOSIN HCL 0.4 MG PO CAPS
0.4000 mg | ORAL_CAPSULE | Freq: Every day | ORAL | Status: DC
  Administered 2018-09-19 – 2018-09-23 (×5): 0.4 mg via ORAL
  Filled 2018-09-18 (×6): qty 1

## 2018-09-18 MED ORDER — ALBUTEROL SULFATE HFA 108 (90 BASE) MCG/ACT IN AERS
1.0000 | INHALATION_SPRAY | RESPIRATORY_TRACT | Status: DC | PRN
  Filled 2018-09-18: qty 6.7

## 2018-09-18 MED ORDER — ONDANSETRON HCL 4 MG PO TABS: 4.0000 mg | ORAL_TABLET | Freq: Four times a day (QID) | ORAL | Status: DC | PRN

## 2018-09-18 MED ORDER — POLYETHYLENE GLYCOL 3350 17 G PO PACK
17.0000 g | PACK | Freq: Every day | ORAL | Status: DC | PRN
  Administered 2018-09-22: 17 g via ORAL
  Filled 2018-09-18: qty 1

## 2018-09-18 MED ORDER — ALBUTEROL SULFATE HFA 108 (90 BASE) MCG/ACT IN AERS: 8.0000 | INHALATION_SPRAY | Freq: Once | RESPIRATORY_TRACT | Status: DC

## 2018-09-18 MED ORDER — TIOTROPIUM BROMIDE MONOHYDRATE 18 MCG IN CAPS: 18.0000 ug | ORAL_CAPSULE | Freq: Every day | RESPIRATORY_TRACT | Status: DC

## 2018-09-18 MED ORDER — ASPIRIN EC 81 MG PO TBEC
81.0000 mg | DELAYED_RELEASE_TABLET | Freq: Every day | ORAL | Status: DC
  Administered 2018-09-19 – 2018-09-23 (×4): 81 mg via ORAL
  Filled 2018-09-18 (×6): qty 1

## 2018-09-18 MED ORDER — VANCOMYCIN HCL IN DEXTROSE 1-5 GM/200ML-% IV SOLN
1000.0000 mg | Freq: Once | INTRAVENOUS | Status: DC
  Administered 2018-09-18: 1000 mg via INTRAVENOUS
  Filled 2018-09-18: qty 200

## 2018-09-18 MED ORDER — PANTOPRAZOLE SODIUM 40 MG PO TBEC
40.0000 mg | DELAYED_RELEASE_TABLET | Freq: Every day | ORAL | Status: DC
  Administered 2018-09-19 – 2018-09-23 (×5): 40 mg via ORAL
  Filled 2018-09-18 (×6): qty 1

## 2018-09-18 MED ORDER — SODIUM CHLORIDE 0.9 % IV SOLN
2.0000 g | Freq: Once | INTRAVENOUS | Status: DC
  Administered 2018-09-18: 2 g via INTRAVENOUS
  Filled 2018-09-18: qty 2

## 2018-09-18 MED ORDER — AMLODIPINE BESYLATE 5 MG PO TABS
5.0000 mg | ORAL_TABLET | Freq: Every day | ORAL | Status: DC
  Administered 2018-09-19 – 2018-09-23 (×5): 5 mg via ORAL
  Filled 2018-09-18 (×6): qty 1

## 2018-09-18 MED ORDER — AZITHROMYCIN 250 MG PO TABS
500.0000 mg | ORAL_TABLET | Freq: Once | ORAL | Status: AC
  Administered 2018-09-18: 500 mg via ORAL
  Filled 2018-09-18: qty 2

## 2018-09-18 MED ORDER — ALBUTEROL SULFATE HFA 108 (90 BASE) MCG/ACT IN AERS
8.0000 | INHALATION_SPRAY | Freq: Once | RESPIRATORY_TRACT | Status: AC
  Administered 2018-09-18: 8 via RESPIRATORY_TRACT
  Filled 2018-09-18: qty 6.7

## 2018-09-18 MED ORDER — SODIUM CHLORIDE 0.9% FLUSH
3.0000 mL | Freq: Two times a day (BID) | INTRAVENOUS | Status: DC
  Administered 2018-09-18 – 2018-09-23 (×11): 3 mL via INTRAVENOUS

## 2018-09-18 MED ORDER — SODIUM CHLORIDE 0.9 % IV SOLN: 2.0000 g | Freq: Once | INTRAVENOUS | Status: DC

## 2018-09-18 MED ORDER — PREDNISONE 20 MG PO TABS: 40.0000 mg | ORAL_TABLET | Freq: Every day | ORAL | Status: DC

## 2018-09-18 MED ORDER — ACETAMINOPHEN 650 MG RE SUPP: 650.0000 mg | Freq: Four times a day (QID) | RECTAL | Status: DC | PRN

## 2018-09-18 MED ORDER — ASPIRIN 81 MG PO TBEC: 81.0000 mg | DELAYED_RELEASE_TABLET | Freq: Every day | ORAL | Status: DC

## 2018-09-18 MED ORDER — ONDANSETRON HCL 4 MG/2ML IJ SOLN
4.0000 mg | Freq: Four times a day (QID) | INTRAMUSCULAR | Status: DC | PRN
  Administered 2018-09-20 – 2018-09-22 (×3): 4 mg via INTRAVENOUS
  Filled 2018-09-18 (×2): qty 2

## 2018-09-18 MED ORDER — AZITHROMYCIN 250 MG PO TABS
250.0000 mg | ORAL_TABLET | Freq: Every day | ORAL | Status: AC
  Administered 2018-09-19 – 2018-09-22 (×4): 250 mg via ORAL
  Filled 2018-09-18 (×5): qty 1

## 2018-09-18 MED ORDER — UMECLIDINIUM BROMIDE 62.5 MCG/INH IN AEPB
1.0000 | INHALATION_SPRAY | Freq: Every day | RESPIRATORY_TRACT | Status: DC
  Administered 2018-09-18 – 2018-09-23 (×6): 1 via RESPIRATORY_TRACT
  Filled 2018-09-18: qty 7

## 2018-09-18 MED ORDER — SODIUM CHLORIDE 0.9 % IV BOLUS
1000.0000 mL | Freq: Once | INTRAVENOUS | Status: AC
  Administered 2018-09-18: 1000 mL via INTRAVENOUS

## 2018-09-18 NOTE — ED Triage Notes (Signed)
Pt was here recently for SOB and fever. Has persistent symptoms. Pt tachypnic in triage, SPO2 98%. HR 110, temp 100.3 cough present.

## 2018-09-18 NOTE — ED Notes (Signed)
Attempted report 

## 2018-09-18 NOTE — Progress Notes (Addendum)
Patient's blood work resulted. His COVID-19 test is positive. He is also lymphopenic and thrombocytopenic likely 2/2 active viral infection, and has a mild AKI. Procalcitonin is not elevated.   [facility] does not have non-ICU beds available at this time. Patient placement recommended admission to [facility]. He will be admitted to 2W.   - Avoid nebulizer treatments (can consider once patient is in negative pressure room), continue home inhalers   - Will discontinue prednisone at this time to avoid immunosuppression  - Hepatitis serologies  - Ordered incentive spirometer - Can consider proning as patient is currently stable  - No empiric antibiotics at this time  - Will continue to monitor closely as COPD is associated with greater likelihood of ICU admission, need for invasive ventilation, and death

## 2018-09-18 NOTE — ED Notes (Signed)
Pt resting comfortably at this time. Resp e/u.

## 2018-09-18 NOTE — H&P (Signed)
Date: 09/18/2018               Patient Name:  Bryan Sullivan MRN: [id number]  DOB: [birth year] Age / Sex: 73 y.o., male   PCP: Gildardo Pounds, NP         Medical Service: Internal Medicine Teaching Service         Attending Physician: Dr. Lucious Groves, DO    First Contact: Dr. Alfonse Spruce Pager: [phone]  Second Contact: Dr. Maricela Bo  Pager: [phone]       After Hours (After 5p/  First Contact Pager: [phone]  weekends / holidays): Second Contact Pager: [phone]   Chief Complaint: Shortness of breath   History of Present Illness:  Bryan Sullivan is a 73 yo male with a medical history of COPD, stable CAD, and HTN presenting with fever and shortness of breath after recent admission for COPD exacerbation secondary to URI 2 weeks ago. Patient was doing well in his usual state of health at home until 2 days ago when he began to experience shortness of breath and a dry cough. He also endorses chest tightness, fatigue, generalized body aches, and a sore throat. He has been using his inhalers, Spiriva and Dulera as prescribed since he was discharged. He has also been using albuterol every 6-8 hours with no improvement in dyspnea. He reports his symptoms are somewhat similar to his prior admission. He denies fever, sick contacts, recent travel, exposure to COVID or PUI, HA, changes in vision, abdominal pain, N/V, changes in bowel movement, and lower extremity swelling. He does report improvement in dyspnea after several 8 puffs of albuterol in the ED.   In the ED he was found to be febrile with a temperature of 100.3, tachycardic with HR 110s, and tachypneic with RR 26. He was normotensive.  Blood work currently in process.  COVID-19 ordered.  Chest x-ray did not show signs of pneumonia.  He received multiple puffs of albuterol as well as a dose of vancomycin and cefepime.   Meds:  Albuterol PRN  Aspirin 81 mg QD Amlodipine 5 mg QD   Spiriva  Dulera  Atorvastatin 40 mg QD  Losartan  100 mg QD  Flomax 0.4 mg QD  Prilosec 20 mg QD    Allergies: Allergies as of 09/18/2018  . (No Known Allergies)   Past Medical History:  Diagnosis Date  . Asthma    As child   . Enlarged prostate Dx 2001  . Hypertension    Dx at age 87    Family History:  Family History  Problem Relation Age of Onset  . Diabetes Mother   . Asthma Father   . Asthma Sister     Social History: Originally from [place]. Moved to the [place] "a long time ago." Lives with wife, son and grandchildren. Denies alcohol and drug use. He is a former smoker, quit in the 1970s.   Review of Systems: A complete ROS was negative except as per HPI.  Physical Exam: Blood pressure 112/67, pulse 91, temperature 100.3 F (37.9 C), temperature source Oral, resp. rate 19, SpO2 95 %.   Physical Exam  Constitutional: He is oriented to person, place, and time and well-developed, well-nourished, and in no distress.  HENT:  Head: Normocephalic and atraumatic.  Mouth/Throat: Oropharynx is clear and moist. No oropharyngeal exudate.  Eyes: Pupils are equal, round, and reactive to light. Conjunctivae are normal.  Neck: Normal range of motion. Neck supple. No JVD present.  Cardiovascular: Normal rate,  regular rhythm and normal heart sounds. Exam reveals no gallop and no friction rub.  No murmur heard. Pulmonary/Chest:  Mildly tachypneic but able to speak in full sentences. Decreased breath sounds diffusely and ronchorous breath sounds at the bases. No crackles. No muscle retractions.  Oxygenating well on room air.   Abdominal: Soft. Bowel sounds are normal. He exhibits no distension. There is no abdominal tenderness.  Musculoskeletal: Normal range of motion.        General: Edema present.  Neurological: He is alert and oriented to person, place, and time.  No focal deficits on exam  Skin: Skin is warm. No rash noted. He is not diaphoretic. Nails show no clubbing.    EKG: personally reviewed, poor quality EKG with  baseline wander, PVCs  CXR: personally reviewed my interpretation is hyperinflation of lungs, no pleural effusions, consolidations or GGOs appreciated   Assessment & Plan by Problem: Active Problems:   COPD exacerbation ([place])  # COPD exacerbation  # COVID rule out  Mr. Tugwell is presenting with a 2 day history of dyspnea, dry cough, generalized body aches, sore throat, and fever after recent admission for COPD exacerbation. He is hemodynamically stable, his tachycardia is now resolved and likely secondary to albuterol use. He is also oxygenating well on room air and his CXR does not show signs of pneumonia. Previous COVID testing was negative. This has been repeated today and is currently pending. Will place on airborne precautions in the meantime. The remainder of his blood work is pending as well. Will follow. He is already feeling improved after receiving several puffs of albuterol in the ED. Will continue as well as his home inhalers, prednisone, and azithromycin. I discontinued broad-spectrum antibiotics due to low suspicion for a bacterial infection/PNA. - Albuterol q4h PRN - Azithromycin x  5 days  - Prednisone 40 mg QD   - Continue home Spiriva and Dulera - Airborne precautions, pending COVID-19 test results  - Follow up blood work  - Tylenol 650 mg q6h PRN for fever   # HTN: Currently mildly hypertensive. Continue home losartan 100 mg and amlodipin 5 mg QD. Will continue to monitor while on steroids.   # CAD, stable: Continue home aspirin and atorvastatin 40 mg QD.    # BPF: Continue home Flomax.   # [place]: Continue home Latanaprost, Timolol, and fluorometholone.   IVF: none  VTE ppx: SQ Lovenox  Diet: HH   Code status: FULL code, confirmed on admission    Dispo: Admit patient to Observation with expected length of stay less than 2 midnights.  SignedWelford Roche, MD 09/18/2018, 8:07 AM  Pager: [phone]

## 2018-09-18 NOTE — ED Provider Notes (Signed)
[facility] EMERGENCY DEPARTMENT Provider Note   CSN: [id number] Arrival date & time: 09/18/18  0550    History   Chief Complaint Chief Complaint  Patient presents with  . Shortness of Breath    HPI Bryan Sullivan is a 73 y.o. male.     The history is provided by the patient.  Shortness of Breath  Severity:  Severe Onset quality:  Gradual Timing:  Constant Progression:  Worsening Chronicity:  Recurrent Context comment:  Recent admission for COPD and never really improved.  Fever back again.   Relieved by:  Nothing Worsened by:  Nothing Ineffective treatments:  None tried Associated symptoms: cough, fever and wheezing   Associated symptoms: no chest pain   Risk factors: no recent surgery     Past Medical History:  Diagnosis Date  . Asthma    As child   . Enlarged prostate Dx 2001  . Hypertension    Dx at age 57    Patient Active Problem List   Diagnosis Date Noted  . Chest pain 08/20/2018  . Gastroesophageal reflux disease 10/08/2016  . Glaucoma (increased eye pressure) 12/16/2014  . BPH (benign prostatic hyperplasia) 11/24/2014  . DJD (degenerative joint disease) of knee 11/24/2014  . Chronic obstructive airway disease with asthma ([place]) 10/11/2014  . Essential hypertension 09/22/2014  . Open angle with borderline findings and high glaucoma risk in left eye 08/24/2014  . After cataract 02/10/2013  . Lumbar radiculopathy 12/30/2012  . Pseudophakia of both eyes 07/29/2012  . Corneal transplant failure 07/15/2012    Past Surgical History:  Procedure Laterality Date  . CATARACT EXTRACTION W/ INTRAOCULAR LENS  IMPLANT, BILATERAL Bilateral   . CORNEAL TRANSPLANT Right   . EYE SURGERY          Home Medications    Prior to Admission medications   Medication Sig Start Date End Date Taking? Authorizing Provider  albuterol (PROVENTIL HFA;VENTOLIN HFA) 108 (90 Base) MCG/ACT inhaler Inhale 1-2 puffs into the lungs every 6 (six) hours  as needed for wheezing or shortness of breath. 09/10/18   Elsie Stain, MD  albuterol (PROVENTIL) (2.5 MG/3ML) 0.083% nebulizer solution USE 3 MLS (2.5 MG TOTAL) BY NEBULIZATION EVERY 6 (SIX) HOURS AS NEEDED FOR WHEEZING OR SHORTNESS OF BREATH. Patient taking differently: Take 2.5 mg by nebulization every 6 (six) hours as needed for wheezing or shortness of breath. . 07/21/18   Gildardo Pounds, NP  amLODipine (NORVASC) 5 MG tablet Take 1 tablet (5 mg total) by mouth daily. 07/21/18   Gildardo Pounds, NP  aspirin EC 81 MG EC tablet Take 1 tablet (81 mg total) by mouth daily. 08/22/18   Dorrell, Andree Elk, MD  atorvastatin (LIPITOR) 40 MG tablet Take 1 tablet (40 mg total) by mouth daily. 08/21/18 08/21/19  Dorrell, Andree Elk, MD  dorzolamide (TRUSOPT) 2 % ophthalmic solution Place 1 drop into the right eye 3 (three) times daily. 07/21/18   Gildardo Pounds, NP  finasteride (PROSCAR) 5 MG tablet Take 1 tablet (5 mg total) by mouth daily. 07/21/18 10/19/18  Gildardo Pounds, NP  fluorometholone (FML) 0.1 % ophthalmic suspension Place 1 drop into both eyes daily. 07/21/18   Gildardo Pounds, NP  fluticasone (FLONASE) 50 MCG/ACT nasal spray Place 2 sprays into both nostrils daily. Patient not taking: Reported on 09/10/2018 07/26/16   Argentina Donovan, PA-C  Fluticasone-Salmeterol (ADVAIR) 250-50 MCG/DOSE AEPB Inhale 1 puff into the lungs 2 (two) times daily. 09/02/18   Alfonse Spruce,  Lonia Mad, MD  ibuprofen (ADVIL,MOTRIN) 200 MG tablet Take 400 mg by mouth every 8 (eight) hours as needed for fever or moderate pain.    [provider]  latanoprost (XALATAN) 0.005 % ophthalmic solution Place 1 drop into both eyes at bedtime. 07/21/18   Gildardo Pounds, NP  losartan (COZAAR) 100 MG tablet Take 1 tablet (100 mg total) by mouth daily. 09/08/18   Charlott Rakes, MD  Multiple Vitamin (MULTIVITAMIN) tablet Take 1 tablet by mouth daily.    [provider]  omeprazole (PRILOSEC) 20 MG capsule Take 1 capsule (20  mg total) by mouth daily. 09/02/18 12/01/18  Carroll Sage, MD  tamsulosin (FLOMAX) 0.4 MG CAPS capsule Take 1 capsule (0.4 mg total) by mouth daily. 09/25/17   Gildardo Pounds, NP  timolol (TIMOPTIC) 0.5 % ophthalmic solution Place 1 drop into the right eye 2 (two) times daily. 07/21/18   Gildardo Pounds, NP  tiotropium (SPIRIVA) 18 MCG inhalation capsule Place 1 capsule (18 mcg total) into inhaler and inhale daily. 09/02/18   Carroll Sage, MD    Family History Family History  Problem Relation Age of Onset  . Diabetes Mother   . Asthma Father   . Asthma Sister     Social History Social History   Tobacco Use  . Smoking status: Former Smoker    Types: Cigarettes    Last attempt to quit: 09/21/1968    Years since quitting: 50.0  . Smokeless tobacco: Never Used  . Tobacco comment: "quit smoking cigarettes in 1970"  Substance Use Topics  . Alcohol use: No  . Drug use: No     Allergies   Patient has no known allergies.   Review of Systems Review of Systems  Constitutional: Positive for fever.  Respiratory: Positive for cough, shortness of breath and wheezing.   Cardiovascular: Negative for chest pain.  All other systems reviewed and are negative.    Physical Exam Updated Vital Signs BP (!) 132/103 (BP Location: Right Arm)   Pulse (!) 110   Temp 100.3 F (37.9 C) (Oral)   Resp (!) 24   SpO2 97%   Physical Exam Vitals signs and nursing note reviewed.  Constitutional:      Appearance: He is normal weight. He is not diaphoretic.  HENT:     Head: Normocephalic and atraumatic.     Nose: Nose normal.  Eyes:     Pupils: Pupils are equal, round, and reactive to light.  Neck:     Musculoskeletal: Normal range of motion and neck supple.  Cardiovascular:     Rate and Rhythm: Regular rhythm. Tachycardia present.     Pulses: Normal pulses.     Heart sounds: Normal heart sounds.  Pulmonary:     Breath sounds: Wheezing and rales present.  Abdominal:     General: Abdomen  is flat. Bowel sounds are normal.     Tenderness: There is no abdominal tenderness.  Musculoskeletal: Normal range of motion.     Right lower leg: No edema.     Left lower leg: No edema.  Skin:    General: Skin is warm and dry.     Capillary Refill: Capillary refill takes less than 2 seconds.  Neurological:     General: No focal deficit present.     Mental Status: He is alert.  Psychiatric:        Mood and Affect: Mood normal.      ED Treatments / Results  Labs (all  labs ordered are listed, but only abnormal results are displayed) Labs Reviewed  CULTURE, BLOOD (ROUTINE X 2)  CULTURE, BLOOD (ROUTINE X 2)  SARS CORONAVIRUS 2 (HOSPITAL ORDER, [facility])  LACTIC ACID, PLASMA  LACTIC ACID, PLASMA  COMPREHENSIVE METABOLIC PANEL  CBC WITH DIFFERENTIAL/PLATELET  URINALYSIS, ROUTINE W REFLEX MICROSCOPIC  D-DIMER, QUANTITATIVE (NOT AT [facility])  PROCALCITONIN  LACTATE DEHYDROGENASE  FERRITIN  TRIGLYCERIDES  FIBRINOGEN  C-REACTIVE PROTEIN    EKG EKG Interpretation  Date/Time:  Thursday Saleema Weppler 23 2020 06:04:18 EDT Ventricular Rate:  107 PR Interval:    QRS Duration: 112 QT Interval:  332 QTC Calculation: 441 R Axis:   -22 Text Interpretation:  Sinus tachycardia Multiform ventricular premature complexes Confirmed by Randal Buba, Reyaan Thoma ([id number]) on 09/18/2018 6:20:57 AM   Radiology No results found.  Procedures Procedures (including critical care time)  Medications Ordered in ED Medications  albuterol (VENTOLIN HFA) 108 (90 Base) MCG/ACT inhaler 8 puff (has no administration in time range)  ceFEPIme (MAXIPIME) 2 g in sodium chloride 0.9 % 100 mL IVPB (has no administration in time range)  metroNIDAZOLE (FLAGYL) IVPB 500 mg (has no administration in time range)  vancomycin (VANCOCIN) IVPB 1000 mg/200 mL premix (has no administration in time range)  albuterol (VENTOLIN HFA) 108 (90 Base) MCG/ACT inhaler 8 puff (8 puffs Inhalation Not Given 09/18/18 0616)      MDM Reviewed: previous chart, nursing note and vitals Reviewed previous: labs Interpretation: ECG and x-ray (COPD by me on CXR) Total time providing critical care: 30-74 minutes (sepsis antibiotics initiated in the ED). This excludes time spent performing separately reportable procedures and services. Consults: admitting MD  CRITICAL CARE Performed by: Kannon Baum K Laurajean Hosek-Rasch Total critical care time: 45 minutes Critical care time was exclusive of separately billable procedures and treating other patients. Critical care was necessary to treat or prevent imminent or life-threatening deterioration. Critical care was time spent personally by me on the following activities: development of treatment plan with patient and/or surrogate as well as nursing, discussions with consultants, evaluation of patient's response to treatment, examination of patient, obtaining history from patient or surrogate, ordering and performing treatments and interventions, ordering and review of laboratory studies, ordering and review of radiographic studies, pulse oximetry and re-evaluation of patient's condition. MDM:  Given that COVID is concern given fever and shortness of breath we will be holding the 30cc/ kg bolus, moreover patient is not hypotensive and does not require IVF at this time and I feel it may worsen his breathing.    Bryan Sullivan was evaluated in Emergency Department on 09/18/2018 for the symptoms described in the history of present illness. He was evaluated in the context of the global COVID-19 pandemic, which necessitated consideration that the patient might be at risk for infection with the SARS-CoV-2 virus that causes COVID-19. Institutional protocols and algorithms that pertain to the evaluation of patients at risk for COVID-19 are in a state of rapid change based on information released by regulatory bodies including the CDC and federal and state organizations. These policies and algorithms  were followed during the patient's care in the ED.     Final Clinical Impressions(s) / ED Diagnoses   Case d/w Medicine team who will see patient for admission.     Tyner Codner, MD 09/18/18 [phone]

## 2018-09-19 DIAGNOSIS — Z7951 Long term (current) use of inhaled steroids: Secondary | ICD-10-CM | POA: Diagnosis not present

## 2018-09-19 DIAGNOSIS — R300 Dysuria: Secondary | ICD-10-CM | POA: Diagnosis not present

## 2018-09-19 DIAGNOSIS — R197 Diarrhea, unspecified: Secondary | ICD-10-CM | POA: Diagnosis not present

## 2018-09-19 DIAGNOSIS — J441 Chronic obstructive pulmonary disease with (acute) exacerbation: Secondary | ICD-10-CM | POA: Diagnosis not present

## 2018-09-19 DIAGNOSIS — N400 Enlarged prostate without lower urinary tract symptoms: Secondary | ICD-10-CM | POA: Diagnosis not present

## 2018-09-19 DIAGNOSIS — N179 Acute kidney failure, unspecified: Secondary | ICD-10-CM | POA: Diagnosis not present

## 2018-09-19 DIAGNOSIS — M1710 Unilateral primary osteoarthritis, unspecified knee: Secondary | ICD-10-CM | POA: Diagnosis not present

## 2018-09-19 DIAGNOSIS — N182 Chronic kidney disease, stage 2 (mild): Secondary | ICD-10-CM | POA: Diagnosis not present

## 2018-09-19 DIAGNOSIS — K219 Gastro-esophageal reflux disease without esophagitis: Secondary | ICD-10-CM | POA: Diagnosis not present

## 2018-09-19 DIAGNOSIS — J988 Other specified respiratory disorders: Secondary | ICD-10-CM

## 2018-09-19 DIAGNOSIS — Z833 Family history of diabetes mellitus: Secondary | ICD-10-CM | POA: Diagnosis not present

## 2018-09-19 DIAGNOSIS — Z7982 Long term (current) use of aspirin: Secondary | ICD-10-CM | POA: Diagnosis not present

## 2018-09-19 DIAGNOSIS — Z825 Family history of asthma and other chronic lower respiratory diseases: Secondary | ICD-10-CM | POA: Diagnosis not present

## 2018-09-19 DIAGNOSIS — Z87891 Personal history of nicotine dependence: Secondary | ICD-10-CM | POA: Diagnosis not present

## 2018-09-19 DIAGNOSIS — I129 Hypertensive chronic kidney disease with stage 1 through stage 4 chronic kidney disease, or unspecified chronic kidney disease: Secondary | ICD-10-CM | POA: Diagnosis not present

## 2018-09-19 DIAGNOSIS — Z79899 Other long term (current) drug therapy: Secondary | ICD-10-CM | POA: Diagnosis not present

## 2018-09-19 DIAGNOSIS — R000 Tachycardia, unspecified: Secondary | ICD-10-CM | POA: Diagnosis not present

## 2018-09-19 DIAGNOSIS — I2510 Atherosclerotic heart disease of native coronary artery without angina pectoris: Secondary | ICD-10-CM | POA: Diagnosis not present

## 2018-09-19 LAB — HEPATITIS C ANTIBODY (REFLEX): HCV Ab: 0.1 s/co ratio (ref 0.0–0.9)

## 2018-09-19 LAB — HEPATITIS B SURFACE ANTIBODY, QUANTITATIVE: Hep B S AB Quant (Post): 81.7 m[IU]/mL (ref >9.9)

## 2018-09-19 LAB — HCV COMMENT:

## 2018-09-19 LAB — HEPATITIS B SURFACE ANTIGEN: Hepatitis B Surface Ag: NEGATIVE

## 2018-09-19 LAB — HEPATITIS B CORE ANTIBODY, IGM: Hep B C IgM: NEGATIVE

## 2018-09-19 LAB — HEPATITIS B CORE ANTIBODY, TOTAL: Hep B Core Total Ab: NEGATIVE

## 2018-09-19 MED ORDER — DORZOLAMIDE HCL 2 % OP SOLN
1.0000 [drp] | Freq: Three times a day (TID) | OPHTHALMIC | Status: DC
  Administered 2018-09-19 – 2018-09-23 (×13): 1 [drp] via OPHTHALMIC
  Filled 2018-09-19: qty 10

## 2018-09-19 NOTE — Progress Notes (Signed)
  Date: 09/19/2018  Patient name: Bryan Sullivan  Medical record number: [id number]  Date of birth: [birth year]   Subjective: c/o diffuse body aches, worse in legs.  No new SOB.  Objective:  Vital signs in last 24 hours: Vitals:   09/18/18 1018 09/18/18 1019 09/18/18 2320 09/19/18 0830  BP: 118/63  133/78 128/74  Pulse:   77   Resp:   20 18  Temp:  99.1 F (37.3 C) 98.9 F (37.2 C) 99 F (37.2 C)  TempSrc:  Oral Oral Oral  SpO2:   100% 100%  Weight:  81.6 kg    Height:  5\' 10"  (1.778 m)    General: resting in bed, NAD HEENT:  no scleral icterus Cardiac: RRR, no rubs, murmurs or gallops Pulm: clear to auscultation bilaterally, no wheezing, on room air Abd: soft, nontender, nondistended, BS present Ext: warm and well perfused, no pedal edema   Assessment/Plan:   COVID-19 virus infection -Subjectively feeling worse today however no increased O2 requirement is promising.  HE is certainly at high risk for clinical deterioration given age and co-morbidities,  - Spoke with his son, gave update on status also discussed for safe discharge home he will need separate bedroom and bathroom, asked him to prepare a room and obtain facemask, gloves and cleaning solution for preparation that would be needed for him to be discharged home. -Plan to continue inpatient monitoring in the interim. -Repeat monitoring labs tomorrow am. (CBC, CMP, D dimer, CK, LDH, Trop, CRP     COPD exacerbation (HCC) - mild exacerbation with some coughing but doing remarkably well currently. No need for supplemental O2 - may continue azithromycin - Avoid systemic steroids unless more wheezing/COPD component - Bronchodilators ordered, avoid nebulizer at this time  CAD -stable, no chest pain - continue ASA and atorvastatin  Dispo: High risk of clinical deterioration from COVID-19 continue to monitor inpatient while preparing for potential safe discharge home with home monitoring/isolation.   Lucious Groves,  DO 09/19/2018, 1:01 PM

## 2018-09-20 DIAGNOSIS — N179 Acute kidney failure, unspecified: Secondary | ICD-10-CM | POA: Diagnosis present

## 2018-09-20 DIAGNOSIS — N182 Chronic kidney disease, stage 2 (mild): Secondary | ICD-10-CM | POA: Diagnosis present

## 2018-09-20 LAB — CBC WITH DIFFERENTIAL/PLATELET
Abs Immature Granulocytes: 0 10*3/uL (ref 0.00–0.07)
Basophils Absolute: 0 10*3/uL (ref 0.0–0.1)
Basophils Relative: 0 %
Eosinophils Absolute: 0 10*3/uL (ref 0.0–0.5)
Eosinophils Relative: 0 %
HCT: 36.6 % — ABNORMAL LOW (ref 39.0–52.0)
Hemoglobin: 11.8 g/dL — ABNORMAL LOW (ref 13.0–17.0)
Immature Granulocytes: 0 %
Lymphocytes Relative: 30 %
Lymphs Abs: 0.7 10*3/uL (ref 0.7–4.0)
MCH: 27.1 pg (ref 26.0–34.0)
MCHC: 32.2 g/dL (ref 30.0–36.0)
MCV: 84.1 fL (ref 80.0–100.0)
Monocytes Absolute: 0.3 10*3/uL (ref 0.1–1.0)
Monocytes Relative: 11 %
Neutro Abs: 1.3 10*3/uL — ABNORMAL LOW (ref 1.7–7.7)
Neutrophils Relative %: 59 %
Platelets: 161 10*3/uL (ref 150–400)
RBC: 4.35 MIL/uL (ref 4.22–5.81)
RDW: 13.1 % (ref 11.5–15.5)
WBC: 2.3 10*3/uL — ABNORMAL LOW (ref 4.0–10.5)
nRBC: 0 % (ref 0.0–0.2)

## 2018-09-20 LAB — COMPREHENSIVE METABOLIC PANEL
ALT: 25 U/L (ref 0–44)
AST: 30 U/L (ref 15–41)
Albumin: 3 g/dL — ABNORMAL LOW (ref 3.5–5.0)
Alkaline Phosphatase: 45 U/L (ref 38–126)
Anion gap: 12 (ref 5–15)
BUN: 25 mg/dL — ABNORMAL HIGH (ref 8–23)
CO2: 21 mmol/L — ABNORMAL LOW (ref 22–32)
Calcium: 8.6 mg/dL — ABNORMAL LOW (ref 8.9–10.3)
Chloride: 103 mmol/L (ref 98–111)
Creatinine, Ser: 1.88 mg/dL — ABNORMAL HIGH (ref 0.61–1.24)
GFR calc Af Amer: 40 mL/min — ABNORMAL LOW (ref >60)
GFR calc non Af Amer: 34 mL/min — ABNORMAL LOW (ref >60)
Glucose, Bld: 92 mg/dL (ref 70–99)
Potassium: 4.4 mmol/L (ref 3.5–5.1)
Sodium: 136 mmol/L (ref 135–145)
Total Bilirubin: 0.8 mg/dL (ref 0.3–1.2)
Total Protein: 5.9 g/dL — ABNORMAL LOW (ref 6.5–8.1)

## 2018-09-20 LAB — C-REACTIVE PROTEIN: CRP: 2.5 mg/dL — ABNORMAL HIGH (ref <1.0)

## 2018-09-20 LAB — FERRITIN: Ferritin: 260 ng/mL (ref 24–336)

## 2018-09-20 LAB — LACTATE DEHYDROGENASE: LDH: 151 U/L (ref 98–192)

## 2018-09-20 LAB — TROPONIN I: Troponin I: <0.03 ng/mL (ref <0.03)

## 2018-09-20 LAB — CK: Total CK: 68 U/L (ref 49–397)

## 2018-09-20 LAB — D-DIMER, QUANTITATIVE (NOT AT ARMC): D-Dimer, Quant: 4.55 ug/mL-FEU — ABNORMAL HIGH (ref 0.00–0.50)

## 2018-09-20 MED ORDER — LACTATED RINGERS IV BOLUS
1000.0000 mL | Freq: Once | INTRAVENOUS | Status: AC
  Administered 2018-09-20: 1000 mL via INTRAVENOUS

## 2018-09-20 NOTE — Progress Notes (Signed)
  Date: 09/20/2018  Patient name: Bryan Sullivan  Medical record number: [id number]  Date of birth: 07-Jan-1946   Subjective: reports feeling terrible last night, this morning a little better, behind his ears is hurting due to wearning surgical mask constantly  Objective:  Vital signs in last 24 hours: Vitals:   09/19/18 0830 09/19/18 1634 09/19/18 2143 09/20/18 1000  BP: 128/74 109/67 (!) 142/72 132/69  Pulse:  68 76   Resp: 18 18  16   Temp: 99 F (37.2 C) 98.9 F (37.2 C) 98.9 F (37.2 C) (!) 100.5 F (38.1 C)  TempSrc: Oral Oral Oral Oral  SpO2: 100% 97% 98% 97%  Weight:      Height:      General: resting in bed, NAD HEENT:  no scleral icterus Cardiac: RRR, no rubs, murmurs or gallops Pulm: clear to auscultation bilaterally, no wheezing, on room air Abd: soft, nontender, nondistended, BS present Skin: mild tenting. Ext: warm and well perfused, no pedal edema, no posterior calf tenderness bilaterally  Results for RUGER, SAXER (MRN [id number]) as of 09/20/2018 10:36  Ref. Range 09/18/2018 06:59 09/20/2018 04:47  Lymphocyte # Latest Ref Range: 0.7 - 4.0 K/uL 0.3 (L) 0.7  Results for HOYLE, BARKDULL (MRN [id number]) as of 09/20/2018 10:36  Ref. Range 09/18/2018 06:59 09/20/2018 04:47  D-Dimer, America Brown Latest Ref Range: 0.00 - 0.50 ug/mL-FEU 0.57 (H) 4.55 (H)  Results for MARON, STANZIONE (MRN [id number]) as of 09/20/2018 10:36  Ref. Range 08/31/2018 09:05 09/18/2018 06:59 09/18/2018 10:00 09/20/2018 04:47  Creatinine Latest Ref Range: 0.61 - 1.24 mg/dL 1.24 1.31 (H)  1.88 (H)   Assessment/Plan:   COVID-19 virus infection -remains off suplemental O2 - Labs overall stable, lympocyte count appears improved however D-dimer markedly elevated now. - overall I think he may be improving some but has not clearly turned a corner, I did speak with his son [name] per patients request. Patient lives with his son, daughter in law, 4 grandchildren and wife they are in the process of  preparing an isolation room for [place] but not ready yet have obtained disinfectant but not able to get masks and gloves yet.  I discussed CDC recommendation for isolation, he reports should be able to get everything 100% in place for home isolation by Monday. -Plan repeat labs Monday morning if here till then.    COPD exacerbation (HCC) - mild exacerbation with some coughing but doing remarkably well currently. No need for supplemental O2 - may continue azithromycin - Avoid systemic steroids unless more wheezing/COPD component - Bronchodilators ordered, avoid nebulizer at this time  CAD -stable, no chest pain - continue ASA and atorvastatin  AKI on CKD stage 2 -BUN and SCr increased today, suspect mild dehydration as the cause, will give 1L LR bolus.  Dispo: High risk of clinical deterioration from COVID-19 continue to monitor inpatient while preparing for potential safe discharge home with home monitoring/isolation.   Lucious Groves, DO 09/20/2018, 10:40 AM

## 2018-09-21 DIAGNOSIS — N182 Chronic kidney disease, stage 2 (mild): Secondary | ICD-10-CM

## 2018-09-21 DIAGNOSIS — Z79899 Other long term (current) drug therapy: Secondary | ICD-10-CM

## 2018-09-21 DIAGNOSIS — J441 Chronic obstructive pulmonary disease with (acute) exacerbation: Secondary | ICD-10-CM

## 2018-09-21 DIAGNOSIS — I129 Hypertensive chronic kidney disease with stage 1 through stage 4 chronic kidney disease, or unspecified chronic kidney disease: Secondary | ICD-10-CM

## 2018-09-21 DIAGNOSIS — Z7982 Long term (current) use of aspirin: Secondary | ICD-10-CM

## 2018-09-21 DIAGNOSIS — N179 Acute kidney failure, unspecified: Secondary | ICD-10-CM

## 2018-09-21 DIAGNOSIS — I2510 Atherosclerotic heart disease of native coronary artery without angina pectoris: Secondary | ICD-10-CM

## 2018-09-21 MED ORDER — GUAIFENESIN-DM 100-10 MG/5ML PO SYRP
10.0000 mL | ORAL_SOLUTION | Freq: Every day | ORAL | Status: DC
  Administered 2018-09-21 – 2018-09-22 (×2): 10 mL via ORAL
  Filled 2018-09-21 (×2): qty 10

## 2018-09-21 MED ORDER — GUAIFENESIN-DM 100-10 MG/5ML PO SYRP: 10.0000 mL | ORAL_SOLUTION | ORAL | Status: DC | PRN

## 2018-09-21 NOTE — Progress Notes (Signed)
  Date: 09/21/2018  Patient name: Bryan Sullivan  Medical record number: [id number]  Date of birth: 1945-10-25    Subjective: Feeling fine today, has some lower back pain.  Reports no shortness of breath with getting up to the bathroom.  Still bothered by cough, asks for additional cough medicine at night.  Fever to 100.5 yesterday morning  I spoke with his son, who asked about bringing in food for him.  His family is hoping to have things ready for him to go home tomorrow.  Objective:  Vital signs in last 24 hours: Vitals:   09/20/18 1307 09/20/18 1737 09/20/18 2300 09/21/18 0838  BP:  125/68 112/70 124/67  Pulse:  76 73 80  Resp:  17 18 16   Temp: 98.2 F (36.8 C) 98.6 F (37 C) 99.5 F (37.5 C) 98.3 F (36.8 C)  TempSrc:  Oral Oral Oral  SpO2:  95% 96% 95%  Weight:      Height:        Physical Exam: General: Sitting on edge of bed, comfortable, appears tired HEENT: Moist mucous membranes, no nasal congestion Heart: Regular rate and rhythm, no murmurs Lungs: Bibasilar rhonchi, slightly prolonged expiratory phase, otherwise clear Abdomen: Soft, nontender, normal bowel sounds Extremities: No edema, normal cap refill  Significant new test results: None  Assessment/Plan:  Principal Problem:   COVID-19 virus infection Active Problems:   COPD exacerbation (HCC)   COVID-19   Acute renal failure superimposed on stage 2 chronic kidney disease ([place])  73 year old man with COPD, CAD, HTN, and CKD stage II admitted for COPD exacerbation due to COVID-19.  His symptoms remain mild and we are awaiting preparations at home to make it safe to discharge him.  #COPD exacerbation due to COVID-19 infection: - No need for supplemental O2 at this time - Continue bronchodilators by inhaler, avoiding nebulizers - No corticosteroids thus far, and with minimal wheezing and respiratory symptoms, okay to continue avoiding as there is some concern that they may worsen COVID-19 disease -  Continue azithromycin day 4/5 - Already has guaifenesin/codeine available and is taking twice daily, will add guaifenesin dextromethorphan scheduled at bedtime per his request - Plan to repeat labs tomorrow to reevaluate elevated d-dimer and inflammatory markers - His son is hopeful to have things ready for him to go home tomorrow  #Acute kidney injury on CKD stage II: - Prerenal azotemia noted on labs yesterday - Received LR 1 L IV   - No new labs today, will recheck tomorrow  #CAD: - Stable, continue home aspirin and atorvastatin  FEN: Changed to regular diet to encourage p.o. intake Prophylaxis: Continue enoxaparin Code Status: Full  Dispo: Still not safe for discharge as he would be at high risk of spreading COVID-19 to multiple family members, anticipated discharge in approximately 1-2 day(s)  Lenice Pressman, M.D., Ph.D. 09/21/2018, 11:49 AM

## 2018-09-22 LAB — CBC WITH DIFFERENTIAL/PLATELET
Abs Immature Granulocytes: 0.02 10*3/uL (ref 0.00–0.07)
Basophils Absolute: 0 10*3/uL (ref 0.0–0.1)
Basophils Relative: 0 %
Eosinophils Absolute: 0 10*3/uL (ref 0.0–0.5)
Eosinophils Relative: 0 %
HCT: 35.6 % — ABNORMAL LOW (ref 39.0–52.0)
Hemoglobin: 11.3 g/dL — ABNORMAL LOW (ref 13.0–17.0)
Immature Granulocytes: 1 %
Lymphocytes Relative: 28 %
Lymphs Abs: 0.7 10*3/uL (ref 0.7–4.0)
MCH: 26.8 pg (ref 26.0–34.0)
MCHC: 31.7 g/dL (ref 30.0–36.0)
MCV: 84.6 fL (ref 80.0–100.0)
Monocytes Absolute: 0.3 10*3/uL (ref 0.1–1.0)
Monocytes Relative: 10 %
Neutro Abs: 1.6 10*3/uL — ABNORMAL LOW (ref 1.7–7.7)
Neutrophils Relative %: 61 %
Platelets: 153 10*3/uL (ref 150–400)
RBC: 4.21 MIL/uL — ABNORMAL LOW (ref 4.22–5.81)
RDW: 13.1 % (ref 11.5–15.5)
WBC: 2.6 10*3/uL — ABNORMAL LOW (ref 4.0–10.5)
nRBC: 0 % (ref 0.0–0.2)

## 2018-09-22 LAB — COMPREHENSIVE METABOLIC PANEL
ALT: 26 U/L (ref 0–44)
AST: 27 U/L (ref 15–41)
Albumin: 2.9 g/dL — ABNORMAL LOW (ref 3.5–5.0)
Alkaline Phosphatase: 44 U/L (ref 38–126)
Anion gap: 8 (ref 5–15)
BUN: 28 mg/dL — ABNORMAL HIGH (ref 8–23)
CO2: 26 mmol/L (ref 22–32)
Calcium: 8.2 mg/dL — ABNORMAL LOW (ref 8.9–10.3)
Chloride: 105 mmol/L (ref 98–111)
Creatinine, Ser: 1.89 mg/dL — ABNORMAL HIGH (ref 0.61–1.24)
GFR calc Af Amer: 39 mL/min — ABNORMAL LOW (ref >60)
GFR calc non Af Amer: 34 mL/min — ABNORMAL LOW (ref >60)
Glucose, Bld: 108 mg/dL — ABNORMAL HIGH (ref 70–99)
Potassium: 4.4 mmol/L (ref 3.5–5.1)
Sodium: 139 mmol/L (ref 135–145)
Total Bilirubin: 0.5 mg/dL (ref 0.3–1.2)
Total Protein: 6.2 g/dL — ABNORMAL LOW (ref 6.5–8.1)

## 2018-09-22 LAB — TROPONIN I: Troponin I: <0.03 ng/mL (ref <0.03)

## 2018-09-22 LAB — D-DIMER, QUANTITATIVE: D-Dimer, Quant: <0.27 ug/mL-FEU (ref 0.00–0.50)

## 2018-09-22 LAB — C-REACTIVE PROTEIN: CRP: 3.1 mg/dL — ABNORMAL HIGH (ref <1.0)

## 2018-09-22 LAB — LACTATE DEHYDROGENASE: LDH: 146 U/L (ref 98–192)

## 2018-09-22 LAB — FERRITIN: Ferritin: 326 ng/mL (ref 24–336)

## 2018-09-22 MED ORDER — LACTATED RINGERS IV BOLUS
1000.0000 mL | Freq: Once | INTRAVENOUS | Status: AC
  Administered 2018-09-22: 1000 mL via INTRAVENOUS

## 2018-09-22 NOTE — Progress Notes (Signed)
  Date: 09/22/2018  Patient name: Bryan Sullivan  Medical record number: [id number]  Date of birth: [birth year]    Subjective: Reports feeling well today.  He had 2 episodes of diarrhea last night, but that has since stopped.  Appetite is still low, but he is trying to eat some.  Although he would ordinarily be fasting for Ramadan right now, he understands that he is ill and will make an exception from the fasting until he is better.  I spoke with his son again today.  He asked to have 1 more day to prepare things for him at home.  When we discussed the plans for getting things ready for him to come home, the son mentioned the patient would be staying in the same room as his wife.  I again reiterated that he should be isolated from the rest of the family as much as possible, including having his own bedroom and bathroom.  His son said they are trying to accommodate this.  Objective:  Vital signs in last 24 hours: Vitals:   09/20/18 2300 09/21/18 0838 09/21/18 2231 09/22/18 0742  BP: 112/70 124/67 134/72 126/73  Pulse: 73 80  84  Resp: 18 16 20 16   Temp: 99.5 F (37.5 C) 98.3 F (36.8 C) 99.3 F (37.4 C) 99.4 F (37.4 C)  TempSrc: Oral Oral Oral Oral  SpO2: 96% 95% 100% 96%  Weight:      Height:        Physical Exam: General: Lying in bed, comfortable HEENT: Moist mucous membranes, no nasal drainage Heart: Regular rate and rhythm, normal heart sounds, no murmur Lungs: Scattered rhonchi, mildly prolonged expiratory phase, normal respiratory effort Abdomen: Soft, not distended, nontender, normal bowel sounds Extremities: No edema, normal cap refill  Significant new test results: Creatinine 1.9 (unchanged) CRP 3.1 Ferritin 326 Troponin negative LDH 146 WBC 2.6 (28% lymphocytes) D-dimer negative  Assessment/Plan:  Principal Problem:   COVID-19 virus infection Active Problems:   COPD exacerbation ([facility])   COVID-19   Acute renal failure superimposed on stage 2 chronic  kidney disease ([place])  73 year old man with COPD, CAD, HTN, and CKD stage II admitted for COPD exacerbation due to COVID-19.  His symptoms remain mild and we are awaiting preparations at home to make it safe to discharge him.  #COPD exacerbation due to COVID-19 infection: - No need for supplemental O2 at this time - Continue bronchodilators by inhaler, avoiding nebulizers - No corticosteroids thus far, and with minimal wheezing and respiratory symptoms, okay to continue avoiding as there is some concern that they may worsen COVID-19 disease - Continue azithromycin day 5/5 - Continue cough suppressants - Overall, labs are improved, most reassuring is normalization of d-dimer, still with elevated CRP and leukopenia - His son is hopeful to have things ready for him to go home tomorrow, but is still having difficulty arranging things so he will be able to be isolated from the rest of the family at home  #Acute kidney injury on CKD stage II: - No improvement after gentle fluid hydration, possibly related to ongoing poor p.o. intake - We will give another round of LR 1 L IV today  #CAD: - Stable, continue home aspirin and atorvastatin  FEN:  Regular diet Prophylaxis: Continue enoxaparin Code Status: Full  Dispo: Still not safe for discharge as he would be at high risk of spreading COVID-19 to multiple family members, anticipated discharge in approximately 1-2 day(s)  Lenice Pressman, M.D., Ph.D. 09/22/2018, 1:20 PM

## 2018-09-22 NOTE — Progress Notes (Signed)
Pt complaining of some nausea, but declines zofran at this time. Pt eating a little of his roll from dinner.

## 2018-09-23 DIAGNOSIS — R300 Dysuria: Secondary | ICD-10-CM | POA: Diagnosis not present

## 2018-09-23 LAB — CULTURE, BLOOD (ROUTINE X 2)
Culture: NO GROWTH
Culture: NO GROWTH
Special Requests: ADEQUATE
Special Requests: ADEQUATE

## 2018-09-23 LAB — URINALYSIS, ROUTINE W REFLEX MICROSCOPIC
Bilirubin Urine: NEGATIVE
Glucose, UA: NEGATIVE mg/dL
Hgb urine dipstick: NEGATIVE
Ketones, ur: NEGATIVE mg/dL
Leukocytes,Ua: NEGATIVE
Nitrite: NEGATIVE
Protein, ur: NEGATIVE mg/dL
Specific Gravity, Urine: 1.017 (ref 1.005–1.030)
pH: 5 (ref 5.0–8.0)

## 2018-09-23 MED ORDER — GUAIFENESIN-DM 100-10 MG/5ML PO SYRP: 10.0000 mL | ORAL_SOLUTION | Freq: Four times a day (QID) | ORAL | 0 refills | Status: DC | PRN

## 2018-09-23 MED FILL — SM TUSSIN DM SYRUP: 100-10 | 30 days supply | Qty: 236 | Fill #0

## 2018-09-23 NOTE — Discharge Instructions (Signed)
Person Under Monitoring Name: Bryan Sullivan  Location: [address] [place] [address]   Infection Prevention Recommendations for Individuals Confirmed to have, or Being Evaluated for, 2019 Novel Coronavirus (COVID-19) Infection Who Receive Care at Home  Individuals who are confirmed to have, or are being evaluated for, COVID-19 should follow the prevention steps below until a healthcare provider or local or state health department says they can return to normal activities.  Stay home except to get medical care You should restrict activities outside your home, except for getting medical care. Do not go to work, school, or public areas, and do not use public transportation or taxis.  Call ahead before visiting your doctor Before your medical appointment, call the healthcare provider and tell them that you have, or are being evaluated for, COVID-19 infection. This will help the healthcare providers office take steps to keep other people from getting infected. Ask your healthcare provider to call the local or state health department.  Monitor your symptoms Seek prompt medical attention if your illness is worsening (e.g., difficulty breathing). Before going to your medical appointment, call the healthcare provider and tell them that you have, or are being evaluated for, COVID-19 infection. Ask your healthcare provider to call the local or state health department.  Wear a facemask You should wear a facemask that covers your nose and mouth when you are in the same room with other people and when you visit a healthcare provider. People who live with or visit you should also wear a facemask while they are in the same room with you.  Separate yourself from other people in your home As much as possible, you should stay in a different room from other people in your home. Also, you should use a separate bathroom, if available.  Avoid sharing household items You should  not share dishes, drinking glasses, cups, eating utensils, towels, bedding, or other items with other people in your home. After using these items, you should wash them thoroughly with soap and water.  Cover your coughs and sneezes Cover your mouth and nose with a tissue when you cough or sneeze, or you can cough or sneeze into your sleeve. Throw used tissues in a lined trash can, and immediately wash your hands with soap and water for at least 20 seconds or use an alcohol-based hand rub.  Wash your [facility] your hands often and thoroughly with soap and water for at least 20 seconds. You can use an alcohol-based hand sanitizer if soap and water are not available and if your hands are not visibly dirty. Avoid touching your eyes, nose, and mouth with unwashed hands.   Prevention Steps for Caregivers and Household Members of Individuals Confirmed to have, or Being Evaluated for, COVID-19 Infection Being Cared for in the Home  If you live with, or provide care at home for, a person confirmed to have, or being evaluated for, COVID-19 infection please follow these guidelines to prevent infection:  Follow healthcare providers instructions Make sure that you understand and can help the patient follow any healthcare provider instructions for all care.  Provide for the patients basic needs You should help the patient with basic needs in the home and provide support for getting groceries, prescriptions, and other personal needs.  Monitor the patients symptoms If they are getting sicker, call his or her medical provider and tell them that the patient has, or is being evaluated for, COVID-19 infection. This will help the healthcare providers  office take steps to keep other people from getting infected. Ask the healthcare provider to call the local or state health department.  Limit the number of people who have contact with the patient  If possible, have only one caregiver for the  patient.  Other household members should stay in another home or place of residence. If this is not possible, they should stay  in another room, or be separated from the patient as much as possible. Use a separate bathroom, if available.  Restrict visitors who do not have an essential need to be in the home.  Keep older adults, very young children, and other sick people away from the patient Keep older adults, very young children, and those who have compromised immune systems or chronic health conditions away from the patient. This includes people with chronic heart, lung, or kidney conditions, diabetes, and cancer.  Ensure good ventilation Make sure that shared spaces in the home have good air flow, such as from an air conditioner or an opened window, weather permitting.  Wash your hands often  Wash your hands often and thoroughly with soap and water for at least 20 seconds. You can use an alcohol based hand sanitizer if soap and water are not available and if your hands are not visibly dirty.  Avoid touching your eyes, nose, and mouth with unwashed hands.  Use disposable paper towels to dry your hands. If not available, use dedicated cloth towels and replace them when they become wet.  Wear a facemask and gloves  Wear a disposable facemask at all times in the room and gloves when you touch or have contact with the patients blood, body fluids, and/or secretions or excretions, such as sweat, saliva, sputum, nasal mucus, vomit, urine, or feces.  Ensure the mask fits over your nose and mouth tightly, and do not touch it during use.  Throw out disposable facemasks and gloves after using them. Do not reuse.  Wash your hands immediately after removing your facemask and gloves.  If your personal clothing becomes contaminated, carefully remove clothing and launder. Wash your hands after handling contaminated clothing.  Place all used disposable facemasks, gloves, and other waste in a lined  container before disposing them with other household waste.  Remove gloves and wash your hands immediately after handling these items.  Do not share dishes, glasses, or other household items with the patient  Avoid sharing household items. You should not share dishes, drinking glasses, cups, eating utensils, towels, bedding, or other items with a patient who is confirmed to have, or being evaluated for, COVID-19 infection.  After the person uses these items, you should wash them thoroughly with soap and water.  Wash laundry thoroughly  Immediately remove and wash clothes or bedding that have blood, body fluids, and/or secretions or excretions, such as sweat, saliva, sputum, nasal mucus, vomit, urine, or feces, on them.  Wear gloves when handling laundry from the patient.  Read and follow directions on labels of laundry or clothing items and detergent. In general, wash and dry with the warmest temperatures recommended on the label.  Clean all areas the individual has used often  Clean all touchable surfaces, such as counters, tabletops, doorknobs, bathroom fixtures, toilets, phones, keyboards, tablets, and bedside tables, every day. Also, clean any surfaces that may have blood, body fluids, and/or secretions or excretions on them.  Wear gloves when cleaning surfaces the patient has come in contact with.  Use a diluted bleach solution (e.g., dilute bleach with 1  part bleach and 10 parts water) or a household disinfectant with a label that says EPA-registered for coronaviruses. To make a bleach solution at home, add 1 tablespoon of bleach to 1 quart (4 cups) of water. For a larger supply, add  cup of bleach to 1 gallon (16 cups) of water.  Read labels of cleaning products and follow recommendations provided on product labels. Labels contain instructions for safe and effective use of the cleaning product including precautions you should take when applying the product, such as wearing gloves or  eye protection and making sure you have good ventilation during use of the product.  Remove gloves and wash hands immediately after cleaning.  Monitor yourself for signs and symptoms of illness Caregivers and household members are considered close contacts, should monitor their health, and will be asked to limit movement outside of the home to the extent possible. Follow the monitoring steps for close contacts listed on the symptom monitoring form.   ? If you have additional questions, contact your local health department or call the epidemiologist on call at [phone] (available 24/7). ? This guidance is subject to change. For the most up-to-date guidance from [facility], please refer to their website: [email or web address]   Person Under Monitoring Name: Bryan Sullivan  Location: [address] [place] [address]   CORONAVIRUS DISEASE 2019 (COVID-19) Guidance for Persons Under Investigation You are being tested for the virus that causes coronavirus disease 2019 (COVID-19). Public health actions are necessary to ensure protection of your health and the health of others, and to prevent further spread of infection. COVID-19 is caused by a virus that can cause symptoms, such as fever, cough, and shortness of breath. The primary transmission from person to person is by coughing or sneezing. On June 26, 2018, the [place] announced a [employer] Emergency of International Concern and on June 27, 2018 the U.[place]. Department of Health and Human Services declared a public health emergency. If the virus that causesCOVID-19 spreads in the community, it could have severe public health consequences.  As a person under investigation for COVID-19, the [facility] advises you to adhere to the following guidance until your test results are reported to  you. If your test result is positive, you will receive additional information from your provider and your local health department at that time.   Remain at home until you are cleared by your health provider or public health authorities.   Keep a log of visitors to your home using the form provided. Any visitors to your home must be aware of your isolation status.  If you plan to move to a new address or leave the county, notify the local health department in your county.  Call a doctor or seek care if you have an urgent medical need. Before seeking medical care, call ahead and get instructions from the provider before arriving at the medical office, clinic or hospital. Notify them that you are being tested for the virus that causes COVID-19 so arrangements can be made, as necessary, to prevent transmission to others in the healthcare setting. Next, notify the local health department in your county.  If a medical emergency arises and you need to call 911, inform the first responders that you are being tested for the virus that causes COVID-19. Next, notify the local health department in your county.  Adhere to all guidance set forth by the [employer] of  Public Health for Home Care of patients that is based on guidance from the Center for Disease Control and Prevention with suspected or confirmed COVID-19. It is provided with this guidance for Persons Under Investigation.  Your health and the health of our community are our top priorities. Public Health officials remain available to provide assistance and counseling to you about COVID-19 and compliance with this guidance.  Provider: ____________________________________________________________ Date: ______/_____/_________  By signing below, you acknowledge that you have read and agree to comply with this Guidance for Persons Under Investigation. ______________________________________________________________ Date:  ______/_____/_________  WHO DO I CALL? You can find a list of local health departments here: [email or web address] Health Department: ____________________________________________________________________ Contact Name: ________________________________________________________________________ Telephone: ___________________________________________________________________________  Marice Potter, [place], Communicable Disease Branch COVID-19 Guidance for Persons Under Investigation August 02, 2018   Person Under Monitoring Name: Bryan Sullivan  Location: [address] [place] [address]   Infection Prevention Recommendations for Individuals Confirmed to have, or Being Evaluated for, 2019 Novel Coronavirus (COVID-19) Infection Who Receive Care at Home  Individuals who are confirmed to have, or are being evaluated for, COVID-19 should follow the prevention steps below until a healthcare provider or local or state health department says they can return to normal activities.  Stay home except to get medical care You should restrict activities outside your home, except for getting medical care. Do not go to work, school, or public areas, and do not use public transportation or taxis.  Call ahead before visiting your doctor Before your medical appointment, call the healthcare provider and tell them that you have, or are being evaluated for, COVID-19 infection. This will help the healthcare providers office take steps to keep other people from getting infected. Ask your healthcare provider to call the local or state health department.  Monitor your symptoms Seek prompt medical attention if your illness is worsening (e.g., difficulty breathing). Before going to your medical appointment, call the healthcare provider and tell them that you have, or are being evaluated for, COVID-19 infection. Ask your  healthcare provider to call the local or state health department.  Wear a facemask You should wear a facemask that covers your nose and mouth when you are in the same room with other people and when you visit a healthcare provider. People who live with or visit you should also wear a facemask while they are in the same room with you.  Separate yourself from other people in your home As much as possible, you should stay in a different room from other people in your home. Also, you should use a separate bathroom, if available.  Avoid sharing household items You should not share dishes, drinking glasses, cups, eating utensils, towels, bedding, or other items with other people in your home. After using these items, you should wash them thoroughly with soap and water.  Cover your coughs and sneezes Cover your mouth and nose with a tissue when you cough or sneeze, or you can cough or sneeze into your sleeve. Throw used tissues in a lined trash can, and immediately wash your hands with soap and water for at least 20 seconds or use an alcohol-based hand rub.  Wash your [facility] your hands often and thoroughly with soap and water for at least 20 seconds. You can use an alcohol-based hand sanitizer if soap and water are not available and if your hands are not visibly dirty. Avoid touching your eyes, nose, and mouth with unwashed hands.  Prevention Steps for Caregivers and Household Members of Individuals Confirmed to have, or Being Evaluated for, COVID-19 Infection Being Cared for in the Home  If you live with, or provide care at home for, a person confirmed to have, or being evaluated for, COVID-19 infection please follow these guidelines to prevent infection:  Follow healthcare providers instructions Make sure that you understand and can help the patient follow any healthcare provider instructions for all care.  Provide for the patients basic needs You should help the patient with  basic needs in the home and provide support for getting groceries, prescriptions, and other personal needs.  Monitor the patients symptoms If they are getting sicker, call his or her medical provider and tell them that the patient has, or is being evaluated for, COVID-19 infection. This will help the healthcare providers office take steps to keep other people from getting infected. Ask the healthcare provider to call the local or state health department.  Limit the number of people who have contact with the patient  If possible, have only one caregiver for the patient.  Other household members should stay in another home or place of residence. If this is not possible, they should stay  in another room, or be separated from the patient as much as possible. Use a separate bathroom, if available.  Restrict visitors who do not have an essential need to be in the home.  Keep older adults, very young children, and other sick people away from the patient Keep older adults, very young children, and those who have compromised immune systems or chronic health conditions away from the patient. This includes people with chronic heart, lung, or kidney conditions, diabetes, and cancer.  Ensure good ventilation Make sure that shared spaces in the home have good air flow, such as from an air conditioner or an opened window, weather permitting.  Wash your hands often  Wash your hands often and thoroughly with soap and water for at least 20 seconds. You can use an alcohol based hand sanitizer if soap and water are not available and if your hands are not visibly dirty.  Avoid touching your eyes, nose, and mouth with unwashed hands.  Use disposable paper towels to dry your hands. If not available, use dedicated cloth towels and replace them when they become wet.  Wear a facemask and gloves  Wear a disposable facemask at all times in the room and gloves when you touch or have contact with the  patients blood, body fluids, and/or secretions or excretions, such as sweat, saliva, sputum, nasal mucus, vomit, urine, or feces.  Ensure the mask fits over your nose and mouth tightly, and do not touch it during use.  Throw out disposable facemasks and gloves after using them. Do not reuse.  Wash your hands immediately after removing your facemask and gloves.  If your personal clothing becomes contaminated, carefully remove clothing and launder. Wash your hands after handling contaminated clothing.  Place all used disposable facemasks, gloves, and other waste in a lined container before disposing them with other household waste.  Remove gloves and wash your hands immediately after handling these items.  Do not share dishes, glasses, or other household items with the patient  Avoid sharing household items. You should not share dishes, drinking glasses, cups, eating utensils, towels, bedding, or other items with a patient who is confirmed to have, or being evaluated for, COVID-19 infection.  After the person uses these items, you should wash them thoroughly with soap and  water.  Wash laundry thoroughly  Immediately remove and wash clothes or bedding that have blood, body fluids, and/or secretions or excretions, such as sweat, saliva, sputum, nasal mucus, vomit, urine, or feces, on them.  Wear gloves when handling laundry from the patient.  Read and follow directions on labels of laundry or clothing items and detergent. In general, wash and dry with the warmest temperatures recommended on the label.  Clean all areas the individual has used often  Clean all touchable surfaces, such as counters, tabletops, doorknobs, bathroom fixtures, toilets, phones, keyboards, tablets, and bedside tables, every day. Also, clean any surfaces that may have blood, body fluids, and/or secretions or excretions on them.  Wear gloves when cleaning surfaces the patient has come in contact with.  Use a diluted  bleach solution (e.g., dilute bleach with 1 part bleach and 10 parts water) or a household disinfectant with a label that says EPA-registered for coronaviruses. To make a bleach solution at home, add 1 tablespoon of bleach to 1 quart (4 cups) of water. For a larger supply, add  cup of bleach to 1 gallon (16 cups) of water.  Read labels of cleaning products and follow recommendations provided on product labels. Labels contain instructions for safe and effective use of the cleaning product including precautions you should take when applying the product, such as wearing gloves or eye protection and making sure you have good ventilation during use of the product.  Remove gloves and wash hands immediately after cleaning.  Monitor yourself for signs and symptoms of illness Caregivers and household members are considered close contacts, should monitor their health, and will be asked to limit movement outside of the home to the extent possible. Follow the monitoring steps for close contacts listed on the symptom monitoring form.   ? If you have additional questions, contact your local health department or call the epidemiologist on call at [phone] (available 24/7). ? This guidance is subject to change. For the most up-to-date guidance from [facility], please refer to their website: [email or web address] were admitted to the hospital for a COPD exacerbation caused by COVID-19 (also known as novel coronavirus or SARS-CoV-2) infection.  We treated you for your COPD exacerbation with azithromycin and inhalers.  It is now safe for you to go home, but you are likely still contagious.  For this reason, it is very important that you remain in isolation for at least 2 weeks after your symptoms have resolved.  This means you should sleep in your own room by yourself, use a separate bathroom just for you, and eat separately from your family.  When you are in a  room shared with other people, please wear a mask.  Please avoid going out in public.  Your family members should try to stay home as much as possible except to go to the store for essentials like food.  You also have evidence of a urine infection.  We gave you a dose of an antibiotic in the hospital.  Starting tomorrow, please take cefdinir (a pill antibiotic) 1 tablet in the morning and 1 tablet at night for the next 6 days.  Please follow-up with your primary doctor next week.  They will likely need to check some blood work for you.  If you have fever, difficulty breathing, chest pain, or vomiting, please call your primary doctor.  Thank you for trusting [place] with your care.  It was a pleasure taking care of you.  I wish you a blessed Ramadan.

## 2018-09-23 NOTE — Discharge Summary (Addendum)
[facility] Discharge Note  Name: Bryan Sullivan MRN: [id number] DOB: December 15, 1945 73 y.o. PCP: Bryan Pounds, NP  Date of Admission: 09/18/2018  5:52 AM Date of Discharge:  09/23/18  Attending Physician: Lenice Pressman, MD, PhD  Discharge Diagnosis: 1.  COPD exacerbation due to COVID-19 infection 2.  Acute kidney injury on CKD stage II  Discharge Medications: Allergies as of 09/23/2018   No Known Allergies     Medication List    TAKE these medications   albuterol (2.5 MG/3ML) 0.083% nebulizer solution Commonly known as:  PROVENTIL USE 3 MLS (2.5 MG TOTAL) BY NEBULIZATION EVERY 6 (SIX) HOURS AS NEEDED FOR WHEEZING OR SHORTNESS OF BREATH.   albuterol 108 (90 Base) MCG/ACT inhaler Commonly known as:  VENTOLIN HFA Inhale 1-2 puffs into the lungs every 6 (six) hours as needed for wheezing or shortness of breath.   amLODipine 5 MG tablet Commonly known as:  NORVASC Take 1 tablet (5 mg total) by mouth daily.   aspirin 81 MG EC tablet Take 1 tablet (81 mg total) by mouth daily.   atorvastatin 40 MG tablet Commonly known as:  Lipitor Take 1 tablet (40 mg total) by mouth daily.   dorzolamide 2 % ophthalmic solution Commonly known as:  TRUSOPT Place 1 drop into the right eye 3 (three) times daily.   finasteride 5 MG tablet Commonly known as:  PROSCAR Take 1 tablet (5 mg total) by mouth daily.   fluorometholone 0.1 % ophthalmic suspension Commonly known as:  FML Place 1 drop into both eyes daily.   fluticasone 50 MCG/ACT nasal spray Commonly known as:  FLONASE Place 2 sprays into both nostrils daily.   Fluticasone-Salmeterol 250-50 MCG/DOSE Aepb Commonly known as:  ADVAIR Inhale 1 puff into the lungs 2 (two) times daily.   guaiFENesin-dextromethorphan 100-10 MG/5ML syrup Commonly known as:  ROBITUSSIN DM Take 10 mLs by mouth every 6 (six) hours as needed for cough.   ibuprofen 200 MG tablet Commonly known as:   ADVIL Take 400 mg by mouth every 8 (eight) hours as needed for fever or moderate pain.   latanoprost 0.005 % ophthalmic solution Commonly known as:  XALATAN Place 1 drop into both eyes at bedtime.   losartan 100 MG tablet Commonly known as:  COZAAR Take 1 tablet (100 mg total) by mouth daily.   omeprazole 20 MG capsule Commonly known as:  PRILOSEC Take 1 capsule (20 mg total) by mouth daily.   tamsulosin 0.4 MG Caps capsule Commonly known as:  FLOMAX Take 1 capsule (0.4 mg total) by mouth daily.   timolol 0.5 % ophthalmic solution Commonly known as:  TIMOPTIC Place 1 drop into the right eye 2 (two) times daily.   tiotropium 18 MCG inhalation capsule Commonly known as:  SPIRIVA Place 1 capsule (18 mcg total) into inhaler and inhale daily.       Disposition and follow-up:   [name] was discharged from [facility] in Good condition.  At the hospital follow up visit please address:  1.  COVID-19 infection: Please follow-up his symptoms.  He had relatively mild symptoms of COVID-19 and a COPD exacerbation, but had a prolonged hospital stay due to the risk of infecting his family at home.  2. Acute kidney injury: He had an increase in his creatinine during his hospitalization, likely related to volume depletion and poor p.o. intake.  It had not improved significantly by the time of discharge, despite receiving some IV fluid  boluses.  Please recheck his renal function and electrolytes to ensure they have returned to normal.  3.  Labs / imaging needed at time of follow-up: BMP  4.  Pending labs/ test needing follow-up: Urine culture  Follow-up Appointments: Follow-up Information    [facility] on 09/30/2018.   Why:  appt is a telehealth visit at 9:30a doctors office will call at time of appt.  if you could call a day or 2 before appointment to confirm phone number on file. Contact information: [place] [phone] [facility] Course by problem list: 1.  COVID-19 infection: [name] is a 73 year old man with history of COPD, stable CAD, and HTN who was admitted with fever, shortness of breath, and cough.  He tested positive for SARS-CoV-2.  CRP was mildly elevated at 1.7 but procalcitonin was negative, and he had mild leukopenia of 2.4 and lymphopenia of 0.3, all consistent with COVID-19.  He was initially treated with broad-spectrum antibiotics in the ED, but this was narrowed to azithromycin for his COPD exacerbation.  He had mild hypoxia initially that responded to O2 2 L by nasal cannula, and this was weaned quickly.  His fever curve also improved quickly.  Given his overall mild symptoms, he was never treated with any of our experimental therapies for COVID-19.  As is reported with many COVID-19 cases, his d-dimer was elevated at 0.6, increased to 4.6, then decreased to undetectable the day before discharge.  He was kept on DVT prophylaxis throughout his stay and did not have any overt signs of clotting requiring increased dosage.  Our team had extensive discussions with his family to help them arrange for safe quarantine at home.  He lives with his wife, his son, and his son's family.  It took a number of days for them to have a separate room available for him and they are all aware that he should remain in self quarantine for at least 14 more days.  2.  Acute kidney injury on CKD stage II: Baseline creatinine appears to be approximately 1.1-1.2.  On admission, creatinine was 1.3 and rose to 1.9.  This was likely related to his viral infection and poor p.o. intake.  He received IV fluids, but his renal function had not returned to normal at the time of discharge.  Creatinine was 1.9 on the day before discharge.  Please recheck his creatinine in follow-up to ensure his renal function returns to baseline.  3.  Dysuria: On the day of  discharge he noted burning with urination.  Urinalysis was completely normal with no leukocyte esterase or nitrites.  Urine culture was collected and is pending at the time of discharge.  Given his completely normal urinalysis, he was not started on antibiotics.  However, should he have ongoing symptoms of dysuria, would recommend rechecking urinalysis and urine culture and considering antibiotics.  He does have a history of BPH and may be having lower urinary tract symptoms related to that.  Discharge Vitals:   BP 118/77 (BP Location: Right Arm)   Pulse 83   Temp 100.2 F (37.9 C) (Oral)   Resp 20   Ht 5\' 10"  (1.778 m)   Wt 81.6 kg   SpO2 95%   BMI 25.81 kg/m    Discharge Physical Exam: General: Lying in bed, comfortable Heart: Regular rate and rhythm, normal heart sounds Lungs: Scattered rhonchi, normal respiratory effort  Abdomen: Soft, nontender, not distended, no suprapubic tenderness, normal bowel sounds Extremities: No swelling, warm, good cap refill  Pertinent Labs, Studies, and Procedures:  SARS-CoV-2 positive Creatinine 1.3, 1.9, 1.9 WBC 2.4, 2.3, 2.6 Absolute lymphocyte count 0.3, 0.7, 0.7 D-dimer 0.6, 4.6, <0.3 CRP 1.7, 2.5, 3.1 Procalcitonin <0.1 Urinalysis SG 1.02, pH 5, negative leukocyte esterase, negative nitrites Urine culture pending (collected 09/23/2018) Blood cultures from admission negative (collected 09/18/2018)  Discharge Instructions: You were admitted to the hospital for a COPD exacerbation caused by COVID-19 (also known as novel coronavirus or SARS-CoV-2) infection.  We treated you for your COPD exacerbation with azithromycin and inhalers.  It is now safe for you to go home, but you are likely still contagious.  For this reason, it is very important that you remain in isolation for at least 2 weeks after your symptoms have resolved.  This means you should sleep in your own room by yourself, use a separate bathroom just for you, and eat separately from your  family.  When you are in a room shared with other people, please wear a mask.  Please avoid going out in public.  Your family members should try to stay home as much as possible except to go to the store for essentials like food.  You also have evidence of a urine infection.  We gave you a dose of an antibiotic in the hospital.  Starting tomorrow, please take cefdinir (a pill antibiotic) 1 tablet in the morning and 1 tablet at night for the next 6 days.  Please follow-up with your primary doctor next week.  They will likely need to check some blood work for you.  If you have fever, difficulty breathing, chest pain, or vomiting, please call your primary doctor.  Thank you for trusting [place] with your care.  It was a pleasure taking care of you.  I wish you a blessed Ramadan.  Signed: Lenice Pressman, MD, PhD 09/23/2018, 1:17 PM     I personally spent greater than 30 minutes discussing the plan of care with the patient and his family and coordinating his discharge plan.

## 2018-09-23 NOTE — Progress Notes (Addendum)
  Date: 09/23/2018  Patient name: Bryan Sullivan  Medical record number: [id number]  Date of birth: 12/27/1945    Subjective: He says it is hot when he pees.  Otherwise, he is feeling much better and says his breathing and cough have improved.  His appetite is still low.  He is worried about the dysuria and thinks he should stay until it is all better.  I spoke with his son by phone, and he stated everything is ready for his father to come home.  They have things arranged so he can stay in a separate room.  Objective:  Vital signs in last 24 hours: Vitals:   09/22/18 0742 09/22/18 1649 09/23/18 0043 09/23/18 0830  BP: 126/73 118/66 (!) 104/58 118/77  Pulse: 84 78 73 83  Resp: 16 16 20    Temp: 99.4 F (37.4 C) 98.8 F (37.1 C) 99.8 F (37.7 C) 100.2 F (37.9 C)  TempSrc: Oral Oral Oral Oral  SpO2: 96% 96% 98% 95%  Weight:      Height:        Physical Exam: General: Lying in bed, comfortable Heart: Regular rate and rhythm, normal heart sounds Lungs: Scattered rhonchi, normal respiratory effort Abdomen: Soft, nontender, not distended, no suprapubic tenderness, normal bowel sounds Extremities: No swelling, warm, good cap refill  Significant new test results: None  Assessment/Plan:  Principal Problem:   COVID-19 virus infection Active Problems:   COPD exacerbation (HCC)   COVID-19   Acute renal failure superimposed on stage 2 chronic kidney disease ([place])  73 year old man with COPD, CAD, HTN, and CKD stage II admitted for COPD exacerbation due to COVID-19. His symptoms remain mild and are improving and he is medically ready for discharge.  Unfortunately, he developed dysuria today and we will check a urinalysis and urine culture and plan to discharge him on antibiotics for likely cystitis.  #COPD exacerbation due to COVID-19 infection: - Continue bronchodilators by inhaler, avoiding nebulizers - No corticosteroids needed -  Completed 5-day course of azithromycin -  Continue cough suppressants - Overall, labs are improved, most reassuring is normalization of d-dimer, still with elevated CRP and leukopenia -  His family has things ready for him to go home today after we evaluate and form a treatment plan for his dysuria  #Dysuria: Based on his description, likely cystitis.  He did have some mild nausea last night and T-max overnight 100.2, so he may have early pyelonephritis.  Assuming urine culture confirms pyuria, will give 1 dose of ceftriaxone and send him home with 6 more days of cefdinir  #Acute kidney injury on CKD stage II: -  Received an additional IV fluid bolus yesterday, intake is still been poor, will need to encourage fluid intake at home and plan recheck at his PCP in the near future  #CAD: - Stable, continue home aspirin and atorvastatin  FEN: Regular diet Prophylaxis:Continue enoxaparin Code Status:Full  Dispo:Plan discharge home today after evaluation of his dysuria  Lenice Pressman, M.D., Ph.D. 09/23/2018, 10:55 AM

## 2018-09-24 ENCOUNTER — Telehealth: Payer: Self-pay

## 2018-09-24 LAB — URINE CULTURE: Culture: NO GROWTH

## 2018-09-24 NOTE — Telephone Encounter (Signed)
Transition Care Management Follow-up Telephone Call  Call completed with patient's son, Rodell Perna - [place] on file.     Date of discharge and from where: 09/23/2018 . [facility]  How have you been since you were released from the hospital? Please see below - questions or concerns.   Any questions or concerns? when asked how his father is doing, Rodell Perna said ' I don't know what to say."He said that his father was very happy to see him when he came to the hospital to take him home yesterday.  He explained that his father remains isolated alone in his room at home. Rodell Perna is the person who checks on him. His father has no fever and has not been coughing. No shortness of breath reported. He said that he is  concerned because his father is not eating or drinking and will not take his medications.  He has no energy.  He is concerned about his recovery from the virus if he does not start drinking/eating He has only taken some sips of water, no juice or other liquids.    Items Reviewed:  Did the pt receive and understand the discharge instructions provided? Yes, no questions about the instructions at this time.   Medications obtained and verified? He said that his father has all of the medications and the medication list. Only new medication - robitussin. No questions about the medications.   Any new allergies since your discharge? None reported  Do you have support at home? Son is his primary support   Other (ie: DME, Home Health, etc) he has a nebulizer but has not needed to use it since returning home  Functional Questionnaire: (I = Independent and D = Dependent) ADL's:independent but weak at present time.   Follow up appointments reviewed:    PCP Hospital f/u appt confirmed? televisit with Dr Joya Gaskins, 09/30/2018 @ [facility] f/u appt confirmed? None scheduled at this time.   Are transportation arrangements needed?    no  If their condition worsens, is the pt aware to  call  their PCP or go to the ED? Yes his son is aware to call the clinic with any questions/concerns or have him taken to ED/call 911 if emergency arises.   Was the patient provided with contact information for the PCP's office or ED? He has the phone number for the clinic  Was the pt encouraged to call back with questions or concerns? Yes.

## 2018-09-25 NOTE — Telephone Encounter (Addendum)
Call placed to patient's son, Bryan Sullivan to check on patient. He said that his father is " a whole lot better than yesterday."  He said he is starting to eat a little and has been drinking water and has urinated. No report of cough, fever, shortness of breath.  He remains isolated at his [phone]) brother's house and he checks on him frequently. he is there because he is able to have his own room with bathroom.   Discussed the need to continue to monitor his father's progress,and to call this clinic if he has further questions/concerns. Discussed the need to monitor for lethargy, respiratory distress and when to seek immediate medical attention.    Bryan Sullivan verbalized understanding of above.  He explained other family members  are now experiencing fevers, body/muscle aches. They have been taking tylenol which has helped with fevers. They do not have a thermometer and are basing the fever on how they feel. He wants to know if there is anything else that they should be doing since his father tested COVID + and they were all exposed to his father prior to this hospitalization.  They are trying to self isolate in their home. Informed him that the provider would be notified of his concerns.    Other family members that he was referring to are his Bryan Sullivan) wife who has no PCP nd his [phone]) mother/patient's wife Bryan Sullivan  MRN - [id number]). Ms Raul Del is Bryan Sullivan's PCP.   Dr Wynetta Emery notified of above. She recommended that the family try to self isolate within the home as much as possible.  No testing at this time. Monitor symptoms/ call the clinic with questions/concerns for further direction.   Call returned to Bryan Sullivan.  Explained Dr Durenda Age recommendations. He said that he too is experiencing the same symptoms as his wife and mother. They are trying to self isolate in the home but it is extremely difficult as they have children who are 40, 40, 36, 15 years old living with them Reviewed again when to call  the clinic and when to seek immediate medical attention. His mother has the option of telephone visit with her PCP. He also can access [email or web address] for assistance with concerns about his wife's symptoms. He was appreciative of the call.

## 2018-09-25 NOTE — Telephone Encounter (Signed)
Addendum to prior note - as per [name], his wife and the patient's wife are not experiencing any shortness of breath or cough

## 2018-09-29 NOTE — Progress Notes (Signed)
Virtual Visit via Telephone Note Note the patient was not available at this time. I elected to speak to the patient's son.   I did not charge this patient for this visit as the patient was not available.  I connected with Bryan Sullivan son on 09/30/18 at  9:30 AM EDT by telephone and verified that I am speaking with the correct person using two identifiers.   I discussed the limitations, risks, security and privacy concerns of performing an evaluation and management service by telephone and the availability of in person appointments. I  History of Present Illness: This is a telephone visit in a transition of care visit and I confirmed the son was on the phone as the patient was asleep.  This is a 73 year old Arabic male who I have seen previously for COPD and asthma overlap syndrome.  Since the last televisit in April  the patient has been readmitted after previously being admitted twice once in March and again then in April .  The patient had a chest pain admission initially and had a negative work-up for acute MI but CT scanning showed mild nonobstructive coronary disease and the patient was started on statin therapy and low-dose aspirin.  Then there was a visit in April between the fourth and seventh of the month.  The patient had a COPD exacerbation and was given corticosteroids and antibiotics along with nebulized therapy.  Pneumonia was ruled out.  COVID was negative as well as respiratory viral panel.  Blood cultures were negative.  The patient responded to azithromycin and corticosteroids.  The patient was given an outpatient course of azithromycin along with oral prednisone for an additional 3 days   I then saw the patient via televisit afterdischarge on 4/15.   Then the pt was readmitted on 4/23, this time COVID was POSITIVE.  See below excerpts from the  discharge summary and the transition of care note from our nurse case manager who contacted the patient shortly after  discharge  Note also now multiple family members likely positive COVID like symptoms    Date of Admission: 09/18/2018  5:52 AM Date of Discharge:  09/23/18  Attending Physician: Lenice Pressman, MD, PhD  Discharge Diagnosis: 1.  COPD exacerbation due to COVID-19 infection 2.  Acute kidney injury on CKD stage II  Disposition and follow-up:   [name] was discharged from [facility] in Good condition.  At the hospital follow up visit please address:  1.  COVID-19 infection: Please follow-up his symptoms.  He had relatively mild symptoms of COVID-19 and a COPD exacerbation, but had a prolonged hospital stay due to the risk of infecting his family at home.  2. Acute kidney injury: He had an increase in his creatinine during his hospitalization, likely related to volume depletion and poor p.o. intake.  It had not improved significantly by the time of discharge, despite receiving some IV fluid boluses.  Please recheck his renal function and electrolytes to ensure they have returned to normal.  3.  Labs / imaging needed at time of follow-up: BMP  4.  Pending labs/ test needing follow-up: Urine culture  Hospital Course by problem list: 1.  COVID-19 infection: [name] is a 73 year old man with history of COPD, stable CAD, and HTN who was admitted with fever, shortness of breath, and cough.  He tested positive for SARS-CoV-2.  CRP was mildly elevated at 1.7 but procalcitonin was negative, and he had mild leukopenia of 2.4 and lymphopenia of 0.3, all  consistent with COVID-19.  He was initially treated with broad-spectrum antibiotics in the ED, but this was narrowed to azithromycin for his COPD exacerbation.  He had mild hypoxia initially that responded to O2 2 L by nasal cannula, and this was weaned quickly.  His fever curve also improved quickly.  Given his overall mild symptoms, he was never treated with any of our experimental therapies for  COVID-19.  As is reported with many COVID-19 cases, his d-dimer was elevated at 0.6, increased to 4.6, then decreased to undetectable the day before discharge.  He was kept on DVT prophylaxis throughout his stay and did not have any overt signs of clotting requiring increased dosage.  Our team had extensive discussions with his family to help them arrange for safe quarantine at home.  He lives with his wife, his son, and his son's family.  It took a number of days for them to have a separate room available for him and they are all aware that he should remain in self quarantine for at least 14 more days.  2.  Acute kidney injury on CKD stage II: Baseline creatinine appears to be approximately 1.1-1.2.  On admission, creatinine was 1.3 and rose to 1.9.  This was likely related to his viral infection and poor p.o. intake.  He received IV fluids, but his renal function had not returned to normal at the time of discharge.  Creatinine was 1.9 on the day before discharge.  Please recheck his creatinine in follow-up to ensure his renal function returns to baseline.  3.  Dysuria: On the day of discharge he noted burning with urination.  Urinalysis was completely normal with no leukocyte esterase or nitrites.  Urine culture was collected and is pending at the time of discharge.  Given his completely normal urinalysis, he was not started on antibiotics.  However, should he have ongoing symptoms of dysuria, would recommend rechecking urinalysis and urine culture and considering antibiotics.  He does have a history of BPH and may be having lower urinary tract symptoms related to that.  TOC f/u per CM: Note    Addendum to prior note - as per [name], his wife and the patient's wife are not experiencing any shortness of breath or cough             Call placed to patient's son, Bryan Sullivan to check on patient. He said that his father is " a whole lot better than yesterday."  He said he is starting to eat a  little and has been drinking water and has urinated. No report of cough, fever, shortness of breath.  He remains isolated at his [phone] brother's house and he checks on him frequently. he is there because he is able to have his own room with bathroom.   Discussed the need to continue to monitor his father's progress,and to call this clinic if he has further questions/concerns. Discussed the need to monitor for lethargy, respiratory distress and when to seek immediate medical attention.    Bryan Sullivan verbalized understanding of above.  He explained other family members  are now experiencing fevers, body/muscle aches. They have been taking tylenol which has helped with fevers. They do not have a thermometer and are basing the fever on how they feel. He wants to know if there is anything else that they should be doing since his father tested COVID + and they were all exposed to his father prior to this hospitalization.  They are trying to self isolate in  their home. Informed him that the provider would be notified of his concerns.    Other family members that he was referring to are his Rennis Golden) wife who has no PCP nd his [phone]) mother/patient's wife Flavia Shipper  MRN - [id number]). Ms Raul Del is [place]'s PCP.   Dr Wynetta Emery notified of above. She recommended that the family try to self isolate within the home as much as possible.  No testing at this time. Monitor symptoms/ call the clinic with questions/concerns for further direction.   Call returned to [place].  Explained Dr Durenda Age recommendations. He said that he too is experiencing the same symptoms as his wife and mother. They are trying to self isolate in the home but it is extremely difficult as they have children who are 70, 89, 74, 68 years old living with them Reviewed again when to call the clinic and when to seek immediate medical attention. His mother has the option of telephone visit with her PCP. He also can access [email or web address] for  assistance with concerns about his wife's symptoms. He was appreciative of the call.      September 24, 2018       Transition Care Management Follow-up Telephone Call  Call completed with patient's son, Bryan Sullivan - [place] on file.     Date of discharge and from where: 09/23/2018 . [facility]  How have you been since you were released from the hospital? Please see below - questions or concerns.   Any questions or concerns? when asked how his father is doing, Bryan Sullivan said ' I don't know what to say."He said that his father was very happy to see him when he came to the hospital to take him home yesterday.  He explained that his father remains isolated alone in his room at home. Bryan Sullivan is the person who checks on him. His father has no fever and has not been coughing. No shortness of breath reported. He said that he is  concerned because his father is not eating or drinking and will not take his medications.  He has no energy.  He is concerned about his recovery from the virus if he does not start drinking/eating He has only taken some sips of water, no juice or other liquids.    Items Reviewed:  Did the pt receive and understand the discharge instructions provided? Yes, no questions about the instructions at this time.   Medications obtained and verified? He said that his father has all of the medications and the medication list. Only new medication - robitussin. No questions about the medications.   Any new allergies since your discharge? None reported  Do you have support at home? Son is his primary support   Other (ie: DME, Home Health, etc) he has a nebulizer but has not needed to use it since returning home  Functional Questionnaire: (I = Independent and D = Dependent) ADL's:independent but weak at present time.   Follow up appointments reviewed:    PCP Hospital f/u appt confirmed? televisit with Dr Joya Gaskins, 09/30/2018 @ [facility] f/u appt confirmed? None  scheduled at this time.   Are transportation arrangements needed?    no  If their condition worsens, is the pt aware to call  their PCP or go to the ED? Yes his son is aware to call the clinic with any questions/concerns or have him taken to ED/call 911 if emergency arises.   Was the patient provided with contact information for the PCP's  office or ED? He has the phone number for the clinic  Was the pt encouraged to call back with questions or concerns? Yes.      According to the patient's son cough is improving with his father.  He has some slight body aches.  The urine output is improved.  His appetite is better.  He did have a lot of nausea and vomiting but it is now improved.  He had some diarrhea now improving.  The patient's blood pressures been in the 137/70 range.  Again the patient was not available for this phone visit and so this is going to be a check-in visit with the son alone.   Observations/Objective: No observations as this was a telephone visit  Assessment and Plan: #1 chronic obstructive lung disease with asthma overlap syndrome and recent exacerbation now due to COVID: According to the patient's son the patient is improving and I asked for the patient to be enrolled in the MyChart screening for follow-up symptoms and the son said he would agree to this using his smart phone  The patient will maintain his inhaled medications as prescribed.  There is no additional antibiotics needed.  The hospital wished for labs to be drawn for a basic metabolic panel however because the patient is COVID positive we cannot perform this in our office I will check to see if this can be done through another method  I will schedule a follow-up telephone visit with the patient and the son in the next week  Follow Up Instructions: The son notes a follow-up visit will be scheduled and he will be receiving a my chart instruction on how to enroll his father in this monitoring program for ongoing  COVID symptoms  The son knows the patient needs to maintain quarantine at this time I did renew all the patient's medications for multiple refills   I provided 15 minutes of non-face-to-face time during this encounter.   Asencion Noble, MD

## 2018-09-30 ENCOUNTER — Ambulatory Visit: Payer: Medicare Other | Attending: Critical Care Medicine | Admitting: Critical Care Medicine

## 2018-09-30 ENCOUNTER — Encounter: Payer: Self-pay | Admitting: Critical Care Medicine

## 2018-09-30 ENCOUNTER — Ambulatory Visit: Payer: Medicare Other | Admitting: Critical Care Medicine

## 2018-09-30 ENCOUNTER — Other Ambulatory Visit: Payer: Self-pay

## 2018-09-30 DIAGNOSIS — K219 Gastro-esophageal reflux disease without esophagitis: Secondary | ICD-10-CM

## 2018-09-30 DIAGNOSIS — I10 Essential (primary) hypertension: Secondary | ICD-10-CM | POA: Diagnosis not present

## 2018-09-30 DIAGNOSIS — J441 Chronic obstructive pulmonary disease with (acute) exacerbation: Secondary | ICD-10-CM | POA: Diagnosis not present

## 2018-09-30 DIAGNOSIS — U071 COVID-19: Secondary | ICD-10-CM | POA: Diagnosis not present

## 2018-09-30 MED ORDER — LOSARTAN POTASSIUM 100 MG PO TABS: 100.0000 mg | ORAL_TABLET | Freq: Every day | ORAL | 0 refills | Status: DC

## 2018-09-30 MED ORDER — FLUTICASONE-SALMETEROL 250-50 MCG/DOSE IN AEPB: 1.0000 | INHALATION_SPRAY | Freq: Two times a day (BID) | RESPIRATORY_TRACT | 3 refills | Status: DC

## 2018-09-30 MED ORDER — UMECLIDINIUM BROMIDE 62.5 MCG/INH IN AEPB: 1.0000 | INHALATION_SPRAY | Freq: Every day | RESPIRATORY_TRACT | 3 refills | Status: DC

## 2018-09-30 MED ORDER — TIOTROPIUM BROMIDE MONOHYDRATE 18 MCG IN CAPS: 18.0000 ug | ORAL_CAPSULE | Freq: Every day | RESPIRATORY_TRACT | 3 refills | Status: DC

## 2018-09-30 MED ORDER — OMEPRAZOLE 20 MG PO CPDR: 20.0000 mg | DELAYED_RELEASE_CAPSULE | Freq: Every day | ORAL | 1 refills | Status: DC

## 2018-09-30 MED ORDER — ALBUTEROL SULFATE (2.5 MG/3ML) 0.083% IN NEBU: INHALATION_SOLUTION | RESPIRATORY_TRACT | 1 refills | Status: DC

## 2018-09-30 MED FILL — !INCRUSE ELLIPTA 62.5 MCG I: 62.5 | 30 days supply | Qty: 30 | Fill #0

## 2018-09-30 MED FILL — !ADVAIR 250/50 DISKUS: 250-50 | 30 days supply | Qty: 60 | Fill #0

## 2018-09-30 MED FILL — ?OMEPRAZOLE 20 MG CAPSULE D: 20 | 30 days supply | Qty: 30 | Fill #0

## 2018-09-30 MED FILL — ALBUTEROL SUL 2.5 MG/3 ML S: (2.5 MG/3ML | 14 days supply | Qty: 180 | Fill #0

## 2018-09-30 NOTE — Addendum Note (Signed)
Addended by: Asencion Noble E on: 09/30/2018 11:51 AM   Modules accepted: Orders

## 2018-09-30 NOTE — Progress Notes (Addendum)
Virtual Visit via Telephone Note Note the patient was not available at this time. I elected to speak to the patient's son.   I will charge a telephone visit   I connected withAbdelgafour Sullivan son on 09/30/18 at  9:30 AM EDT by telephoneand verified that I am speaking with the correct person using two identifiers.  Consent I discussed the limitations, risks, security and privacy concerns of performing an evaluation and management service by telephone and the availability of in person appointments.   Pt son at pt home I was in my office    History of Present Illness: This is a telephone visit in a transition of care visit and I confirmed the son was on the phone as the patient was asleep.  This is a 73 year old Arabic male who I have seen previously for COPD and asthma overlap syndrome.  Since the last televisit in April  the patient has been readmitted after previously being admitted twice once in March and again then in April .  The patient had a chest pain admission initially and had a negative work-up for acute MI but CT scanning showed mild nonobstructive coronary disease and the patient was started on statin therapy and low-dose aspirin.  Then there was a visit in April between the fourth and seventh of the month.  The patient had a COPD exacerbation and was given corticosteroids and antibiotics along with nebulized therapy.  Pneumonia was ruled out.  COVID was negative as well as respiratory viral panel.  Blood cultures were negative.  The patient responded to azithromycin and corticosteroids.  The patient was given an outpatient course of azithromycin along with oral prednisone for an additional 3 days   I then saw the patient via televisit afterdischarge on 4/15.   Then the pt was readmitted on 4/23, this time COVID was POSITIVE.  See below excerpts from the  discharge summary and the transition of care note from our nurse case manager who contacted the patient shortly after  discharge  Note also now multiple family members likely positive COVID like symptoms    Date of Admission:09/18/2018 5:52 AM Date of Discharge:09/23/18 Attending Physician: Lenice Pressman, MD, PhD  Discharge Diagnosis: 1.COPD exacerbation due to COVID-19 infection 2. Acute kidney injury on CKD stage II  Disposition and follow-up: Bryan [facility]amedwas [facility] in [place]. At the hospital follow up visit please address:  1.COVID-19 infection: Please follow-up his symptoms. He had relatively mild symptoms of COVID-19 and a COPD exacerbation, but had a prolonged hospital stay due to the risk of infecting his family at home.  2.Acute kidney injury: He had an increase in his creatinine during his hospitalization, likely related to volume depletion and poor p.o. intake. It had not improved significantly by the time of discharge, despite receiving some IV fluid boluses. Please recheck his renal function and electrolytes to ensure they have returned to normal.  3. Labs / imaging needed at time of follow-up:BMP  4. Pending labs/ test needing follow-up: Urine culture  Hospital Course by problem list: 1.COVID-19 infection: Bryan Sullivan is a 73 year old man with history of COPD, stable CAD, and HTN who was admitted with fever, shortness of breath, and cough. He tested positive for SARS-CoV-2. CRP was mildly elevated at 1.7 but procalcitonin was negative, and he had mild leukopenia of 2.4 and lymphopenia of 0.3, all consistent with COVID-19. He was initially treated with broad-spectrum antibiotics in the ED, but this was narrowed to azithromycin for his COPD exacerbation.  He had mild hypoxia initially that responded to O2 2 L by nasal cannula, and this was weaned quickly. His fever curve also improved quickly. Given his overall mild symptoms, he was never treated with any of our experimental therapies for  COVID-19.  As is reported with many COVID-19 cases, his d-dimer was elevated at 0.6, increased to 4.6, then decreased to undetectable the day before discharge. He was kept on DVT prophylaxis throughout his stay and did not have any overt signs of clotting requiring increased dosage.  Our team had extensive discussions with his family to help them arrange for safe quarantine at home. He lives with his wife, his son, and his son's family. It took a number of days for them to have a separate room available for him and they are all aware that he should remain in self quarantine for at least 14 more days.  2. Acute kidney injury on CKD stage II: Baseline creatinine appears to be approximately 1.1-1.2. On admission, creatinine was 1.3 and rose to 1.9. This was likely related to his viral infection and poor p.o. intake. He received IV fluids, but his renal function had not returned to normal at the time of discharge. Creatinine was 1.9 on the day before discharge. Please recheck his creatinine in follow-up to ensure his renal function returns to baseline.  3. Dysuria: On the day of discharge he noted burning with urination. Urinalysis was completely normal with no leukocyte esterase or nitrites. Urine culture was collected and is pending at the time of discharge. Given his completely normal urinalysis, he was not started on antibiotics. However, should he have ongoing symptoms of dysuria, would recommend rechecking urinalysis and urine culture and considering antibiotics. He does have a history of BPH and may be having lower urinary tract symptoms related to that.  TOC f/u per CM: Note    Addendum to prior note - as per [name], his wife and the patient's wife are not experiencing any shortness of breath or cough             Call placed to patient's son, Bryan Sullivan to check on patient. He said that his father is " a whole lot better than yesterday." He said he is starting to eat a  little and has been drinking water and has urinated. No report of cough, fever, shortness of breath. He remains isolated at his [phone]) brother's house and he checks on him frequently. he is there because he is able to have his own room with bathroom. Discussed the need to continue to monitor his father's progress,and to call this clinic if he has further questions/concerns. Discussed the need to monitor for lethargy, respiratory distressand when to seek immediate medical attention.   Bryan Sullivan verbalized understanding of above. He explained other family membersare now experiencing fevers, body/muscle aches. They have been taking tylenol which has helped with fevers. They do not have a thermometer and are basing the fever on how they feel. He wants to know if there is anything else that they should be doing since his father tested COVID + and they were all exposed to his father prior to this hospitalization. They are trying to self isolate in their home. Informed him that the provider would be notified of his concerns.  Other family members that he was referring to are his Rennis Golden) wife who has no PCP nd his [phone]) mother/patient's wife Flavia Shipper MRN - [id number]). Ms Raul Del is [place]ria's PCP.   Dr Wynetta Emery notified  of above. She recommended that the family try to self isolate within the home as much as possible. No testing at this time. Monitor symptoms/ call the clinic with questions/concerns for further direction.   Call returned to [place]. Explained Dr Durenda Age recommendations. He said that he too is experiencing the same symptoms as his wife and mother. They are trying to self isolate in the home but it is extremely difficult as they have children who are 37, 82, 34, 27 years old living with them Reviewed again when to call the clinic and when to seek immediate medical attention. His mother has the option of telephone visit with her PCP. He also can access [email or web address] for  assistance with concerns about his wife's symptoms. He was appreciative of the call.      September 24, 2018       Transition Care Management Follow-up Telephone Call  Call completed with patient's son, Bryan Sullivan - [place] on file.    Date of discharge and from where:09/23/2018 . [facility]  How have you been since you were released from the hospital?Please see below - questions or concerns.  Any questions or concerns?when asked how his father is doing, [place] ' I don't know what to say."He said that his father was very happy to see him when he came to the hospital to take him home yesterday. He explained that his father remains isolated alone in his room at home. Bryan Sullivan is the person who checks on him. His father has no fever and has not been coughing. No shortness of breath reported. He said that he is concerned because his father is not eating or drinking and will not take his medications. He has no energy. He is concerned about his recovery from the virus if he does not start drinking/eating He has only taken some sips of water, no juice or other liquids.    Items Reviewed:  Did the pt receive and understand the discharge instructions provided?Yes, no questions about the instructions at this time.  Medications obtained and verified?He said that his father has all of the medications and the medication list. Only new medication - robitussin. No questions about the medications.  Any new allergies since your discharge?None reported  Do you have support at home?Son is his primary support  Other (ie: DME, Home Health, etc)he has a nebulizer but has not needed to use it since returning home  Functional Questionnaire: (I = Independent and D = Dependent) ADL's:independent but weak at present time.  Follow up appointments reviewed:   PCP Hospital f/u appt confirmed?televisit with Dr Joya Gaskins, 09/30/2018 @ [facility] f/u appt confirmed?None  scheduled at this time.  Are transportation arrangements needed?no  If their condition worsens, is the pt aware to call their PCP or go to the ED?Yes his son is aware to call the clinic with any questions/concerns or have him taken to ED/call 911 if emergency arises.  Was the patient provided with contact information for the PCP's office or ED?He has the phone number for the clinic  Was the pt encouraged to call back with questions or concerns?Yes.      According to the patient's son cough is improving with his father.  He has some slight body aches.  The urine output is improved.  His appetite is better.  He did have a lot of nausea and vomiting but it is now improved.  He had some diarrhea now improving.  The patient's blood pressures been in the 137/70  range.  Again the patient was not available for this phone visit and so this is going to be a check-in visit with the son alone.  Observations/Objective: No observations as this was a telephone visit  Assessment and Plan: #1 chronic obstructive lung disease with asthma overlap syndrome and recent exacerbation now due to COVID: According to the patient's son the patient is improving and I asked for the patient to be enrolled in the MyChart screening for follow-up symptoms and the son said he would agree to this using his smart phone  The patient will maintain his inhaled medications as prescribed.  Note the hospital started spriva and we will change to Incruse for patient assistance.   There is no additional antibiotics needed.  The hospital wished for labs to be drawn for a basic metabolic panel however because the patient is COVID positive we cannot perform this in our office I will check to see if this can be done through another method  I will schedule a follow-up telephone visit with the patient and the son in the next week  Follow Up Instructions: The son notes a follow-up visit will be scheduled and he will be  receiving a my chart instruction on how to enroll his father in this monitoring program for ongoing COVID symptoms  The son knows the patient needs to maintain quarantine at this time I did renew all the patient's medications for multiple refills  I provided 25 minutes of non-face-to-face time during this encounter  including  median intraservice time , review of notes, labs, imaging, medications  and explaining diagnosis and management to the patient's son.   Asencion Noble, MD

## 2018-10-01 ENCOUNTER — Telehealth: Payer: Self-pay

## 2018-10-01 ENCOUNTER — Other Ambulatory Visit: Payer: Self-pay | Admitting: Nurse Practitioner

## 2018-10-01 DIAGNOSIS — N401 Enlarged prostate with lower urinary tract symptoms: Secondary | ICD-10-CM

## 2018-10-01 DIAGNOSIS — R3914 Feeling of incomplete bladder emptying: Principal | ICD-10-CM

## 2018-10-01 MED FILL — FINASTERIDE 5 MG TABLET: 5 | 30 days supply | Qty: 30 | Fill #2

## 2018-10-01 MED FILL — !PROVENTIL HFA 90 MCG INH: 108 (90 BAS | 25 days supply | Qty: 1 | Fill #1

## 2018-10-01 MED FILL — TAMSULOSIN HCL 0.4 MG CAP: 0.4 | 30 days supply | Qty: 30 | Fill #0

## 2018-10-01 MED FILL — FLUOROMETHOLONE 0.1% DROPS: 0.1 | 74 days supply | Qty: 10 | Fill #1

## 2018-10-01 MED FILL — DULERA 200 MCG/5 MCG INH: 200-5 | 15 days supply | Qty: 13 | Fill #1

## 2018-10-01 MED FILL — TIMOLOL 0.5% EYE DROPS: 0.5 | 37 days supply | Qty: 5 | Fill #1

## 2018-10-01 MED FILL — SPIRIVA HANDIHALER 18MCG: 30 days supply | Qty: 30 | Fill #0

## 2018-10-01 MED FILL — LATANOPROST 0.005% EYE DRP: 0.005 | 18 days supply | Qty: 3 | Fill #1

## 2018-10-01 MED FILL — ATORVASTATIN CALCIUM 40 MG: 40 | 30 days supply | Qty: 30 | Fill #1

## 2018-10-01 MED FILL — DORZOLAMIDE HCL 2% EYE DRP: 2 | 50 days supply | Qty: 10 | Fill #1

## 2018-10-01 NOTE — Telephone Encounter (Signed)
Called patient's son to follow up on the status of him signing up for MyChart. He states that he has not signed up for MyChart yet & asked me to resend the activation link.

## 2018-10-06 NOTE — Progress Notes (Signed)
Virtual Visit via Telephone Note  I connected withAbdelgafour Sullivan son on 10/07/18 at 230 pm EDT by telephoneand verified that I am speaking with the correct person using two identifiers.  Consent I discussed the limitations, risks, security and privacy concerns of performing an evaluation and management service by telephone and the availability of in person appointments.   The patient and son were at home  I was in my office  No one else was on the telephone  History of Present Illness: This is a telephone visit with the patient and the son who acted as an interpreter with regards to the patient's COVID infection and COPD exacerbation.  This is a 73 year old Arabic male who previously had been seen for COPD and asthma.  Since the last telemetry visit a week ago the patient has rapidly improved from the April admission for COVID infection.  The patient states his shortness of breath is at baseline.  There is no cough or fever.  He is not been on a antipyretics in 3 days.  It has been over 10 days since his initial infection onset and his symptoms have been rapidly improving as well during this time.  The patient has no diarrhea.  There is no body aches.  The patient is taking in fluids satisfactory.  There is good urine output.  There is no headaches or dizziness.  There is no taste or's loss of smell deficits.  There is no signs of dehydration per the son.  There is no dryness in the skin.  Urine output is been adequate.  There has been increased symptoms of increased prostate size and lack of completely emptying his bladder.  The patient is on the Proscar and tamsulosin.  Patient is yet to see urology for these complaints.  Patient is maintaining the Incruse inhaler and Advair.  He was having these complaints since Incruse was started and the Incruse does not worsen these complaints from bladder emptying.  Pos in BOLD Constitutional:   No  weight loss, night sweats,  Fevers,  chills, fatigue, lassitude. HEENT:   No headaches,  Difficulty swallowing,  Tooth/dental problems,  Sore throat,                No sneezing, itching, ear ache, nasal congestion, post nasal drip,   CV:  No chest pain,  Orthopnea, PND, swelling in lower extremities, anasarca, dizziness, palpitations  GI  No heartburn, indigestion, abdominal pain, nausea, vomiting, diarrhea, change in bowel habits, loss of appetite  Resp: No shortness of breath with exertion or at rest.  No excess mucus, no productive cough,  No non-productive cough,  No coughing up of blood.  No change in color of mucus.  No wheezing.  No chest wall deformity  Skin: no rash or lesions.  GU: no dysuria, change in color of urine,  urgency or frequency.  No flank pain.  MS:  No joint pain or swelling.  No decreased range of motion.  No back pain.  Psych:  No change in mood or affect. No depression or anxiety.  No memory loss.  Observations/Objective: No observations as this was a telephone visit  Assessment and Plan: #1 chronic obstructive lung disease with asthma overlap syndrome and recent exacerbation now due to COVID: The patient has rapidly improved and is now symptom-free from [place] at this time  The patient will maintain his inhaled medications as prescribed.    We will maintain Incruse and Advair for now however if patient's prostatism  persist we may need to consider discontinuance of the Incruse    there is no additional antibiotics needed.    #2 benign prostatic hypertrophy: The patient is on Proscar and will also now increase tamsulosin to 0.8 mg daily and issue a prescription refill will also plan a referral to urology later in the summer  Follow Up Instructions: The patient knows that an in office visit will be made end of June and the tamsulosin dose will be increased to 0.8 mg daily and this will be mailed to the home   I provided 15 minutes of non-face-to-face time during this encounter  including   median intraservice time , review of notes, labs, imaging, medications  and explaining diagnosis and management to the patient's son.   Asencion Noble, MD

## 2018-10-07 ENCOUNTER — Ambulatory Visit: Payer: Medicare Other | Attending: Critical Care Medicine | Admitting: Critical Care Medicine

## 2018-10-07 ENCOUNTER — Encounter: Payer: Self-pay | Admitting: Critical Care Medicine

## 2018-10-07 ENCOUNTER — Other Ambulatory Visit: Payer: Self-pay

## 2018-10-07 DIAGNOSIS — J441 Chronic obstructive pulmonary disease with (acute) exacerbation: Secondary | ICD-10-CM | POA: Diagnosis not present

## 2018-10-07 DIAGNOSIS — R3914 Feeling of incomplete bladder emptying: Secondary | ICD-10-CM

## 2018-10-07 DIAGNOSIS — J988 Other specified respiratory disorders: Secondary | ICD-10-CM

## 2018-10-07 DIAGNOSIS — U071 COVID-19: Secondary | ICD-10-CM | POA: Diagnosis not present

## 2018-10-07 DIAGNOSIS — N401 Enlarged prostate with lower urinary tract symptoms: Secondary | ICD-10-CM

## 2018-10-07 MED ORDER — TAMSULOSIN HCL 0.4 MG PO CAPS: 0.8000 mg | ORAL_CAPSULE | Freq: Every day | ORAL | 3 refills | Status: DC

## 2018-10-08 ENCOUNTER — Telehealth (INDEPENDENT_AMBULATORY_CARE_PROVIDER_SITE_OTHER): Payer: Self-pay | Admitting: Nurse Practitioner

## 2018-10-08 NOTE — Telephone Encounter (Signed)
Did not call pt - pt has had 2 f/u with Dr. Joya Gaskins regarding COVID19 symptoms.

## 2018-10-23 ENCOUNTER — Telehealth: Payer: Self-pay | Admitting: *Deleted

## 2018-10-23 NOTE — Telephone Encounter (Signed)
I called pt and asked if he would be willing to donate his plasma to assist in the recovery of others with the COVID-19.   I explained how his antibodies from where he had the virus could be used to help others.   He responded,  "I need to check with my doctor first because I have many health conditions including a weak heart".   I gave him the web site [employer] with instructions on what to do as far as filling out the application if his doctor gave the ok.  I thanked him for considering donating depending on what his doctor tells him.

## 2018-10-31 ENCOUNTER — Telehealth: Payer: Self-pay | Admitting: Nurse Practitioner

## 2018-10-31 ENCOUNTER — Other Ambulatory Visit: Payer: Self-pay | Admitting: Internal Medicine

## 2018-10-31 MED FILL — LOSARTAN POTASSIUM 100 MG T: 100 | 30 days supply | Qty: 30 | Fill #0

## 2018-10-31 MED FILL — ?OMEPRAZOLE 20 MG CAPSULE D: 20 | 30 days supply | Qty: 30 | Fill #1

## 2018-10-31 MED FILL — TAMSULOSIN HCL 0.4 MG CAP: 0.4 | 30 days supply | Qty: 30 | Fill #1

## 2018-10-31 MED FILL — LATANOPROST 0.005% EYE DRP: 0.005 | 18 days supply | Qty: 3 | Fill #2

## 2018-10-31 MED FILL — ALBUTEROL SUL 2.5 MG/3 ML S: (2.5 MG/3ML | 14 days supply | Qty: 180 | Fill #1

## 2018-10-31 MED FILL — TIMOLOL 0.5% EYE DROPS: 0.5 | 100 days supply | Qty: 10 | Fill #2

## 2018-10-31 MED FILL — ?AMLODIPINE BESYLATE 5MG TA: 5 | 90 days supply | Qty: 90 | Fill #1

## 2018-10-31 MED FILL — ?ATORVASTATIN 40MG TABLET: 40 | 30 days supply | Qty: 30 | Fill #2

## 2018-10-31 MED FILL — SPIRIVA HANDIHALER 18MCG: 30 days supply | Qty: 30 | Fill #1

## 2018-10-31 NOTE — Telephone Encounter (Signed)
Patient son came to pickup a copy of the CAFA letter however, it is not in the system. Please follow up.

## 2018-11-03 MED FILL — FINASTERIDE 5 MG TABLET: 5 | 30 days supply | Qty: 30 | Fill #3

## 2018-11-06 ENCOUNTER — Other Ambulatory Visit: Payer: Self-pay

## 2018-11-06 ENCOUNTER — Ambulatory Visit: Payer: Medicare Other | Admitting: Primary Care

## 2018-11-07 ENCOUNTER — Ambulatory Visit: Payer: Medicare Other | Admitting: Internal Medicine

## 2018-11-10 DIAGNOSIS — Z0001 Encounter for general adult medical examination with abnormal findings: Secondary | ICD-10-CM | POA: Diagnosis not present

## 2018-11-10 DIAGNOSIS — Z125 Encounter for screening for malignant neoplasm of prostate: Secondary | ICD-10-CM | POA: Diagnosis not present

## 2018-11-17 NOTE — Progress Notes (Deleted)
Virtual Visit via Telephone Note  I connected withAbdelgafour Sullivan son on 10/07/18 at 230 pm EDT by telephoneand verified that I am speaking with the correct person using two identifiers.  Consent I discussed the limitations, risks, security and privacy concerns of performing an evaluation and management service by telephone and the availability of in person appointments.   The patient and son were at home  I was in my office  No one else was on the telephone  History of Present Illness: This is a telephone visit with the patient and the son who acted as an interpreter with regards to the patient's COVID infection and COPD exacerbation.  This is a 73 year old Arabic male who previously had been seen for COPD and asthma.  Since the last telemetry visit a week ago the patient has rapidly improved from the April admission for COVID infection.  The patient states his shortness of breath is at baseline.  There is no cough or fever.  He is not been on a antipyretics in 3 days.  It has been over 10 days since his initial infection onset and his symptoms have been rapidly improving as well during this time.  The patient has no diarrhea.  There is no body aches.  The patient is taking in fluids satisfactory.  There is good urine output.  There is no headaches or dizziness.  There is no taste or's loss of smell deficits.  There is no signs of dehydration per the son.  There is no dryness in the skin.  Urine output is been adequate.  There has been increased symptoms of increased prostate size and lack of completely emptying his bladder.  The patient is on the Proscar and tamsulosin.  Patient is yet to see urology for these complaints.  Patient is maintaining the Incruse inhaler and Advair.  He was having these complaints since Incruse was started and the Incruse does not worsen these complaints from bladder emptying.  Pos in BOLD Constitutional:   No  weight loss, night sweats,  Fevers,  chills, fatigue, lassitude. HEENT:   No headaches,  Difficulty swallowing,  Tooth/dental problems,  Sore throat,                No sneezing, itching, ear ache, nasal congestion, post nasal drip,   CV:  No chest pain,  Orthopnea, PND, swelling in lower extremities, anasarca, dizziness, palpitations  GI  No heartburn, indigestion, abdominal pain, nausea, vomiting, diarrhea, change in bowel habits, loss of appetite  Resp: No shortness of breath with exertion or at rest.  No excess mucus, no productive cough,  No non-productive cough,  No coughing up of blood.  No change in color of mucus.  No wheezing.  No chest wall deformity  Skin: no rash or lesions.  GU: no dysuria, change in color of urine,  urgency or frequency.  No flank pain.  MS:  No joint pain or swelling.  No decreased range of motion.  No back pain.  Psych:  No change in mood or affect. No depression or anxiety.  No memory loss.  Observations/Objective: No observations as this was a telephone visit  Assessment and Plan: #1 chronic obstructive lung disease with asthma overlap syndrome and recent exacerbation now due to COVID: The patient has rapidly improved and is now symptom-free from [place] at this time  The patient will maintain his inhaled medications as prescribed.    We will maintain Incruse and Advair for now however if patient's prostatism  persist we may need to consider discontinuance of the Incruse    there is no additional antibiotics needed.    #2 benign prostatic hypertrophy: The patient is on Proscar and will also now increase tamsulosin to 0.8 mg daily and issue a prescription refill will also plan a referral to urology later in the summer  Follow Up Instructions: The patient knows that an in office visit will be made end of June and the tamsulosin dose will be increased to 0.8 mg daily and this will be mailed to the home   I provided 15 minutes of non-face-to-face time during this encounter  including   median intraservice time , review of notes, labs, imaging, medications  and explaining diagnosis and management to the patient's son.   Bryan Noble, MD

## 2018-11-18 ENCOUNTER — Other Ambulatory Visit: Payer: Self-pay

## 2018-11-18 ENCOUNTER — Encounter: Payer: Self-pay | Admitting: Critical Care Medicine

## 2018-11-18 ENCOUNTER — Ambulatory Visit: Payer: Medicare Other | Attending: Critical Care Medicine | Admitting: Critical Care Medicine

## 2018-11-18 VITALS — BP 120/71 | HR 90 | Temp 98.2°F | Ht 70.0 in | Wt 186.6 lb

## 2018-11-18 DIAGNOSIS — Z833 Family history of diabetes mellitus: Secondary | ICD-10-CM | POA: Insufficient documentation

## 2018-11-18 DIAGNOSIS — Z8249 Family history of ischemic heart disease and other diseases of the circulatory system: Secondary | ICD-10-CM | POA: Diagnosis not present

## 2018-11-18 DIAGNOSIS — H401113 Primary open-angle glaucoma, right eye, severe stage: Secondary | ICD-10-CM | POA: Diagnosis not present

## 2018-11-18 DIAGNOSIS — N448 Other noninflammatory disorders of the testis: Secondary | ICD-10-CM | POA: Insufficient documentation

## 2018-11-18 DIAGNOSIS — K219 Gastro-esophageal reflux disease without esophagitis: Secondary | ICD-10-CM | POA: Insufficient documentation

## 2018-11-18 DIAGNOSIS — Z825 Family history of asthma and other chronic lower respiratory diseases: Secondary | ICD-10-CM | POA: Insufficient documentation

## 2018-11-18 DIAGNOSIS — I10 Essential (primary) hypertension: Secondary | ICD-10-CM

## 2018-11-18 DIAGNOSIS — J441 Chronic obstructive pulmonary disease with (acute) exacerbation: Secondary | ICD-10-CM | POA: Diagnosis not present

## 2018-11-18 DIAGNOSIS — N170 Acute kidney failure with tubular necrosis: Secondary | ICD-10-CM | POA: Insufficient documentation

## 2018-11-18 DIAGNOSIS — Z87891 Personal history of nicotine dependence: Secondary | ICD-10-CM | POA: Insufficient documentation

## 2018-11-18 DIAGNOSIS — N182 Chronic kidney disease, stage 2 (mild): Secondary | ICD-10-CM | POA: Diagnosis not present

## 2018-11-18 DIAGNOSIS — I4510 Unspecified right bundle-branch block: Secondary | ICD-10-CM | POA: Insufficient documentation

## 2018-11-18 DIAGNOSIS — R3914 Feeling of incomplete bladder emptying: Secondary | ICD-10-CM

## 2018-11-18 DIAGNOSIS — N401 Enlarged prostate with lower urinary tract symptoms: Secondary | ICD-10-CM | POA: Diagnosis not present

## 2018-11-18 DIAGNOSIS — U071 COVID-19: Secondary | ICD-10-CM | POA: Diagnosis not present

## 2018-11-18 DIAGNOSIS — I129 Hypertensive chronic kidney disease with stage 1 through stage 4 chronic kidney disease, or unspecified chronic kidney disease: Secondary | ICD-10-CM | POA: Insufficient documentation

## 2018-11-18 DIAGNOSIS — N400 Enlarged prostate without lower urinary tract symptoms: Secondary | ICD-10-CM | POA: Diagnosis not present

## 2018-11-18 DIAGNOSIS — R300 Dysuria: Secondary | ICD-10-CM | POA: Diagnosis not present

## 2018-11-18 DIAGNOSIS — N50811 Right testicular pain: Secondary | ICD-10-CM | POA: Diagnosis not present

## 2018-11-18 DIAGNOSIS — R079 Chest pain, unspecified: Secondary | ICD-10-CM | POA: Diagnosis not present

## 2018-11-18 DIAGNOSIS — Z7982 Long term (current) use of aspirin: Secondary | ICD-10-CM | POA: Insufficient documentation

## 2018-11-18 DIAGNOSIS — J449 Chronic obstructive pulmonary disease, unspecified: Secondary | ICD-10-CM | POA: Insufficient documentation

## 2018-11-18 DIAGNOSIS — E7800 Pure hypercholesterolemia, unspecified: Secondary | ICD-10-CM | POA: Insufficient documentation

## 2018-11-18 DIAGNOSIS — H40022 Open angle with borderline findings, high risk, left eye: Secondary | ICD-10-CM

## 2018-11-18 DIAGNOSIS — R42 Dizziness and giddiness: Secondary | ICD-10-CM

## 2018-11-18 LAB — POCT URINALYSIS DIP (CLINITEK)
Bilirubin, UA: NEGATIVE
Blood, UA: NEGATIVE
Glucose, UA: NEGATIVE mg/dL
Ketones, POC UA: NEGATIVE mg/dL
Leukocytes, UA: NEGATIVE
Nitrite, UA: NEGATIVE
POC PROTEIN,UA: NEGATIVE
Spec Grav, UA: 1.015 (ref 1.010–1.025)
Urobilinogen, UA: 0.2 E.U./dL
pH, UA: 5.5 (ref 5.0–8.0)

## 2018-11-18 MED ORDER — FLUTICASONE PROPIONATE 50 MCG/ACT NA SUSP: 2.0000 | Freq: Every day | NASAL | 2 refills | Status: DC

## 2018-11-18 MED ORDER — FINASTERIDE 5 MG PO TABS: 5.0000 mg | ORAL_TABLET | Freq: Every day | ORAL | 6 refills | Status: DC

## 2018-11-18 MED ORDER — ASPIRIN 81 MG PO TBEC: 81.0000 mg | DELAYED_RELEASE_TABLET | Freq: Every day | ORAL | 0 refills | Status: DC

## 2018-11-18 MED ORDER — LOSARTAN POTASSIUM 100 MG PO TABS: 100.0000 mg | ORAL_TABLET | Freq: Every day | ORAL | 2 refills | Status: DC

## 2018-11-18 MED ORDER — AMLODIPINE BESYLATE 5 MG PO TABS: 5.0000 mg | ORAL_TABLET | Freq: Every day | ORAL | 3 refills | Status: DC

## 2018-11-18 MED FILL — FLUTICASONE PROP 50 MCG SPR: 50 | 30 days supply | Qty: 16 | Fill #0

## 2018-11-18 NOTE — Assessment & Plan Note (Signed)
Given glaucoma risk I will discontinue the anticholinergic inhalers

## 2018-11-18 NOTE — Progress Notes (Signed)
120/71  98.2 is temp 99% 90 is HR

## 2018-11-18 NOTE — Assessment & Plan Note (Addendum)
BPH is evident again  Will continue Proscar and tamsulosin and refer to urology  We will also obtain a PSA  Note urinalysis today was normal

## 2018-11-18 NOTE — Assessment & Plan Note (Signed)
Gastroesophageal reflux disease is stable at this time we will continue proton pump inhibitor

## 2018-11-18 NOTE — Assessment & Plan Note (Signed)
Recurrent chest pain with elevated calcium score on coronary CT scan  Plan will be to refer to cardiology

## 2018-11-18 NOTE — Assessment & Plan Note (Signed)
COVID-19 virus infection now is resolved

## 2018-11-18 NOTE — Progress Notes (Signed)
Subjective:    Patient ID: Bryan Sullivan, male    DOB: 11/08/1945, 73 y.o.   MRN: [id number]  This is a 73 year old Arabic male who has had previous visits for COPD and asthma.  The patient then was hospitalized in April of this year with COVID-19 infection.  The patient also had acute kidney injury.  Since that time he has had several telephone visits and this is the first in person visit since his recovery from [place].  The patient states his level of dyspnea is back to baseline.  He has some cough.  He denies any headaches or dizziness.  He said no further loss of taste or smell.  He is not having any muscle aches.  His largest complaint is to complain of a bladder fullness and decreased force of stream of his urine.  He also complains of a right testicle that is swollen and tender.  In addition to this the patient as well has complaints of ongoing chest pain with radiation to the arms.  When he was hospitalized in March of this year a coronary CT scan was done and showed an elevated calcium score of 57.  He has also a right bundle branch block.  He has not yet been referred to cardiology as of this time.  The patient had been on Incruse and also Spiriva in the past along with Advair.  Note he noted increased bladder issues and force of stream issues once starting the Incruse and Spiriva.  1 challenges that every time he goes to the hospital he is given a different set of inhalers at discharge and this is caused some source of confusion.  He also has been on Combivent in the past but I informed the patient the ipratropium component of the Combivent may be aggravating his bladder function.  Note also he has glaucoma and blindness in the left eye and this may be an issue with the ipratropium as well  DC summary from April COVID admit Date of Admission: 09/18/2018  5:52 AM Date of Discharge:  09/23/18  Attending Physician: Lenice Pressman, MD, PhD  Discharge Diagnosis: 1.  COPD  exacerbation due to COVID-19 infection 2.  Acute kidney injury on CKD stage II  Discharge Medications: Allergies as of 09/23/2018  No Known Allergies   Disposition and follow-up:   Bryan Sullivan was discharged from [facility] in Good condition.  At the hospital follow up visit please address:  1.  COVID-19 infection: Please follow-up his symptoms.  He had relatively mild symptoms of COVID-19 and a COPD exacerbation, but had a prolonged hospital stay due to the risk of infecting his family at home.  2. Acute kidney injury: He had an increase in his creatinine during his hospitalization, likely related to volume depletion and poor p.o. intake.  It had not improved significantly by the time of discharge, despite receiving some IV fluid boluses.  Please recheck his renal function and electrolytes to ensure they have returned to normal.  3.  Labs / imaging needed at time of follow-up: [place] by problem list: 1.  COVID-19 infection: Bryan Sullivan is a 73 year old man with history of COPD, stable CAD, and HTN who was admitted with fever, shortness of breath, and cough.  He tested positive for SARS-CoV-2.  CRP was mildly elevated at 1.7 but procalcitonin was negative, and he had mild leukopenia of 2.4 and lymphopenia of 0.3, all consistent with COVID-19.  He was initially treated with broad-spectrum  antibiotics in the ED, but this was narrowed to azithromycin for his COPD exacerbation.  He had mild hypoxia initially that responded to O2 2 L by nasal cannula, and this was weaned quickly.  His fever curve also improved quickly.  Given his overall mild symptoms, he was never treated with any of our experimental therapies for COVID-19.  As is reported with many COVID-19 cases, his d-dimer was elevated at 0.6, increased to 4.6, then decreased to undetectable the day before discharge.  He was kept on DVT prophylaxis throughout his stay and did not have any  overt signs of clotting requiring increased dosage.  Our team had extensive discussions with his family to help them arrange for safe quarantine at home.  He lives with his wife, his son, and his son's family.  It took a number of days for them to have a separate room available for him and they are all aware that he should remain in self quarantine for at least 14 more days.  2.  Acute kidney injury on CKD stage II: Baseline creatinine appears to be approximately 1.1-1.2.  On admission, creatinine was 1.3 and rose to 1.9.  This was likely related to his viral infection and poor p.o. intake.  He received IV fluids, but his renal function had not returned to normal at the time of discharge.  Creatinine was 1.9 on the day before discharge.  Please recheck his creatinine in follow-up to ensure his renal function returns to baseline.  3.  Dysuria: On the day of discharge he noted burning with urination.  Urinalysis was completely normal with no leukocyte esterase or nitrites.  Urine culture was collected and is pending at the time of discharge.  Given his completely normal urinalysis, he was not started on antibiotics.  However, should he have ongoing symptoms of dysuria, would recommend rechecking urinalysis and urine culture and considering antibiotics.  He does have a history of BPH and may be having lower urinary tract symptoms related to that.  Note the patient currently denies any blood in the urine    Past Medical History:  Diagnosis Date  . Asthma    As child   . BPH (benign prostatic hyperplasia)   . COVID-19 09/19/2018  . COVID-19 virus infection 09/18/2018  . Enlarged prostate Dx 2001  . Hypertension    Dx at age 73  . Lumbar radiculopathy   . Open angle with borderline findings and high glaucoma risk in left eye   . Primary open angle glaucoma of right eye, severe stage      Family History  Problem Relation Age of Onset  . Diabetes Mother   . Asthma Father   . Hypertension  Father   . Asthma Sister      Social History   Socioeconomic History  . Marital status: Married    Spouse name: Not on file  . Number of children: Not on file  . Years of education: Not on file  . Highest education level: Not on file  Occupational History  . Not on file  Social Needs  . Financial resource strain: Not on file  . Food insecurity    Worry: Not on file    Inability: Not on file  . Transportation needs    Medical: Not on file    Non-medical: Not on file  Tobacco Use  . Smoking status: Former Smoker    Types: Cigarettes    Quit date: 09/21/1968    Years since quitting: 50.1  .  Smokeless tobacco: Never Used  . Tobacco comment: "quit smoking cigarettes in 1970"  Substance and Sexual Activity  . Alcohol use: No  . Drug use: No  . Sexual activity: Not on file  Lifestyle  . Physical activity    Days per week: Not on file    Minutes per session: Not on file  . Stress: Not on file  Relationships  . Social Herbalist on phone: Not on file    Gets together: Not on file    Attends religious service: Not on file    Active member of club or organization: Not on file    Attends meetings of clubs or organizations: Not on file    Relationship status: Not on file  . Intimate partner violence    Fear of current or ex partner: Not on file    Emotionally abused: Not on file    Physically abused: Not on file    Forced sexual activity: Not on file  Other Topics Concern  . Not on file  Social History Narrative   From [place]   Moved to [place] in 2014      No Known Allergies     Review of Systems  HENT: Negative.   Eyes: Positive for visual disturbance.  Respiratory: Positive for cough, chest tightness, shortness of breath and wheezing.   Cardiovascular: Positive for chest pain. Negative for palpitations and leg swelling.  Gastrointestinal: Negative.   Endocrine: Negative.   Genitourinary: Positive for difficulty urinating, dysuria, flank pain and  testicular pain.  Musculoskeletal: Negative for arthralgias.  Skin: Negative.   Neurological: Negative.   Hematological: Negative.   Psychiatric/Behavioral: Negative.        Objective:   Physical Exam Vitals:   11/18/18 1430  BP: 120/71  Pulse: 90  Temp: 98.2 F (36.8 C)  TempSrc: Oral  SpO2: 99%  Weight: 186 lb 9.6 oz (84.6 kg)  Height: 5\' 10"  (1.778 m)    Gen: Pleasant, well-nourished, in no distress,  normal affect  ENT: No lesions,  mouth clear,  oropharynx clear, no postnasal drip  Neck: No JVD, no TMG, no carotid bruits  Lungs: No use of accessory muscles, no dullness to percussion, distant breath sounds  Cardiovascular: RRR, heart sounds normal, no murmur or gallops, no peripheral edema  Abdomen: soft and NT, no HSM,  BS normal  Musculoskeletal: No deformities, no cyanosis or clubbing  Neuro: alert, non focal  Skin: Warm, no lesions or rashes  The right testicle is slightly enlarged and tender  Rectal exam was performed and the prostate is enlarged and nodular  Urinalysis today was normal    Assessment & Plan:  I personally reviewed all images and lab data in the [facility] system as well as any outside material available during this office visit and agree with the  radiology impressions.   Chronic obstructive airway disease with asthma (HCC) Chronic obstructive lung disease with asthmatic and COPD component overlap  I am concerned about adverse side effects with ipratropium and Incruse  Plan is to discontinue the Combivent and Incruse and use albuterol as needed and continue Advair 1 inhalation twice daily  Gastroesophageal reflux disease Gastroesophageal reflux disease is stable at this time we will continue proton pump inhibitor  BPH (benign prostatic hyperplasia) BPH is evident again  Will continue Proscar and tamsulosin and refer to urology  We will also obtain a PSA  Note urinalysis today was normal  Acute renal failure superimposed on stage  2  chronic kidney disease ([place]) History of acute renal failure imposed on stage II chronic kidney disease  We will recheck BMP  Open angle with borderline findings and high glaucoma risk in left eye Given glaucoma risk I will discontinue the anticholinergic inhalers  Chest pain Recurrent chest pain with elevated calcium score on coronary CT scan  Plan will be to refer to cardiology  COVID-19 virus infection COVID-19 virus infection now is resolved  Dysuria Associated dysuria with normal urinalysis at this visit  Testicular pain, right Enlarged testicle on the right without evidence of torsion  Will refer to urology   Diagnoses and all orders for this visit:  Chronic obstructive airway disease with asthma ([place])  Essential hypertension -     amLODipine (NORVASC) 5 MG tablet; Take 1 tablet (5 mg total) by mouth daily. -     losartan (COZAAR) 100 MG tablet; Take 1 tablet (100 mg total) by mouth daily. -     Comprehensive metabolic panel -     CBC with Differential/Platelet; Future -     CBC with Differential/Platelet  Dizziness -     fluticasone (FLONASE) 50 MCG/ACT nasal spray; Place 2 sprays into both nostrils daily.  Dysuria -     Ambulatory referral to Urology -     POCT URINALYSIS DIP (CLINITEK) -     PSA  Benign prostatic hyperplasia with incomplete bladder emptying -     Ambulatory referral to Urology -     POCT URINALYSIS DIP (CLINITEK) -     PSA  Testicular pain, right -     Ambulatory referral to Urology  Chest pain, unspecified type -     Ambulatory referral to Cardiology  Pure hypercholesterolemia -     Lipid panel  COVID-19 virus infection  Gastroesophageal reflux disease without esophagitis  Acute renal failure with acute tubular necrosis superimposed on stage 2 chronic kidney disease (HCC)  Open angle with borderline findings and high glaucoma risk in left eye  Other orders -     aspirin 81 MG EC tablet; Take 1 tablet (81 mg total) by mouth  daily. -     finasteride (PROSCAR) 5 MG tablet; Take 1 tablet (5 mg total) by mouth daily.

## 2018-11-18 NOTE — Assessment & Plan Note (Signed)
Associated dysuria with normal urinalysis at this visit

## 2018-11-18 NOTE — Patient Instructions (Signed)
Refills on your Advair and Ventolin has been made through the pharmacy program  All of your other medications have been refilled through our pharmacy  A urinalysis and blood work will be obtained today  Referrals to cardiology and neurology were made  Return 1 month

## 2018-11-18 NOTE — Assessment & Plan Note (Signed)
History of acute renal failure imposed on stage II chronic kidney disease  We will recheck BMP

## 2018-11-18 NOTE — Assessment & Plan Note (Signed)
Chronic obstructive lung disease with asthmatic and COPD component overlap  I am concerned about adverse side effects with ipratropium and Incruse  Plan is to discontinue the Combivent and Incruse and use albuterol as needed and continue Advair 1 inhalation twice daily

## 2018-11-18 NOTE — Assessment & Plan Note (Signed)
Enlarged testicle on the right without evidence of torsion  Will refer to urology

## 2018-11-19 LAB — COMPREHENSIVE METABOLIC PANEL
ALT: 16 IU/L (ref 0–44)
AST: 16 IU/L (ref 0–40)
Albumin/Globulin Ratio: 1.4 (ref 1.2–2.2)
Albumin: 4 g/dL (ref 3.7–4.7)
Alkaline Phosphatase: 60 IU/L (ref 39–117)
BUN/Creatinine Ratio: 13 (ref 10–24)
BUN: 16 mg/dL (ref 8–27)
Bilirubin Total: 0.5 mg/dL (ref 0.0–1.2)
CO2: 25 mmol/L (ref 20–29)
Calcium: 9 mg/dL (ref 8.6–10.2)
Chloride: 103 mmol/L (ref 96–106)
Creatinine, Ser: 1.23 mg/dL (ref 0.76–1.27)
GFR calc Af Amer: 66 mL/min/{1.73_m2} (ref >59)
GFR calc non Af Amer: 57 mL/min/{1.73_m2} — ABNORMAL LOW (ref >59)
Globulin, Total: 2.9 g/dL (ref 1.5–4.5)
Glucose: 132 mg/dL — ABNORMAL HIGH (ref 65–99)
Potassium: 4.2 mmol/L (ref 3.5–5.2)
Sodium: 139 mmol/L (ref 134–144)
Total Protein: 6.9 g/dL (ref 6.0–8.5)

## 2018-11-19 LAB — CBC WITH DIFFERENTIAL/PLATELET
Basophils Absolute: 0 10*3/uL (ref 0.0–0.2)
Basos: 1 %
EOS (ABSOLUTE): 0.1 10*3/uL (ref 0.0–0.4)
Eos: 2 %
Hematocrit: 34.5 % — ABNORMAL LOW (ref 37.5–51.0)
Hemoglobin: 11 g/dL — ABNORMAL LOW (ref 13.0–17.7)
Immature Grans (Abs): 0 10*3/uL (ref 0.0–0.1)
Immature Granulocytes: 0 %
Lymphocytes Absolute: 1.1 10*3/uL (ref 0.7–3.1)
Lymphs: 25 %
MCH: 28.9 pg (ref 26.6–33.0)
MCHC: 31.9 g/dL (ref 31.5–35.7)
MCV: 91 fL (ref 79–97)
Monocytes Absolute: 0.3 10*3/uL (ref 0.1–0.9)
Monocytes: 7 %
Neutrophils Absolute: 3 10*3/uL (ref 1.4–7.0)
Neutrophils: 65 %
Platelets: 195 10*3/uL (ref 150–450)
RBC: 3.81 x10E6/uL — ABNORMAL LOW (ref 4.14–5.80)
RDW: 14.6 % (ref 11.6–15.4)
WBC: 4.5 10*3/uL (ref 3.4–10.8)

## 2018-11-19 LAB — LIPID PANEL
Chol/HDL Ratio: 3.6 ratio (ref 0.0–5.0)
Cholesterol, Total: 105 mg/dL (ref 100–199)
HDL: 29 mg/dL — ABNORMAL LOW (ref >39)
LDL Calculated: 51 mg/dL (ref 0–99)
Triglycerides: 124 mg/dL (ref 0–149)
VLDL Cholesterol Cal: 25 mg/dL (ref 5–40)

## 2018-11-19 LAB — PSA: Prostate Specific Ag, Serum: 4.2 ng/mL — ABNORMAL HIGH (ref 0.0–4.0)

## 2018-11-24 ENCOUNTER — Ambulatory Visit: Payer: Medicare Other | Admitting: Nurse Practitioner

## 2018-12-01 MED FILL — ATORVASTATIN CALCIUM 40 MG: 40 | 30 days supply | Qty: 30 | Fill #3

## 2018-12-01 MED FILL — FINASTERIDE 5 MG TABLET: 5 | 30 days supply | Qty: 30 | Fill #0

## 2018-12-01 MED FILL — TAMSULOSIN HCL 0.4 MG CAP: 0.4 | 30 days supply | Qty: 30 | Fill #2

## 2018-12-04 ENCOUNTER — Telehealth: Payer: Self-pay | Admitting: Nurse Practitioner

## 2018-12-04 NOTE — Telephone Encounter (Signed)
Patient states that at the eye dr they made it for both and when he arrived here it was switch for one eye.

## 2018-12-04 NOTE — Telephone Encounter (Signed)
CMA called back, and spoke to the patient's son and patient son was not aware of what eye drop patient is referring.   Pt's son will give me a call back once he speaks to his dad.  CMA did inform that PCP did not change his eyedrop prescription, and patient's son did mention that the doctor at [facility] was the one affiliate with the eye drop.  Patient's son will contact CMA once he speak to the dad to get more clarification which eyedrop they are referring to and changes was made.

## 2018-12-05 MED FILL — FLUOROMETHOLONE 0.1% DROPS: 0.1 | 37 days supply | Qty: 5 | Fill #2

## 2018-12-10 DIAGNOSIS — R351 Nocturia: Secondary | ICD-10-CM | POA: Diagnosis not present

## 2018-12-10 DIAGNOSIS — N403 Nodular prostate with lower urinary tract symptoms: Secondary | ICD-10-CM | POA: Diagnosis not present

## 2018-12-10 DIAGNOSIS — R3912 Poor urinary stream: Secondary | ICD-10-CM | POA: Diagnosis not present

## 2018-12-10 DIAGNOSIS — R972 Elevated prostate specific antigen [PSA]: Secondary | ICD-10-CM | POA: Diagnosis not present

## 2018-12-10 DIAGNOSIS — R350 Frequency of micturition: Secondary | ICD-10-CM | POA: Diagnosis not present

## 2018-12-10 MED FILL — levoFLOXacin 750 MG TABS: 750 | 1 days supply | Qty: 1 | Fill #0

## 2018-12-15 ENCOUNTER — Other Ambulatory Visit: Payer: Self-pay

## 2018-12-15 ENCOUNTER — Ambulatory Visit: Payer: Medicare Other | Admitting: Nurse Practitioner

## 2018-12-18 DIAGNOSIS — Z947 Corneal transplant status: Secondary | ICD-10-CM | POA: Diagnosis not present

## 2018-12-18 DIAGNOSIS — H40023 Open angle with borderline findings, high risk, bilateral: Secondary | ICD-10-CM | POA: Diagnosis not present

## 2018-12-18 DIAGNOSIS — Z961 Presence of intraocular lens: Secondary | ICD-10-CM | POA: Diagnosis not present

## 2018-12-18 MED FILL — DORZOLAMIDE HCL 2% EYE DRP: 2 | 50 days supply | Qty: 10 | Fill #0

## 2018-12-18 MED FILL — TIMOLOL 0.5% EYE DROPS: 0.5 | 38 days supply | Qty: 10 | Fill #0

## 2018-12-18 MED FILL — FLUOROMETHOLONE 0.1% DROPS: 0.1 | 30 days supply | Qty: 10 | Fill #0

## 2018-12-18 MED FILL — LATANOPROST 0.005% EYE DRP: 0.005 | 20 days supply | Qty: 3 | Fill #0

## 2018-12-24 ENCOUNTER — Telehealth: Payer: Self-pay | Admitting: Nurse Practitioner

## 2018-12-24 MED FILL — TIMOLOL 0.5% EYE DROPS: 0.5 | 38 days supply | Qty: 10 | Fill #0

## 2018-12-24 NOTE — Telephone Encounter (Signed)
I called Pt since he submit the CAFA and OC application with out an appt. LVM to call an schedule a financial appt

## 2018-12-25 ENCOUNTER — Ambulatory Visit: Payer: Medicare Other | Admitting: Critical Care Medicine

## 2018-12-29 ENCOUNTER — Ambulatory Visit
Admission: RE | Admit: 2018-12-29 | Discharge: 2018-12-29 | Disposition: A | Discharge status: Home / Self Care | Payer: Medicare Other | Source: Ambulatory Visit | Attending: Family Medicine | Admitting: Family Medicine

## 2018-12-29 ENCOUNTER — Other Ambulatory Visit: Payer: Self-pay | Admitting: Family Medicine

## 2018-12-29 ENCOUNTER — Other Ambulatory Visit: Payer: Self-pay

## 2018-12-29 DIAGNOSIS — Z1211 Encounter for screening for malignant neoplasm of colon: Secondary | ICD-10-CM | POA: Diagnosis not present

## 2018-12-29 DIAGNOSIS — M25562 Pain in left knee: Secondary | ICD-10-CM | POA: Diagnosis not present

## 2018-12-29 DIAGNOSIS — J45909 Unspecified asthma, uncomplicated: Secondary | ICD-10-CM | POA: Diagnosis not present

## 2018-12-29 DIAGNOSIS — M1990 Unspecified osteoarthritis, unspecified site: Secondary | ICD-10-CM

## 2018-12-29 DIAGNOSIS — D500 Iron deficiency anemia secondary to blood loss (chronic): Secondary | ICD-10-CM | POA: Diagnosis not present

## 2018-12-29 DIAGNOSIS — M1711 Unilateral primary osteoarthritis, right knee: Secondary | ICD-10-CM | POA: Diagnosis not present

## 2018-12-29 DIAGNOSIS — I10 Essential (primary) hypertension: Secondary | ICD-10-CM | POA: Diagnosis not present

## 2018-12-30 MED FILL — MELOXICAM 7.5 MG TABLET: 7.5 | 30 days supply | Qty: 30 | Fill #0

## 2018-12-30 MED FILL — SUPREP BOWEL PREP KIT: 17.5-3.13-1 | 1 days supply | Qty: 354 | Fill #0

## 2018-12-31 MED FILL — OMEPRAZOLE 20 MG CAP: 20 | 30 days supply | Qty: 30 | Fill #0

## 2018-12-31 MED FILL — TAMSULOSIN HCL 0.4 MG CAP: 0.4 | 30 days supply | Qty: 60 | Fill #0

## 2018-12-31 MED FILL — FINASTERIDE 5 MG TABLET: 5 | 30 days supply | Qty: 30 | Fill #1

## 2018-12-31 MED FILL — LOSARTAN POTASSIUM 100 MG T: 100 | 30 days supply | Qty: 30 | Fill #1

## 2018-12-31 MED FILL — ATORVASTATIN CALCIUM 40 MG: 40 | 30 days supply | Qty: 30 | Fill #4

## 2019-01-19 DIAGNOSIS — Z961 Presence of intraocular lens: Secondary | ICD-10-CM | POA: Diagnosis not present

## 2019-01-19 DIAGNOSIS — H40023 Open angle with borderline findings, high risk, bilateral: Secondary | ICD-10-CM | POA: Diagnosis not present

## 2019-01-19 DIAGNOSIS — Z947 Corneal transplant status: Secondary | ICD-10-CM | POA: Diagnosis not present

## 2019-01-20 MED FILL — FLUOROMETHOLONE 0.1% DROPS: 0.1 | 75 days supply | Qty: 10 | Fill #0

## 2019-01-20 MED FILL — TIMOLOL 0.5% EYE DROPS: 0.5 | 37 days supply | Qty: 10 | Fill #0

## 2019-01-20 MED FILL — LATANOPROST 0.005% EYE DRP: 0.005 | 18 days supply | Qty: 3 | Fill #0

## 2019-02-03 MED FILL — ATORVASTATIN CALCIUM 40 MG: 40 | 30 days supply | Qty: 30 | Fill #5

## 2019-02-03 MED FILL — LOSARTAN POTASSIUM 100 MG T: 100 | 30 days supply | Qty: 30 | Fill #2

## 2019-02-03 MED FILL — TIMOLOL 0.5% EYE DROPS: 0.5 | 37 days supply | Qty: 10 | Fill #0

## 2019-02-03 MED FILL — LATANOPROST 0.005% EYE DRP: 0.005 | 18 days supply | Qty: 3 | Fill #0

## 2019-02-03 MED FILL — OMEPRAZOLE 20 MG CAP: 20 | 30 days supply | Qty: 30 | Fill #1

## 2019-02-03 MED FILL — FLUOROMETHOLONE 0.1% DROPS: 0.1 | 75 days supply | Qty: 10 | Fill #0

## 2019-02-03 MED FILL — AMLODIPINE BESYLATE 5 MG TA: 5 | 90 days supply | Qty: 90 | Fill #0

## 2019-02-03 MED FILL — TAMSULOSIN HCL 0.4 MG CAP: 0.4 | 30 days supply | Qty: 60 | Fill #1

## 2019-02-03 MED FILL — FINASTERIDE 5 MG TABLET: 5 | 30 days supply | Qty: 30 | Fill #2

## 2019-02-03 MED FILL — DORZOLAMIDE HCL 2% EYE DRP: 2 | 50 days supply | Qty: 10 | Fill #0

## 2019-02-03 MED FILL — ALBUTEROL SUL 2.5 MG/3 ML S: (2.5 MG/3ML | 14 days supply | Qty: 180 | Fill #2

## 2019-02-19 MED FILL — ALBUTEROL SULFATE HFA 108 (: 108 (90 BAS | 18 days supply | Qty: 18 | Fill #0

## 2019-02-19 MED FILL — ADVAIR 250/50 DISKUS: 250-50 | 30 days supply | Qty: 60 | Fill #0

## 2019-02-19 MED FILL — predniSONE 20 MG TABS: 20 | 11 days supply | Qty: 23 | Fill #0

## 2019-03-02 MED FILL — LATANOPROST 0.005% EYE DRP: 0.005 | 18 days supply | Qty: 3 | Fill #1

## 2019-03-02 MED FILL — LOSARTAN POTASSIUM 100 MG T: 100 | 30 days supply | Qty: 30 | Fill #0

## 2019-03-02 MED FILL — TIMOLOL 0.5% EYE DROPS: 0.5 | 37 days supply | Qty: 10 | Fill #1

## 2019-03-02 MED FILL — FINASTERIDE 5 MG TABLET: 5 | 30 days supply | Qty: 30 | Fill #3

## 2019-03-02 MED FILL — ATORVASTATIN CALCIUM 40 MG: 40 | 30 days supply | Qty: 30 | Fill #6

## 2019-03-02 MED FILL — TAMSULOSIN HCL 0.4 MG CAP: 0.4 | 30 days supply | Qty: 60 | Fill #2

## 2019-03-10 MED FILL — DORZOLAMIDE HCL 2% EYE DRP: 2 | 50 days supply | Qty: 10 | Fill #1

## 2019-03-16 MED FILL — LATANOPROST 0.005% EYE DRP: 0.005 | 18 days supply | Qty: 3 | Fill #2

## 2019-03-16 MED FILL — VENTOLIN HFA 90 MCG INHALER: 108 (90 BAS | 25 days supply | Qty: 18 | Fill #1

## 2019-03-16 MED FILL — ADVAIR 250/50 DISKUS: 250-50 | 30 days supply | Qty: 60 | Fill #1

## 2019-03-18 ENCOUNTER — Other Ambulatory Visit: Payer: Self-pay | Admitting: Pharmacist

## 2019-03-18 DIAGNOSIS — J449 Chronic obstructive pulmonary disease, unspecified: Secondary | ICD-10-CM

## 2019-03-18 MED ORDER — ALBUTEROL SULFATE (2.5 MG/3ML) 0.083% IN NEBU: INHALATION_SOLUTION | RESPIRATORY_TRACT | 1 refills | Status: DC

## 2019-04-02 MED FILL — TIMOLOL 0.5% EYE DROPS: 0.5 | 37 days supply | Qty: 10 | Fill #2

## 2019-04-02 MED FILL — TAMSULOSIN HCL 0.4 MG CAP: 0.4 | 30 days supply | Qty: 60 | Fill #3

## 2019-04-02 MED FILL — ATORVASTATIN CALCIUM 40 MG: 40 | 30 days supply | Qty: 30 | Fill #7

## 2019-04-02 MED FILL — LOSARTAN POTASSIUM 100 MG T: 100 | 30 days supply | Qty: 30 | Fill #1

## 2019-04-02 MED FILL — FINASTERIDE 5 MG TABLET: 5 | 30 days supply | Qty: 30 | Fill #4

## 2019-04-21 DIAGNOSIS — Z961 Presence of intraocular lens: Secondary | ICD-10-CM | POA: Diagnosis not present

## 2019-04-21 DIAGNOSIS — Z947 Corneal transplant status: Secondary | ICD-10-CM | POA: Diagnosis not present

## 2019-04-21 DIAGNOSIS — H40023 Open angle with borderline findings, high risk, bilateral: Secondary | ICD-10-CM | POA: Diagnosis not present

## 2019-04-27 ENCOUNTER — Other Ambulatory Visit: Payer: Self-pay | Admitting: Critical Care Medicine

## 2019-04-27 DIAGNOSIS — N401 Enlarged prostate with lower urinary tract symptoms: Secondary | ICD-10-CM

## 2019-04-27 MED FILL — ATORVASTATIN CALCIUM 40 MG: 40 | 30 days supply | Qty: 30 | Fill #8

## 2019-04-27 MED FILL — LOSARTAN POTASSIUM 100 MG T: 100 | 30 days supply | Qty: 30 | Fill #2

## 2019-04-27 MED FILL — FINASTERIDE 5 MG TABLET: 5 | 30 days supply | Qty: 30 | Fill #5

## 2019-04-28 ENCOUNTER — Other Ambulatory Visit: Payer: Self-pay | Admitting: Nurse Practitioner

## 2019-04-28 MED FILL — VENTOLIN HFA 90 MCG INHALER: 108 (90 BAS | 25 days supply | Qty: 18 | Fill #0

## 2019-05-11 MED FILL — LATANOPROST 0.005% EYE DRP: 0.005 | 18 days supply | Qty: 3 | Fill #3

## 2019-05-11 MED FILL — TIMOLOL 0.5% EYE DROPS: 0.5 | 37 days supply | Qty: 10 | Fill #3

## 2019-05-11 MED FILL — VENTOLIN HFA 90 MCG INHALER: 108 (90 BAS | 25 days supply | Qty: 18 | Fill #0

## 2019-06-01 DIAGNOSIS — Z23 Encounter for immunization: Secondary | ICD-10-CM | POA: Diagnosis not present

## 2019-06-01 DIAGNOSIS — E119 Type 2 diabetes mellitus without complications: Secondary | ICD-10-CM | POA: Diagnosis not present

## 2019-06-01 DIAGNOSIS — J45909 Unspecified asthma, uncomplicated: Secondary | ICD-10-CM | POA: Diagnosis not present

## 2019-06-01 DIAGNOSIS — H409 Unspecified glaucoma: Secondary | ICD-10-CM | POA: Diagnosis not present

## 2019-06-01 DIAGNOSIS — J4541 Moderate persistent asthma with (acute) exacerbation: Secondary | ICD-10-CM | POA: Diagnosis not present

## 2019-06-01 DIAGNOSIS — Z125 Encounter for screening for malignant neoplasm of prostate: Secondary | ICD-10-CM | POA: Diagnosis not present

## 2019-06-01 DIAGNOSIS — N320 Bladder-neck obstruction: Secondary | ICD-10-CM | POA: Diagnosis not present

## 2019-06-01 DIAGNOSIS — E785 Hyperlipidemia, unspecified: Secondary | ICD-10-CM | POA: Diagnosis not present

## 2019-06-01 DIAGNOSIS — M25561 Pain in right knee: Secondary | ICD-10-CM | POA: Diagnosis not present

## 2019-06-01 DIAGNOSIS — I10 Essential (primary) hypertension: Secondary | ICD-10-CM | POA: Diagnosis not present

## 2019-06-01 MED FILL — FINASTERIDE 5 MG TABLET: 5 | 30 days supply | Qty: 30 | Fill #6

## 2019-06-01 MED FILL — BREZTRI AEROSPHERE 160-9-4.: 160-9-4.8 | 30 days supply | Qty: 6 | Fill #0

## 2019-06-01 MED FILL — AMLODIPINE BESYLATE 5 MG TA: 5 | 90 days supply | Qty: 90 | Fill #1

## 2019-06-01 MED FILL — LOSARTAN POTASSIUM 100 MG T: 100 | 90 days supply | Qty: 90 | Fill #3

## 2019-06-01 MED FILL — FLUTICASONE PROP 50 MCG SPR: 50 | 30 days supply | Qty: 16 | Fill #0

## 2019-06-02 MED FILL — OMEPRAZOLE 20 MG CAP: 20 | 90 days supply | Qty: 90 | Fill #0

## 2019-06-02 MED FILL — ATORVASTATIN CALCIUM 40 MG: 40 | 30 days supply | Qty: 30 | Fill #9

## 2019-06-02 MED FILL — TAMSULOSIN HCL 0.4 MG CAP: 0.4 | 90 days supply | Qty: 90 | Fill #0

## 2019-06-11 ENCOUNTER — Ambulatory Visit (INDEPENDENT_AMBULATORY_CARE_PROVIDER_SITE_OTHER): Payer: Medicare Other | Admitting: Specialist

## 2019-06-11 ENCOUNTER — Encounter: Payer: Self-pay | Admitting: Specialist

## 2019-06-11 ENCOUNTER — Ambulatory Visit (INDEPENDENT_AMBULATORY_CARE_PROVIDER_SITE_OTHER): Payer: Medicare Other

## 2019-06-11 ENCOUNTER — Other Ambulatory Visit: Payer: Self-pay

## 2019-06-11 VITALS — BP 144/76 | HR 83 | Ht 70.0 in | Wt 185.0 lb

## 2019-06-11 DIAGNOSIS — M1712 Unilateral primary osteoarthritis, left knee: Secondary | ICD-10-CM | POA: Diagnosis not present

## 2019-06-11 DIAGNOSIS — M1711 Unilateral primary osteoarthritis, right knee: Secondary | ICD-10-CM

## 2019-06-11 MED ORDER — DICLOFENAC SODIUM 1 % EX GEL: 4.0000 g | Freq: Four times a day (QID) | CUTANEOUS | 3 refills | Status: DC

## 2019-06-11 MED ORDER — IBUPROFEN 600 MG PO TABS: 600.0000 mg | ORAL_TABLET | Freq: Four times a day (QID) | ORAL | 3 refills | Status: DC | PRN

## 2019-06-11 MED FILL — IBUPROFEN 600 MG TABLET: 600 | 30 days supply | Qty: 120 | Fill #0

## 2019-06-11 MED FILL — LATANOPROST 0.005% EYE DRP: 0.005 | 18 days supply | Qty: 3 | Fill #4

## 2019-06-11 MED FILL — DICLOFENAC SODIUM 1% GEL: 1 | 30 days supply | Qty: 500 | Fill #0

## 2019-06-11 NOTE — Patient Instructions (Addendum)
The main ways of treat osteoarthritis, that are found to be success. Weight loss helps to decrease pain. Exercise is important to maintaining cartilage and thickness and strengthening. NSAIDs like motrin, tylenol, alleve are meds decreasing the inflamation. Ice is okay  In afternoon and evening and hot shower in the am

## 2019-06-11 NOTE — Progress Notes (Signed)
Office Visit Note   Patient: Bryan Sullivan           Date of Birth: February 29, 1946           MRN: [id number] Visit Date: 06/11/2019              Requested by: Gildardo Pounds, NP [place],  [place] [id number] PCP: Gildardo Pounds, NP   Assessment & Plan: Visit Diagnoses:  1. Unilateral primary osteoarthritis, left knee   2. Unilateral primary osteoarthritis, right knee     Plan: he main ways of treat osteoarthritis, that are found to be success. Weight loss helps to decrease pain. Exercise is important to maintaining cartilage and thickness and strengthening. NSAIDs like motrin, tylenol, alleve are meds decreasing the inflamation. Ice is okay  In afternoon and evening and hot shower in the am  Follow-Up Instructions: Return in about 4 weeks (around 07/09/2019).   Orders:  Orders Placed This Encounter  Procedures  . XR Knee 1-2 Views Right   Meds ordered this encounter  Medications  . ibuprofen (ADVIL) 600 MG tablet    Sig: Take 1 tablet (600 mg total) by mouth every 6 (six) hours as needed for moderate pain.    Dispense:  120 tablet    Refill:  3  . diclofenac Sodium (VOLTAREN) 1 % GEL    Sig: Apply 4 g topically 4 (four) times daily.    Dispense:  500 g    Refill:  3      Procedures: No procedures performed   Clinical Data: No additional findings.   Subjective: Chief Complaint  Patient presents with  . Right Knee - Pain  . Left Knee - Pain    74 year old male with history of right knee pain but he has mainly right knee pain. It has been worsening over the last 4 years. With morning prayers he notices pain in the right knee with kneeling and squatting there is pain in the right knee. There is numbness in the legs and feet bilaterally. There is AM stiffness, he is able to grocery shop and able to walk for exercise. No use of arthritis meds. No night pain. No cane or external support. Pain is over the inside of the right knee the left knee pain  is over the anterior knee.     Review of Systems  Constitutional: Negative.  Negative for activity change, appetite change, chills, diaphoresis, fatigue, fever and unexpected weight change.  HENT: Positive for ear pain and tinnitus. Negative for congestion, dental problem, drooling, ear discharge, facial swelling, hearing loss, mouth sores, nosebleeds, postnasal drip, rhinorrhea, sinus pressure, sinus pain, sneezing, sore throat, trouble swallowing and voice change.   Eyes: Negative.  Negative for photophobia, pain, discharge, redness, itching and visual disturbance.  Respiratory: Positive for cough, shortness of breath and wheezing (has asthma). Negative for apnea, choking, chest tightness and stridor.   Cardiovascular: Positive for leg swelling. Negative for chest pain and palpitations.  Gastrointestinal: Negative.   Endocrine: Negative.   Genitourinary: Negative.   Musculoskeletal: Negative.  Negative for arthralgias, back pain, gait problem, joint swelling, myalgias, neck pain and neck stiffness.  Skin: Negative.   Allergic/Immunologic: Negative.  Negative for environmental allergies, food allergies and immunocompromised state.  Neurological: Positive for weakness and numbness.  Hematological: Negative.   Psychiatric/Behavioral: Negative.      Objective: Vital Signs: BP (!) 144/76 (BP Location: Left Arm, Patient Position: Sitting)   Pulse 83   Ht  5\' 10"  (1.778 m)   Wt 185 lb (83.9 kg)   BMI 26.54 kg/m   Physical Exam Constitutional:      Appearance: He is well-developed.  HENT:     Head: Normocephalic and atraumatic.  Eyes:     Pupils: Pupils are equal, round, and reactive to light.  Pulmonary:     Effort: Pulmonary effort is normal.     Breath sounds: Normal breath sounds.  Abdominal:     General: Bowel sounds are normal.     Palpations: Abdomen is soft.  Musculoskeletal:        General: Normal range of motion.     Cervical back: Normal range of motion and neck  supple.  Skin:    General: Skin is warm and dry.  Neurological:     Mental Status: He is alert and oriented to person, place, and time.  Psychiatric:        Behavior: Behavior normal.        Thought Content: Thought content normal.        Judgment: Judgment normal.     Right Knee Exam   Tenderness  The patient is experiencing tenderness in the medial joint line and medial retinaculum.   Left Knee Exam   Tenderness  The patient is experiencing tenderness in the patellar tendon and patella.      Specialty Comments:  No specialty comments available.  Imaging: XR Knee 1-2 Views Right  Result Date: 06/11/2019 AP standing radiographs confirm right knee with medial joint line narrowing and the previous radiographs appear to be mislabelled. Left knee medial and lateral joint lines are well maintained.     PMFS History: Patient Active Problem List   Diagnosis Date Noted  . Pure hypercholesterolemia 11/18/2018  . Testicular pain, right 11/18/2018  . Dysuria 09/23/2018  . Acute renal failure superimposed on stage 2 chronic kidney disease ([place])   . Chest pain 08/20/2018  . Gastroesophageal reflux disease 10/08/2016  . Glaucoma (increased eye pressure) 12/16/2014  . BPH (benign prostatic hyperplasia) 11/24/2014  . DJD (degenerative joint disease) of knee 11/24/2014  . Chronic obstructive airway disease with asthma ([place]) 10/11/2014  . Essential hypertension 09/22/2014  . Open angle with borderline findings and high glaucoma risk in left eye 08/24/2014  . After cataract 02/10/2013  . Lumbar radiculopathy 12/30/2012  . Pseudophakia of both eyes 07/29/2012  . Corneal transplant failure 07/15/2012   Past Medical History:  Diagnosis Date  . Asthma    As child   . BPH (benign prostatic hyperplasia)   . COVID-19 09/19/2018  . COVID-19 virus infection 09/18/2018  . Enlarged prostate Dx 2001  . Hypertension    Dx at age 73  . Lumbar radiculopathy   . Open angle with  borderline findings and high glaucoma risk in left eye   . Primary open angle glaucoma of right eye, severe stage     Family History  Problem Relation Age of Onset  . Diabetes Mother   . Asthma Father   . Hypertension Father   . Asthma Sister     Past Surgical History:  Procedure Laterality Date  . CATARACT EXTRACTION W/ INTRAOCULAR LENS  IMPLANT, BILATERAL Bilateral   . CORNEAL TRANSPLANT Right   . EYE SURGERY     Social History   Occupational History  . Not on file  Tobacco Use  . Smoking status: Former Smoker    Types: Cigarettes    Quit date: 09/21/1968    Years since quitting: 50.7  .  Smokeless tobacco: Never Used  . Tobacco comment: "quit smoking cigarettes in 1970"  Substance and Sexual Activity  . Alcohol use: No  . Drug use: No  . Sexual activity: Not on file

## 2019-06-15 DIAGNOSIS — M25561 Pain in right knee: Secondary | ICD-10-CM | POA: Diagnosis not present

## 2019-06-15 DIAGNOSIS — H409 Unspecified glaucoma: Secondary | ICD-10-CM | POA: Diagnosis not present

## 2019-06-15 DIAGNOSIS — J4541 Moderate persistent asthma with (acute) exacerbation: Secondary | ICD-10-CM | POA: Diagnosis not present

## 2019-06-15 DIAGNOSIS — I10 Essential (primary) hypertension: Secondary | ICD-10-CM | POA: Diagnosis not present

## 2019-06-16 DIAGNOSIS — R3912 Poor urinary stream: Secondary | ICD-10-CM | POA: Diagnosis not present

## 2019-06-16 DIAGNOSIS — R972 Elevated prostate specific antigen [PSA]: Secondary | ICD-10-CM | POA: Diagnosis not present

## 2019-06-16 DIAGNOSIS — N403 Nodular prostate with lower urinary tract symptoms: Secondary | ICD-10-CM | POA: Diagnosis not present

## 2019-06-29 ENCOUNTER — Telehealth: Payer: Self-pay | Admitting: Radiology

## 2019-06-29 MED FILL — BREZTRI AEROSPHERE 160-9-4.: 160-9-4.8 | 30 days supply | Qty: 11 | Fill #0

## 2019-06-29 NOTE — Telephone Encounter (Signed)
Patient came in to the office stating he is unable to take the ibuprofen due to heart problems that he has. He requests another medication be sent to [place].

## 2019-06-29 NOTE — Telephone Encounter (Signed)
Patient came in to the office stating he is unable to take the ibuprofen due to heart problems that he has. He requests another medication be sent to [place]. Please advise pt when called in.

## 2019-07-01 MED FILL — FLUTICASONE-SALMETEROL 250-: 250-50 | 30 days supply | Qty: 60 | Fill #1

## 2019-07-01 MED FILL — LATANOPROST 0.005% EYE DRP: 0.005 | 18 days supply | Qty: 3 | Fill #5

## 2019-07-01 MED FILL — ATORVASTATIN CALCIUM 40 MG: 40 | 30 days supply | Qty: 30 | Fill #10

## 2019-07-02 ENCOUNTER — Other Ambulatory Visit: Payer: Self-pay | Admitting: Specialist

## 2019-07-02 ENCOUNTER — Other Ambulatory Visit: Payer: Self-pay | Admitting: Critical Care Medicine

## 2019-07-02 MED FILL — ALBUTEROL SULFATE HFA 108 (: 108 (90 BAS | 25 days supply | Qty: 18 | Fill #0

## 2019-07-02 MED FILL — FINASTERIDE 5 MG TABLET: 5 | 90 days supply | Qty: 90 | Fill #0

## 2019-07-24 MED FILL — DICLOFENAC SODIUM 1 % GEL: 1 | 30 days supply | Qty: 500 | Fill #1

## 2019-07-31 MED FILL — ATORVASTATIN CALCIUM 40 MG: 40 | 30 days supply | Qty: 30 | Fill #11

## 2019-08-10 DIAGNOSIS — Z23 Encounter for immunization: Secondary | ICD-10-CM | POA: Diagnosis not present

## 2019-08-20 MED FILL — LATANOPROST 0.005% EYE DRP: 0.005 | 18 days supply | Qty: 3 | Fill #6

## 2019-08-20 MED FILL — DORZOLAMIDE HCL 2% EYE DRP: 2 | 50 days supply | Qty: 10 | Fill #1

## 2019-08-20 MED FILL — TIMOLOL 0.5% EYE DROPS: 0.5 | 37 days supply | Qty: 10 | Fill #4

## 2019-08-20 MED FILL — FLUOROMETHOLONE 0.1% DROPS: 0.1 | 90 days supply | Qty: 10 | Fill #1

## 2019-08-31 MED FILL — ATORVASTATIN CALCIUM 40 MG: 40 | 90 days supply | Qty: 90 | Fill #0

## 2019-09-09 MED FILL — AMLODIPINE BESYLATE 5 MG TA: 5 | 90 days supply | Qty: 90 | Fill #0

## 2019-09-09 MED FILL — OMEPRAZOLE 20 MG CAP: 20 | 90 days supply | Qty: 90 | Fill #0

## 2019-09-09 MED FILL — TAMSULOSIN HCL 0.4 MG CAP: 0.4 | 90 days supply | Qty: 90 | Fill #0

## 2019-09-09 MED FILL — LATANOPROST 0.005% EYE DRP: 0.005 | 18 days supply | Qty: 3 | Fill #7

## 2019-09-09 MED FILL — FINASTERIDE 5 MG TABLET: 5 | 90 days supply | Qty: 90 | Fill #0

## 2019-09-09 MED FILL — LOSARTAN POTASSIUM 100 MG T: 100 | 90 days supply | Qty: 90 | Fill #0

## 2019-09-14 DIAGNOSIS — I10 Essential (primary) hypertension: Secondary | ICD-10-CM | POA: Diagnosis not present

## 2019-09-14 DIAGNOSIS — H409 Unspecified glaucoma: Secondary | ICD-10-CM | POA: Diagnosis not present

## 2019-09-14 DIAGNOSIS — M25561 Pain in right knee: Secondary | ICD-10-CM | POA: Diagnosis not present

## 2019-09-14 DIAGNOSIS — M5416 Radiculopathy, lumbar region: Secondary | ICD-10-CM | POA: Diagnosis not present

## 2019-09-14 MED FILL — GABAPENTIN 300 MG CAPSULE: 300 | 30 days supply | Qty: 30 | Fill #0

## 2019-09-14 MED FILL — predniSONE 20 MG TABS: 20 | 8 days supply | Qty: 10 | Fill #0

## 2019-09-21 DIAGNOSIS — N320 Bladder-neck obstruction: Secondary | ICD-10-CM | POA: Diagnosis not present

## 2019-09-21 DIAGNOSIS — E785 Hyperlipidemia, unspecified: Secondary | ICD-10-CM | POA: Diagnosis not present

## 2019-09-21 DIAGNOSIS — M5416 Radiculopathy, lumbar region: Secondary | ICD-10-CM | POA: Diagnosis not present

## 2019-09-21 DIAGNOSIS — I10 Essential (primary) hypertension: Secondary | ICD-10-CM | POA: Diagnosis not present

## 2019-09-21 DIAGNOSIS — M25561 Pain in right knee: Secondary | ICD-10-CM | POA: Diagnosis not present

## 2019-09-21 DIAGNOSIS — J45909 Unspecified asthma, uncomplicated: Secondary | ICD-10-CM | POA: Diagnosis not present

## 2019-09-21 DIAGNOSIS — D649 Anemia, unspecified: Secondary | ICD-10-CM | POA: Diagnosis not present

## 2019-09-21 DIAGNOSIS — H409 Unspecified glaucoma: Secondary | ICD-10-CM | POA: Diagnosis not present

## 2019-09-21 DIAGNOSIS — E119 Type 2 diabetes mellitus without complications: Secondary | ICD-10-CM | POA: Diagnosis not present

## 2019-09-21 MED FILL — ATROPINE 1% EYE DROPS: 1 | 7 days supply | Qty: 2 | Fill #0

## 2019-09-22 MED FILL — BREZTRI AEROSPHERE 160-9-4.: 160-9-4.8 | 30 days supply | Qty: 11 | Fill #0

## 2019-09-29 ENCOUNTER — Telehealth: Payer: Self-pay | Admitting: Specialist

## 2019-09-29 NOTE — Telephone Encounter (Signed)
Patient stopped by.  He is requesting a call back to see if he can be sooner than the day of his appointment or if he could come in a little later that day.   Call back: [phone]

## 2019-09-30 NOTE — Telephone Encounter (Signed)
I called and advised patient that we do not have any other appt times available on 5/12, and if he needed to r/s our first available is not until 6/4.  He wanted to cancel for now.

## 2019-10-07 ENCOUNTER — Ambulatory Visit: Payer: Self-pay

## 2019-10-07 ENCOUNTER — Ambulatory Visit: Payer: Medicare Other | Admitting: Specialist

## 2019-10-07 ENCOUNTER — Encounter: Payer: Self-pay | Admitting: Specialist

## 2019-10-07 ENCOUNTER — Ambulatory Visit (INDEPENDENT_AMBULATORY_CARE_PROVIDER_SITE_OTHER): Payer: Medicare Other | Admitting: Specialist

## 2019-10-07 ENCOUNTER — Other Ambulatory Visit: Payer: Self-pay

## 2019-10-07 VITALS — BP 155/84 | HR 63 | Ht 70.0 in | Wt 187.4 lb

## 2019-10-07 DIAGNOSIS — R2689 Other abnormalities of gait and mobility: Secondary | ICD-10-CM | POA: Diagnosis not present

## 2019-10-07 DIAGNOSIS — W19XXXD Unspecified fall, subsequent encounter: Secondary | ICD-10-CM | POA: Diagnosis not present

## 2019-10-07 DIAGNOSIS — M545 Low back pain, unspecified: Secondary | ICD-10-CM

## 2019-10-07 DIAGNOSIS — M1712 Unilateral primary osteoarthritis, left knee: Secondary | ICD-10-CM | POA: Diagnosis not present

## 2019-10-07 DIAGNOSIS — M1711 Unilateral primary osteoarthritis, right knee: Secondary | ICD-10-CM

## 2019-10-07 DIAGNOSIS — R29898 Other symptoms and signs involving the musculoskeletal system: Secondary | ICD-10-CM

## 2019-10-07 NOTE — Progress Notes (Signed)
Office Visit Note   Patient: Bryan Sullivan           Date of Birth: [birth year]           MRN: [id number] Visit Date: 10/07/2019              Requested by: Gildardo Pounds, NP [place],  [place] [address] PCP: Gildardo Pounds, NP   Assessment & Plan: Visit Diagnoses:  1. Low back pain, unspecified back pain laterality, unspecified chronicity, unspecified whether sciatica present   2. Unilateral primary osteoarthritis, left knee   3. Unilateral primary osteoarthritis, right knee     Plan: Fall Prevention and Home Safety Falls cause injuries and can affect all age groups. It is possible to use preventive measures to significantly decrease the likelihood of falls. There are many simple measures which can make your home safer and prevent falls. OUTDOORS  Repair cracks and edges of walkways and driveways.  Remove high doorway thresholds.  Trim shrubbery on the main path into your home.  Have good outside lighting.  Clear walkways of tools, rocks, debris, and clutter.  Check that handrails are not broken and are securely fastened. Both sides of steps should have handrails.  Have leaves, snow, and ice cleared regularly.  Use sand or salt on walkways during winter months.  In the garage, clean up grease or oil spills. BATHROOM  Install night lights.  Install grab bars by the toilet and in the tub and shower.  Use non-skid mats or decals in the tub or shower.  Place a plastic non-slip stool in the shower to sit on, if needed.  Keep floors dry and clean up all water on the floor immediately.  Remove soap buildup in the tub or shower on a regular basis.  Secure bath mats with non-slip, double-sided rug tape.  Remove throw rugs and tripping hazards from the floors. BEDROOMS  Install night lights.  Make sure a bedside light is easy to reach.  Do not use oversized bedding.  Keep a telephone by your bedside.  Have a firm chair with side arms to  use for getting dressed.  Remove throw rugs and tripping hazards from the floor. KITCHEN  Keep handles on pots and pans turned toward the center of the stove. Use back burners when possible.  Clean up spills quickly and allow time for drying.  Avoid walking on wet floors.  Avoid hot utensils and knives.  Position shelves so they are not too high or low.  Place commonly used objects within easy reach.  If necessary, use a sturdy step stool with a grab bar when reaching.  Keep electrical cables out of the way.  Do not use floor polish or wax that makes floors slippery. If you must use wax, use non-skid floor wax.  Remove throw rugs and tripping hazards from the floor. STAIRWAYS  Never leave objects on stairs.  Place handrails on both sides of stairways and use them. Fix any loose handrails. Make sure handrails on both sides of the stairways are as long as the stairs.  Check carpeting to make sure it is firmly attached along stairs. Make repairs to worn or loose carpet promptly.  Avoid placing throw rugs at the top or bottom of stairways, or properly secure the rug with carpet tape to prevent slippage. Get rid of throw rugs, if possible.  Have an electrician put in a light switch at the top and bottom of the stairs. OTHER FALL  PREVENTION TIPS  Wear low-heel or rubber-soled shoes that are supportive and fit well. Wear closed toe shoes.  When using a stepladder, make sure it is fully opened and both spreaders are firmly locked. Do not climb a closed stepladder.  Add color or contrast paint or tape to grab bars and handrails in your home. Place contrasting color strips on first and last steps.  Learn and use mobility aids as needed. Install an electrical emergency response system.  Turn on lights to avoid dark areas. Replace light bulbs that burn out immediately. Get light switches that glow.  Arrange furniture to create clear pathways. Keep furniture in the same  place.  Firmly attach carpet with non-skid or double-sided tape.  Eliminate uneven floor surfaces.  Select a carpet pattern that does not visually hide the edge of steps.  Be aware of all pets. OTHER HOME SAFETY TIPS  Set the water temperature for 120 F (48.8 C).  Keep emergency numbers on or near the telephone.  Keep smoke detectors on every level of the home and near sleeping areas. Document Released: 05/04/2002 Document Revised: 11/13/2011 Document Reviewed: 08/03/2011 [facility] Patient Information 2014 [place].  Knee is suffering from osteoarthritis, only real proven treatments are  Well padded shoes help. Ice the knee that is suffering from osteoarthritis, only real proven treatments are Weight loss,and exercise. Well padded shoes help. Ice the knee 2-3 times a day 15-20 mins at a time.-3 times a day 15-20 mins at a time. Hot showers in the AM.  Injection with steroid may be of benefit. Hemp CBD capsules, amazon.com 5,000-7,000 mg per bottle, 60 capsules per bottle, take one capsule twice a day. Cane in the left hand to use with left leg weight bearing. Follow-Up Instructions: No follow-ups on file.  Avoid frequent bending and stooping  No lifting greater than 10 lbs. May use ice or moist heat for pain. Well padded shoes help. Ice the knee 2-3 times a day 15-20 mins at a time.-3 times a day 15-20 mins at a time. Hot showers in the AM.  Injection with steroid may be of benefit. Hemp CBD capsules, amazon[employer]com 5,000-7,000 mg per bottle, 60 capsules per bottle, take one capsule twice a day. Cane in the left hand to use with left leg weight bearing.  Avoid frequent bending and stooping  No lifting greater than 10 lbs. May use ice or moist heat for pain. Weight loss is of benefit. Exercise is important to improve your indurance and does allow people to function better inspite of back pain.  EMG/NCV of the left leg by Dr. Ernestina Patches. PT to assess and work on balance  and coordination. Follow-Up Instructions: No follow-ups on file.   Orders:  Orders Placed This Encounter  Procedures  . XR Lumbar Spine 2-3 Views  . XR Knee 1-2 Views Left  . XR Knee 1-2 Views Right   No orders of the defined types were placed in this encounter.     Procedures: No procedures performed   Clinical Data: No additional findings.   Subjective: Chief Complaint  Patient presents with  . Lower Back - Pain    74 year old male with history of lumbar spinal stenosis. Reports having fallen into the bathtub about 3-4 weeks ago. He landed on his back and into the bathtub. He reports that there is left knee pain and weakness. He fell and was able to recover and did not tell his family that he had fallen. He has normal heart lung  and kidney function according to his son and fall was due to weakness in the left leg.  No LOC or visual or voice slurring.     Review of Systems  Constitutional: Negative.  Negative for activity change, appetite change, chills, diaphoresis, fatigue, fever and unexpected weight change.  HENT: Negative for congestion, dental problem, drooling, ear discharge, ear pain, facial swelling, hearing loss, mouth sores, nosebleeds, postnasal drip, rhinorrhea, sinus pressure, sinus pain, sneezing, sore throat, tinnitus, trouble swallowing and voice change.   Eyes: Negative.  Negative for photophobia, pain, discharge, redness, itching and visual disturbance.  Respiratory: Negative.  Negative for apnea, cough, choking, chest tightness, shortness of breath, wheezing and stridor.   Cardiovascular: Negative.  Negative for chest pain, palpitations and leg swelling.  Gastrointestinal: Negative.  Negative for abdominal distention, abdominal pain, anal bleeding, blood in stool, constipation, nausea, rectal pain and vomiting.  Endocrine: Negative.  Negative for cold intolerance, heat intolerance, polydipsia and polyphagia.  Genitourinary: Negative.  Negative for  difficulty urinating, discharge, dysuria, enuresis, flank pain, frequency, genital sores and hematuria.  Musculoskeletal: Positive for back pain. Negative for arthralgias, gait problem, joint swelling, myalgias, neck pain and neck stiffness.  Skin: Negative.  Negative for color change, pallor, rash and wound.  Allergic/Immunologic: Negative.  Negative for environmental allergies, food allergies and immunocompromised state.  Neurological: Positive for weakness. Negative for dizziness, tremors, seizures, syncope, facial asymmetry, speech difficulty, light-headedness, numbness and headaches.  Hematological: Negative.  Negative for adenopathy. Does not bruise/bleed easily.  Psychiatric/Behavioral: Negative for agitation, behavioral problems, confusion, decreased concentration, dysphoric mood, hallucinations, self-injury, sleep disturbance and suicidal ideas. The patient is not nervous/anxious and is not hyperactive.      Objective: Vital Signs: BP (!) 155/84 (BP Location: Left Arm, Patient Position: Sitting)   Pulse 63   Ht 5\' 10"  (1.778 m)   Wt 187 lb 6.3 oz (85 kg)   BMI 26.89 kg/m   Physical Exam Constitutional:      Appearance: He is well-developed.  HENT:     Head: Normocephalic and atraumatic.  Eyes:     Pupils: Pupils are equal, round, and reactive to light.  Pulmonary:     Effort: Pulmonary effort is normal.     Breath sounds: Normal breath sounds.  Abdominal:     General: Bowel sounds are normal.     Palpations: Abdomen is soft.  Musculoskeletal:     Cervical back: Normal range of motion and neck supple.  Skin:    General: Skin is warm and dry.  Neurological:     Mental Status: He is alert and oriented to person, place, and time.  Psychiatric:        Behavior: Behavior normal.        Thought Content: Thought content normal.        Judgment: Judgment normal.     Back Exam   Tenderness  The patient is experiencing tenderness in the lumbar.  Range of Motion   Extension: abnormal  Flexion: abnormal  Lateral bend right: normal  Lateral bend left: normal  Rotation right: normal  Rotation left: normal   Muscle Strength  Right Quadriceps:  5/5  Left Quadriceps:  4/5  Right Hamstrings:  5/5  Left Hamstrings:  5/5   Reflexes  Patellar: 1/4 Achilles: 1/4 Babinski's sign: normal   Other  Toe walk: abnormal Heel walk: abnormal Erythema: no back redness  Comments:  S negative, no focal motor deficit.      Specialty Comments:  No specialty comments  available.  Imaging: No results found.   PMFS History: Patient Active Problem List   Diagnosis Date Noted  . Pure hypercholesterolemia 11/18/2018  . Testicular pain, right 11/18/2018  . Dysuria 09/23/2018  . Acute renal failure superimposed on stage 2 chronic kidney disease ([place])   . Chest pain 08/20/2018  . Gastroesophageal reflux disease 10/08/2016  . Glaucoma (increased eye pressure) 12/16/2014  . BPH (benign prostatic hyperplasia) 11/24/2014  . DJD (degenerative joint disease) of knee 11/24/2014  . Chronic obstructive airway disease with asthma ([place]) 10/11/2014  . Essential hypertension 09/22/2014  . Open angle with borderline findings and high glaucoma risk in left eye 08/24/2014  . After cataract 02/10/2013  . Lumbar radiculopathy 12/30/2012  . Pseudophakia of both eyes 07/29/2012  . Corneal transplant failure 07/15/2012   Past Medical History:  Diagnosis Date  . Asthma    As child   . BPH (benign prostatic hyperplasia)   . COVID-19 09/19/2018  . COVID-19 virus infection 09/18/2018  . Enlarged prostate Dx 2001  . Hypertension    Dx at age 27  . Lumbar radiculopathy   . Open angle with borderline findings and high glaucoma risk in left eye   . Primary open angle glaucoma of right eye, severe stage     Family History  Problem Relation Age of Onset  . Diabetes Mother   . Asthma Father   . Hypertension Father   . Asthma Sister     Past Surgical History:   Procedure Laterality Date  . CATARACT EXTRACTION W/ INTRAOCULAR LENS  IMPLANT, BILATERAL Bilateral   . CORNEAL TRANSPLANT Right   . EYE SURGERY     Social History   Occupational History  . Not on file  Tobacco Use  . Smoking status: Former Smoker    Types: Cigarettes    Quit date: 09/21/1968    Years since quitting: 51.0  . Smokeless tobacco: Never Used  . Tobacco comment: "quit smoking cigarettes in 1970"  Substance and Sexual Activity  . Alcohol use: No  . Drug use: No  . Sexual activity: Not on file

## 2019-10-07 NOTE — Patient Instructions (Addendum)
Fall Prevention and Home Safety Falls cause injuries and can affect all age groups. It is possible to use preventive measures to significantly decrease the likelihood of falls. There are many simple measures which can make your home safer and prevent falls. OUTDOORS  Repair cracks and edges of walkways and driveways.  Remove high doorway thresholds.  Trim shrubbery on the main path into your home.  Have good outside lighting.  Clear walkways of tools, rocks, debris, and clutter.  Check that handrails are not broken and are securely fastened. Both sides of steps should have handrails.  Have leaves, snow, and ice cleared regularly.  Use sand or salt on walkways during winter months.  In the garage, clean up grease or oil spills. BATHROOM  Install night lights.  Install grab bars by the toilet and in the tub and shower.  Use non-skid mats or decals in the tub or shower.  Place a plastic non-slip stool in the shower to sit on, if needed.  Keep floors dry and clean up all water on the floor immediately.  Remove soap buildup in the tub or shower on a regular basis.  Secure bath mats with non-slip, double-sided rug tape.  Remove throw rugs and tripping hazards from the floors. BEDROOMS  Install night lights.  Make sure a bedside light is easy to reach.  Do not use oversized bedding.  Keep a telephone by your bedside.  Have a firm chair with side arms to use for getting dressed.  Remove throw rugs and tripping hazards from the floor. KITCHEN  Keep handles on pots and pans turned toward the center of the stove. Use back burners when possible.  Clean up spills quickly and allow time for drying.  Avoid walking on wet floors.  Avoid hot utensils and knives.  Position shelves so they are not too high or low.  Place commonly used objects within easy reach.  If necessary, use a sturdy step stool with a grab bar when reaching.  Keep electrical cables out of the  way.  Do not use floor polish or wax that makes floors slippery. If you must use wax, use non-skid floor wax.  Remove throw rugs and tripping hazards from the floor. STAIRWAYS  Never leave objects on stairs.  Place handrails on both sides of stairways and use them. Fix any loose handrails. Make sure handrails on both sides of the stairways are as long as the stairs.  Check carpeting to make sure it is firmly attached along stairs. Make repairs to worn or loose carpet promptly.  Avoid placing throw rugs at the top or bottom of stairways, or properly secure the rug with carpet tape to prevent slippage. Get rid of throw rugs, if possible.  Have an electrician put in a light switch at the top and bottom of the stairs. OTHER FALL PREVENTION TIPS  Wear low-heel or rubber-soled shoes that are supportive and fit well. Wear closed toe shoes.  When using a stepladder, make sure it is fully opened and both spreaders are firmly locked. Do not climb a closed stepladder.  Add color or contrast paint or tape to grab bars and handrails in your home. Place contrasting color strips on first and last steps.  Learn and use mobility aids as needed. Install an electrical emergency response system.  Turn on lights to avoid dark areas. Replace light bulbs that burn out immediately. Get light switches that glow.  Arrange furniture to create clear pathways. Keep furniture in the same place.    Firmly attach carpet with non-skid or double-sided tape.  Eliminate uneven floor surfaces.  Select a carpet pattern that does not visually hide the edge of steps.  Be aware of all pets. OTHER HOME SAFETY TIPS  Set the water temperature for 120 F (48.8 C).  Keep emergency numbers on or near the telephone.  Keep smoke detectors on every level of the home and near sleeping areas. Document Released: 05/04/2002 Document Revised: 11/13/2011 Document Reviewed: 08/03/2011 [facility] Patient Information 2014  [place].  Knee is suffering from osteoarthritis, only real proven treatments are  Well padded shoes help. Ice the knee that is suffering from osteoarthritis, only real proven treatments are Weight loss,and exercise. Well padded shoes help. Ice the knee 2-3 times a day 15-20 mins at a time.-3 times a day 15-20 mins at a time. Hot showers in the AM.  Injection with steroid may be of benefit. Hemp CBD capsules, amazon.com 5,000-7,000 mg per bottle, 60 capsules per bottle, take one capsule twice a day. Cane in the left hand to use with left leg weight bearing. Follow-Up Instructions: No follow-ups on file.  Avoid frequent bending and stooping  No lifting greater than 10 lbs. May use ice or moist heat for pain. Weight loss is of benefit.  Exercise is important to improve your indurance and does allow people to function better inspite of back pain.  EMG/NCV of the left leg by Dr. Ernestina Patches. PT to assess and work on balance and coordination.

## 2019-10-19 ENCOUNTER — Other Ambulatory Visit: Payer: Self-pay | Admitting: Ophthalmology

## 2019-10-19 DIAGNOSIS — H5212 Myopia, left eye: Secondary | ICD-10-CM | POA: Diagnosis not present

## 2019-10-19 DIAGNOSIS — Z947 Corneal transplant status: Secondary | ICD-10-CM | POA: Diagnosis not present

## 2019-10-19 DIAGNOSIS — H524 Presbyopia: Secondary | ICD-10-CM | POA: Diagnosis not present

## 2019-10-19 DIAGNOSIS — H52202 Unspecified astigmatism, left eye: Secondary | ICD-10-CM | POA: Diagnosis not present

## 2019-10-19 DIAGNOSIS — Z961 Presence of intraocular lens: Secondary | ICD-10-CM | POA: Diagnosis not present

## 2019-10-19 DIAGNOSIS — H40023 Open angle with borderline findings, high risk, bilateral: Secondary | ICD-10-CM | POA: Diagnosis not present

## 2019-10-19 LAB — HM DIABETES EYE EXAM

## 2019-11-09 ENCOUNTER — Other Ambulatory Visit: Payer: Self-pay | Admitting: Family Medicine

## 2019-11-09 MED FILL — FLUOROMETHOLONE 0.1% DROPS: 0.1 | 75 days supply | Qty: 10 | Fill #1

## 2019-11-09 MED FILL — LATANOPROST 0.005% EYE DRP: 0.005 | 36 days supply | Qty: 5 | Fill #9

## 2019-11-09 MED FILL — ATORVASTATIN CALCIUM 40 MG: 40 | 90 days supply | Qty: 90 | Fill #0

## 2019-11-09 MED FILL — TIMOLOL 0.5% EYE DROPS: 0.5 | 74 days supply | Qty: 20 | Fill #6

## 2019-11-09 MED FILL — DICLOFENAC SODIUM 1% GEL: 1 | 30 days supply | Qty: 500 | Fill #2

## 2019-11-10 ENCOUNTER — Other Ambulatory Visit: Payer: Self-pay | Admitting: Family Medicine

## 2019-11-10 MED FILL — PROAIR HFA 90 MCG INHALER: 108 (90 BAS | 90 days supply | Qty: 26 | Fill #0

## 2019-11-10 MED FILL — BREZTRI AEROSPHERE 160-9-4.: 160-9-4.8 | 90 days supply | Qty: 32 | Fill #0

## 2019-11-11 MED FILL — AMLODIPINE BESYLATE 5 MG TA: 5 | 90 days supply | Qty: 90 | Fill #0

## 2019-11-12 ENCOUNTER — Other Ambulatory Visit: Payer: Self-pay | Admitting: Specialist

## 2019-11-12 ENCOUNTER — Ambulatory Visit (INDEPENDENT_AMBULATORY_CARE_PROVIDER_SITE_OTHER): Payer: Medicare Other | Admitting: Specialist

## 2019-11-12 ENCOUNTER — Encounter: Payer: Self-pay | Admitting: Specialist

## 2019-11-12 ENCOUNTER — Other Ambulatory Visit: Payer: Self-pay

## 2019-11-12 VITALS — BP 125/71 | HR 76 | Ht 70.0 in | Wt 187.0 lb

## 2019-11-12 DIAGNOSIS — M47816 Spondylosis without myelopathy or radiculopathy, lumbar region: Secondary | ICD-10-CM | POA: Diagnosis not present

## 2019-11-12 DIAGNOSIS — M5136 Other intervertebral disc degeneration, lumbar region: Secondary | ICD-10-CM | POA: Diagnosis not present

## 2019-11-12 DIAGNOSIS — M1711 Unilateral primary osteoarthritis, right knee: Secondary | ICD-10-CM | POA: Diagnosis not present

## 2019-11-12 DIAGNOSIS — R29898 Other symptoms and signs involving the musculoskeletal system: Secondary | ICD-10-CM

## 2019-11-12 DIAGNOSIS — M1712 Unilateral primary osteoarthritis, left knee: Secondary | ICD-10-CM | POA: Diagnosis not present

## 2019-11-12 MED ORDER — DICLOFENAC SODIUM 1 % EX GEL: 4.0000 g | Freq: Four times a day (QID) | CUTANEOUS | 3 refills | Status: DC

## 2019-11-12 MED ORDER — IBUPROFEN 400 MG PO TABS: 400.0000 mg | ORAL_TABLET | Freq: Three times a day (TID) | ORAL | 4 refills | Status: DC | PRN

## 2019-11-12 MED FILL — LOSARTAN POTASSIUM 100 MG T: 100 | 90 days supply | Qty: 90 | Fill #0

## 2019-11-12 MED FILL — FINASTERIDE 5 MG TABLET: 5 | 90 days supply | Qty: 90 | Fill #0

## 2019-11-12 NOTE — Patient Instructions (Signed)
Plan: Fall Prevention and Home Safety Falls cause injuries and can affect all age groups. It is possible to use preventive measures to significantly decrease the likelihood of falls. There are many simple measures which can make your home safer and prevent falls. OUTDOORS  Repair cracks and edges of walkways and driveways.  Remove high doorway thresholds.  Trim shrubbery on the main path into your home.  Have good outside lighting.  Clear walkways of tools, rocks, debris, and clutter.  Check that handrails are not broken and are securely fastened. Both sides of steps should have handrails.  Have leaves, snow, and ice cleared regularly.  Use sand or salt on walkways during winter months.  In the garage, clean up grease or oil spills. BATHROOM  Install night lights.  Install grab bars by the toilet and in the tub and shower.  Use non-skid mats or decals in the tub or shower.  Place a plastic non-slip stool in the shower to sit on, if needed.  Keep floors dry and clean up all water on the floor immediately.  Remove soap buildup in the tub or shower on a regular basis.  Secure bath mats with non-slip, double-sided rug tape.  Remove throw rugs and tripping hazards from the floors. BEDROOMS  Install night lights.  Make sure a bedside light is easy to reach.  Do notuse oversized bedding.  Keep a telephone by your bedside.  Have a firm chair with side arms to use for getting dressed.  Remove throw rugs and tripping hazards from the floor. KITCHEN  Keep handles on pots and pans turned toward the center of the stove. Use back burners when possible.  Clean up spills quickly and allow time for drying.  Avoid walking on wet floors.  Avoid hot utensils and knives.  Position shelves so they are not too high or low.  Place commonly used objects within easy reach.  If necessary, use a sturdy step stool with a grab bar when reaching.  Keep electrical cables out of  the way.  Do notuse floor polish or wax that makes floors slippery. If you must use wax, use non-skid floor wax.  Remove throw rugs and tripping hazards from the floor. STAIRWAYS  Never leave objects on stairs.  Place handrails on both sides of stairways and use them. Fix any loose handrails. Make sure handrails on both sides of the stairways are as long as the stairs.  Check carpeting to make sure it is firmly attached along stairs. Make repairs to worn or loose carpet promptly.  Avoid placing throw rugs at the top or bottom of stairways, or properly secure the rug with carpet tape to prevent slippage. Get rid of throw rugs, if possible.  Have an electrician put in a light switch at the top and bottom of the stairs. OTHER FALL PREVENTION TIPS  Wear low-heel or rubber-soled shoes that are supportive and fit well. Wear closed toe shoes.  When using a stepladder, make sure it is fully opened and both spreaders are firmly locked. Do notclimb a closed stepladder.  Add color or contrast paint or tape to grab bars and handrails in your home. Place contrasting color strips on first and last steps.  Learn and use mobility aids as needed. Install an electrical emergency response system.  Turn on lights to avoid dark areas. Replace light bulbs that burn out immediately. Get light switches that glow.  Arrange furniture to create clear pathways. Keep furniture in the same place.  Firmly  attach carpet with non-skid or double-sided tape.  Eliminate uneven floor surfaces.  Select a carpet pattern that does not visually hide the edge of steps.  Be aware of all pets. OTHER HOME SAFETY TIPS  Set the water temperature for 120 F (48.8 C).  Keep emergency numbers on or near the telephone.  Keep smoke detectors on every level of the home and near sleeping areas. Document Released: 05/04/2002 Document Revised: 11/13/2011 Document Reviewed: 08/03/2011 [facility] Patient Information 2014  [place] [employer] [place].  Knee is suffering from osteoarthritis, only real proven treatments are  Well padded shoes help. Ice the knee that is suffering from osteoarthritis, only real proven treatments are Weight loss,and exercise. Well padded shoes help. Ice the knee 2-3 times a day 15-20 mins at a time.-3 times a day 15-20 mins at a time. Hot showers in the AM.  Injection with steroid may be of benefit. Hemp CBD capsules, [email or web address] 5,000-7,000 mg per bottle, 60 capsules per bottle, take one capsule twice a day. Cane in the left hand to use with left leg weight bearing. Follow-Up Instructions: No follow-ups on file.  Avoid frequent bending and stooping  No lifting greater than 10 lbs. May use ice or moist heat for pain. Well padded shoes help. Ice the knee 2-3 times a day 15-20 mins at a time.-3 times a day 15-20 mins at a time. Hot showers in the AM.  Injection with steroid may be of benefit. Hemp CBD capsules, [email or web address] 5,000-7,000 mg per bottle, 60 capsules per bottle, take one capsule twice a day. Cane in the left hand to use with left leg weight bearing.  Avoid frequent bending and stooping  No lifting greater than 10 lbs. May use ice or moist heat for pain. Weight loss is of benefit. Exercise is important to improve your indurance and does allow people to function better inspite of back pain.   Back Exercises These exercises help to make your trunk and back strong. They also help to keep the lower back flexible. Doing these exercises can help to prevent back pain or lessen existing pain.  If you have back pain, try to do these exercises 2-3 times each day or as told by your doctor.  As you get better, do the exercises once each day. Repeat the exercises more often as told by your doctor.  To stop back pain from coming back, do the exercises once each day, or as told by your doctor. Exercises Single knee to chest Do these steps 3-5 times in a row for each leg: 1. Lie on  your back on a firm bed or the floor with your legs stretched out. 2. Bring one knee to your chest. 3. Grab your knee or thigh with both hands and hold them it in place. 4. Pull on your knee until you feel a gentle stretch in your lower back or buttocks. 5. Keep doing the stretch for 10-30 seconds. 6. Slowly let go of your leg and straighten it. Pelvic tilt Do these steps 5-10 times in a row: 1. Lie on your back on a firm bed or the floor with your legs stretched out. 2. Bend your knees so they point up to the ceiling. Your feet should be flat on the floor. 3. Tighten your lower belly (abdomen) muscles to press your lower back against the floor. This will make your tailbone point up to the ceiling instead of pointing down to your feet or the floor. 4. Stay in this position for 5-10 seconds  while you gently tighten your muscles and breathe evenly. Cat-cow Do these steps until your lower back bends more easily: 1. Get on your hands and knees on a firm surface. Keep your hands under your shoulders, and keep your knees under your hips. You may put padding under your knees. 2. Let your head hang down toward your chest. Tighten (contract) the muscles in your belly. Point your tailbone toward the floor so your lower back becomes rounded like the back of a cat. 3. Stay in this position for 5 seconds. 4. Slowly lift your head. Let the muscles of your belly relax. Point your tailbone up toward the ceiling so your back forms a sagging arch like the back of a cow. 5. Stay in this position for 5 seconds.  Press-ups Do these steps 5-10 times in a row: 1. Lie on your belly (face-down) on the floor. 2. Place your hands near your head, about shoulder-width apart. 3. While you keep your back relaxed and keep your hips on the floor, slowly straighten your arms to raise the top half of your body and lift your shoulders. Do not use your back muscles. You may change where you place your hands in order to make  yourself more comfortable. 4. Stay in this position for 5 seconds. 5. Slowly return to lying flat on the floor.  Bridges Do these steps 10 times in a row: 1. Lie on your back on a firm surface. 2. Bend your knees so they point up to the ceiling. Your feet should be flat on the floor. Your arms should be flat at your sides, next to your body. 3. Tighten your butt muscles and lift your butt off the floor until your waist is almost as high as your knees. If you do not feel the muscles working in your butt and the back of your thighs, slide your feet 1-2 inches farther away from your butt. 4. Stay in this position for 3-5 seconds. 5. Slowly lower your butt to the floor, and let your butt muscles relax. If this exercise is too easy, try doing it with your arms crossed over your chest. Belly crunches Do these steps 5-10 times in a row: 1. Lie on your back on a firm bed or the floor with your legs stretched out. 2. Bend your knees so they point up to the ceiling. Your feet should be flat on the floor. 3. Cross your arms over your chest. 4. Tip your chin a little bit toward your chest but do not bend your neck. 5. Tighten your belly muscles and slowly raise your chest just enough to lift your shoulder blades a tiny bit off of the floor. Avoid raising your body higher than that, because it can put too much stress on your low back. 6. Slowly lower your chest and your head to the floor. Back lifts Do these steps 5-10 times in a row: 1. Lie on your belly (face-down) with your arms at your sides, and rest your forehead on the floor. 2. Tighten the muscles in your legs and your butt. 3. Slowly lift your chest off of the floor while you keep your hips on the floor. Keep the back of your head in line with the curve in your back. Look at the floor while you do this. 4. Stay in this position for 3-5 seconds. 5. Slowly lower your chest and your face to the floor. Contact a doctor if:  Your back pain gets a  lot worse  when you do an exercise.  Your back pain does not get better 2 hours after you exercise. If you have any of these problems, stop doing the exercises. Do not do them again unless your doctor says it is okay. Get help right away if:  You have sudden, very bad back pain. If this happens, stop doing the exercises. Do not do them again unless your doctor says it is okay. This information is not intended to replace advice given to you by your health care provider. Make sure you discuss any questions you have with your health care provider. Document Revised: 02/06/2018 Document Reviewed: 02/06/2018 [employer] Patient Education  2020 [employer].

## 2019-11-12 NOTE — Progress Notes (Addendum)
Office Visit Note   Patient: Bryan Sullivan           Date of Birth: March 26, 1946           MRN: [id number] Visit Date: 11/12/2019              Requested by: Gildardo Pounds, NP [place],  [place] [address] PCP: Gildardo Pounds, NP   Assessment & Plan: Visit Diagnoses:  1. Unilateral primary osteoarthritis, left knee   2. Unilateral primary osteoarthritis, right knee   3. Weakness of left leg   4. Degenerative disc disease, lumbar   5. Spondylosis without myelopathy or radiculopathy, lumbar region     Plan:  Plan: Fall Prevention and Home Safety Falls cause injuries and can affect all age groups. It is possible to use preventive measures to significantly decrease the likelihood of falls. There are many simple measures which can make your home safer and prevent falls. OUTDOORS  Repair cracks and edges of walkways and driveways.  Remove high doorway thresholds.  Trim shrubbery on the main path into your home.  Have good outside lighting.  Clear walkways of tools, rocks, debris, and clutter.  Check that handrails are not broken and are securely fastened. Both sides of steps should have handrails.  Have leaves, snow, and ice cleared regularly.  Use sand or salt on walkways during winter months.  In the garage, clean up grease or oil spills. BATHROOM  Install night lights.  Install grab bars by the toilet and in the tub and shower.  Use non-skid mats or decals in the tub or shower.  Place a plastic non-slip stool in the shower to sit on, if needed.  Keep floors dry and clean up all water on the floor immediately.  Remove soap buildup in the tub or shower on a regular basis.  Secure bath mats with non-slip, double-sided rug tape.  Remove throw rugs and tripping hazards from the floors. BEDROOMS  Install night lights.  Make sure a bedside light is easy to reach.  Do notuse oversized bedding.  Keep a telephone by your bedside.  Have a  firm chair with side arms to use for getting dressed.  Remove throw rugs and tripping hazards from the floor. KITCHEN  Keep handles on pots and pans turned toward the center of the stove. Use back burners when possible.  Clean up spills quickly and allow time for drying.  Avoid walking on wet floors.  Avoid hot utensils and knives.  Position shelves so they are not too high or low.  Place commonly used objects within easy reach.  If necessary, use a sturdy step stool with a grab bar when reaching.  Keep electrical cables out of the way.  Do notuse floor polish or wax that makes floors slippery. If you must use wax, use non-skid floor wax.  Remove throw rugs and tripping hazards from the floor. STAIRWAYS  Never leave objects on stairs.  Place handrails on both sides of stairways and use them. Fix any loose handrails. Make sure handrails on both sides of the stairways are as long as the stairs.  Check carpeting to make sure it is firmly attached along stairs. Make repairs to worn or loose carpet promptly.  Avoid placing throw rugs at the top or bottom of stairways, or properly secure the rug with carpet tape to prevent slippage. Get rid of throw rugs, if possible.  Have an electrician put in a light switch at the  top and bottom of the stairs. OTHER FALL PREVENTION TIPS  Wear low-heel or rubber-soled shoes that are supportive and fit well. Wear closed toe shoes.  When using a stepladder, make sure it is fully opened and both spreaders are firmly locked. Do notclimb a closed stepladder.  Add color or contrast paint or tape to grab bars and handrails in your home. Place contrasting color strips on first and last steps.  Learn and use mobility aids as needed. Install an electrical emergency response system.  Turn on lights to avoid dark areas. Replace light bulbs that burn out immediately. Get light switches that glow.  Arrange furniture to create clear pathways. Keep  furniture in the same place.  Firmly attach carpet with non-skid or double-sided tape.  Eliminate uneven floor surfaces.  Select a carpet pattern that does not visually hide the edge of steps.  Be aware of all pets. OTHER HOME SAFETY TIPS  Set the water temperature for 120 F (48.8 C).  Keep emergency numbers on or near the telephone.  Keep smoke detectors on every level of the home and near sleeping areas. Document Released: 05/04/2002 Document Revised: 11/13/2011 Document Reviewed: 08/03/2011 [facility] Patient Information 2014 [place].  Knee is suffering from osteoarthritis, only real proven treatments are  Well padded shoes help. Ice the knee that is suffering from osteoarthritis, only real proven treatments are Weight loss,and exercise. Well padded shoes help. Ice the knee 2-3 times a day 15-20 mins at a time.-3 times a day 15-20 mins at a time. Hot showers in the AM.  Injection with steroid may be of benefit. Hemp CBD capsules, amazon.com 5,000-7,000 mg per bottle, 60 capsules per bottle, take one capsule twice a day. Cane in the left hand to use with left leg weight bearing. Follow-Up Instructions: No follow-ups on file.  Avoid frequent bending and stooping  No lifting greater than 10 lbs. May use ice or moist heat for pain. Well padded shoes help. Ice the knee 2-3 times a day 15-20 mins at a time.-3 times a day 15-20 mins at a time. Hot showers in the AM.  Injection with steroid may be of benefit. Hemp CBD capsules, amazon.com 5,000-7,000 mg per bottle, 60 capsules per bottle, take one capsule twice a day. Cane in the left hand to use with left leg weight bearing.  Avoid frequent bending and stooping  No lifting greater than 10 lbs. May use ice or moist heat for pain. Weight loss is of benefit. Exercise is important to improve your indurance and does allow people to function better inspite of back pain.   Back Exercises These exercises help to make  your trunk and back strong. They also help to keep the lower back flexible. Doing these exercises can help to prevent back pain or lessen existing pain.  If you have back pain, try to do these exercises 2-3 times each day or as told by your doctor.  As you get better, do the exercises once each day. Repeat the exercises more often as told by your doctor.  To stop back pain from coming back, do the exercises once each day, or as told by your doctor. Exercises Single knee to chest Do these steps 3-5 times in a row for each leg: 1. Lie on your back on a firm bed or the floor with your legs stretched out. 2. Bring one knee to your chest. 3. Grab your knee or thigh with both hands and hold them it in place. 4. Pull  on your knee until you feel a gentle stretch in your lower back or buttocks. 5. Keep doing the stretch for 10-30 seconds. 6. Slowly let go of your leg and straighten it. Pelvic tilt Do these steps 5-10 times in a row: 1. Lie on your back on a firm bed or the floor with your legs stretched out. 2. Bend your knees so they point up to the ceiling. Your feet should be flat on the floor. 3. Tighten your lower belly (abdomen) muscles to press your lower back against the floor. This will make your tailbone point up to the ceiling instead of pointing down to your feet or the floor. 4. Stay in this position for 5-10 seconds while you gently tighten your muscles and breathe evenly. Cat-cow Do these steps until your lower back bends more easily: 1. Get on your hands and knees on a firm surface. Keep your hands under your shoulders, and keep your knees under your hips. You may put padding under your knees. 2. Let your head hang down toward your chest. Tighten (contract) the muscles in your belly. Point your tailbone toward the floor so your lower back becomes rounded like the back of a cat. 3. Stay in this position for 5 seconds. 4. Slowly lift your head. Let the muscles of your belly relax.  Point your tailbone up toward the ceiling so your back forms a sagging arch like the back of a cow. 5. Stay in this position for 5 seconds.  Press-ups Do these steps 5-10 times in a row: 1. Lie on your belly (face-down) on the floor. 2. Place your hands near your head, about shoulder-width apart. 3. While you keep your back relaxed and keep your hips on the floor, slowly straighten your arms to raise the top half of your body and lift your shoulders. Do not use your back muscles. You may change where you place your hands in order to make yourself more comfortable. 4. Stay in this position for 5 seconds. 5. Slowly return to lying flat on the floor.  Bridges Do these steps 10 times in a row: 1. Lie on your back on a firm surface. 2. Bend your knees so they point up to the ceiling. Your feet should be flat on the floor. Your arms should be flat at your sides, next to your body. 3. Tighten your butt muscles and lift your butt off the floor until your waist is almost as high as your knees. If you do not feel the muscles working in your butt and the back of your thighs, slide your feet 1-2 inches farther away from your butt. 4. Stay in this position for 3-5 seconds. 5. Slowly lower your butt to the floor, and let your butt muscles relax. If this exercise is too easy, try doing it with your arms crossed over your chest. Belly crunches Do these steps 5-10 times in a row: 1. Lie on your back on a firm bed or the floor with your legs stretched out. 2. Bend your knees so they point up to the ceiling. Your feet should be flat on the floor. 3. Cross your arms over your chest. 4. Tip your chin a little bit toward your chest but do not bend your neck. 5. Tighten your belly muscles and slowly raise your chest just enough to lift your shoulder blades a tiny bit off of the floor. Avoid raising your body higher than that, because it can put too much stress on your low  back. 6. Slowly lower your chest and your  head to the floor. Back lifts Do these steps 5-10 times in a row: 1. Lie on your belly (face-down) with your arms at your sides, and rest your forehead on the floor. 2. Tighten the muscles in your legs and your butt. 3. Slowly lift your chest off of the floor while you keep your hips on the floor. Keep the back of your head in line with the curve in your back. Look at the floor while you do this. 4. Stay in this position for 3-5 seconds. 5. Slowly lower your chest and your face to the floor. Contact a doctor if:  Your back pain gets a lot worse when you do an exercise.  Your back pain does not get better 2 hours after you exercise. If you have any of these problems, stop doing the exercises. Do not do them again unless your doctor says it is okay. Get help right away if:  You have sudden, very bad back pain. If this happens, stop doing the exercises. Do not do them again unless your doctor says it is okay. This information is not intended to replace advice given to you by your health care provider. Make sure you discuss any questions you have with your health care provider. Document Revised: 02/06/2018 Document Reviewed: 02/06/2018 Elsevier Patient Education  [place] Instructions: Return in about 4 weeks (around 12/10/2019).   Orders:  No orders of the defined types were placed in this encounter.  Meds ordered this encounter  Medications  . DISCONTD: ibuprofen (ADVIL) 400 MG tablet    Sig: Take 1 tablet (400 mg total) by mouth every 8 (eight) hours as needed for fever or moderate pain.    Dispense:  270 tablet    Refill:  4  . DISCONTD: diclofenac Sodium (VOLTAREN) 1 % GEL    Sig: Apply 4 g topically 4 (four) times daily.    Dispense:  350 g    Refill:  3  . diclofenac Sodium (VOLTAREN) 1 % GEL    Sig: Apply 4 g topically 4 (four) times daily.    Dispense:  350 g    Refill:  3  . ibuprofen (ADVIL) 400 MG tablet    Sig: Take 1 tablet (400 mg total) by  mouth every 8 (eight) hours as needed for fever or moderate pain.    Dispense:  270 tablet    Refill:  4      Procedures: No procedures performed   Clinical Data: Findings:  Review of previous radiographs of the lumbar spine shows DDD L2-3,L3-4 and L4-5 levels. Disc height at L1-2 and L5-S1 is well maintained. No anterolisthesis with flexion and extension. No acute changes. Facet arthrosis at the L4-5 and L5-S1 levels    Subjective: Chief Complaint  Patient presents with  . Lower Back - Follow-up  . Right Knee - Follow-up  . Left Knee - Follow-up    74 year old male seen prevously with bilateral knee osteoarthritis changes, started on ibuprofen and transdermal voltaren gel with good Improvement in pain both knees. He is exercising more regularly, walking more and is noticing about 80 5 improvement in his pain pattern. Main concern today is low back pain that is present with sitting, bending stooping and twisting and lifting. No bowel or bladder difficulty. Pain is better with lying down. Back pain is in the left mid lumbar spine and there is no leg pain, numbness  or weakness. His son indicates he is  Planning to travel out of the country for a trip for 3 months and wishes Rx for voltaren gel and for the ibuprofen.    Review of Systems  Constitutional: Negative.   HENT: Negative.   Eyes: Negative.   Respiratory: Negative.   Cardiovascular: Negative.   Gastrointestinal: Negative.   Endocrine: Negative.   Genitourinary: Negative.   Musculoskeletal: Positive for arthralgias, back pain and joint swelling. Negative for gait problem, myalgias, neck pain and neck stiffness.  Skin: Negative.   Allergic/Immunologic: Negative.   Neurological: Negative.  Negative for dizziness, facial asymmetry, weakness, light-headedness, numbness and headaches.  Hematological: Negative.   Psychiatric/Behavioral: Negative.      Objective: Vital Signs: BP 125/71 (BP Location: Left Arm, Patient  Position: Sitting)   Pulse 76   Ht 5\' 10"  (1.778 m)   Wt 187 lb (84.8 kg)   BMI 26.83 kg/m   Physical Exam Constitutional:      Appearance: He is well-developed.  HENT:     Head: Normocephalic and atraumatic.  Eyes:     Pupils: Pupils are equal, round, and reactive to light.  Pulmonary:     Effort: Pulmonary effort is normal.     Breath sounds: Normal breath sounds.  Abdominal:     General: Bowel sounds are normal.     Palpations: Abdomen is soft.  Musculoskeletal:     Cervical back: Normal range of motion and neck supple.     Lumbar back: Positive right straight leg raise test and positive left straight leg raise test.  Skin:    General: Skin is warm and dry.  Neurological:     Mental Status: He is alert and oriented to person, place, and time.  Psychiatric:        Behavior: Behavior normal.        Thought Content: Thought content normal.        Judgment: Judgment normal.     Right Knee Exam  Right knee exam is normal.  Range of Motion  Extension: normal  Flexion: normal   Tests  Lachman:  Anterior - negative    Posterior - negative Drawer:  Anterior - negative    Posterior - negative  Other  Erythema: absent Scars: absent Swelling: none   Left Knee Exam  Left knee exam is normal.  Range of Motion  Extension: normal  Flexion: normal   Tests  Lachman:  Anterior - negative    Posterior - negative Drawer:  Posterior - negative  Other  Erythema: absent Swelling: none   Back Exam   Tenderness  The patient is experiencing tenderness in the lumbar.  Muscle Strength  Right Quadriceps:  5/5  Left Quadriceps:  5/5  Right Hamstrings:  5/5  Left Hamstrings:  5/5   Tests  Straight leg raise right: positive Straight leg raise left: positive  Reflexes  Patellar: 2/4 Achilles: 2/4  Other  Toe walk: normal Heel walk: normal Erythema: no back redness Scars: absent      Specialty Comments:  No specialty comments available.  Imaging: No  results found.   PMFS History: Patient Active Problem List   Diagnosis Date Noted  . Pure hypercholesterolemia 11/18/2018  . Testicular pain, right 11/18/2018  . Dysuria 09/23/2018  . Acute renal failure superimposed on stage 2 chronic kidney disease ([place])   . Chest pain 08/20/2018  . Gastroesophageal reflux disease 10/08/2016  . Glaucoma (increased eye pressure) 12/16/2014  . BPH (benign prostatic hyperplasia) 11/24/2014  .  DJD (degenerative joint disease) of knee 11/24/2014  . Chronic obstructive airway disease with asthma ([place]) 10/11/2014  . Essential hypertension 09/22/2014  . Open angle with borderline findings and high glaucoma risk in left eye 08/24/2014  . After cataract 02/10/2013  . Lumbar radiculopathy 12/30/2012  . Pseudophakia of both eyes 07/29/2012  . Corneal transplant failure 07/15/2012   Past Medical History:  Diagnosis Date  . Asthma    As child   . BPH (benign prostatic hyperplasia)   . COVID-19 09/19/2018  . COVID-19 virus infection 09/18/2018  . Enlarged prostate Dx 2001  . Hypertension    Dx at age 15  . Lumbar radiculopathy   . Open angle with borderline findings and high glaucoma risk in left eye   . Primary open angle glaucoma of right eye, severe stage     Family History  Problem Relation Age of Onset  . Diabetes Mother   . Asthma Father   . Hypertension Father   . Asthma Sister     Past Surgical History:  Procedure Laterality Date  . CATARACT EXTRACTION W/ INTRAOCULAR LENS  IMPLANT, BILATERAL Bilateral   . CORNEAL TRANSPLANT Right   . EYE SURGERY     Social History   Occupational History  . Not on file  Tobacco Use  . Smoking status: Former Smoker    Types: Cigarettes    Quit date: 09/21/1968    Years since quitting: 51.1  . Smokeless tobacco: Never Used  . Tobacco comment: "quit smoking cigarettes in 1970"  Vaping Use  . Vaping Use: Never used  Substance and Sexual Activity  . Alcohol use: No  . Drug use: No  . Sexual  activity: Not on file

## 2019-11-13 ENCOUNTER — Other Ambulatory Visit: Payer: Self-pay | Admitting: Critical Care Medicine

## 2019-11-13 DIAGNOSIS — J449 Chronic obstructive pulmonary disease, unspecified: Secondary | ICD-10-CM

## 2019-11-13 DIAGNOSIS — Z03818 Encounter for observation for suspected exposure to other biological agents ruled out: Secondary | ICD-10-CM | POA: Diagnosis not present

## 2020-01-26 ENCOUNTER — Other Ambulatory Visit: Payer: Self-pay | Admitting: Ophthalmology

## 2020-02-18 MED FILL — DICLOFENAC SODIUM 1% GEL: 1 | 6 days supply | Qty: 100 | Fill #0

## 2020-02-18 MED FILL — OMEPRAZOLE 20 MG CAP: 20 | 90 days supply | Qty: 90 | Fill #0

## 2020-02-18 MED FILL — TAMSULOSIN HCL 0.4 MG CAP: 0.4 | 90 days supply | Qty: 90 | Fill #0

## 2020-02-18 MED FILL — FINASTERIDE 5 MG TABLET: 5 | 90 days supply | Qty: 90 | Fill #0

## 2020-02-18 MED FILL — TIMOLOL 0.5% EYE DROPS: 0.5 | 74 days supply | Qty: 20 | Fill #0

## 2020-02-18 MED FILL — LATANOPROST 0.005% EYE DRP: 0.005 | 75 days supply | Qty: 10 | Fill #0

## 2020-02-18 MED FILL — AMLODIPINE BESYLATE 5 MG TA: 5 | 90 days supply | Qty: 90 | Fill #0

## 2020-02-18 MED FILL — LOSARTAN POTASSIUM 100 MG T: 100 | 90 days supply | Qty: 90 | Fill #0

## 2020-02-18 MED FILL — BREZTRI AEROSPHERE 160-9-4.: 160-9-4.8 | 30 days supply | Qty: 11 | Fill #1

## 2020-02-18 MED FILL — DORZOLAMIDE HCL 2% EYE DRP: 2 | 30 days supply | Qty: 10 | Fill #0

## 2020-02-18 MED FILL — FLUOROMETHOLONE 0.1% DROPS: 0.1 | 75 days supply | Qty: 10 | Fill #0

## 2020-02-18 MED FILL — ATORVASTATIN CALCIUM 40 MG: 40 | 90 days supply | Qty: 90 | Fill #0

## 2020-03-14 ENCOUNTER — Ambulatory Visit: Payer: Medicare Other | Admitting: Specialist

## 2020-03-24 DIAGNOSIS — Z0001 Encounter for general adult medical examination with abnormal findings: Secondary | ICD-10-CM | POA: Diagnosis not present

## 2020-03-24 DIAGNOSIS — M5416 Radiculopathy, lumbar region: Secondary | ICD-10-CM | POA: Diagnosis not present

## 2020-03-24 DIAGNOSIS — N400 Enlarged prostate without lower urinary tract symptoms: Secondary | ICD-10-CM | POA: Diagnosis not present

## 2020-03-24 DIAGNOSIS — R7301 Impaired fasting glucose: Secondary | ICD-10-CM | POA: Diagnosis not present

## 2020-03-24 DIAGNOSIS — E559 Vitamin D deficiency, unspecified: Secondary | ICD-10-CM | POA: Diagnosis not present

## 2020-03-24 DIAGNOSIS — I10 Essential (primary) hypertension: Secondary | ICD-10-CM | POA: Diagnosis not present

## 2020-03-24 DIAGNOSIS — H409 Unspecified glaucoma: Secondary | ICD-10-CM | POA: Diagnosis not present

## 2020-03-24 DIAGNOSIS — J4541 Moderate persistent asthma with (acute) exacerbation: Secondary | ICD-10-CM | POA: Diagnosis not present

## 2020-03-24 DIAGNOSIS — J45909 Unspecified asthma, uncomplicated: Secondary | ICD-10-CM | POA: Diagnosis not present

## 2020-03-24 DIAGNOSIS — Z947 Corneal transplant status: Secondary | ICD-10-CM | POA: Diagnosis not present

## 2020-03-24 DIAGNOSIS — E785 Hyperlipidemia, unspecified: Secondary | ICD-10-CM | POA: Diagnosis not present

## 2020-03-26 ENCOUNTER — Other Ambulatory Visit: Payer: Self-pay | Admitting: Family Medicine

## 2020-04-07 ENCOUNTER — Encounter: Payer: Self-pay | Admitting: Surgery

## 2020-04-07 ENCOUNTER — Ambulatory Visit (INDEPENDENT_AMBULATORY_CARE_PROVIDER_SITE_OTHER): Payer: Medicare Other | Admitting: Surgery

## 2020-04-07 DIAGNOSIS — M1712 Unilateral primary osteoarthritis, left knee: Secondary | ICD-10-CM | POA: Diagnosis not present

## 2020-04-07 DIAGNOSIS — R29898 Other symptoms and signs involving the musculoskeletal system: Secondary | ICD-10-CM

## 2020-04-07 DIAGNOSIS — M47816 Spondylosis without myelopathy or radiculopathy, lumbar region: Secondary | ICD-10-CM | POA: Diagnosis not present

## 2020-04-07 NOTE — Progress Notes (Signed)
Office Visit Note   Patient: Bryan Sullivan           Date of Birth: 08/04/1945           MRN: [id number] Visit Date: 04/07/2020              Requested by: Gildardo Pounds, NP [place],  [place] [address] PCP: Gildardo Pounds, NP   Assessment & Plan: Visit Diagnoses:  1. Spondylosis without myelopathy or radiculopathy, lumbar region   2. Weakness of left leg   3. Unilateral primary osteoarthritis, left knee     Plan: With patient's ongoing chronic pain in his low back that is failed conservative treatment I recommend getting a lumbar MRI to rule out HNP/stenosis.  He has not had a scan in the past.  Follow-up with Dr. Louanne Skye after completion to discuss results and further treatment options.  Regards to his left knee pain I did offer intra-articular Marcaine/Depo-Medrol injection today but patient declined.  States that he would like to think about it.  I do not recommend patient taking any oral NSAIDs with his history of kidney disease.  Also do not recommend him using Voltaren gel when he asked about this.  He will let Dr. Louanne Skye know if he wants to proceed with the injection when he returns for his MRI review.  Follow-Up Instructions: Return in about 4 weeks (around 05/05/2020) for With Dr. Louanne Skye to review lumbar MRI and possible injection of left knee.   Orders:  No orders of the defined types were placed in this encounter.  No orders of the defined types were placed in this encounter.     Procedures: No procedures performed   Clinical Data: No additional findings.   Subjective: Chief Complaint  Patient presents with  . Left Knee - Pain, Follow-up  . Right Knee - Pain, Follow-up  . Lower Back - Pain, Follow-up    HPI 74 year old male comes in today with the help of an interpreter.  Following up for low back pain and chronic left knee pain.  States that both areas are problematic.  I do not see where he is had MRI of the lumbar spine.  He is also not  had conservative treatment of his left knee pain with injections.  States that he continues have ongoing low back pain with activity and the same for his knee.  Patient has history of stage II chronic kidney disease. Review of Systems No current cardiac pulmonary GI GU issues  Objective: Vital Signs: There were no vitals taken for this visit.  Physical Exam Constitutional:      General: He is not in acute distress. HENT:     Head: Normocephalic and atraumatic.  Eyes:     Extraocular Movements: Extraocular movements intact.     Pupils: Pupils are equal, round, and reactive to light.  Pulmonary:     Effort: Pulmonary effort is normal. No respiratory distress.  Musculoskeletal:     Comments: Negative logroll bilateral hips.  Negative straight leg raise.  Left knee good range of motion.  Positive patellofemoral crepitus.  He is moderately tender at the medial joint line.  Nontender lateral.  Calf tender.  Neuro vas intact.  Neurological:     General: No focal deficit present.     Mental Status: He is alert and oriented to person, place, and time.  Psychiatric:        Mood and Affect: Mood normal.     Ortho Exam  Specialty Comments:  No specialty comments available.  Imaging: No results found.   PMFS History: Patient Active Problem List   Diagnosis Date Noted  . Pure hypercholesterolemia 11/18/2018  . Testicular pain, right 11/18/2018  . Dysuria 09/23/2018  . Acute renal failure superimposed on stage 2 chronic kidney disease ([place])   . Chest pain 08/20/2018  . Gastroesophageal reflux disease 10/08/2016  . Glaucoma (increased eye pressure) 12/16/2014  . BPH (benign prostatic hyperplasia) 11/24/2014  . DJD (degenerative joint disease) of knee 11/24/2014  . Chronic obstructive airway disease with asthma ([place]) 10/11/2014  . Essential hypertension 09/22/2014  . Open angle with borderline findings and high glaucoma risk in left eye 08/24/2014  . After cataract 02/10/2013  .  Lumbar radiculopathy 12/30/2012  . Pseudophakia of both eyes 07/29/2012  . Corneal transplant failure 07/15/2012   Past Medical History:  Diagnosis Date  . Asthma    As child   . BPH (benign prostatic hyperplasia)   . COVID-19 09/19/2018  . COVID-19 virus infection 09/18/2018  . Enlarged prostate Dx 2001  . Hypertension    Dx at age 21  . Lumbar radiculopathy   . Open angle with borderline findings and high glaucoma risk in left eye   . Primary open angle glaucoma of right eye, severe stage     Family History  Problem Relation Age of Onset  . Diabetes Mother   . Asthma Father   . Hypertension Father   . Asthma Sister     Past Surgical History:  Procedure Laterality Date  . CATARACT EXTRACTION W/ INTRAOCULAR LENS  IMPLANT, BILATERAL Bilateral   . CORNEAL TRANSPLANT Right   . EYE SURGERY     Social History   Occupational History  . Not on file  Tobacco Use  . Smoking status: Former Smoker    Types: Cigarettes    Quit date: 09/21/1968    Years since quitting: 51.5  . Smokeless tobacco: Never Used  . Tobacco comment: "quit smoking cigarettes in 1970"  Vaping Use  . Vaping Use: Never used  Substance and Sexual Activity  . Alcohol use: No  . Drug use: No  . Sexual activity: Not on file

## 2020-04-18 ENCOUNTER — Other Ambulatory Visit: Payer: Self-pay | Admitting: Family Medicine

## 2020-04-18 DIAGNOSIS — H40023 Open angle with borderline findings, high risk, bilateral: Secondary | ICD-10-CM | POA: Diagnosis not present

## 2020-04-18 DIAGNOSIS — H524 Presbyopia: Secondary | ICD-10-CM | POA: Diagnosis not present

## 2020-04-18 DIAGNOSIS — H5212 Myopia, left eye: Secondary | ICD-10-CM | POA: Diagnosis not present

## 2020-04-18 DIAGNOSIS — H52202 Unspecified astigmatism, left eye: Secondary | ICD-10-CM | POA: Diagnosis not present

## 2020-04-18 DIAGNOSIS — Z961 Presence of intraocular lens: Secondary | ICD-10-CM | POA: Diagnosis not present

## 2020-04-18 DIAGNOSIS — Z947 Corneal transplant status: Secondary | ICD-10-CM | POA: Diagnosis not present

## 2020-04-18 LAB — HM DIABETES EYE EXAM

## 2020-04-18 MED FILL — BREZTRI AEROSPHERE 160-9-4.: 160-9-4.8 | 30 days supply | Qty: 11 | Fill #0

## 2020-04-18 MED FILL — DICLOFENAC SODIUM 1% GEL: 1 | 6 days supply | Qty: 100 | Fill #1

## 2020-04-18 MED FILL — DORZOLAMIDE HCL 2% EYE DRP: 2 | 30 days supply | Qty: 10 | Fill #1

## 2020-05-02 DIAGNOSIS — N400 Enlarged prostate without lower urinary tract symptoms: Secondary | ICD-10-CM | POA: Diagnosis not present

## 2020-05-02 DIAGNOSIS — Z0001 Encounter for general adult medical examination with abnormal findings: Secondary | ICD-10-CM | POA: Diagnosis not present

## 2020-05-02 DIAGNOSIS — H409 Unspecified glaucoma: Secondary | ICD-10-CM | POA: Diagnosis not present

## 2020-05-02 DIAGNOSIS — I10 Essential (primary) hypertension: Secondary | ICD-10-CM | POA: Diagnosis not present

## 2020-06-10 ENCOUNTER — Other Ambulatory Visit: Payer: Self-pay | Admitting: Cardiology

## 2020-06-10 DIAGNOSIS — I10 Essential (primary) hypertension: Secondary | ICD-10-CM | POA: Diagnosis not present

## 2020-06-10 DIAGNOSIS — R0602 Shortness of breath: Secondary | ICD-10-CM | POA: Diagnosis not present

## 2020-06-10 DIAGNOSIS — I2510 Atherosclerotic heart disease of native coronary artery without angina pectoris: Secondary | ICD-10-CM | POA: Insufficient documentation

## 2020-06-10 DIAGNOSIS — R0789 Other chest pain: Secondary | ICD-10-CM | POA: Diagnosis not present

## 2020-06-10 MED FILL — ISOSORBIDE MN ER 30 MG TAB: 30 | 30 days supply | Qty: 30 | Fill #0

## 2020-06-10 MED FILL — FAMOTIDINE 20 MG TABS: 20 | 30 days supply | Qty: 60 | Fill #0

## 2020-06-14 ENCOUNTER — Other Ambulatory Visit: Payer: Self-pay | Admitting: Specialist

## 2020-06-14 ENCOUNTER — Other Ambulatory Visit: Payer: Self-pay | Admitting: Critical Care Medicine

## 2020-06-14 DIAGNOSIS — J449 Chronic obstructive pulmonary disease, unspecified: Secondary | ICD-10-CM

## 2020-06-14 MED FILL — DICLOFENAC SODIUM 1% GEL: 1 | 6 days supply | Qty: 100 | Fill #2

## 2020-06-14 MED FILL — DORZOLAMIDE HCL 2% EYE DRP: 2 | 30 days supply | Qty: 10 | Fill #2

## 2020-06-14 MED FILL — FLUOROMETHOLONE 0.1% DROPS: 0.1 | 75 days supply | Qty: 10 | Fill #1

## 2020-06-14 MED FILL — LOSARTAN POTASSIUM 100 MG T: 100 | 90 days supply | Qty: 90 | Fill #0

## 2020-06-14 MED FILL — OMEPRAZOLE 20 MG CAP: 20 | 90 days supply | Qty: 90 | Fill #0

## 2020-06-14 MED FILL — LATANOPROST 0.005% EYE DRP: 0.005 | 74 days supply | Qty: 10 | Fill #1

## 2020-06-14 MED FILL — PROAIR HFA 90 MCG INHALER: 108 (90 BAS | 25 days supply | Qty: 9 | Fill #0

## 2020-06-14 MED FILL — AMLODIPINE BESYLATE 5 MG TA: 5 | 90 days supply | Qty: 90 | Fill #0

## 2020-06-14 MED FILL — FINASTERIDE 5 MG TABLET: 5 | 90 days supply | Qty: 90 | Fill #0

## 2020-06-14 MED FILL — BREZTRI AEROSPHERE 160-9-4.: 160-9-4.8 | 30 days supply | Qty: 11 | Fill #0

## 2020-06-14 MED FILL — IBUPROFEN 600 MG TABLET: 600 | 30 days supply | Qty: 120 | Fill #0

## 2020-06-14 MED FILL — TIMOLOL 0.5% EYE DROPS: 0.5 | 74 days supply | Qty: 20 | Fill #1

## 2020-06-14 MED FILL — ATORVASTATIN CALCIUM 40 MG: 40 | 90 days supply | Qty: 90 | Fill #0

## 2020-06-14 MED FILL — TAMSULOSIN HCL 0.4 MG CAP: 0.4 | 90 days supply | Qty: 90 | Fill #0

## 2020-06-14 NOTE — Telephone Encounter (Signed)
Requested medications are due for refill today.  Yes  Requested medications are on the active medications list.  yes  Last refill. 03/18/2019  Future visit scheduled.   No.  Notes to clinic.  Pt has not been seen since 10/2018. Called Pt using [employer]. Unable to [facility] as mailbox was full.

## 2020-06-15 DIAGNOSIS — I10 Essential (primary) hypertension: Secondary | ICD-10-CM | POA: Diagnosis not present

## 2020-06-15 DIAGNOSIS — E119 Type 2 diabetes mellitus without complications: Secondary | ICD-10-CM | POA: Diagnosis not present

## 2020-06-15 DIAGNOSIS — E786 Lipoprotein deficiency: Secondary | ICD-10-CM | POA: Diagnosis not present

## 2020-06-15 DIAGNOSIS — N400 Enlarged prostate without lower urinary tract symptoms: Secondary | ICD-10-CM | POA: Diagnosis not present

## 2020-06-15 DIAGNOSIS — H409 Unspecified glaucoma: Secondary | ICD-10-CM | POA: Diagnosis not present

## 2020-06-27 ENCOUNTER — Other Ambulatory Visit: Payer: Self-pay | Admitting: Family Medicine

## 2020-06-27 DIAGNOSIS — E785 Hyperlipidemia, unspecified: Secondary | ICD-10-CM | POA: Diagnosis not present

## 2020-06-27 DIAGNOSIS — J4541 Moderate persistent asthma with (acute) exacerbation: Secondary | ICD-10-CM | POA: Diagnosis not present

## 2020-06-27 DIAGNOSIS — H409 Unspecified glaucoma: Secondary | ICD-10-CM | POA: Diagnosis not present

## 2020-06-27 DIAGNOSIS — E119 Type 2 diabetes mellitus without complications: Secondary | ICD-10-CM | POA: Diagnosis not present

## 2020-06-27 DIAGNOSIS — E559 Vitamin D deficiency, unspecified: Secondary | ICD-10-CM | POA: Diagnosis not present

## 2020-06-27 DIAGNOSIS — N400 Enlarged prostate without lower urinary tract symptoms: Secondary | ICD-10-CM | POA: Diagnosis not present

## 2020-06-27 DIAGNOSIS — I10 Essential (primary) hypertension: Secondary | ICD-10-CM | POA: Diagnosis not present

## 2020-07-08 DIAGNOSIS — I080 Rheumatic disorders of both mitral and aortic valves: Secondary | ICD-10-CM | POA: Diagnosis not present

## 2020-07-08 DIAGNOSIS — I5189 Other ill-defined heart diseases: Secondary | ICD-10-CM | POA: Diagnosis not present

## 2020-07-12 MED FILL — IBUPROFEN 600 MG TABLET: 600 | 30 days supply | Qty: 120 | Fill #1

## 2020-07-12 MED FILL — ISOSORBIDE MN ER 30 MG TAB: 30 | 30 days supply | Qty: 30 | Fill #1

## 2020-07-12 MED FILL — DICLOFENAC SODIUM 1% GEL: 1 | 6 days supply | Qty: 100 | Fill #3

## 2020-07-12 MED FILL — FAMOTIDINE 20 MG TABS: 20 | 30 days supply | Qty: 60 | Fill #1

## 2020-07-25 DIAGNOSIS — Z947 Corneal transplant status: Secondary | ICD-10-CM | POA: Diagnosis not present

## 2020-07-25 DIAGNOSIS — H40023 Open angle with borderline findings, high risk, bilateral: Secondary | ICD-10-CM | POA: Diagnosis not present

## 2020-07-25 DIAGNOSIS — Z961 Presence of intraocular lens: Secondary | ICD-10-CM | POA: Diagnosis not present

## 2020-07-25 DIAGNOSIS — H04123 Dry eye syndrome of bilateral lacrimal glands: Secondary | ICD-10-CM | POA: Diagnosis not present

## 2020-07-25 LAB — HM DIABETES EYE EXAM

## 2020-08-26 MED FILL — ADVAIR 250/50 DISKUS: 250-50 | 30 days supply | Qty: 60 | Fill #0

## 2020-08-26 MED FILL — DICLOFENAC SODIUM 1% GEL: 1 | 6 days supply | Qty: 100 | Fill #5

## 2020-08-26 MED FILL — DORZOLAMIDE HCL 2% EYE DRP: 2 | 30 days supply | Qty: 10 | Fill #3

## 2020-08-26 MED FILL — PROAIR HFA 90 MCG INHALER: 108 (90 BAS | 48 days supply | Qty: 26 | Fill #0

## 2020-08-26 MED FILL — LATANOPROST 0.005% EYE DRP: 0.005 | 74 days supply | Qty: 10 | Fill #2

## 2020-08-28 ENCOUNTER — Other Ambulatory Visit: Payer: Self-pay

## 2020-08-28 MED FILL — Budesonide-Glycopyrrolate-Formoterol Aers 160-9-4.8 MCG/ACT: RESPIRATORY_TRACT | 30 days supply | Qty: 10.7 | Fill #0 | Status: CN

## 2020-08-28 MED FILL — Fluorometholone Ophth Susp 0.1%: OPHTHALMIC | 75 days supply | Qty: 10 | Fill #0 | Status: CN

## 2020-08-28 MED FILL — Timolol Maleate Ophth Soln 0.5%: OPHTHALMIC | 90 days supply | Qty: 20 | Fill #0 | Status: CN

## 2020-08-29 ENCOUNTER — Other Ambulatory Visit: Payer: Self-pay

## 2020-08-29 MED FILL — Budesonide-Glycopyrrolate-Formoterol Aers 160-9-4.8 MCG/ACT: RESPIRATORY_TRACT | 30 days supply | Qty: 10.7 | Fill #0 | Status: CN

## 2020-08-29 MED FILL — Timolol Maleate Ophth Soln 0.5%: OPHTHALMIC | 90 days supply | Qty: 20 | Fill #0 | Status: AC

## 2020-08-29 MED FILL — Fluorometholone Ophth Susp 0.1%: OPHTHALMIC | 75 days supply | Qty: 10 | Fill #0 | Status: AC

## 2020-08-30 ENCOUNTER — Other Ambulatory Visit: Payer: Self-pay

## 2020-09-07 ENCOUNTER — Other Ambulatory Visit: Payer: Self-pay

## 2020-09-14 ENCOUNTER — Other Ambulatory Visit: Payer: Self-pay

## 2020-09-14 ENCOUNTER — Other Ambulatory Visit: Payer: Self-pay | Admitting: Nurse Practitioner

## 2020-09-14 MED ORDER — ALBUTEROL SULFATE HFA 108 (90 BASE) MCG/ACT IN AERS
INHALATION_SPRAY | RESPIRATORY_TRACT | 0 refills | Status: DC
  Filled 2020-09-14: qty 25.5, 48d supply, fill #0

## 2020-09-14 MED ORDER — LOSARTAN POTASSIUM 100 MG PO TABS
ORAL_TABLET | Freq: Every day | ORAL | 0 refills | Status: DC
  Filled 2020-09-14: qty 90, 90d supply, fill #0

## 2020-09-14 MED ORDER — TAMSULOSIN HCL 0.4 MG PO CAPS
ORAL_CAPSULE | ORAL | 0 refills | Status: DC
  Filled 2020-09-14: qty 90, 90d supply, fill #0

## 2020-09-14 MED ORDER — AMLODIPINE BESYLATE 5 MG PO TABS
ORAL_TABLET | ORAL | 0 refills | Status: DC
  Filled 2020-09-14: qty 90, 90d supply, fill #0

## 2020-09-14 MED ORDER — FINASTERIDE 5 MG PO TABS
ORAL_TABLET | ORAL | 0 refills | Status: DC
  Filled 2020-09-14: qty 90, 90d supply, fill #0

## 2020-09-14 MED ORDER — ATORVASTATIN CALCIUM 40 MG PO TABS
ORAL_TABLET | ORAL | 0 refills | Status: DC
  Filled 2020-09-14: qty 90, 90d supply, fill #0

## 2020-09-14 MED FILL — Isosorbide Mononitrate Tab ER 24HR 30 MG: ORAL | 30 days supply | Qty: 30 | Fill #0 | Status: AC

## 2020-09-14 MED FILL — Diclofenac Sodium Gel 1% (1.16% Diethylamine Equiv): CUTANEOUS | 7 days supply | Qty: 100 | Fill #0 | Status: AC

## 2020-09-14 MED FILL — Budesonide-Glycopyrrolate-Formoterol Aers 160-9-4.8 MCG/ACT: RESPIRATORY_TRACT | 30 days supply | Qty: 10.7 | Fill #0 | Status: AC

## 2020-09-14 NOTE — Telephone Encounter (Signed)
Notes to clinic: medication last filled by a different provider  Review for refill  Overdue for appt  Requested Prescriptions  Pending Prescriptions Disp Refills   tamsulosin (FLOMAX) 0.4 MG CAPS capsule 90 capsule 0    Sig: TAKE 1 CAPSULE (0.4 MG) BY MOUTH DAILY      Urology: Alpha-Adrenergic Blocker Failed - 09/14/2020  2:45 PM      Failed - Valid encounter within last 12 months    Recent Outpatient Visits           1 year ago Chronic obstructive airway disease with asthma ([place])   [facility] Elsie Stain, MD   1 year ago [place] Elsie Stain, MD   1 year ago COVID-19 virus infection   [place] Elsie Stain, MD   1 year ago COVID-19 virus infection   [place] Elsie Stain, MD   2 years ago Chronic obstructive airway disease with asthma [facility])   [place] Elsie Stain, MD                Passed - Last BP in normal range    BP Readings from Last 1 Encounters:  11/12/19 125/71            losartan (COZAAR) 100 MG tablet 90 tablet 0    Sig: TAKE ONE TABLET BY MOUTH DAILY      Cardiovascular:  Angiotensin Receptor Blockers Failed - 09/14/2020  2:45 PM      Failed - Cr in normal range and within 180 days    Creat  Date Value Ref Range Status  10/11/2014 1.04 0.50 - 1.35 mg/dL Final   Creatinine, Ser  Date Value Ref Range Status  11/18/2018 1.23 0.76 - 1.27 mg/dL Final   Creatinine, Urine  Date Value Ref Range Status  06/22/2015 124.67 mg/dL Final          Failed - K in normal range and within 180 days    Potassium  Date Value Ref Range Status  11/18/2018 4.2 3.5 - 5.2 mmol/L Final          Failed - Valid encounter within last 6 months    Recent Outpatient Visits           1 year ago Chronic obstructive airway disease with asthma ([place])   [facility] Elsie St[facility], [facility]   1 year ago [place] Elsie Stain, MD   1 year ago COVID-19 virus infection   [place] Elsie Stain, MD   1 year ago COVID-19 virus infection   [place] Elsie Stain, MD   2 years ago Chronic obstructive airway disease with asthma [facility])   [place] Elsie Stain, MD                Passed - Patient is not pregnant      Passed - Last BP in normal range    BP Readings from Last 1 Encounters:  11/12/19 125/71            finasteride (PROSCAR) 5 MG tablet 90 tablet 0    Sig: TAKE 1 TABLET (5 MG) BY MOUTH DAILY      Urology: 5-alpha  Reductase Inhibitors Failed - 09/14/2020  2:45 PM      Failed - Valid encounter within last 12 months    Recent Outpatient Visits           1 year ago Chronic obstructive airway disease with asthma ([place])   [facility] Elsie Stain, MD   1 year ago [place] Elsie Stain, MD   1 year ago COVID-19 virus infection   [place] Elsie Stain, MD   1 year ago COVID-19 virus infection   [place] Elsie Stain, MD   2 years ago Chronic obstructive airway disease with asthma [facility])   [place] Elsie Stain, MD                  atorvastatin (LIPITOR) 40 MG tablet 90 tablet 0    Sig: TAKE 1 TABLET (40 MG) BY MOUTH DAILY      Cardiovascular:  Antilipid - Statins Failed - 09/14/2020  2:45 PM      Failed - Total Cholesterol in normal range and within 360 days    Cholesterol, Total  Date Value Ref Range Status  11/18/2018 105 100 - 199 mg/dL Final          Failed - LDL in normal range and within 360 days    LDL Calculated  Date Value Ref Range  Status  11/18/2018 51 0 - 99 mg/dL Final          Failed - HDL in normal range and within 360 days    HDL  Date Value Ref Range Status  11/18/2018 29 (L) >39 mg/dL Final          Failed - Triglycerides in normal range and within 360 days    Triglycerides  Date Value Ref Range Status  11/18/2018 124 0 - 149 mg/dL Final          Failed - Valid encounter within last 12 months    Recent Outpatient Visits           1 year ago Chronic obstructive airway disease with asthma ([place])   [place] Elsie Stain, MD   1 year ago [place] Elsie Stain, MD   1 year ago COVID-19 virus infection   [place] Elsie Stain, MD   1 year ago COVID-19 virus infection   [place] Elsie Stain, MD   2 years ago Chronic obstructive airway disease with asthma [facility])   [place], Patrick E, MD                Passed - Patient is not pregnant        amLODipine (NORVASC) 5 MG tablet 90 tablet 0    Sig: TAKE 1 TABLET (5 MG) BY MOUTH DAILY      Cardiovascular:  Calcium Channel Blockers Failed - 09/14/2020  2:45 PM      Failed - Valid encounter within last 6 months    Recent Outpatient Visits           1 year ago Chronic obstructive airway disease with asthma [facility])   [facility] Elsie Stain, MD   1 year ago COVID-53  [place] Elsie Stain, MD   1 year ago COVID-19 virus infection   [place] Elsie Stain, MD   1 year ago COVID-19 virus infection   [place] Elsie Stain, MD   2 years ago Chronic obstructive airway disease with asthma [facility])   [place] Elsie Stain, MD                Passed - Last BP in  normal range    BP Readings from Last 1 Encounters:  11/12/19 125/71

## 2020-09-15 ENCOUNTER — Other Ambulatory Visit: Payer: Self-pay

## 2020-09-20 ENCOUNTER — Other Ambulatory Visit: Payer: Self-pay

## 2020-09-20 ENCOUNTER — Other Ambulatory Visit: Payer: Self-pay | Admitting: Family Medicine

## 2020-09-20 DIAGNOSIS — J449 Chronic obstructive pulmonary disease, unspecified: Secondary | ICD-10-CM

## 2020-09-20 DIAGNOSIS — J4489 Other specified chronic obstructive pulmonary disease: Secondary | ICD-10-CM

## 2020-09-20 MED ORDER — FINASTERIDE 5 MG PO TABS
ORAL_TABLET | ORAL | 0 refills | Status: DC
  Filled 2020-09-20: qty 30, 30d supply, fill #0

## 2020-09-20 MED ORDER — FLUTICASONE-SALMETEROL 250-50 MCG/DOSE IN AEPB
INHALATION_SPRAY | RESPIRATORY_TRACT | 2 refills | Status: DC
  Filled 2020-09-20: qty 60, 30d supply, fill #0

## 2020-09-20 MED ORDER — ALBUTEROL SULFATE HFA 108 (90 BASE) MCG/ACT IN AERS
INHALATION_SPRAY | RESPIRATORY_TRACT | 0 refills | Status: DC
  Filled 2020-09-20: qty 8.5, 16d supply, fill #0
  Filled 2020-09-20: qty 18, 25d supply, fill #0

## 2020-09-20 MED ORDER — OMEPRAZOLE 20 MG PO CPDR
DELAYED_RELEASE_CAPSULE | Freq: Once | ORAL | 0 refills | Status: DC
  Filled 2020-09-20: qty 30, 30d supply, fill #0

## 2020-09-20 NOTE — Telephone Encounter (Signed)
Note for med refill encounter indicates reordered today and sent to [facility] Order history does not reflect this . Request for med to be sent to [facility] pharmacy received by [facility] Nurse Triage. Please review. Previous request was refused.

## 2020-09-22 ENCOUNTER — Other Ambulatory Visit: Payer: Self-pay

## 2020-09-23 ENCOUNTER — Other Ambulatory Visit: Payer: Self-pay

## 2020-09-26 ENCOUNTER — Other Ambulatory Visit: Payer: Self-pay

## 2020-09-26 MED ORDER — FLUTICASONE-SALMETEROL 250-50 MCG/ACT IN AEPB: INHALATION_SPRAY | RESPIRATORY_TRACT | 1 refills | Status: DC

## 2020-09-29 ENCOUNTER — Other Ambulatory Visit: Payer: Self-pay

## 2020-10-12 ENCOUNTER — Encounter (INDEPENDENT_AMBULATORY_CARE_PROVIDER_SITE_OTHER): Payer: Self-pay

## 2020-10-12 ENCOUNTER — Other Ambulatory Visit: Payer: Self-pay

## 2020-10-12 MED FILL — Diclofenac Sodium Gel 1% (1.16% Diethylamine Equiv): CUTANEOUS | 7 days supply | Qty: 100 | Fill #1 | Status: AC

## 2020-10-12 MED FILL — Isosorbide Mononitrate Tab ER 24HR 30 MG: ORAL | 30 days supply | Qty: 30 | Fill #1 | Status: AC

## 2020-10-25 ENCOUNTER — Other Ambulatory Visit: Payer: Self-pay | Admitting: Critical Care Medicine

## 2020-10-25 ENCOUNTER — Other Ambulatory Visit: Payer: Self-pay

## 2020-10-25 DIAGNOSIS — R0602 Shortness of breath: Secondary | ICD-10-CM | POA: Diagnosis not present

## 2020-10-25 DIAGNOSIS — I10 Essential (primary) hypertension: Secondary | ICD-10-CM | POA: Diagnosis not present

## 2020-10-25 DIAGNOSIS — J449 Chronic obstructive pulmonary disease, unspecified: Secondary | ICD-10-CM

## 2020-10-25 NOTE — Telephone Encounter (Signed)
   Notes to clinic:  script requested has expired  Review for continued use and refill    Requested Prescriptions  Pending Prescriptions Disp Refills   albuterol (PROVENTIL) (2.5 MG/3ML) 0.083% nebulizer solution 1080 mL 1    Sig: USE 3 MLS (2.5 MG TOTAL) BY NEBULIZATION EVERY 6 (SIX) HOURS AS NEEDED FOR WHEEZING OR SHORTNESS OF BREATH.      Pulmonology:  Beta Agonists Failed - 10/25/2020 10:30 AM      Failed - One inhaler should last at least one month. If the patient is requesting refills earlier, contact the patient to check for uncontrolled symptoms.      Failed - Valid encounter within last 12 months    Recent Outpatient Visits           1 year ago Chronic obstructive airway disease with asthma [facility])   [place] Elsie Stain, MD   2 years ago [place], MD   2 years ago COVID-7 virus infection   [place], Patrick E, MD   2 years ago COVID-29 virus infection   [place] Elsie Stain, MD   2 years ago Chronic obstructive airway disease with asthma [facility])   [place] Elsie Stain, MD       Future Appointments             Tomorrow Gildardo Pounds, NP [place]   In 1 month Gildardo Pounds, NP [place]

## 2020-10-26 ENCOUNTER — Other Ambulatory Visit: Payer: Self-pay

## 2020-10-26 ENCOUNTER — Telehealth: Payer: Self-pay | Admitting: Nurse Practitioner

## 2020-10-26 ENCOUNTER — Ambulatory Visit: Payer: Medicare Other | Attending: Nurse Practitioner | Admitting: Nurse Practitioner

## 2020-10-26 ENCOUNTER — Other Ambulatory Visit: Payer: Self-pay | Admitting: Nurse Practitioner

## 2020-10-26 DIAGNOSIS — R7989 Other specified abnormal findings of blood chemistry: Secondary | ICD-10-CM

## 2020-10-26 DIAGNOSIS — K219 Gastro-esophageal reflux disease without esophagitis: Secondary | ICD-10-CM

## 2020-10-26 DIAGNOSIS — M546 Pain in thoracic spine: Secondary | ICD-10-CM | POA: Diagnosis not present

## 2020-10-26 DIAGNOSIS — R3914 Feeling of incomplete bladder emptying: Secondary | ICD-10-CM

## 2020-10-26 DIAGNOSIS — I10 Essential (primary) hypertension: Secondary | ICD-10-CM

## 2020-10-26 DIAGNOSIS — H409 Unspecified glaucoma: Secondary | ICD-10-CM

## 2020-10-26 DIAGNOSIS — R7309 Other abnormal glucose: Secondary | ICD-10-CM

## 2020-10-26 DIAGNOSIS — E7800 Pure hypercholesterolemia, unspecified: Secondary | ICD-10-CM

## 2020-10-26 DIAGNOSIS — N401 Enlarged prostate with lower urinary tract symptoms: Secondary | ICD-10-CM

## 2020-10-26 MED ORDER — ATORVASTATIN CALCIUM 40 MG PO TABS
40.0000 mg | ORAL_TABLET | Freq: Every day | ORAL | 3 refills | Status: DC
  Filled 2020-10-26 – 2020-12-15 (×2): qty 90, 90d supply, fill #0

## 2020-10-26 MED ORDER — FLUTICASONE-SALMETEROL 230-21 MCG/ACT IN AERO
2.0000 | INHALATION_SPRAY | Freq: Two times a day (BID) | RESPIRATORY_TRACT | 6 refills | Status: DC
  Filled 2020-10-26: qty 12, 30d supply, fill #0

## 2020-10-26 MED ORDER — LATANOPROST 0.005 % OP SOLN
1.0000 [drp] | Freq: Every day | OPHTHALMIC | 3 refills | Status: DC
  Filled 2020-10-26: qty 5, 38d supply, fill #0

## 2020-10-26 MED ORDER — AMLODIPINE BESYLATE 5 MG PO TABS
5.0000 mg | ORAL_TABLET | Freq: Every day | ORAL | 3 refills | Status: DC
  Filled 2020-10-26: qty 90, 90d supply, fill #0

## 2020-10-26 MED ORDER — METOPROLOL SUCCINATE ER 25 MG PO TB24
ORAL_TABLET | ORAL | 5 refills | Status: DC
  Filled 2020-10-26: qty 30, 30d supply, fill #0
  Filled 2020-12-01: qty 30, 30d supply, fill #1
  Filled 2020-12-19 – 2021-01-05 (×2): qty 30, 30d supply, fill #2
  Filled 2021-02-01: qty 30, 30d supply, fill #3
  Filled 2021-03-03: qty 30, 30d supply, fill #4
  Filled 2021-04-03: qty 30, 30d supply, fill #5

## 2020-10-26 MED ORDER — VALSARTAN 80 MG PO TABS
80.0000 mg | ORAL_TABLET | Freq: Every day | ORAL | 0 refills | Status: DC
  Filled 2020-10-26: qty 90, 90d supply, fill #0

## 2020-10-26 MED ORDER — OMEPRAZOLE 20 MG PO CPDR
20.0000 mg | DELAYED_RELEASE_CAPSULE | Freq: Every day | ORAL | 0 refills | Status: DC
  Filled 2020-10-26: qty 90, 90d supply, fill #0

## 2020-10-26 MED ORDER — DICLOFENAC SODIUM 1 % EX GEL
4.0000 g | Freq: Four times a day (QID) | CUTANEOUS | 3 refills | Status: DC
  Filled 2020-10-26: qty 200, 14d supply, fill #0
  Filled 2021-01-20: qty 200, 14d supply, fill #1
  Filled 2021-02-09: qty 200, 14d supply, fill #2
  Filled 2021-03-03: qty 200, 14d supply, fill #3
  Filled 2021-04-03: qty 200, 14d supply, fill #4
  Filled 2021-04-18: qty 200, 14d supply, fill #5
  Filled 2021-05-31: qty 200, 14d supply, fill #6

## 2020-10-26 MED ORDER — TIMOLOL MALEATE 0.5 % OP SOLN
1.0000 [drp] | Freq: Two times a day (BID) | OPHTHALMIC | 3 refills | Status: DC
  Filled 2020-10-26: qty 20, 200d supply, fill #0
  Filled 2021-01-20: qty 20, 80d supply, fill #0
  Filled 2021-04-24: qty 20, 80d supply, fill #1
  Filled 2021-08-24: qty 20, 80d supply, fill #0
  Filled 2021-09-05: qty 10, 40d supply, fill #0

## 2020-10-26 MED ORDER — FAMOTIDINE 20 MG PO TABS
ORAL_TABLET | ORAL | 0 refills | Status: DC
  Filled 2020-10-26: qty 90, 90d supply, fill #0

## 2020-10-26 MED ORDER — TAMSULOSIN HCL 0.4 MG PO CAPS
0.8000 mg | ORAL_CAPSULE | Freq: Every day | ORAL | 3 refills | Status: DC
  Filled 2020-10-26 – 2020-12-15 (×2): qty 60, 30d supply, fill #0
  Filled 2021-02-14: qty 60, 30d supply, fill #1
  Filled 2021-04-03: qty 60, 30d supply, fill #2
  Filled 2021-05-31: qty 60, 30d supply, fill #3

## 2020-10-26 MED ORDER — FINASTERIDE 5 MG PO TABS
5.0000 mg | ORAL_TABLET | Freq: Every day | ORAL | 6 refills | Status: DC
  Filled 2020-10-26 – 2020-12-15 (×2): qty 30, 30d supply, fill #0
  Filled 2021-01-20: qty 30, 30d supply, fill #1
  Filled 2021-02-14: qty 30, 30d supply, fill #2
  Filled 2021-04-03: qty 30, 30d supply, fill #3
  Filled 2021-05-03: qty 30, 30d supply, fill #4
  Filled 2021-05-31: qty 30, 30d supply, fill #5
  Filled 2021-07-04: qty 30, 30d supply, fill #0

## 2020-10-26 MED ORDER — ASPIRIN 81 MG PO TBEC
81.0000 mg | DELAYED_RELEASE_TABLET | Freq: Every day | ORAL | 0 refills | Status: DC
  Filled 2020-10-26: qty 30, 30d supply, fill #0

## 2020-10-26 NOTE — Progress Notes (Signed)
Patient has an appointment scheduled for July already. Son States does not need this appointment today.

## 2020-10-26 NOTE — Telephone Encounter (Signed)
No answer. LVM with assistance from interpreter

## 2020-10-27 ENCOUNTER — Other Ambulatory Visit: Payer: Self-pay

## 2020-10-27 ENCOUNTER — Telehealth: Payer: Self-pay | Admitting: Nurse Practitioner

## 2020-10-27 ENCOUNTER — Encounter (INDEPENDENT_AMBULATORY_CARE_PROVIDER_SITE_OTHER): Payer: Self-pay

## 2020-10-27 DIAGNOSIS — R7309 Other abnormal glucose: Secondary | ICD-10-CM | POA: Diagnosis not present

## 2020-10-27 DIAGNOSIS — I10 Essential (primary) hypertension: Secondary | ICD-10-CM | POA: Diagnosis not present

## 2020-10-27 DIAGNOSIS — R7989 Other specified abnormal findings of blood chemistry: Secondary | ICD-10-CM | POA: Diagnosis not present

## 2020-10-27 DIAGNOSIS — E7800 Pure hypercholesterolemia, unspecified: Secondary | ICD-10-CM | POA: Diagnosis not present

## 2020-10-27 NOTE — Telephone Encounter (Signed)
PT dropped off written list of Dr's contact info. Pt states he "WANTS PCP to talk to Drs on list" given by pt" to know pt's medications and appts." Pt was asked w/an interpreter on the line if he meant Pcp to view his records so he could sign for record release. Pt was also told that he could give access to share his chart w/those offices on My chart and we could help set up his My Chart. Pt was getting frustrated w/the fact that he needs to give the written approval for this. Pt reiterated he just wants Dr. Raul Del to have the list so she could talk to the Doctors. Pt declined My Chart option to be facilitated.

## 2020-10-28 ENCOUNTER — Telehealth: Payer: Self-pay | Admitting: Nurse Practitioner

## 2020-10-28 ENCOUNTER — Other Ambulatory Visit: Payer: Self-pay

## 2020-10-28 LAB — CBC WITH DIFFERENTIAL/PLATELET
Basophils Absolute: 0 10*3/uL (ref 0.0–0.2)
Basos: 1 %
EOS (ABSOLUTE): 0.1 10*3/uL (ref 0.0–0.4)
Eos: 2 %
Hematocrit: 39.3 % (ref 37.5–51.0)
Hemoglobin: 12.9 g/dL — ABNORMAL LOW (ref 13.0–17.7)
Immature Grans (Abs): 0 10*3/uL (ref 0.0–0.1)
Immature Granulocytes: 0 %
Lymphocytes Absolute: 1.5 10*3/uL (ref 0.7–3.1)
Lymphs: 25 %
MCH: 28.4 pg (ref 26.6–33.0)
MCHC: 32.8 g/dL (ref 31.5–35.7)
MCV: 87 fL (ref 79–97)
Monocytes Absolute: 0.5 10*3/uL (ref 0.1–0.9)
Monocytes: 8 %
Neutrophils Absolute: 4 10*3/uL (ref 1.4–7.0)
Neutrophils: 64 %
Platelets: 256 10*3/uL (ref 150–450)
RBC: 4.54 x10E6/uL (ref 4.14–5.80)
RDW: 12.1 % (ref 11.6–15.4)
WBC: 6.1 10*3/uL (ref 3.4–10.8)

## 2020-10-28 LAB — CMP14+EGFR
ALT: 14 IU/L (ref 0–44)
AST: 16 IU/L (ref 0–40)
Albumin/Globulin Ratio: 1.6 (ref 1.2–2.2)
Albumin: 4.2 g/dL (ref 3.7–4.7)
Alkaline Phosphatase: 72 IU/L (ref 44–121)
BUN/Creatinine Ratio: 13 (ref 10–24)
BUN: 16 mg/dL (ref 8–27)
Bilirubin Total: 0.4 mg/dL (ref 0.0–1.2)
CO2: 25 mmol/L (ref 20–29)
Calcium: 9.4 mg/dL (ref 8.6–10.2)
Chloride: 107 mmol/L — ABNORMAL HIGH (ref 96–106)
Creatinine, Ser: 1.24 mg/dL (ref 0.76–1.27)
Globulin, Total: 2.7 g/dL (ref 1.5–4.5)
Glucose: 141 mg/dL — ABNORMAL HIGH (ref 65–99)
Potassium: 4.4 mmol/L (ref 3.5–5.2)
Sodium: 143 mmol/L (ref 134–144)
Total Protein: 6.9 g/dL (ref 6.0–8.5)
eGFR: 60 mL/min/{1.73_m2} (ref >59)

## 2020-10-28 LAB — LIPID PANEL
Chol/HDL Ratio: 3 ratio (ref 0.0–5.0)
Cholesterol, Total: 90 mg/dL — ABNORMAL LOW (ref 100–199)
HDL: 30 mg/dL — ABNORMAL LOW (ref >39)
LDL Chol Calc (NIH): 42 mg/dL (ref 0–99)
Triglycerides: 88 mg/dL (ref 0–149)
VLDL Cholesterol Cal: 18 mg/dL (ref 5–40)

## 2020-10-28 LAB — HEMOGLOBIN A1C
Est. average glucose Bld gHb Est-mCnc: 134 mg/dL
Hgb A1c MFr Bld: 6.3 % — ABNORMAL HIGH (ref 4.8–5.6)

## 2020-10-28 NOTE — Telephone Encounter (Signed)
Pt went to the pharmacy to pick up  Albuterol Sulfate Inhalation 0.03% but the pharmacists did not see any orders placed in his chart. Pt would like to have this medication. Please follow up with pt regarding this matter.

## 2020-10-31 ENCOUNTER — Other Ambulatory Visit: Payer: Self-pay

## 2020-10-31 NOTE — Telephone Encounter (Signed)
Can this medication be refilled.

## 2020-11-01 NOTE — Telephone Encounter (Signed)
I don't see this on his profile. I think his PCP would need to order this if it's still appropriate for him to take.

## 2020-11-02 ENCOUNTER — Other Ambulatory Visit: Payer: Self-pay

## 2020-11-02 ENCOUNTER — Other Ambulatory Visit: Payer: Self-pay | Admitting: Nurse Practitioner

## 2020-11-02 MED ORDER — ALBUTEROL SULFATE (2.5 MG/3ML) 0.083% IN NEBU
2.5000 mg | INHALATION_SOLUTION | Freq: Four times a day (QID) | RESPIRATORY_TRACT | 1 refills | Status: DC | PRN
  Filled 2020-11-02 – 2020-11-03 (×2): qty 150, 13d supply, fill #0

## 2020-11-02 NOTE — Telephone Encounter (Signed)
Patient is requesting refill on medication.  

## 2020-11-03 ENCOUNTER — Other Ambulatory Visit: Payer: Self-pay

## 2020-11-04 ENCOUNTER — Other Ambulatory Visit: Payer: Self-pay

## 2020-11-07 ENCOUNTER — Other Ambulatory Visit: Payer: Self-pay

## 2020-11-07 DIAGNOSIS — Z961 Presence of intraocular lens: Secondary | ICD-10-CM | POA: Diagnosis not present

## 2020-11-07 DIAGNOSIS — H40023 Open angle with borderline findings, high risk, bilateral: Secondary | ICD-10-CM | POA: Diagnosis not present

## 2020-11-07 DIAGNOSIS — Z947 Corneal transplant status: Secondary | ICD-10-CM | POA: Diagnosis not present

## 2020-11-07 DIAGNOSIS — H04123 Dry eye syndrome of bilateral lacrimal glands: Secondary | ICD-10-CM | POA: Diagnosis not present

## 2020-11-07 MED ORDER — FLUOROMETHOLONE 0.1 % OP SUSP
OPHTHALMIC | 11 refills | Status: DC
  Filled 2020-11-07: qty 10, 75d supply, fill #0

## 2020-11-07 MED ORDER — TIMOLOL MALEATE 0.5 % OP SOLN
1.0000 [drp] | Freq: Two times a day (BID) | OPHTHALMIC | 11 refills | Status: DC
  Filled 2020-11-07: qty 20, 90d supply, fill #0

## 2020-11-07 MED ORDER — LATANOPROST 0.005 % OP SOLN
OPHTHALMIC | 3 refills | Status: DC
  Filled 2020-11-07: qty 2.5, 25d supply, fill #0
  Filled 2021-01-20: qty 2.5, 20d supply, fill #0
  Filled 2021-02-23: qty 2.5, 20d supply, fill #1
  Filled 2021-04-03: qty 2.5, 20d supply, fill #2
  Filled 2021-05-03: qty 2.5, 20d supply, fill #3

## 2020-11-08 ENCOUNTER — Other Ambulatory Visit: Payer: Self-pay

## 2020-11-10 ENCOUNTER — Other Ambulatory Visit: Payer: Self-pay

## 2020-11-15 ENCOUNTER — Emergency Department (HOSPITAL_COMMUNITY)
Admission: EM | Admit: 2020-11-15 | Discharge: 2020-11-16 | Disposition: A | Discharge status: Home / Self Care | Payer: Medicare Other | Attending: Emergency Medicine | Admitting: Emergency Medicine

## 2020-11-15 ENCOUNTER — Other Ambulatory Visit: Payer: Self-pay

## 2020-11-15 DIAGNOSIS — M546 Pain in thoracic spine: Secondary | ICD-10-CM | POA: Diagnosis not present

## 2020-11-15 DIAGNOSIS — Z5321 Procedure and treatment not carried out due to patient leaving prior to being seen by health care provider: Secondary | ICD-10-CM | POA: Insufficient documentation

## 2020-11-15 MED FILL — Isosorbide Mononitrate Tab ER 24HR 30 MG: ORAL | 30 days supply | Qty: 30 | Fill #2 | Status: AC

## 2020-11-15 NOTE — ED Triage Notes (Signed)
Pt c/o upper back pain that started 3 days ago, denies any injury.

## 2020-11-15 NOTE — ED Provider Notes (Signed)
Emergency Medicine Provider Triage Evaluation Note  Bryan Sullivan , a 75 y.o. male  was evaluated in triage.  Pt complains of left posterior upper back pain x3 days with some associated shortness of breath.  Pain radiates down the back.  Denies any recent trauma, new activity, or exercise.  Denies any palpitations but does have history of hypertension.  Review of Systems  Positive: Left upper back pain associated shortness of breath, Negative: Fevers, chills, palpitations, nausea, vomiting, abdominal pain  Physical Exam  BP (!) 161/78 (BP Location: Left Arm)   Pulse 68   Temp 98.1 F (36.7 C) (Oral)   Resp 16   SpO2 100%  Gen:   Awake, no distress   Resp:  Normal effort  MSK:   Moves extremities without difficulty  Other:  RRR no M/R/G.  Lungs CTAB, with the exception of diminished breath sounds on left lung base.  Medical Decision Making  Medically screening exam initiated at 9:34 PM.  Appropriate orders placed.  Jearl Herms was informed that the remainder of the evaluation will be completed by another provider, this initial triage assessment does not replace that evaluation, and the importance of remaining in the ED until their evaluation is complete.  This chart was dictated using voice recognition software, Dragon. Despite the best efforts of this provider to proofread and correct errors, errors may still occur which can change documentation meaning.    Aura Dials 11/15/20 [id number]    Truddie Hidden, MD 11/15/20 [phone]

## 2020-11-16 NOTE — ED Notes (Signed)
Patient left per guest services.

## 2020-12-01 ENCOUNTER — Other Ambulatory Visit: Payer: Self-pay

## 2020-12-06 ENCOUNTER — Other Ambulatory Visit: Payer: Self-pay

## 2020-12-06 DIAGNOSIS — H40023 Open angle with borderline findings, high risk, bilateral: Secondary | ICD-10-CM | POA: Diagnosis not present

## 2020-12-06 DIAGNOSIS — S0501XA Injury of conjunctiva and corneal abrasion without foreign body, right eye, initial encounter: Secondary | ICD-10-CM | POA: Diagnosis not present

## 2020-12-06 MED ORDER — OFLOXACIN 0.3 % OP SOLN
OPHTHALMIC | 0 refills | Status: DC
  Filled 2020-12-06: qty 5, 18d supply, fill #0

## 2020-12-07 ENCOUNTER — Ambulatory Visit: Payer: Medicare Other | Admitting: Nurse Practitioner

## 2020-12-07 DIAGNOSIS — H40023 Open angle with borderline findings, high risk, bilateral: Secondary | ICD-10-CM | POA: Diagnosis not present

## 2020-12-07 DIAGNOSIS — H1840 Unspecified corneal degeneration: Secondary | ICD-10-CM | POA: Diagnosis not present

## 2020-12-07 DIAGNOSIS — S0501XD Injury of conjunctiva and corneal abrasion without foreign body, right eye, subsequent encounter: Secondary | ICD-10-CM | POA: Diagnosis not present

## 2020-12-08 ENCOUNTER — Other Ambulatory Visit: Payer: Self-pay

## 2020-12-08 DIAGNOSIS — R0789 Other chest pain: Secondary | ICD-10-CM | POA: Diagnosis not present

## 2020-12-08 DIAGNOSIS — I2510 Atherosclerotic heart disease of native coronary artery without angina pectoris: Secondary | ICD-10-CM | POA: Diagnosis not present

## 2020-12-08 DIAGNOSIS — I10 Essential (primary) hypertension: Secondary | ICD-10-CM | POA: Diagnosis not present

## 2020-12-08 MED ORDER — AMLODIPINE BESYLATE 2.5 MG PO TABS
ORAL_TABLET | ORAL | 6 refills | Status: DC
  Filled 2020-12-08: qty 60, 30d supply, fill #0
  Filled 2021-01-20: qty 60, 30d supply, fill #1
  Filled 2021-03-20: qty 60, 30d supply, fill #2
  Filled 2021-04-14: qty 60, 30d supply, fill #3
  Filled 2021-06-16: qty 60, 30d supply, fill #0
  Filled 2021-08-03: qty 60, 30d supply, fill #1
  Filled 2021-08-24 – 2021-08-25 (×2): qty 60, 30d supply, fill #2

## 2020-12-08 MED ORDER — DOXYCYCLINE MONOHYDRATE 50 MG PO CAPS
ORAL_CAPSULE | ORAL | 3 refills | Status: DC
  Filled 2020-12-08: qty 90, 90d supply, fill #0

## 2020-12-09 ENCOUNTER — Other Ambulatory Visit: Payer: Self-pay

## 2020-12-09 DIAGNOSIS — Z947 Corneal transplant status: Secondary | ICD-10-CM | POA: Diagnosis not present

## 2020-12-09 DIAGNOSIS — Z961 Presence of intraocular lens: Secondary | ICD-10-CM | POA: Diagnosis not present

## 2020-12-09 DIAGNOSIS — H16231 Neurotrophic keratoconjunctivitis, right eye: Secondary | ICD-10-CM | POA: Diagnosis not present

## 2020-12-09 DIAGNOSIS — T868411 Corneal transplant failure, right eye: Secondary | ICD-10-CM | POA: Diagnosis not present

## 2020-12-09 DIAGNOSIS — H40022 Open angle with borderline findings, high risk, left eye: Secondary | ICD-10-CM | POA: Diagnosis not present

## 2020-12-09 DIAGNOSIS — H4089 Other specified glaucoma: Secondary | ICD-10-CM | POA: Diagnosis not present

## 2020-12-09 DIAGNOSIS — H16001 Unspecified corneal ulcer, right eye: Secondary | ICD-10-CM | POA: Diagnosis not present

## 2020-12-09 MED ORDER — OFLOXACIN 0.3 % OP SOLN
OPHTHALMIC | 0 refills | Status: DC
  Filled 2020-12-09: qty 5, 18d supply, fill #0

## 2020-12-14 DIAGNOSIS — Z961 Presence of intraocular lens: Secondary | ICD-10-CM | POA: Diagnosis not present

## 2020-12-14 DIAGNOSIS — H16001 Unspecified corneal ulcer, right eye: Secondary | ICD-10-CM | POA: Diagnosis not present

## 2020-12-14 DIAGNOSIS — H16231 Neurotrophic keratoconjunctivitis, right eye: Secondary | ICD-10-CM | POA: Diagnosis not present

## 2020-12-14 DIAGNOSIS — H4089 Other specified glaucoma: Secondary | ICD-10-CM | POA: Diagnosis not present

## 2020-12-14 DIAGNOSIS — Z947 Corneal transplant status: Secondary | ICD-10-CM | POA: Diagnosis not present

## 2020-12-15 ENCOUNTER — Other Ambulatory Visit: Payer: Self-pay

## 2020-12-15 MED ORDER — OFLOXACIN 0.3 % OP SOLN
OPHTHALMIC | 0 refills | Status: DC
  Filled 2020-12-15: qty 10, 50d supply, fill #0
  Filled 2020-12-30: qty 10, 53d supply, fill #0

## 2020-12-18 NOTE — Progress Notes (Signed)
Established Patient Office Visit  Subjective:  Patient ID: Bryan Sullivan, male    DOB: 1945-08-22  Age: 75 y.o. MRN: [id number]  CC: PCP f/u and COPD   HPI Bryan Sullivan presents for f/u OV Not seen since 2020.  Patient's primary care provider is Ms[facility] Patient sees cardiologist in [place] the last note is as documented below [facility] in [place]: Bryan Sullivan is a 75 y.o. male with past medical history of  HTN, Chest pain, Mild nonobstructive CAD, mildly elevated calcium score 57 by coronary CT from March 2020, COPD, Asthma, History of COVID-19 infection requiring hospitalization in April 2020, CKD stage II to III, creat 1.23, GFR 57 from June 2020, History of acute kidney injury, creatinine 1.89 GFR 34 from April 2020, History of mild anemia with Hgb 11.3 and MCV 84 in April 2020, Hgb 11 and MCV 91 in June 2020, pt is on ASA and denies bleeding, Lumbar radiculopathy, Arthritis of knees, presented for f/u for CP, with known history of mild CAD.  Patient feels better. No more episodes of chest pain.  Patient has history of episodes of chest pain at the low sternum over the 2 years until January 2022.  Chest pain was 6 out of 10 and presented about once a month.  Chest pain was reproducible with inspiration-it is better, and also with palpation of the chest-it is better.   Patient walks 1 hour daily when weather permits without problem with no chest pain.  When weather is cold patient walks 15 to 20 minutes inside the house.  Patient has asthma and he has sometimes shortness of breath and wheezing for which she takes inhalers and they help. No recent dyspnea or wheezing.  Patient denies chest pain suggestive of angina, palpitations, near syncope or syncope, orthopnoe or PND.  Patient can walk 1 hour without problems.   Patient doesn't snore at night. No history of sleep apnea.  Patient gained 3 pounds  since last visit 61-monthago and BMI now is 26.2.   BP is low normal today and HR is under control today.  Per patient's BP log BP is usually normal with systolic BP [id number] sometimes systolic blood pressure is up to 150s. Per patient BP is usually lower in the morning and gets higher in the evening. Heart rate remains under control at home.  ECG today on December 08, 2020 Sinus Rhythm, HR 71 - Left axis.  - Nonspecific T-abnormality in aVL.  -Mild IVCD. Mildly ABNORMAL  ECG on June 10, 2020- Sinus Rhythm, HR 72 Mild IVCD. Slight nonspecific ST-T changes in lead aVL. Slight early repolarization changes. Mildly abnormal  Recent cardiac echo from Feb, 2022 revealed borderline to mildly reduced LV systolic function with EF 49-51%,  borderline to mildly reduced LV wall motion,  normal LV diastolic function,  no significant valvular disease,  no pulmonary hypertension. The aortic root is at upper limit of normal in size - 3.6cm. Proximal ascending aortic size is at upper limits of normal 3.4 cm  Cardiac MRI from Oct 25, 2020 - LV EF 58%. 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  There is paradoxical motion/wall motion abnormality of the septum.  Additionally there is apical thinning and delayed enhancement with apical hypokinesis raising suspicion for myocardial infarction. No myocardial edema. Right ventricle: Normal size and wall thickness. No aneurysm or fibrofatty infiltration.. Right ventricular ejection  fraction calculated at 34%.  Coronary CT from March 2020 -mildly elevated calcium score-57. Mild nonobstructive CAD with left main 0 to 25% stenosis, LAD proximal 0 to 25% stenosis, left circumflex with mild plaque in distal portion.  First diagonal, OM1 and RCA with no significant CAD.  Chest x-ray was noted negative from April 2020.    Recent blood tests from June 2020- K 4.2,  creat 1.23, GFR 57,  WBC 4.5, Hgb 11, MCV 91, platelets 195. LDL 51, HDL 29, TG 124.  From April 2020 - K 4.4, creat 1.89, GFR 34, WBC 2.6,  Hgb 11.3, MCV 84.  Patient doesn't smoke. Patient quit smoking in 1970s. No history of ETOH or drug abuse.  No Family history of premature CAD.   Bryan Sullivan is a 75 y.o. male with past medical history as above presented for evaluation follow up for  chest pain, palpitations, dyspnea, syncope, CAD, CHF, AFib.  Chest pain - very atypical for 2 years once a month until January 2022 as above. No more chest pain.  Pleuritic CP, GI etiology of CP was high on the differential.  By coronary CT from March 2020 only mild nonobstructive CAD noted. Patient walks 1 hour daily without problem.  Occasional episodes of SOB, wheezing-likely secondary to asthma. Patient uses inhalers and they help. Patient walks 1 hour when weather permits without problem. No recent dyspnea or wheezing.  Mild nonobstructive CAD by coronary CT from March 2020 with calcium score 57-no more chest pain. See chest pain above. Patient can walk 1 hour without problems. Blood pressure and heart rate are mostly under control.   Normal cardiac MRI from May 2022 as above-cardiac echo from February 2022 with borderline low LVEF 49 to 51%. Cardiac MRI was done in May 2022 revealed normal LVEF 58% but evidence of previous myocarditis was noted as above. Could be postviral myocarditis after COVID infection in April 2020. Patient is on metoprolol ER, losartan, amlodipine, aspirin and statin. Patient walks 1 hour daily without problem.   Hypertension-blood pressure is low normal today 100/56. Per patient's BP log blood pressure remains mostly in normal range but sometimes mildly elevated. Per patient BP usually is normal in the morning but gets elevated in the evening. I suggested to cut amlodipine 5 mg to half and take 2.5 mg in the morning and 2.5  mg in the evening and hold it if systolic blood pressure less than 115. Per patient son it would be difficult to cut amlodipine pill so he prefers to have new prescription for 2.5 mg pills. We will give patient amlodipine 2.5 mg p.o. twice a day.   Hyperlipidemia-patient is on atorvastatin 40 mg per PCPs suggestion. The goal of LDL cholesterol less than 100, HDL cholesterol more than 45, Triglycerides less than 150. Previous lipid panel was mildly abnormal from June 2020 with HDL low, but LDL and TG at goal as above. Further management of HLP as per PCP.  CKD stage II-III, history of AKI-noted. Further management as per PCP.  Mild anemia with hemoglobin 11-11.3 with MCV 91 from June 2020-patient is on aspirin and denies any bleeding. Anemia of chronic disease is on differential. Further evaluation and management of anemia as per PCP.  PLAN:  Discontinue amlodipine 5 mg p.o. daily. Start new prescription of amlodipine 2.5 mg p.o. twice a day. Hold amlodipine if systolic blood pressure less than 115.  Continue other current medications.  Keep checking blood pressure and heart rate daily and record it and let  us know if any abnormal readings noted.  Stay on low salt, low cholesterol diet, walk daily.  Drink plenty of water - about 55-60 ounces or more a day - to avoid dehydration and volume depletion.  Please avoid more than 1 caffeinated beverage.   Avoid any strenuous activities.  Please avoid alcohol.  If you develop significant chest pain, shortness of breath, palpitations, near-syncope or syncope or lightheadedness, please call 911 and be transferred to emergency room for evaluation.  Care plan and follow-up as discussed or as needed if any worsening symptoms or change in condition. Pt expressed understanding. No barriers to meeting goals. After visit summary was given to the patient.  Marina Goodell, MD, PhD  Patient is accompanied with his son who provides Arabic  interpretation.  Patient brings his medications with him in a bag.  We do a complete medication reconciliation.  For example he is supposed to be on losartan but his Medicare part B does not cover it so now he is on valsartan.  He has both bottles of medications and has been taking both medications.  Other conflicting matters are his eyedrops.  He is on multiple different eyedrops listed on his medication list and they are prescribed by his ophthalmologist.  The root cause of this patient's medication reconciliation conflict is that he does not have a part D Medicare he only is buying medications off part of the.  Our pharmacy does not cover some of the medications and they have to go to an outside pharmacy.  Also he is seeing multiple providers and this is creating some conflict in medications.  Patient states he does have increased shortness of breath and chest tightness.  He is requesting refills on his albuterol nebulizer.  Past Medical History:  Diagnosis Date   Asthma    As child    BPH (benign prostatic hyperplasia)    COVID-19 09/19/2018   COVID-19 virus infection 09/18/2018   Enlarged prostate Dx 2001   Hypertension    Dx at age 27   Lumbar radiculopathy    Open angle with borderline findings and high glaucoma risk in left eye    Primary open angle glaucoma of right eye, severe stage     Past Surgical History:  Procedure Laterality Date   CATARACT EXTRACTION W/ INTRAOCULAR LENS  IMPLANT, BILATERAL Bilateral    CORNEAL TRANSPLANT Right    EYE SURGERY      Family History  Problem Relation Age of Onset   Diabetes Mother    Asthma Father    Hypertension Father    Asthma Sister     Social History   Socioeconomic History   Marital status: Married    Spouse name: Not on file   Number of children: Not on file   Years of education: Not on file   Highest education level: Not on file  Occupational History   Not on file  Tobacco Use   Smoking status: Former    Types:  Cigarettes    Quit date: 09/21/1968    Years since quitting: 52.2   Smokeless tobacco: Never   Tobacco comments:    "quit smoking cigarettes in 1970"  Vaping Use   Vaping Use: Never used  Substance and Sexual Activity   Alcohol use: No   Drug use: No   Sexual activity: Not on file  Other Topics Concern   Not on file  Social History Narrative   From [place]   Moved to [place] in 2014  Social Determinants of Health   Financial Resource Strain: Not on file  Food Insecurity: Not on file  Transportation Needs: Not on file  Physical Activity: Not on file  Stress: Not on file  Social Connections: Not on file  Intimate Partner Violence: Not on file    Outpatient Medications Prior to Visit  Medication Sig Dispense Refill   acetaminophen (TYLENOL) 500 MG tablet Take 500 mg by mouth as needed.     amLODipine (NORVASC) 2.5 MG tablet Take one tablet (2.5 mg dose) by mouth every 12 (twelve) hours. Hold if systolic blood pressure less than 115 60 tablet 6   aspirin 81 MG EC tablet Take 1 tablet (81 mg total) by mouth daily. 30 tablet 0   atorvastatin (LIPITOR) 40 MG tablet Take 1 tablet (40 mg total) by mouth daily. 90 tablet 3   diclofenac Sodium (VOLTAREN) 1 % GEL APPLY 4 G TOPICALLY 4 (FOUR) TIMES DAILY. 350 g 3   dorzolamide (TRUSOPT) 2 % ophthalmic solution Place 1 drop into the right eye 3 (three) times daily. 30 mL 3   doxycycline (MONODOX) 50 MG capsule Take 1 capsule (50 mg total) by mouth once daily 30 capsule 3   famotidine (PEPCID) 20 MG tablet TAKE 1 TABLET (20 MG) BY MOUTH DAILY AT BEDTIME AS NEEDED 90 tablet 0   finasteride (PROSCAR) 5 MG tablet Take 1 tablet (5 mg total) by mouth daily. 30 tablet 6   fluorometholone (FML) 0.1 % ophthalmic suspension INSTILL 1 DROP INTO RIGHT EYE TWICE A DAY 5 mL 11   fluticasone (FLONASE) 50 MCG/ACT nasal spray Place 2 sprays into both nostrils daily. 16 g 2   metoprolol succinate (TOPROL-XL) 25 MG 24 hr tablet Take one tablet (25 mg dose) by  mouth daily. 30 tablet 5   ofloxacin (OCUFLOX) 0.3 % ophthalmic solution Place 1 drop into the right eye 3 (three) times a day for 30 days 10 mL 0   tamsulosin (FLOMAX) 0.4 MG CAPS capsule Take 2 capsules (0.8 mg total) by mouth daily. 60 capsule 3   timolol (TIMOPTIC) 0.5 % ophthalmic solution Place 1 drop into both eyes 2 (two) times daily. 20 mL 3   albuterol (VENTOLIN HFA) 108 (90 Base) MCG/ACT inhaler Take one puff every 4-6 hours 25.5 g 0   Budeson-Glycopyrrol-Formoterol (BREZTRI AEROSPHERE) 160-9-4.8 MCG/ACT AERO Inhale 2 puffs into the lungs in the morning and at bedtime.     isosorbide mononitrate (IMDUR) 30 MG 24 hr tablet TAKE ONE TABLET (30 MG DOSE) BY MOUTH DAILY. 30 tablet 5   valsartan (DIOVAN) 80 MG tablet Take 1 tablet (80 mg total) by mouth daily. 90 tablet 0   ibuprofen (ADVIL) 600 MG tablet TAKE 1 TABLET (600 MG TOTAL) BY MOUTH EVERY 6 (SIX) HOURS AS NEEDED FOR MODERATE PAIN. 120 tablet 3   latanoprost (XALATAN) 0.005 % ophthalmic solution Instill 1 drop Both Eyes at bedtime 2.5 mL 3   albuterol (PROVENTIL) (2.5 MG/3ML) 0.083% nebulizer solution Take 3 mLs (2.5 mg total) by nebulization every 6 (six) hours as needed for wheezing or shortness of breath. (Patient not taking: Reported on 12/19/2020) 150 mL 1   amLODipine (NORVASC) 5 MG tablet Take 1 tablet (5 mg total) by mouth daily. 90 tablet 3   fluorometholone (FML) 0.1 % ophthalmic suspension INSTILL 1 DROP INTO RIGHT EYE TWICE A DAY 10 mL 11   fluticasone-salmeterol (ADVAIR HFA) 230-21 MCG/ACT inhaler Inhale 2 puffs into the lungs 2 (two) times daily. (Patient not taking:  Reported on 12/19/2020) 12 g 6   latanoprost (XALATAN) 0.005 % ophthalmic solution Place 1 drop into both eyes at bedtime. 12.5 mL 3   ofloxacin (OCUFLOX) 0.3 % ophthalmic solution Place 1 drop into the right eye 4 (four) times daily for 30 days 6 mL 0   omeprazole (PRILOSEC) 20 MG capsule Take 1 capsule (20 mg total) by mouth daily. (Patient not taking:  Reported on 12/19/2020) 90 capsule 0   timolol (TIMOPTIC) 0.5 % ophthalmic solution INSTILL 1 DROP INTO BOTH EYES TWICE A DAY 20 mL 11   No facility-administered medications prior to visit.    No Known Allergies  ROS Review of Systems  HENT:  Positive for ear pain. Negative for ear discharge, hearing loss and tinnitus.   Eyes:  Positive for discharge.  Respiratory:  Positive for chest tightness and shortness of breath. Negative for cough, choking and wheezing.   Cardiovascular:  Negative for chest pain, palpitations and leg swelling.  Gastrointestinal: Negative.   Genitourinary: Negative.   Musculoskeletal: Negative.   Neurological: Negative.   Psychiatric/Behavioral:  Negative for dysphoric mood. The patient is not nervous/anxious.      Objective:    Physical Exam Constitutional:      Appearance: Normal appearance. He is normal weight.  HENT:     Head: Normocephalic and atraumatic.     Nose: Nose normal.     Mouth/Throat:     Mouth: Mucous membranes are moist.  Cardiovascular:     Rate and Rhythm: Normal rate and regular rhythm.     Pulses: Normal pulses.     Heart sounds: Normal heart sounds.  Pulmonary:     Effort: Pulmonary effort is normal. No respiratory distress.     Breath sounds: No stridor. Wheezing present. No rhonchi.  Chest:     Chest wall: No tenderness.  Abdominal:     General: Abdomen is flat. Bowel sounds are normal.     Palpations: Abdomen is soft.  Musculoskeletal:        General: No swelling. Normal range of motion.     Cervical back: Normal range of motion.  Skin:    General: Skin is warm and dry.  Neurological:     General: No focal deficit present.     Mental Status: He is alert and oriented to person, place, and time. Mental status is at baseline.  Psychiatric:        Mood and Affect: Mood normal.        Behavior: Behavior normal.        Thought Content: Thought content normal.        Judgment: Judgment normal.    BP 129/79   Pulse 62    Temp 98.5 F (36.9 C)   Resp 16   Wt 184 lb (83.5 kg)   SpO2 99%   BMI 26.40 kg/m  Wt Readings from Last 3 Encounters:  12/19/20 184 lb (83.5 kg)  11/12/19 187 lb (84.8 kg)  10/07/19 187 lb 6.3 oz (85 kg)     Health Maintenance Due  Topic Date Due   COVID-19 Vaccine (1) Never done    There are no preventive care reminders to display for this patient.  Lab Results  Component Value Date   TSH 0.607 06/22/2015   Lab Results  Component Value Date   WBC 6.1 10/27/2020   HGB 12.9 (L) 10/27/2020   HCT 39.3 10/27/2020   MCV 87 10/27/2020   PLT 256 10/27/2020   Lab Results  Component  Value Date   NA 143 10/27/2020   K 4.4 10/27/2020   CO2 25 10/27/2020   GLUCOSE 141 (H) 10/27/2020   BUN 16 10/27/2020   CREATININE 1.24 10/27/2020   BILITOT 0.4 10/27/2020   ALKPHOS 72 10/27/2020   AST 16 10/27/2020   ALT 14 10/27/2020   PROT 6.9 10/27/2020   ALBUMIN 4.2 10/27/2020   CALCIUM 9.4 10/27/2020   ANIONGAP 8 09/22/2018   EGFR 60 10/27/2020   Lab Results  Component Value Date   CHOL 90 (L) 10/27/2020   Lab Results  Component Value Date   HDL 30 (L) 10/27/2020   Lab Results  Component Value Date   LDLCALC 42 10/27/2020   Lab Results  Component Value Date   TRIG 88 10/27/2020   Lab Results  Component Value Date   CHOLHDL 3.0 10/27/2020   Lab Results  Component Value Date   HGBA1C 6.3 (H) 10/27/2020      Assessment & Plan:   Problem List Items Addressed This Visit       Cardiovascular and Mediastinum   Essential hypertension   Relevant Medications   valsartan (DIOVAN) 80 MG tablet   isosorbide mononitrate (IMDUR) 30 MG 24 hr tablet     Respiratory   Chronic obstructive airway disease with asthma (HCC) (Chronic)   Relevant Medications   albuterol (VENTOLIN HFA) 108 (90 Base) MCG/ACT inhaler   Budeson-Glycopyrrol-Formoterol (BREZTRI AEROSPHERE) 160-9-4.8 MCG/ACT AERO   albuterol (PROVENTIL) (2.5 MG/3ML) 0.083% nebulizer solution    Meds  ordered this encounter  Medications   DISCONTD: albuterol (PROVENTIL) (2.5 MG/3ML) 0.083% nebulizer solution    Sig: Take 3 mLs (2.5 mg total) by nebulization every 6 (six) hours as needed for wheezing or shortness of breath.    Dispense:  150 mL    Refill:  1   albuterol (VENTOLIN HFA) 108 (90 Base) MCG/ACT inhaler    Sig: Take one by mouth puff every 4-6 hours    Dispense:  25.5 g    Refill:  0   Budeson-Glycopyrrol-Formoterol (BREZTRI AEROSPHERE) 160-9-4.8 MCG/ACT AERO    Sig: Inhale 2 puffs into the lungs in the morning and at bedtime.    Dispense:  10.7 g    Refill:  4   valsartan (DIOVAN) 80 MG tablet    Sig: Take 1 tablet (80 mg total) by mouth daily.    Dispense:  90 tablet    Refill:  2    For future refills Insurance does not cover losartan   isosorbide mononitrate (IMDUR) 30 MG 24 hr tablet    Sig: TAKE ONE TABLET (30 MG DOSE) BY MOUTH DAILY.    Dispense:  30 tablet    Refill:  5   albuterol (PROVENTIL) (2.5 MG/3ML) 0.083% nebulizer solution    Sig: Take 3 mLs (2.5 mg total) by nebulization every 6 (six) hours as needed for wheezing or shortness of breath.    Dispense:  150 mL    Refill:  1    Follow-up: Return in about 4 months (around 04/21/2021) for fleming.    Asencion Noble, MD

## 2020-12-19 ENCOUNTER — Other Ambulatory Visit: Payer: Self-pay

## 2020-12-19 ENCOUNTER — Encounter: Payer: Self-pay | Admitting: Critical Care Medicine

## 2020-12-19 ENCOUNTER — Other Ambulatory Visit: Payer: Self-pay | Admitting: Pharmacist

## 2020-12-19 ENCOUNTER — Ambulatory Visit: Payer: Medicare Other | Attending: Nurse Practitioner | Admitting: Critical Care Medicine

## 2020-12-19 DIAGNOSIS — E7800 Pure hypercholesterolemia, unspecified: Secondary | ICD-10-CM

## 2020-12-19 DIAGNOSIS — Z8616 Personal history of COVID-19: Secondary | ICD-10-CM | POA: Diagnosis not present

## 2020-12-19 DIAGNOSIS — Z79899 Other long term (current) drug therapy: Secondary | ICD-10-CM | POA: Diagnosis not present

## 2020-12-19 DIAGNOSIS — Z87891 Personal history of nicotine dependence: Secondary | ICD-10-CM | POA: Insufficient documentation

## 2020-12-19 DIAGNOSIS — J449 Chronic obstructive pulmonary disease, unspecified: Secondary | ICD-10-CM | POA: Diagnosis not present

## 2020-12-19 DIAGNOSIS — N401 Enlarged prostate with lower urinary tract symptoms: Secondary | ICD-10-CM

## 2020-12-19 DIAGNOSIS — H40022 Open angle with borderline findings, high risk, left eye: Secondary | ICD-10-CM | POA: Diagnosis not present

## 2020-12-19 DIAGNOSIS — Z791 Long term (current) use of non-steroidal anti-inflammatories (NSAID): Secondary | ICD-10-CM | POA: Diagnosis not present

## 2020-12-19 DIAGNOSIS — Z8249 Family history of ischemic heart disease and other diseases of the circulatory system: Secondary | ICD-10-CM | POA: Insufficient documentation

## 2020-12-19 DIAGNOSIS — T868411 Corneal transplant failure, right eye: Secondary | ICD-10-CM

## 2020-12-19 DIAGNOSIS — I130 Hypertensive heart and chronic kidney disease with heart failure and stage 1 through stage 4 chronic kidney disease, or unspecified chronic kidney disease: Secondary | ICD-10-CM | POA: Diagnosis not present

## 2020-12-19 DIAGNOSIS — I2510 Atherosclerotic heart disease of native coronary artery without angina pectoris: Secondary | ICD-10-CM | POA: Diagnosis not present

## 2020-12-19 DIAGNOSIS — I10 Essential (primary) hypertension: Secondary | ICD-10-CM

## 2020-12-19 DIAGNOSIS — Z7951 Long term (current) use of inhaled steroids: Secondary | ICD-10-CM | POA: Insufficient documentation

## 2020-12-19 DIAGNOSIS — R071 Chest pain on breathing: Secondary | ICD-10-CM | POA: Diagnosis not present

## 2020-12-19 DIAGNOSIS — R3914 Feeling of incomplete bladder emptying: Secondary | ICD-10-CM

## 2020-12-19 DIAGNOSIS — Z7982 Long term (current) use of aspirin: Secondary | ICD-10-CM | POA: Insufficient documentation

## 2020-12-19 MED ORDER — VALSARTAN 80 MG PO TABS
80.0000 mg | ORAL_TABLET | Freq: Every day | ORAL | 2 refills | Status: DC
  Filled 2020-12-19 – 2021-01-20 (×2): qty 90, 90d supply, fill #0
  Filled 2021-04-14: qty 90, 90d supply, fill #1

## 2020-12-19 MED ORDER — ALBUTEROL SULFATE HFA 108 (90 BASE) MCG/ACT IN AERS
INHALATION_SPRAY | RESPIRATORY_TRACT | 0 refills | Status: DC
  Filled 2020-12-19: qty 18, 25d supply, fill #0

## 2020-12-19 MED ORDER — ALBUTEROL SULFATE (2.5 MG/3ML) 0.083% IN NEBU: 2.5000 mg | INHALATION_SOLUTION | Freq: Four times a day (QID) | RESPIRATORY_TRACT | 1 refills | Status: DC | PRN

## 2020-12-19 MED ORDER — ALBUTEROL SULFATE (2.5 MG/3ML) 0.083% IN NEBU
2.5000 mg | INHALATION_SOLUTION | Freq: Four times a day (QID) | RESPIRATORY_TRACT | 1 refills | Status: DC | PRN
  Filled 2020-12-19: qty 150, 13d supply, fill #0

## 2020-12-19 MED ORDER — BREZTRI AEROSPHERE 160-9-4.8 MCG/ACT IN AERO
2.0000 | INHALATION_SPRAY | Freq: Two times a day (BID) | RESPIRATORY_TRACT | 4 refills | Status: DC
  Filled 2020-12-19: qty 10.7, 30d supply, fill #0
  Filled 2021-01-20: qty 10.7, 30d supply, fill #1
  Filled 2021-03-03: qty 10.7, 30d supply, fill #2
  Filled 2021-04-03: qty 10.7, 30d supply, fill #3
  Filled 2021-04-24: qty 10.7, 30d supply, fill #4

## 2020-12-19 MED ORDER — ISOSORBIDE MONONITRATE ER 30 MG PO TB24
ORAL_TABLET | ORAL | 5 refills | Status: DC
  Filled 2020-12-19: qty 30, 30d supply, fill #0
  Filled 2021-01-20: qty 30, 30d supply, fill #1
  Filled 2021-03-03: qty 30, 30d supply, fill #2
  Filled 2021-04-03: qty 30, 30d supply, fill #3
  Filled 2021-05-03: qty 30, 30d supply, fill #4

## 2020-12-19 NOTE — Patient Instructions (Signed)
Refills on your isosorbide and metoprolol are being processed also refills on your handheld albuterol and albuterol nebulizer medication is being processed  Take your Breztri inhaler twice daily 2 inhalations every day  Please get your ophthalmology doctor to refill all of your eyedrops as indicated  No other changes in medications  Return to see Ms. Fleming for follow-up in 4 months

## 2020-12-19 NOTE — Progress Notes (Signed)
Medication refills Asthmatic medication - did not p/u albuterol d/t insurance

## 2020-12-19 NOTE — Assessment & Plan Note (Signed)
COPD asthma overlap syndrome  I reinstructed the patient as to the proper use of his [place] he inhaler which she was not using accurately  Also will refill the albuterol in the nebulizer however this will have to be prior authorized and was sent to an outside pharmacy

## 2020-12-19 NOTE — Assessment & Plan Note (Signed)
Hypertension currently well controlled and now on amlodipine 2.5 mg twice daily, isosorbide 30 mg daily, valsartan 80 mg.  I discontinued and threw away the bottle of losartan as he was taking that along with the valsartan  Complete medication reconciliation occurred and now the current medicines listed in the patient's chart are accurate

## 2020-12-19 NOTE — Assessment & Plan Note (Signed)
Per cardiology 

## 2020-12-19 NOTE — Assessment & Plan Note (Signed)
Continue tamsulosin and Proscar

## 2020-12-19 NOTE — Assessment & Plan Note (Signed)
Care per ophthalmology

## 2020-12-19 NOTE — Assessment & Plan Note (Signed)
Continue atorvastatin

## 2020-12-20 ENCOUNTER — Other Ambulatory Visit: Payer: Self-pay

## 2020-12-21 DIAGNOSIS — Z961 Presence of intraocular lens: Secondary | ICD-10-CM | POA: Diagnosis not present

## 2020-12-21 DIAGNOSIS — H16231 Neurotrophic keratoconjunctivitis, right eye: Secondary | ICD-10-CM | POA: Diagnosis not present

## 2020-12-21 DIAGNOSIS — H4089 Other specified glaucoma: Secondary | ICD-10-CM | POA: Diagnosis not present

## 2020-12-21 DIAGNOSIS — H16001 Unspecified corneal ulcer, right eye: Secondary | ICD-10-CM | POA: Diagnosis not present

## 2020-12-21 DIAGNOSIS — Z947 Corneal transplant status: Secondary | ICD-10-CM | POA: Diagnosis not present

## 2020-12-21 DIAGNOSIS — T868411 Corneal transplant failure, right eye: Secondary | ICD-10-CM | POA: Diagnosis not present

## 2020-12-21 DIAGNOSIS — H40022 Open angle with borderline findings, high risk, left eye: Secondary | ICD-10-CM | POA: Diagnosis not present

## 2020-12-30 ENCOUNTER — Other Ambulatory Visit: Payer: Self-pay

## 2021-01-04 DIAGNOSIS — Z947 Corneal transplant status: Secondary | ICD-10-CM | POA: Diagnosis not present

## 2021-01-04 DIAGNOSIS — H4089 Other specified glaucoma: Secondary | ICD-10-CM | POA: Diagnosis not present

## 2021-01-04 DIAGNOSIS — T868411 Corneal transplant failure, right eye: Secondary | ICD-10-CM | POA: Diagnosis not present

## 2021-01-04 DIAGNOSIS — H16231 Neurotrophic keratoconjunctivitis, right eye: Secondary | ICD-10-CM | POA: Diagnosis not present

## 2021-01-04 DIAGNOSIS — Z961 Presence of intraocular lens: Secondary | ICD-10-CM | POA: Diagnosis not present

## 2021-01-05 ENCOUNTER — Other Ambulatory Visit: Payer: Self-pay

## 2021-01-20 ENCOUNTER — Other Ambulatory Visit: Payer: Self-pay

## 2021-01-20 ENCOUNTER — Other Ambulatory Visit: Payer: Self-pay | Admitting: Nurse Practitioner

## 2021-01-21 NOTE — Telephone Encounter (Signed)
Requested medications are due for refill today yes  Requested medications are on the active medication list yes  Last refill 10/27/20  Last visit Was seen 7/25 but not for GERD, was seen 10/2018 GERD was assessed  Future visit scheduled no  Notes to clinic Please assess.

## 2021-01-23 ENCOUNTER — Other Ambulatory Visit: Payer: Self-pay

## 2021-01-23 MED ORDER — DORZOLAMIDE HCL 2 % OP SOLN
OPHTHALMIC | 11 refills | Status: DC
  Filled 2021-01-23: qty 10, 50d supply, fill #0
  Filled 2021-03-20: qty 10, 50d supply, fill #1
  Filled 2021-04-24: qty 10, 50d supply, fill #2
  Filled 2021-07-04: qty 10, 50d supply, fill #0
  Filled 2021-09-22: qty 10, 50d supply, fill #1
  Filled 2021-11-24: qty 10, 50d supply, fill #2

## 2021-01-24 ENCOUNTER — Other Ambulatory Visit: Payer: Self-pay

## 2021-01-24 MED ORDER — FAMOTIDINE 20 MG PO TABS
ORAL_TABLET | ORAL | 0 refills | Status: DC
  Filled 2021-01-24 – 2021-02-01 (×2): qty 90, 90d supply, fill #0

## 2021-01-25 DIAGNOSIS — Z947 Corneal transplant status: Secondary | ICD-10-CM | POA: Diagnosis not present

## 2021-01-25 DIAGNOSIS — H4089 Other specified glaucoma: Secondary | ICD-10-CM | POA: Diagnosis not present

## 2021-01-25 DIAGNOSIS — Z961 Presence of intraocular lens: Secondary | ICD-10-CM | POA: Diagnosis not present

## 2021-01-25 DIAGNOSIS — H16231 Neurotrophic keratoconjunctivitis, right eye: Secondary | ICD-10-CM | POA: Diagnosis not present

## 2021-01-25 DIAGNOSIS — T868411 Corneal transplant failure, right eye: Secondary | ICD-10-CM | POA: Diagnosis not present

## 2021-01-27 ENCOUNTER — Other Ambulatory Visit: Payer: Self-pay

## 2021-01-31 ENCOUNTER — Other Ambulatory Visit: Payer: Self-pay

## 2021-02-01 ENCOUNTER — Other Ambulatory Visit: Payer: Self-pay

## 2021-02-09 ENCOUNTER — Other Ambulatory Visit: Payer: Self-pay

## 2021-02-09 ENCOUNTER — Ambulatory Visit (INDEPENDENT_AMBULATORY_CARE_PROVIDER_SITE_OTHER): Payer: Medicare Other | Admitting: Surgery

## 2021-02-09 ENCOUNTER — Ambulatory Visit: Payer: Self-pay

## 2021-02-09 ENCOUNTER — Ambulatory Visit: Payer: Medicare Other | Admitting: Surgery

## 2021-02-09 ENCOUNTER — Encounter: Payer: Self-pay | Admitting: Surgery

## 2021-02-09 VITALS — BP 140/84 | HR 66 | Wt 186.0 lb

## 2021-02-09 DIAGNOSIS — I2510 Atherosclerotic heart disease of native coronary artery without angina pectoris: Secondary | ICD-10-CM | POA: Diagnosis not present

## 2021-02-09 DIAGNOSIS — M1712 Unilateral primary osteoarthritis, left knee: Secondary | ICD-10-CM

## 2021-02-09 DIAGNOSIS — M1711 Unilateral primary osteoarthritis, right knee: Secondary | ICD-10-CM | POA: Diagnosis not present

## 2021-02-09 NOTE — Progress Notes (Signed)
Office Visit Note   Patient: Bryan Sullivan           Date of Birth: 1945/11/05           MRN: [id number] Visit Date: 02/09/2021              Requested by: Gildardo Pounds, NP [place],  [place] [address] PCP: Gildardo Pounds, NP   Assessment & Plan: Visit Diagnoses:  1. Unilateral primary osteoarthritis, left knee   2. Unilateral primary osteoarthritis, right knee     Plan: As I did in May 2021 I offered patient left knee intra-articular Marcaine/Depo-Medrol injection but again he declined after taking several minutes to think about this.  I had actually had my assistant prepare the injection after he had initially agreed to proceed.  I also had discussion with his son over the phone while patient was present.  He has not had his knee injected in the past.  I advised patient that I do not think that MRI scans of the knees are indicated at this point.  I will have him follow-up with Dr. Alphonzo Severance here in our office to discuss possible benefits of knee replacements.  I did advise patient and his son that Dr. Marlou Sa is likely going to want to exhaust all conservative measures before going that route but he can discuss this further with them.  Follow-Up Instructions: Return in about 6 weeks (around 03/23/2021) for Consult with Dr. Marlou Sa to discuss possible scheduling of total knee replacement vs conservative tx.   Orders:  Orders Placed This Encounter  Procedures   XR KNEE 3 VIEW LEFT   XR KNEE 3 VIEW RIGHT   No orders of the defined types were placed in this encounter.     Procedures: No procedures performed   Clinical Data: No additional findings.   Subjective: Chief Complaint  Patient presents with   Right Knee - Follow-up   Left Knee - Follow-up    HPI 75 year old male returns with complaints of left greater than right knee pain.  I had seen patient in May 2021 for bilateral knee DJD left worse than right.  I offered left knee intra-articular  Marcaine/Depo-Medrol injection at that time but patient declined.  He was going to discuss this further with Dr. Louanne Skye at his next appointment.  Patient again comes in with ongoing left knee pain.  Right knee is not that bad.  He states that he was told by Dr. Louanne Skye that we could possibly order MRI scans of the knees.  Patient did put his son on speaker phone during the visit. Review of Systems No complaints of current cardiac pulmonary GI GU issues  Objective: Vital Signs: BP 140/84 (BP Location: Left Arm, Patient Position: Sitting, Cuff Size: Normal)   Pulse 66   Wt 186 lb (84.4 kg)   SpO2 97%   BMI 26.69 kg/m   Physical Exam HENT:     Head: Normocephalic.  Pulmonary:     Effort: No respiratory distress.  Musculoskeletal:     Comments: Gait is minimally antalgic.  Negative log about his.  Bilateral knees good range of motion.  No palpable effusions.  Has left greater than right joint line tenderness.  Ligaments feel stable.  Neurological:     Mental Status: He is alert.    Ortho Exam  Specialty Comments:  No specialty comments available.  Imaging: No results found.   PMFS History: Patient Active Problem List   Diagnosis Date  Noted   Coronary artery disease 06/10/2020   Pure hypercholesterolemia 11/18/2018   Gastroesophageal reflux disease 10/08/2016   Glaucoma (increased eye pressure) 12/16/2014   BPH (benign prostatic hyperplasia) 11/24/2014   DJD (degenerative joint disease) of knee 11/24/2014   Chronic obstructive airway disease with asthma ([place]) 10/11/2014   Essential hypertension 09/22/2014   Open angle with borderline findings and high glaucoma risk in left eye 08/24/2014   After cataract 02/10/2013   Lumbar radiculopathy 12/30/2012   Pseudophakia of both eyes 07/29/2012   Corneal transplant failure 07/15/2012   Past Medical History:  Diagnosis Date   Asthma    As child    BPH (benign prostatic hyperplasia)    COVID-19 09/19/2018   COVID-19 virus  infection 09/18/2018   Enlarged prostate Dx 2001   Hypertension    Dx at age 45   Lumbar radiculopathy    Open angle with borderline findings and high glaucoma risk in left eye    Primary open angle glaucoma of right eye, severe stage     Family History  Problem Relation Age of Onset   Diabetes Mother    Asthma Father    Hypertension Father    Asthma Sister     Past Surgical History:  Procedure Laterality Date   CATARACT EXTRACTION W/ INTRAOCULAR LENS  IMPLANT, BILATERAL Bilateral    CORNEAL TRANSPLANT Right    EYE SURGERY     Social History   Occupational History   Not on file  Tobacco Use   Smoking status: Former    Types: Cigarettes    Quit date: 09/21/1968    Years since quitting: 52.4   Smokeless tobacco: Never   Tobacco comments:    "quit smoking cigarettes in 1970"  Vaping Use   Vaping Use: Never used  Substance and Sexual Activity   Alcohol use: No   Drug use: No   Sexual activity: Not on file

## 2021-02-14 ENCOUNTER — Other Ambulatory Visit: Payer: Self-pay

## 2021-02-14 ENCOUNTER — Other Ambulatory Visit: Payer: Self-pay | Admitting: Nurse Practitioner

## 2021-02-14 DIAGNOSIS — K219 Gastro-esophageal reflux disease without esophagitis: Secondary | ICD-10-CM

## 2021-02-14 NOTE — Telephone Encounter (Signed)
Requested medication (s) are due for refill today - no  Requested medication (s) are on the active medication list -no  Future visit scheduled -no  Last refill: 10/27/20  Notes to clinic: Request RF: medication no longer current on medication list  Requested Prescriptions  Pending Prescriptions Disp Refills   omeprazole (PRILOSEC) 20 MG capsule 90 capsule 0    Sig: Take 1 capsule (20 mg total) by mouth daily.     Gastroenterology: Proton Pump Inhibitors Passed - 02/14/2021  2:23 PM      Passed - Valid encounter within last 12 months    Recent Outpatient Visits           1 month ago Chronic obstructive airway disease with asthma [facility])   [facility] Elsie Stain, MD   2 years ago Chronic obstructive airway disease with asthma [facility])   [place] [facility] Elsie Stain, MD   2 years ago [place] Elsie Stain, MD   2 years ago COVID-34 virus infection   [place] Elsie Stain, MD   2 years ago COVID-22 virus infection   [place], MD       Future Appointments             In 1 month Marlou Sa, [place]onna [place], MD [facility]               Requested Prescriptions  Pending Prescriptions Disp Refills   omeprazole (PRILOSEC) 20 MG capsule 90 capsule 0    Sig: Take 1 capsule (20 mg total) by mouth daily.     Gastroenterology: Proton Pump Inhibitors Passed - 02/14/2021  2:23 PM      Passed - Valid encounter within last 12 months    Recent Outpatient Visits           1 month ago Chronic obstructive airway disease with asthma [facility])   [place] Elsie Stain, MD   2 years ago Chronic obstructive airway disease with asthma [facility])   [place] Elsie Stain, MD   2 years ago [place], Patrick E, MD   2 years ago COVID-36 virus infection   [place] Elsie Stain, MD   2 years ago COVID-45 virus infection   [place], MD       Future Appointments             In 1 month Marlou Sa, Tonna Corner, MD [facility]

## 2021-02-16 ENCOUNTER — Other Ambulatory Visit: Payer: Self-pay

## 2021-02-16 MED ORDER — OMEPRAZOLE 20 MG PO CPDR
20.0000 mg | DELAYED_RELEASE_CAPSULE | Freq: Every day | ORAL | 0 refills | Status: DC
  Filled 2021-02-16: qty 90, 90d supply, fill #0

## 2021-02-23 ENCOUNTER — Encounter: Payer: Self-pay | Admitting: Surgery

## 2021-02-23 ENCOUNTER — Other Ambulatory Visit: Payer: Self-pay

## 2021-02-23 ENCOUNTER — Ambulatory Visit (INDEPENDENT_AMBULATORY_CARE_PROVIDER_SITE_OTHER): Payer: Medicare Other | Admitting: Surgery

## 2021-02-23 VITALS — BP 180/91 | HR 72 | Ht 70.0 in | Wt 186.0 lb

## 2021-02-23 DIAGNOSIS — I2510 Atherosclerotic heart disease of native coronary artery without angina pectoris: Secondary | ICD-10-CM | POA: Diagnosis not present

## 2021-02-23 DIAGNOSIS — M1711 Unilateral primary osteoarthritis, right knee: Secondary | ICD-10-CM

## 2021-02-23 DIAGNOSIS — M1712 Unilateral primary osteoarthritis, left knee: Secondary | ICD-10-CM

## 2021-02-23 MED ORDER — METHYLPREDNISOLONE ACETATE 40 MG/ML IJ SUSP
80.0000 mg | INTRAMUSCULAR | Status: AC | PRN
  Administered 2021-02-23: 80 mg via INTRA_ARTICULAR

## 2021-02-23 MED ORDER — BUPIVACAINE HCL 0.25 % IJ SOLN
6.0000 mL | INTRAMUSCULAR | Status: AC | PRN
  Administered 2021-02-23: 6 mL via INTRA_ARTICULAR

## 2021-02-23 MED ORDER — LIDOCAINE HCL 1 % IJ SOLN
3.0000 mL | INTRAMUSCULAR | Status: AC | PRN
  Administered 2021-02-23: 3 mL

## 2021-02-23 NOTE — Progress Notes (Signed)
Office Visit Note   Patient: Bryan Sullivan           Date of Birth: 15-Nov-1945           MRN: [id number] Visit Date: 02/23/2021              Requested by: Gildardo Pounds, NP [place],  [place] [address] PCP: Gildardo Pounds, NP   Assessment & Plan: Visit Diagnoses:  1. Unilateral primary osteoarthritis, right knee   2. Unilateral primary osteoarthritis, left knee     Plan: Today patient agreed to proceed with injection.  After patient consent left knee was prepped with Betadine and intra-articular Marcaine/Depo-Medrol injection performed.  Tolerated without complication.  Patient will follow-up in the office as needed.  If the right knee symptoms worsen over the next few weeks he will call let me know and I will plan to do an injection there.  All questions answered.  Follow-Up Instructions: Return if symptoms worsen or fail to improve.   Orders:  Orders Placed This Encounter  Procedures   Large Joint Inj   No orders of the defined types were placed in this encounter.     Procedures: Large Joint Inj: L knee on 02/23/2021 3:20 PM Indications: pain Details: 25 G 1.5 in needle, anteromedial approach Medications: 3 mL lidocaine 1 %; 6 mL bupivacaine 0.25 %; 80 mg methylPREDNISolone acetate 40 MG/ML Outcome: tolerated well, no immediate complications  Left knee intra-articular Marcaine/Depo-Medrol 6-2 injection performed.  Tolerated procedure well without complication. Consent was given by the patient. Patient was prepped and draped in the usual sterile fashion.      Clinical Data: No additional findings.   Subjective: Chief Complaint  Patient presents with   Left Knee - Follow-up   Right Knee - Follow-up    HPI 75 year old male with known history of left great and right knee pain secondary to DJD returns.  States that he would like to proceed with having left knee injected today.  I offered last office visit but he declined.  Continues have  ongoing pain in the left knee with activity.  Today states that right knee is less painful.  One of his sons is present today.   Objective: Vital Signs: BP (!) 180/91   Pulse 72   Ht 5\' 10"  (1.778 m)   Wt 186 lb (84.4 kg)   BMI 26.69 kg/m   Physical Exam Left knee good range of motion.  Some swelling without palpable effusion.  Joint line tender.  Calf nontender. Ortho Exam  Specialty Comments:  No specialty comments available.  Imaging: No results found.   PMFS History: Patient Active Problem List   Diagnosis Date Noted   Coronary artery disease 06/10/2020   Pure hypercholesterolemia 11/18/2018   Gastroesophageal reflux disease 10/08/2016   Glaucoma (increased eye pressure) 12/16/2014   BPH (benign prostatic hyperplasia) 11/24/2014   DJD (degenerative joint disease) of knee 11/24/2014   Chronic obstructive airway disease with asthma ([place]) 10/11/2014   Essential hypertension 09/22/2014   Open angle with borderline findings and high glaucoma risk in left eye 08/24/2014   After cataract 02/10/2013   Lumbar radiculopathy 12/30/2012   Pseudophakia of both eyes 07/29/2012   Corneal transplant failure 07/15/2012   Past Medical History:  Diagnosis Date   Asthma    As child    BPH (benign prostatic hyperplasia)    COVID-19 09/19/2018   COVID-19 virus infection 09/18/2018   Enlarged prostate Dx 2001   Hypertension  Dx at age 30   Lumbar radiculopathy    Open angle with borderline findings and high glaucoma risk in left eye    Primary open angle glaucoma of right eye, severe stage     Family History  Problem Relation Age of Onset   Diabetes Mother    Asthma Father    Hypertension Father    Asthma Sister     Past Surgical History:  Procedure Laterality Date   CATARACT EXTRACTION W/ INTRAOCULAR LENS  IMPLANT, BILATERAL Bilateral    CORNEAL TRANSPLANT Right    EYE SURGERY     Social History   Occupational History   Not on file  Tobacco Use   Smoking status:  Former    Types: Cigarettes    Quit date: 09/21/1968    Years since quitting: 52.4   Smokeless tobacco: Never   Tobacco comments:    "quit smoking cigarettes in 1970"  Vaping Use   Vaping Use: Never used  Substance and Sexual Activity   Alcohol use: No   Drug use: No   Sexual activity: Not on file

## 2021-03-01 DIAGNOSIS — Z947 Corneal transplant status: Secondary | ICD-10-CM | POA: Diagnosis not present

## 2021-03-01 DIAGNOSIS — H16231 Neurotrophic keratoconjunctivitis, right eye: Secondary | ICD-10-CM | POA: Diagnosis not present

## 2021-03-01 DIAGNOSIS — T868411 Corneal transplant failure, right eye: Secondary | ICD-10-CM | POA: Diagnosis not present

## 2021-03-01 DIAGNOSIS — Z961 Presence of intraocular lens: Secondary | ICD-10-CM | POA: Diagnosis not present

## 2021-03-01 DIAGNOSIS — H4089 Other specified glaucoma: Secondary | ICD-10-CM | POA: Diagnosis not present

## 2021-03-03 ENCOUNTER — Other Ambulatory Visit: Payer: Self-pay

## 2021-03-20 ENCOUNTER — Other Ambulatory Visit: Payer: Self-pay

## 2021-03-20 ENCOUNTER — Other Ambulatory Visit: Payer: Self-pay | Admitting: Nurse Practitioner

## 2021-03-20 NOTE — Telephone Encounter (Signed)
Requested medications are due for refill today.  yes  Requested medications are on the active medications list.  yes  Last refill. 10/26/2020  Future visit scheduled.   no  Notes to clinic.  Prescription expired 03/15/2021.

## 2021-03-22 ENCOUNTER — Other Ambulatory Visit: Payer: Self-pay | Admitting: Pharmacist

## 2021-03-22 ENCOUNTER — Other Ambulatory Visit: Payer: Self-pay

## 2021-03-22 MED ORDER — ATORVASTATIN CALCIUM 40 MG PO TABS: 40.0000 mg | ORAL_TABLET | Freq: Every day | ORAL | 3 refills | Status: DC

## 2021-03-22 MED ORDER — ATORVASTATIN CALCIUM 40 MG PO TABS
40.0000 mg | ORAL_TABLET | Freq: Every day | ORAL | 3 refills | Status: DC
  Filled 2021-03-22 – 2021-06-16 (×2): qty 90, 90d supply, fill #0
  Filled 2021-09-15: qty 90, 90d supply, fill #1
  Filled 2021-12-05: qty 90, 90d supply, fill #2

## 2021-03-23 ENCOUNTER — Other Ambulatory Visit: Payer: Self-pay

## 2021-03-23 ENCOUNTER — Ambulatory Visit: Payer: Medicare Other | Admitting: Orthopedic Surgery

## 2021-04-03 ENCOUNTER — Other Ambulatory Visit: Payer: Self-pay

## 2021-04-07 ENCOUNTER — Other Ambulatory Visit: Payer: Self-pay

## 2021-04-07 DIAGNOSIS — T868411 Corneal transplant failure, right eye: Secondary | ICD-10-CM | POA: Diagnosis not present

## 2021-04-07 MED ORDER — ATROPINE SULFATE 1 % OP SOLN
OPHTHALMIC | 11 refills | Status: DC
  Filled 2021-04-07 – 2021-07-04 (×2): qty 5, 80d supply, fill #0
  Filled 2021-09-25: qty 5, 80d supply, fill #1
  Filled 2022-01-11: qty 4, 64d supply, fill #1

## 2021-04-07 MED ORDER — ERYTHROMYCIN 5 MG/GM OP OINT
TOPICAL_OINTMENT | OPHTHALMIC | 11 refills | Status: DC
  Filled 2021-04-07: qty 3.5, 10d supply, fill #0
  Filled 2021-04-18: qty 3.5, 10d supply, fill #1
  Filled 2021-05-19: qty 3.5, 10d supply, fill #2
  Filled 2021-07-04: qty 3.5, 10d supply, fill #0
  Filled 2021-09-25 – 2021-11-24 (×2): qty 3.5, 10d supply, fill #1

## 2021-04-07 MED FILL — Ibuprofen Tab 600 MG: ORAL | 30 days supply | Qty: 120 | Fill #0 | Status: AC

## 2021-04-14 ENCOUNTER — Other Ambulatory Visit: Payer: Self-pay

## 2021-04-17 ENCOUNTER — Other Ambulatory Visit: Payer: Self-pay

## 2021-04-17 DIAGNOSIS — H16231 Neurotrophic keratoconjunctivitis, right eye: Secondary | ICD-10-CM | POA: Diagnosis not present

## 2021-04-17 DIAGNOSIS — Z961 Presence of intraocular lens: Secondary | ICD-10-CM | POA: Diagnosis not present

## 2021-04-17 DIAGNOSIS — H1820 Unspecified corneal edema: Secondary | ICD-10-CM | POA: Diagnosis not present

## 2021-04-17 DIAGNOSIS — H40022 Open angle with borderline findings, high risk, left eye: Secondary | ICD-10-CM | POA: Diagnosis not present

## 2021-04-17 DIAGNOSIS — H401113 Primary open-angle glaucoma, right eye, severe stage: Secondary | ICD-10-CM | POA: Diagnosis not present

## 2021-04-18 ENCOUNTER — Other Ambulatory Visit: Payer: Self-pay

## 2021-04-19 ENCOUNTER — Emergency Department (HOSPITAL_COMMUNITY)
Admission: EM | Admit: 2021-04-19 | Discharge: 2021-04-20 | Disposition: A | Discharge status: Home / Self Care | Payer: Medicare Other | Attending: Emergency Medicine | Admitting: Emergency Medicine

## 2021-04-19 ENCOUNTER — Encounter (HOSPITAL_COMMUNITY): Payer: Self-pay | Admitting: Emergency Medicine

## 2021-04-19 ENCOUNTER — Other Ambulatory Visit: Payer: Self-pay

## 2021-04-19 ENCOUNTER — Emergency Department (HOSPITAL_COMMUNITY): Payer: Medicare Other

## 2021-04-19 DIAGNOSIS — Z7951 Long term (current) use of inhaled steroids: Secondary | ICD-10-CM | POA: Diagnosis not present

## 2021-04-19 DIAGNOSIS — Z87891 Personal history of nicotine dependence: Secondary | ICD-10-CM | POA: Diagnosis not present

## 2021-04-19 DIAGNOSIS — I10 Essential (primary) hypertension: Secondary | ICD-10-CM | POA: Diagnosis not present

## 2021-04-19 DIAGNOSIS — Z79899 Other long term (current) drug therapy: Secondary | ICD-10-CM | POA: Diagnosis not present

## 2021-04-19 DIAGNOSIS — R0789 Other chest pain: Secondary | ICD-10-CM | POA: Diagnosis not present

## 2021-04-19 DIAGNOSIS — J45909 Unspecified asthma, uncomplicated: Secondary | ICD-10-CM | POA: Insufficient documentation

## 2021-04-19 DIAGNOSIS — I2510 Atherosclerotic heart disease of native coronary artery without angina pectoris: Secondary | ICD-10-CM | POA: Diagnosis not present

## 2021-04-19 DIAGNOSIS — Z7982 Long term (current) use of aspirin: Secondary | ICD-10-CM | POA: Insufficient documentation

## 2021-04-19 DIAGNOSIS — R079 Chest pain, unspecified: Secondary | ICD-10-CM | POA: Diagnosis not present

## 2021-04-19 DIAGNOSIS — J449 Chronic obstructive pulmonary disease, unspecified: Secondary | ICD-10-CM | POA: Diagnosis not present

## 2021-04-19 DIAGNOSIS — Z8616 Personal history of COVID-19: Secondary | ICD-10-CM | POA: Insufficient documentation

## 2021-04-19 LAB — CBC WITH DIFFERENTIAL/PLATELET
Abs Immature Granulocytes: 0.01 10*3/uL (ref 0.00–0.07)
Basophils Absolute: 0.1 10*3/uL (ref 0.0–0.1)
Basophils Relative: 1 %
Eosinophils Absolute: 2.3 10*3/uL — ABNORMAL HIGH (ref 0.0–0.5)
Eosinophils Relative: 29 %
HCT: 39.1 % (ref 39.0–52.0)
Hemoglobin: 12.6 g/dL — ABNORMAL LOW (ref 13.0–17.0)
Immature Granulocytes: 0 %
Lymphocytes Relative: 20 %
Lymphs Abs: 1.6 10*3/uL (ref 0.7–4.0)
MCH: 28.3 pg (ref 26.0–34.0)
MCHC: 32.2 g/dL (ref 30.0–36.0)
MCV: 87.9 fL (ref 80.0–100.0)
Monocytes Absolute: 0.5 10*3/uL (ref 0.1–1.0)
Monocytes Relative: 7 %
Neutro Abs: 3.4 10*3/uL (ref 1.7–7.7)
Neutrophils Relative %: 43 %
Platelets: 212 10*3/uL (ref 150–400)
RBC: 4.45 MIL/uL (ref 4.22–5.81)
RDW: 12.8 % (ref 11.5–15.5)
WBC: 7.9 10*3/uL (ref 4.0–10.5)
nRBC: 0 % (ref 0.0–0.2)

## 2021-04-19 LAB — BASIC METABOLIC PANEL
Anion gap: 7 (ref 5–15)
BUN: 20 mg/dL (ref 8–23)
CO2: 23 mmol/L (ref 22–32)
Calcium: 8.8 mg/dL — ABNORMAL LOW (ref 8.9–10.3)
Chloride: 106 mmol/L (ref 98–111)
Creatinine, Ser: 1.38 mg/dL — ABNORMAL HIGH (ref 0.61–1.24)
GFR, Estimated: 53 mL/min — ABNORMAL LOW (ref >60)
Glucose, Bld: 145 mg/dL — ABNORMAL HIGH (ref 70–99)
Potassium: 3.9 mmol/L (ref 3.5–5.1)
Sodium: 136 mmol/L (ref 135–145)

## 2021-04-19 LAB — HEPATIC FUNCTION PANEL
ALT: 15 U/L (ref 0–44)
AST: 20 U/L (ref 15–41)
Albumin: 3.9 g/dL (ref 3.5–5.0)
Alkaline Phosphatase: 74 U/L (ref 38–126)
Bilirubin, Direct: 0.1 mg/dL (ref 0.0–0.2)
Indirect Bilirubin: 0.4 mg/dL (ref 0.3–0.9)
Total Bilirubin: 0.5 mg/dL (ref 0.3–1.2)
Total Protein: 7 g/dL (ref 6.5–8.1)

## 2021-04-19 LAB — D-DIMER, QUANTITATIVE: D-Dimer, Quant: 0.6 ug/mL-FEU — ABNORMAL HIGH (ref 0.00–0.50)

## 2021-04-19 LAB — TROPONIN I (HIGH SENSITIVITY): Troponin I (High Sensitivity): 6 ng/L (ref <18)

## 2021-04-19 NOTE — ED Provider Notes (Signed)
I provided a substantive portion of the care of this patient.  I personally performed the entirety of the medical decision making for this encounter.  EKG Interpretation  Date/Time:  Wednesday April 19 2021 21:13:30 EST Ventricular Rate:  67 PR Interval:  217 QRS Duration: 101 QT Interval:  408 QTC Calculation: 431 R Axis:   -58 Text Interpretation: Sinus rhythm Borderline prolonged PR interval Left anterior fascicular block Abnormal R-wave progression, late transition Nonspecific T abnormalities, lateral leads Confirmed by Lacretia Leigh ([id number]) on 04/19/2021 10:29:22 PM   75 year old male presents with intermittent right-sided chest pain was last for 1 second.  Symptoms wax and wane and are not associate with dyspnea or diaphoresis.  EKG without ischemic changes here.  Chest x-ray without acute findings.  Age-adjusted D-dimer negative as well.  For troponin negative.  Dissipate this is nonspecific chest pain and will likely discharge home after second troponin   Lacretia Leigh, MD 04/19/21 2251

## 2021-04-19 NOTE — ED Triage Notes (Addendum)
Patient reports chest pain 10/10, started in right arm around 3pm and migrated to chest, central chest area, denies n/v, denies injury

## 2021-04-19 NOTE — ED Provider Notes (Signed)
[facility] DEPT Provider Note   CSN: [id number] Arrival date & time: 04/19/21  2101     History Chief Complaint  Patient presents with   Chest Pain    Bryan Sullivan is a 75 y.o. male He is Arabic speaking but says he would prefer to use English as he feels fluent and son as Optometrist as needed.  HPI Patient is a 75 year old male with past medical history detailed below he is followed by cardiology with history of hypertension and CAD has never had a catheterization done however has been evaluated by cardiology with echocardiogram done in February 2022 with borderline/mildly reduced LVEF of approximately 50%.  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  Coronary CT calcium score mildly elevated mild stenosis.  He is being followed by cardiology  Patient is presented to emergency room today with brief approximately 1 second episodes of pain on the right upper side of his chest that are nonexertional seem to occur every 5 minutes have been ongoing for 6 hours prior to arrival in the ER.  No associated nausea diaphoresis or shortness of breath no cough hemoptysis lower extremity swelling denies any unilateral or bilateral leg swelling denies any history of VTE and states he is not a cancer patient nor is he had any recent hospitalization or prolonged immobilization or surgeries.  States he feels that the pain initially was episodic and 8/10 he took some Tylenol and his period of 5/10 currently.  No other associate symptoms.  No aggravating mitigating factors.     Past Medical History:  Diagnosis Date   Asthma    As child    BPH (benign prostatic hyperplasia)    COVID-19 09/19/2018   COVID-19 virus infection 09/18/2018   Enlarged prostate Dx 2001   Hypertension    Dx at age 56   Lumbar radiculopathy    Open angle with borderline findings and high glaucoma  risk in left eye    Primary open angle glaucoma of right eye, severe stage     Patient Active Problem List   Diagnosis Date Noted   Coronary artery disease 06/10/2020   Pure hypercholesterolemia 11/18/2018   Gastroesophageal reflux disease 10/08/2016   Glaucoma (increased eye pressure) 12/16/2014   BPH (benign prostatic hyperplasia) 11/24/2014   DJD (degenerative joint disease) of knee 11/24/2014   Chronic obstructive airway disease with asthma ([place]) 10/11/2014   Essential hypertension 09/22/2014   Open angle with borderline findings and high glaucoma risk in left eye 08/24/2014   After cataract 02/10/2013   Lumbar radiculopathy 12/30/2012   Pseudophakia of both eyes 07/29/2012   Corneal transplant failure 07/15/2012    Past Surgical History:  Procedure Laterality Date   CATARACT EXTRACTION W/ INTRAOCULAR LENS  IMPLANT, BILATERAL Bilateral    CORNEAL TRANSPLANT Right    EYE SURGERY         Family History  Problem Relation Age of Onset   Diabetes Mother    Asthma Father    Hypertension Father    Asthma Sister     Social History   Tobacco Use   Smoking status: Former    Types: Cigarettes    Quit date: 09/21/1968    Years since quitting: 52.6   Smokeless tobacco: Never   Tobacco comments:    "quit smoking cigarettes in 1970"  Vaping Use   Vaping Use: Never used  Substance Use Topics   Alcohol use: No  Drug use: No    Home Medications Prior to Admission medications   Medication Sig Start Date End Date Taking? Authorizing Provider  acetaminophen (TYLENOL) 500 MG tablet Take 500 mg by mouth as needed.    [provider]  albuterol (PROVENTIL) (2.5 MG/3ML) 0.083% nebulizer solution Take 3 mLs (2.5 mg total) by nebulization every 6 (six) hours as needed for wheezing or shortness of breath. 12/19/20 03/19/21  Charlott Rakes, MD  albuterol (VENTOLIN HFA) 108 (90 Base) MCG/ACT inhaler Take one by mouth puff every 4-6 hours 12/19/20   Elsie Stain, MD   amLODipine (NORVASC) 2.5 MG tablet Take one tablet (2.5 mg dose) by mouth every 12 (twelve) hours. Hold if systolic blood pressure less than 115 12/08/20     aspirin 81 MG EC tablet Take 1 tablet (81 mg total) by mouth daily. 10/26/20   Gildardo Pounds, NP  atorvastatin (LIPITOR) 40 MG tablet Take 1 tablet (40 mg total) by mouth daily. 03/22/21 06/20/21  Charlott Rakes, MD  atropine 1 % ophthalmic solution Place 1 drop into the right eye once daily 04/07/21     Budeson-Glycopyrrol-Formoterol (BREZTRI AEROSPHERE) 160-9-4.8 MCG/ACT AERO Inhale 2 puffs into the lungs in the morning and at bedtime. 12/19/20   Elsie Stain, MD  diclofenac Sodium (VOLTAREN) 1 % GEL APPLY 4 G TOPICALLY 4 (FOUR) TIMES DAILY. 10/26/20 10/26/21  Gildardo Pounds, NP  dorzolamide (TRUSOPT) 2 % ophthalmic solution Place 1 drop into the right eye 3 (three) times daily. 07/21/18   Gildardo Pounds, NP  dorzolamide (TRUSOPT) 2 % ophthalmic solution Instill 1 drop Right Eye 3 times a day 01/23/21     doxycycline (MONODOX) 50 MG capsule Take 1 capsule (50 mg total) by mouth once daily 12/07/20     erythromycin ophthalmic ointment Use 4 times a day and at bedtime 04/07/21     famotidine (PEPCID) 20 MG tablet TAKE 1 TABLET (20 MG) BY MOUTH DAILY AT BEDTIME AS NEEDED 01/24/21 01/24/22  Charlott Rakes, MD  finasteride (PROSCAR) 5 MG tablet Take 1 tablet (5 mg total) by mouth daily. 10/26/20   Gildardo Pounds, NP  fluorometholone (FML) 0.1 % ophthalmic suspension INSTILL 1 DROP INTO RIGHT EYE TWICE A DAY 11/07/20     fluticasone (FLONASE) 50 MCG/ACT nasal spray Place 2 sprays into both nostrils daily. 11/18/18   Elsie Stain, MD  ibuprofen (ADVIL) 600 MG tablet TAKE 1 TABLET (600 MG TOTAL) BY MOUTH EVERY 6 (SIX) HOURS AS NEEDED FOR MODERATE PAIN. 06/14/20 06/14/21  Jessy Oto, MD  isosorbide mononitrate (IMDUR) 30 MG 24 hr tablet TAKE ONE TABLET (30 MG DOSE) BY MOUTH DAILY. 12/19/20 12/19/21  Elsie Stain, MD  latanoprost (XALATAN)  0.005 % ophthalmic solution Instill 1 drop Both Eyes at bedtime 11/07/20     metoprolol succinate (TOPROL-XL) 25 MG 24 hr tablet Take one tablet (25 mg dose) by mouth daily. 10/26/20     ofloxacin (OCUFLOX) 0.3 % ophthalmic solution Place 1 drop into the right eye 3 (three) times a day for 30 days 12/14/20     omeprazole (PRILOSEC) 20 MG capsule Take 1 capsule (20 mg total) by mouth daily. 02/16/21 05/17/21  Charlott Rakes, MD  tamsulosin (FLOMAX) 0.4 MG CAPS capsule Take 2 capsules (0.8 mg total) by mouth daily. 10/26/20   Gildardo Pounds, NP  timolol (TIMOPTIC) 0.5 % ophthalmic solution Place 1 drop into both eyes 2 (two) times daily. 10/26/20   Gildardo Pounds, NP  valsartan (DIOVAN) 80 MG tablet Take 1 tablet (80 mg total) by mouth daily. 12/19/20 07/19/21  Elsie Stain, MD    Allergies    Patient has no known allergies.  Review of Systems   Review of Systems  Constitutional:  Negative for chills and fever.  HENT:  Negative for congestion.   Eyes:  Negative for pain.  Respiratory:  Negative for cough and shortness of breath.   Cardiovascular:  Positive for chest pain. Negative for leg swelling.  Gastrointestinal:  Negative for abdominal pain and vomiting.  Genitourinary:  Negative for dysuria.  Musculoskeletal:  Negative for myalgias.  Skin:  Negative for rash.  Neurological:  Negative for dizziness and headaches.   Physical Exam Updated Vital Signs BP (!) 142/66   Pulse 61   Temp 98.4 F (36.9 C) (Oral)   Resp 13   Ht 5\' 10"  (1.778 m)   Wt 85.3 kg   SpO2 100%   BMI 26.98 kg/m   Physical Exam Vitals and nursing note reviewed.  Constitutional:      General: He is not in acute distress.    Comments: Pleasant 75 year old male in no acute distress able answer questions appropriately follow commands occasionally using son as interpreter  HENT:     Head: Normocephalic and atraumatic.     Nose: Nose normal.  Eyes:     General: No scleral icterus. Cardiovascular:     Rate  and Rhythm: Normal rate and regular rhythm.     Pulses: Normal pulses.     Heart sounds: Normal heart sounds.     Comments: Bilateral radial artery pulses 2+ and symmetric bilateral dorsalis pedis pulses 2+ and symmetric Pulmonary:     Effort: Pulmonary effort is normal. No respiratory distress.     Breath sounds: No wheezing.  Abdominal:     Palpations: Abdomen is soft.     Tenderness: There is no abdominal tenderness. There is no guarding or rebound.  Musculoskeletal:     Cervical back: Normal range of motion.     Right lower leg: No edema.     Left lower leg: No edema.  Skin:    General: Skin is warm and dry.     Capillary Refill: Capillary refill takes less than 2 seconds.  Neurological:     Mental Status: He is alert. Mental status is at baseline.     Comments: Sensation intact in all 4 extremities  Psychiatric:        Mood and Affect: Mood normal.        Behavior: Behavior normal.    ED Results / Procedures / Treatments   Labs (all labs ordered are listed, but only abnormal results are displayed) Labs Reviewed  BASIC METABOLIC PANEL - Abnormal; Notable for the following components:      Result Value   Glucose, Bld 145 (*)    Creatinine, Ser 1.38 (*)    Calcium 8.8 (*)    GFR, Estimated 53 (*)    All other components within normal limits  CBC WITH DIFFERENTIAL/PLATELET - Abnormal; Notable for the following components:   Hemoglobin 12.6 (*)    Eosinophils Absolute 2.3 (*)    All other components within normal limits  D-DIMER, QUANTITATIVE (NOT AT [facility]) - Abnormal; Notable for the following components:   D-Dimer, Quant 0.60 (*)    All other components within normal limits  HEPATIC FUNCTION PANEL  TROPONIN I (HIGH SENSITIVITY)  TROPONIN I (HIGH SENSITIVITY)    EKG EKG Interpretation  Date/Time:  Wednesday April 19 2021 21:13:30 EST Ventricular Rate:  67 PR Interval:  217 QRS Duration: 101 QT Interval:  408 QTC Calculation: 431 R Axis:   -58 Text  Interpretation: Sinus rhythm Borderline prolonged PR interval Left anterior fascicular block Abnormal R-wave progression, late transition Nonspecific T abnormalities, lateral leads Confirmed by Lacretia Leigh ([id number]) on 04/19/2021 10:38:08 PM  Radiology DG Chest 2 View  Result Date: 04/19/2021 CLINICAL DATA:  A 75 year old male presents for evaluation of chest pain that began around 3 p.m. EXAM: CHEST - 2 VIEW COMPARISON:  September 18, 2018. FINDINGS: Trachea is midline. Cardiomediastinal contours and hilar structures are normal. Lungs are clear. No pneumothorax or sign of pleural effusion. On limited assessment there is no acute skeletal process. IMPRESSION: No acute cardiopulmonary disease. Electronically Signed   By: Zetta Bills M.D.   On: 04/19/2021 21:58    Procedures Procedures   Medications Ordered in ED Medications - No data to display  ED Course  I have reviewed the triage vital signs and the nursing notes.  Pertinent labs & imaging results that were available during my care of the patient were reviewed by me and considered in my medical decision making (see chart for details).    MDM Rules/Calculators/A&P                          Patient is a 75 year old male presented to the emergency room today for atypical chest pain.  He does have a history of nonobstructive CAD seems to be followed by cardiology for this is not a smoker has no exertional symptoms no concerning associated symptoms nonpleuritic symptoms  Given patient's age and unusual symptoms will obtain labs including troponin, delta troponin, D-dimer chest x-ray LFTs basic labs  Work-up today looks well.  D-dimer corrects to normal.  BMP without significant change from baseline.  CBC without leukocytosis or significant anemia.  LFTs unremarkable.  Troponin x2 within normal limits with no significant delta.  Chest x-ray unremarkable EKG nonischemic intraventricular delay unchanged from prior reviewed by Dr. Zenia Resides  I  discussed this case with my attending physician who cosigned this note including patient's presenting symptoms, physical exam, and planned diagnostics and interventions. Attending physician stated agreement with plan or made changes to plan which were implemented.   Attending physician assessed patient at bedside.   We will discharge patient home he states his pain continues to improve. Follow-up with cardiology.  Return precautions given.  Final Clinical Impression(s) / ED Diagnoses Final diagnoses:  Atypical chest pain    Rx / DC Orders ED Discharge Orders     None        Tedd Sias, [place] 04/20/21 2353    Lacretia Leigh, MD 04/25/21 1515

## 2021-04-20 LAB — TROPONIN I (HIGH SENSITIVITY): Troponin I (High Sensitivity): 5 ng/L (ref <18)

## 2021-04-20 NOTE — Discharge Instructions (Addendum)
Please monitor your symptoms you may use Tylenol as needed for pain 650 mg every 6 hours.  Please follow-up with your primary care doctor and cardiologist.  If you begin having worsening chest pain or any difficulty breathing, nausea, sweating, vomiting, coughing up blood or any other new or concerning symptoms which is a fever or other please return to the emergency room for reevaluation.  Your work-up here today was quite reassuring.

## 2021-04-24 ENCOUNTER — Other Ambulatory Visit: Payer: Self-pay

## 2021-04-24 ENCOUNTER — Other Ambulatory Visit: Payer: Self-pay | Admitting: Family Medicine

## 2021-04-24 DIAGNOSIS — K219 Gastro-esophageal reflux disease without esophagitis: Secondary | ICD-10-CM

## 2021-04-25 MED ORDER — OMEPRAZOLE 20 MG PO CPDR
20.0000 mg | DELAYED_RELEASE_CAPSULE | Freq: Every day | ORAL | 0 refills | Status: DC
  Filled 2021-04-25: qty 90, 90d supply, fill #0

## 2021-04-25 NOTE — Telephone Encounter (Signed)
Requested Prescriptions  Pending Prescriptions Disp Refills  . omeprazole (PRILOSEC) 20 MG capsule 90 capsule 0    Sig: Take 1 capsule (20 mg total) by mouth daily.     Gastroenterology: Proton Pump Inhibitors Passed - 04/24/2021  3:19 PM      Passed - Valid encounter within last 12 months    Recent Outpatient Visits          4 months ago Chronic obstructive airway disease with asthma [facility])   [place] Elsie Stain, MD   2 years ago Chronic obstructive airway disease with asthma [facility])   [place] Elsie Stain, MD   2 years ago [place], Patrick E, MD   2 years ago COVID-30 virus infection   [place] Elsie Stain, MD   2 years ago COVID-63 virus infection   [place], MD      Future Appointments            In 1 month Gildardo Pounds, NP [place]

## 2021-04-26 ENCOUNTER — Other Ambulatory Visit: Payer: Self-pay

## 2021-05-03 ENCOUNTER — Other Ambulatory Visit: Payer: Self-pay

## 2021-05-03 ENCOUNTER — Other Ambulatory Visit: Payer: Self-pay | Admitting: Family Medicine

## 2021-05-03 ENCOUNTER — Other Ambulatory Visit: Payer: Self-pay | Admitting: Nurse Practitioner

## 2021-05-03 MED ORDER — ISOSORBIDE MONONITRATE ER 30 MG PO TB24
ORAL_TABLET | ORAL | 0 refills | Status: DC
Start: 2021-05-03 — End: 2022-04-16
  Filled 2021-05-03: qty 45, 45d supply, fill #0
  Filled 2021-05-31: qty 30, 30d supply, fill #0
  Filled 2021-07-04: qty 15, 15d supply, fill #0

## 2021-05-03 MED ORDER — METOPROLOL SUCCINATE ER 25 MG PO TB24
ORAL_TABLET | ORAL | 0 refills | Status: DC
  Filled 2021-05-03: qty 45, 45d supply, fill #0

## 2021-05-03 MED ORDER — FAMOTIDINE 20 MG PO TABS
ORAL_TABLET | ORAL | 0 refills | Status: DC
  Filled 2021-05-03: qty 90, 90d supply, fill #0

## 2021-05-03 NOTE — Telephone Encounter (Signed)
Requested Prescriptions  Pending Prescriptions Disp Refills  . famotidine (PEPCID) 20 MG tablet 90 tablet 0    Sig: TAKE 1 TABLET (20 MG) BY MOUTH DAILY AT BEDTIME AS NEEDED     Gastroenterology:  H2 Antagonists Passed - 05/03/2021  1:49 PM      Passed - Valid encounter within last 12 months    Recent Outpatient Visits          4 months ago Chronic obstructive airway disease with asthma ([place])   [place] Elsie Stain, MD   2 years ago Chronic obstructive airway disease with asthma [facility])   [place] Elsie Stain, MD   2 years ago [place], MD   2 years ago COVID-57 virus infection   [place] Elsie Stain, MD   2 years ago COVID-63 virus infection   [place], MD      Future Appointments            In 4 weeks Gildardo Pounds, NP [place]

## 2021-05-04 ENCOUNTER — Other Ambulatory Visit: Payer: Self-pay

## 2021-05-08 DIAGNOSIS — S0501XD Injury of conjunctiva and corneal abrasion without foreign body, right eye, subsequent encounter: Secondary | ICD-10-CM | POA: Diagnosis not present

## 2021-05-08 DIAGNOSIS — H40023 Open angle with borderline findings, high risk, bilateral: Secondary | ICD-10-CM | POA: Diagnosis not present

## 2021-05-19 ENCOUNTER — Other Ambulatory Visit: Payer: Self-pay

## 2021-05-31 ENCOUNTER — Other Ambulatory Visit: Payer: Self-pay

## 2021-05-31 ENCOUNTER — Encounter: Payer: Self-pay | Admitting: Nurse Practitioner

## 2021-05-31 ENCOUNTER — Ambulatory Visit: Payer: Medicare Other | Attending: Nurse Practitioner | Admitting: Nurse Practitioner

## 2021-05-31 VITALS — BP 134/78 | HR 79 | Ht 70.0 in | Wt 188.1 lb

## 2021-05-31 DIAGNOSIS — R3914 Feeling of incomplete bladder emptying: Secondary | ICD-10-CM

## 2021-05-31 DIAGNOSIS — K219 Gastro-esophageal reflux disease without esophagitis: Secondary | ICD-10-CM

## 2021-05-31 DIAGNOSIS — R7303 Prediabetes: Secondary | ICD-10-CM

## 2021-05-31 DIAGNOSIS — H409 Unspecified glaucoma: Secondary | ICD-10-CM | POA: Diagnosis not present

## 2021-05-31 DIAGNOSIS — I10 Essential (primary) hypertension: Secondary | ICD-10-CM | POA: Diagnosis not present

## 2021-05-31 DIAGNOSIS — N401 Enlarged prostate with lower urinary tract symptoms: Secondary | ICD-10-CM

## 2021-05-31 DIAGNOSIS — J449 Chronic obstructive pulmonary disease, unspecified: Secondary | ICD-10-CM

## 2021-05-31 DIAGNOSIS — Z23 Encounter for immunization: Secondary | ICD-10-CM | POA: Diagnosis not present

## 2021-05-31 LAB — POCT GLYCOSYLATED HEMOGLOBIN (HGB A1C): Hemoglobin A1C: 6.5 % — AB (ref 4.0–5.6)

## 2021-05-31 LAB — GLUCOSE, POCT (MANUAL RESULT ENTRY): POC Glucose: 128 mg/dl — AB (ref 70–99)

## 2021-05-31 MED ORDER — BREZTRI AEROSPHERE 160-9-4.8 MCG/ACT IN AERO
2.0000 | INHALATION_SPRAY | Freq: Two times a day (BID) | RESPIRATORY_TRACT | 4 refills | Status: DC
  Filled 2021-05-31 – 2021-08-03 (×3): qty 10.7, 30d supply, fill #0
  Filled 2021-11-24: qty 10.7, 30d supply, fill #1
  Filled 2022-01-15: qty 10.7, 30d supply, fill #2
  Filled 2022-04-11: qty 10.7, 30d supply, fill #3

## 2021-05-31 MED ORDER — ALBUTEROL SULFATE HFA 108 (90 BASE) MCG/ACT IN AERS
INHALATION_SPRAY | RESPIRATORY_TRACT | 1 refills | Status: DC
Start: 2021-05-31 — End: 2021-09-05
  Filled 2021-05-31: qty 18, fill #0
  Filled 2021-07-04: qty 18, 16d supply, fill #0
  Filled 2021-08-03: qty 18, 16d supply, fill #1

## 2021-05-31 MED ORDER — LATANOPROST 0.005 % OP SOLN
OPHTHALMIC | 3 refills | Status: DC
Start: 2021-05-31 — End: 2022-03-27
  Filled 2021-05-31: qty 2.5, fill #0
  Filled 2021-05-31 – 2021-08-03 (×2): qty 2.5, 20d supply, fill #0
  Filled 2021-08-24: qty 2.5, 20d supply, fill #1
  Filled 2021-09-22 – 2021-11-24 (×2): qty 2.5, 20d supply, fill #2

## 2021-05-31 MED ORDER — ALBUTEROL SULFATE (2.5 MG/3ML) 0.083% IN NEBU
2.5000 mg | INHALATION_SOLUTION | Freq: Four times a day (QID) | RESPIRATORY_TRACT | 1 refills | Status: DC | PRN
  Filled 2021-05-31 – 2021-08-25 (×4): qty 150, 13d supply, fill #0

## 2021-05-31 MED ORDER — VALSARTAN 80 MG PO TABS
80.0000 mg | ORAL_TABLET | Freq: Every day | ORAL | 2 refills | Status: DC
  Filled 2021-05-31 – 2021-08-03 (×2): qty 90, 90d supply, fill #0
  Filled 2021-11-24 – 2021-12-01 (×2): qty 90, 90d supply, fill #1
  Filled 2022-03-16: qty 90, 90d supply, fill #2

## 2021-05-31 MED ORDER — FAMOTIDINE 20 MG PO TABS
20.0000 mg | ORAL_TABLET | Freq: Every day | ORAL | 1 refills | Status: DC
  Filled 2021-05-31 – 2021-08-03 (×2): qty 90, 90d supply, fill #0
  Filled 2021-09-22: qty 90, 90d supply, fill #1

## 2021-05-31 MED ORDER — TAMSULOSIN HCL 0.4 MG PO CAPS
0.8000 mg | ORAL_CAPSULE | Freq: Every day | ORAL | 3 refills | Status: DC
  Filled 2021-06-16 – 2021-07-04 (×2): qty 60, 30d supply, fill #0
  Filled 2021-08-24: qty 60, 30d supply, fill #1
  Filled 2021-09-22: qty 60, 30d supply, fill #2

## 2021-05-31 NOTE — Patient Instructions (Signed)
BIOTENE PRODUCTS FOR DRY MOUTH

## 2021-05-31 NOTE — Progress Notes (Signed)
Assessment & Plan:  Bryan Sullivan was seen today for prediabetes.  Diagnoses and all orders for this visit:  Essential hypertension -     valsartan (DIOVAN) 80 MG tablet; Take 1 tablet (80 mg total) by mouth daily.  Prediabetes -     POCT glucose (manual entry) -     POCT glycosylated hemoglobin (Hb A1C)  Benign prostatic hyperplasia with incomplete bladder emptying -     tamsulosin (FLOMAX) 0.4 MG CAPS capsule; Take 2 capsules (0.8 mg total) by mouth daily.  Chronic obstructive airway disease with asthma ([facility]) -     Budeson-Glycopyrrol-Formoterol (BREZTRI AEROSPHERE) 160-9-4.8 MCG/ACT AERO; Inhale 2 puffs into the lungs in the morning and at bedtime. -     albuterol (PROVENTIL) (2.5 MG/3ML) 0.083% nebulizer solution; Take 3 mLs (2.5 mg total) by nebulization every 6 (six) hours as needed for wheezing or shortness of breath. -     albuterol (VENTOLIN HFA) 108 (90 Base) MCG/ACT inhaler; Take one by mouth puff every 4-6 hours -     For home use only DME Nebulizer machine  Glaucoma of right eye, unspecified glaucoma type -     latanoprost (XALATAN) 0.005 % ophthalmic solution; Instill 1 drop Both Eyes at bedtime  GERD without esophagitis -     famotidine (PEPCID) 20 MG tablet; Take 1 tablet (20 mg total) by mouth daily.    Patient has been counseled on age-appropriate routine health concerns for screening and prevention. These are reviewed and up-to-date. Referrals have been placed accordingly. Immunizations are up-to-date or declined.    Subjective:   Chief Complaint  Patient presents with   Prediabetes   HPI Bryan Sullivan 76 y.o. male presents to office today for follow up to HTN and prediabetes He has a past medical history of Asthma, BPH (benign prostatic hyperplasia), COVID-19 (09/19/2018), COVID-19 virus infection (09/18/2018), Enlarged prostate (Dx 2001), Hypertension, Lumbar radiculopathy, Open angle with borderline findings and high glaucoma risk in left eye, and  Primary open angle glaucoma of right eye, severe stage (currently being followed by ophthalmology).     HTN Blood pressure is well controlled with toprol XL 25 mg daily, imdur 30 mg daily, valsartan 80 mg daily and amlodipine 2.5 mg daily.  BP Readings from Last 3 Encounters:  05/31/21 134/78  04/20/21 (!) 142/66  02/23/21 (!) 180/91    DM 2 Well controlled at this time. Will not start any diabetic medications unless A1c 7 or greater.  Lab Results  Component Value Date   HGBA1C 6.5 (A) 05/31/2021   COPD/Asthma Symptoms well managed. He was giving a script for nebulizer machine today.   Review of Systems  Constitutional:  Negative for fever, malaise/fatigue and weight loss.  HENT: Negative.  Negative for nosebleeds.        Dry mouth  Eyes:  Positive for blurred vision. Negative for double vision, photophobia, pain and discharge.  Respiratory: Negative.  Negative for cough, sputum production, shortness of breath and wheezing.   Cardiovascular: Negative.  Negative for chest pain, palpitations and leg swelling.  Gastrointestinal:  Positive for heartburn (controlled with pepcid prn). Negative for nausea and vomiting.  Genitourinary:  Negative for dysuria, flank pain, frequency (BPH), hematuria and urgency.  Musculoskeletal: Negative.  Negative for myalgias.  Neurological: Negative.  Negative for dizziness, focal weakness, seizures and headaches.  Psychiatric/Behavioral: Negative.  Negative for suicidal ideas.    Past Medical History:  Diagnosis Date   Asthma    As child    BPH (benign prostatic  hyperplasia)    COVID-19 09/19/2018   COVID-19 virus infection 09/18/2018   Enlarged prostate Dx 2001   Hypertension    Dx at age 56   Lumbar radiculopathy    Open angle with borderline findings and high glaucoma risk in left eye    Primary open angle glaucoma of right eye, severe stage     Past Surgical History:  Procedure Laterality Date   CATARACT EXTRACTION W/ INTRAOCULAR LENS   IMPLANT, BILATERAL Bilateral    CORNEAL TRANSPLANT Right    EYE SURGERY      Family History  Problem Relation Age of Onset   Diabetes Mother    Asthma Father    Hypertension Father    Asthma Sister     Social History Reviewed with no changes to be made today.   Outpatient Medications Prior to Visit  Medication Sig Dispense Refill   amLODipine (NORVASC) 2.5 MG tablet Take one tablet (2.5 mg dose) by mouth every 12 (twelve) hours. Hold if systolic blood pressure less than 115 60 tablet 6   aspirin 81 MG EC tablet Take 1 tablet (81 mg total) by mouth daily. 30 tablet 0   atorvastatin (LIPITOR) 40 MG tablet Take 1 tablet (40 mg total) by mouth daily. 90 tablet 3   atropine 1 % ophthalmic solution Place 1 drop into the right eye once daily 2 mL 11   diclofenac Sodium (VOLTAREN) 1 % GEL APPLY 4 G TOPICALLY 4 (FOUR) TIMES DAILY. 350 g 3   dorzolamide (TRUSOPT) 2 % ophthalmic solution Place 1 drop into the right eye 3 (three) times daily. 30 mL 3   dorzolamide (TRUSOPT) 2 % ophthalmic solution Instill 1 drop Right Eye 3 times a day 10 mL 11   erythromycin ophthalmic ointment Use 4 times a day and at bedtime 3.5 g 11   finasteride (PROSCAR) 5 MG tablet Take 1 tablet (5 mg total) by mouth daily. 30 tablet 6   fluorometholone (FML) 0.1 % ophthalmic suspension INSTILL 1 DROP INTO RIGHT EYE TWICE A DAY 5 mL 11   fluticasone (FLONASE) 50 MCG/ACT nasal spray Place 2 sprays into both nostrils daily. 16 g 2   isosorbide mononitrate (IMDUR) 30 MG 24 hr tablet TAKE ONE TABLET (30 MG DOSE) BY MOUTH DAILY. 30 tablet 5   isosorbide mononitrate (IMDUR) 30 MG 24 hr tablet Take one tablet (30 mg dose) by mouth daily. 45 tablet 0   metoprolol succinate (TOPROL-XL) 25 MG 24 hr tablet Take one tablet (25 mg dose) by mouth daily. 45 tablet 0   ofloxacin (OCUFLOX) 0.3 % ophthalmic solution Place 1 drop into the right eye 3 (three) times a day for 30 days 10 mL 0   omeprazole (PRILOSEC) 20 MG capsule Take 1 capsule  (20 mg total) by mouth daily. 90 capsule 0   timolol (TIMOPTIC) 0.5 % ophthalmic solution Place 1 drop into both eyes 2 (two) times daily. 20 mL 3   albuterol (VENTOLIN HFA) 108 (90 Base) MCG/ACT inhaler Take one by mouth puff every 4-6 hours 25.5 g 0   Budeson-Glycopyrrol-Formoterol (BREZTRI AEROSPHERE) 160-9-4.8 MCG/ACT AERO Inhale 2 puffs into the lungs in the morning and at bedtime. 10.7 g 4   famotidine (PEPCID) 20 MG tablet TAKE 1 TABLET (20 MG) BY MOUTH DAILY AT BEDTIME AS NEEDED 90 tablet 0   latanoprost (XALATAN) 0.005 % ophthalmic solution Instill 1 drop Both Eyes at bedtime 2.5 mL 3   tamsulosin (FLOMAX) 0.4 MG CAPS capsule Take 2 capsules (  0.8 mg total) by mouth daily. 60 capsule 3   valsartan (DIOVAN) 80 MG tablet Take 1 tablet (80 mg total) by mouth daily. 90 tablet 2   acetaminophen (TYLENOL) 500 MG tablet Take 500 mg by mouth as needed.     doxycycline (MONODOX) 50 MG capsule Take 1 capsule (50 mg total) by mouth once daily 30 capsule 3   ibuprofen (ADVIL) 600 MG tablet TAKE 1 TABLET (600 MG TOTAL) BY MOUTH EVERY 6 (SIX) HOURS AS NEEDED FOR MODERATE PAIN. (Patient not taking: Reported on 05/31/2021) 120 tablet 3   albuterol (PROVENTIL) (2.5 MG/3ML) 0.083% nebulizer solution Take 3 mLs (2.5 mg total) by nebulization every 6 (six) hours as needed for wheezing or shortness of breath. 150 mL 1   No facility-administered medications prior to visit.    No Known Allergies     Objective:    BP 134/78    Pulse 79    Ht 5\' 10"  (1.778 m)    Wt 188 lb 2 oz (85.3 kg)    SpO2 100%    BMI 26.99 kg/m  Wt Readings from Last 3 Encounters:  05/31/21 188 lb 2 oz (85.3 kg)  04/19/21 188 lb (85.3 kg)  02/23/21 186 lb (84.4 kg)    Physical Exam Vitals and nursing note reviewed.  Constitutional:      Appearance: He is well-developed.  HENT:     Head: Normocephalic and atraumatic.  Cardiovascular:     Rate and Rhythm: Normal rate and regular rhythm.     Heart sounds: Normal heart sounds. No  murmur heard.   No friction rub. No gallop.  Pulmonary:     Effort: Pulmonary effort is normal. No tachypnea or respiratory distress.     Breath sounds: Normal breath sounds. No decreased breath sounds, wheezing, rhonchi or rales.  Chest:     Chest wall: No tenderness.  Abdominal:     General: Bowel sounds are normal.     Palpations: Abdomen is soft.  Musculoskeletal:        General: Normal range of motion.     Cervical back: Normal range of motion.  Skin:    General: Skin is warm and dry.  Neurological:     Mental Status: He is alert and oriented to person, place, and time.     Coordination: Coordination normal.  Psychiatric:        Behavior: Behavior normal. Behavior is cooperative.        Thought Content: Thought content normal.        Judgment: Judgment normal.         Patient has been counseled extensively about nutrition and exercise as well as the importance of adherence with medications and regular follow-up. The patient was given clear instructions to go to ER or return to medical center if symptoms don't improve, worsen or new problems develop. The patient verbalized understanding.   Follow-up: Return for see me in 3 months. See DR. Wright in 4-6 months.   Gildardo Pounds, FNP-BC [facility] and [place], [place]   06/01/2021, 12:44 PM

## 2021-06-01 ENCOUNTER — Encounter: Payer: Self-pay | Admitting: Nurse Practitioner

## 2021-06-07 DIAGNOSIS — H16231 Neurotrophic keratoconjunctivitis, right eye: Secondary | ICD-10-CM | POA: Diagnosis not present

## 2021-06-12 DIAGNOSIS — I2510 Atherosclerotic heart disease of native coronary artery without angina pectoris: Secondary | ICD-10-CM | POA: Diagnosis not present

## 2021-06-12 DIAGNOSIS — I10 Essential (primary) hypertension: Secondary | ICD-10-CM | POA: Diagnosis not present

## 2021-06-12 DIAGNOSIS — R0789 Other chest pain: Secondary | ICD-10-CM | POA: Diagnosis not present

## 2021-06-16 ENCOUNTER — Other Ambulatory Visit: Payer: Self-pay | Admitting: Nurse Practitioner

## 2021-06-16 ENCOUNTER — Other Ambulatory Visit: Payer: Self-pay

## 2021-06-16 MED ORDER — DICLOFENAC SODIUM 1 % EX GEL
4.0000 g | Freq: Four times a day (QID) | CUTANEOUS | 3 refills | Status: DC
  Filled 2021-06-16: qty 300, 21d supply, fill #0
  Filled 2021-07-05: qty 300, 21d supply, fill #1
  Filled 2021-08-24: qty 300, 21d supply, fill #2
  Filled 2021-12-21 – 2022-01-15 (×2): qty 300, 21d supply, fill #3

## 2021-06-19 ENCOUNTER — Other Ambulatory Visit: Payer: Self-pay

## 2021-07-04 ENCOUNTER — Other Ambulatory Visit: Payer: Self-pay

## 2021-07-05 ENCOUNTER — Other Ambulatory Visit: Payer: Self-pay

## 2021-07-06 ENCOUNTER — Telehealth: Payer: Self-pay

## 2021-07-06 ENCOUNTER — Other Ambulatory Visit: Payer: Self-pay

## 2021-07-06 NOTE — Telephone Encounter (Signed)
Pt picked up Rx on 07/04/2021

## 2021-07-06 NOTE — Telephone Encounter (Signed)
°  Encourage patient to contact the pharmacy for refills or they can request refills through [place]: 05/31/21  NEXT APPOINTMENT DATE:   MEDICATION: albuterol (PROVENTIL) (2.5 MG/3ML) 0.083% nebulizer solution  PHARMACY: [facility] at [place] states the Albuterol needs to be approved my [place] in order to get a refill

## 2021-07-17 DIAGNOSIS — H16231 Neurotrophic keratoconjunctivitis, right eye: Secondary | ICD-10-CM | POA: Diagnosis not present

## 2021-08-03 ENCOUNTER — Other Ambulatory Visit: Payer: Self-pay | Admitting: Nurse Practitioner

## 2021-08-03 ENCOUNTER — Other Ambulatory Visit: Payer: Self-pay

## 2021-08-03 DIAGNOSIS — K219 Gastro-esophageal reflux disease without esophagitis: Secondary | ICD-10-CM

## 2021-08-03 MED ORDER — OMEPRAZOLE 20 MG PO CPDR
20.0000 mg | DELAYED_RELEASE_CAPSULE | Freq: Every day | ORAL | 0 refills | Status: DC
  Filled 2021-08-03: qty 90, 90d supply, fill #0

## 2021-08-04 ENCOUNTER — Other Ambulatory Visit: Payer: Self-pay

## 2021-08-24 ENCOUNTER — Other Ambulatory Visit: Payer: Self-pay

## 2021-08-24 ENCOUNTER — Other Ambulatory Visit: Payer: Self-pay | Admitting: Nurse Practitioner

## 2021-08-24 MED ORDER — FINASTERIDE 5 MG PO TABS
5.0000 mg | ORAL_TABLET | Freq: Every day | ORAL | 0 refills | Status: DC
  Filled 2021-08-24: qty 30, 30d supply, fill #0

## 2021-08-25 ENCOUNTER — Other Ambulatory Visit: Payer: Self-pay

## 2021-08-25 ENCOUNTER — Telehealth: Payer: Medicare Other

## 2021-08-25 NOTE — Telephone Encounter (Signed)
Medication refill

## 2021-08-26 ENCOUNTER — Other Ambulatory Visit: Payer: Self-pay | Admitting: Nurse Practitioner

## 2021-08-26 DIAGNOSIS — J449 Chronic obstructive pulmonary disease, unspecified: Secondary | ICD-10-CM

## 2021-08-26 MED ORDER — ALBUTEROL SULFATE (2.5 MG/3ML) 0.083% IN NEBU: 2.5000 mg | INHALATION_SOLUTION | Freq: Four times a day (QID) | RESPIRATORY_TRACT | 1 refills | Status: DC | PRN

## 2021-08-26 NOTE — Telephone Encounter (Signed)
Sent to [facility] on [address].  ?

## 2021-08-29 NOTE — Telephone Encounter (Signed)
Called and talked with pt, also called his insurance. They need some type of approval for the medication, It takes the pharmacy 3-5 business day to approve it(ticket has been sent). called pt he is aware of this  ?

## 2021-09-05 ENCOUNTER — Other Ambulatory Visit: Payer: Self-pay | Admitting: Nurse Practitioner

## 2021-09-05 ENCOUNTER — Telehealth: Payer: Self-pay

## 2021-09-05 ENCOUNTER — Other Ambulatory Visit: Payer: Self-pay

## 2021-09-05 DIAGNOSIS — J449 Chronic obstructive pulmonary disease, unspecified: Secondary | ICD-10-CM

## 2021-09-05 MED ORDER — ALBUTEROL SULFATE HFA 108 (90 BASE) MCG/ACT IN AERS
INHALATION_SPRAY | RESPIRATORY_TRACT | 2 refills | Status: DC
  Filled 2021-09-05: qty 18, 16d supply, fill #0
  Filled 2021-11-24: qty 18, 16d supply, fill #1
  Filled 2022-01-15: qty 18, 34d supply, fill #2

## 2021-09-05 NOTE — Telephone Encounter (Signed)
I talked with Lurena Joiner about pt getting his nebulizer solution, per luke it is on back order even if approved by insurance . He suggest that you increase his albuterol inhaler or his [name] Aerosphere until its off back order ? ?

## 2021-09-05 NOTE — Telephone Encounter (Signed)
Noted pt is aware 

## 2021-09-05 NOTE — Telephone Encounter (Signed)
Opened in error

## 2021-09-05 NOTE — Telephone Encounter (Signed)
I have refilled his albuterol inhaler. [name] is already [employer] out. He can pick up his inhaler from the pharmacy. Thanks! ?

## 2021-09-06 ENCOUNTER — Other Ambulatory Visit: Payer: Self-pay

## 2021-09-06 DIAGNOSIS — H4089 Other specified glaucoma: Secondary | ICD-10-CM | POA: Diagnosis not present

## 2021-09-06 DIAGNOSIS — Z961 Presence of intraocular lens: Secondary | ICD-10-CM | POA: Diagnosis not present

## 2021-09-06 DIAGNOSIS — T868411 Corneal transplant failure, right eye: Secondary | ICD-10-CM | POA: Diagnosis not present

## 2021-09-06 DIAGNOSIS — H16231 Neurotrophic keratoconjunctivitis, right eye: Secondary | ICD-10-CM | POA: Diagnosis not present

## 2021-09-06 DIAGNOSIS — H40022 Open angle with borderline findings, high risk, left eye: Secondary | ICD-10-CM | POA: Diagnosis not present

## 2021-09-06 DIAGNOSIS — H40219 Acute angle-closure glaucoma, unspecified eye: Secondary | ICD-10-CM | POA: Diagnosis not present

## 2021-09-06 MED ORDER — TIMOLOL MALEATE 0.5 % OP SOLN
OPHTHALMIC | 11 refills | Status: DC
  Filled 2021-11-24: qty 10, 37d supply, fill #0
  Filled 2021-12-11: qty 10, 80d supply, fill #1
  Filled 2022-01-11: qty 10, 50d supply, fill #1

## 2021-09-06 MED ORDER — ATROPINE SULFATE 1 % OP SOLN
OPHTHALMIC | 3 refills | Status: DC
  Filled 2021-11-24: qty 5, 75d supply, fill #0
  Filled 2021-12-01: qty 5, 80d supply, fill #0

## 2021-09-06 MED ORDER — LATANOPROST 0.005 % OP SOLN
OPHTHALMIC | 4 refills | Status: DC
  Filled 2021-09-22: qty 7.5, 60d supply, fill #0
  Filled 2022-01-15: qty 7.5, 60d supply, fill #1

## 2021-09-06 MED ORDER — DORZOLAMIDE HCL 2 % OP SOLN
OPHTHALMIC | 6 refills | Status: DC
  Filled 2021-09-06 – 2021-12-11 (×2): qty 10, 80d supply, fill #0
  Filled 2022-01-11: qty 10, 50d supply, fill #0

## 2021-09-06 MED ORDER — ERYTHROMYCIN 5 MG/GM OP OINT
TOPICAL_OINTMENT | OPHTHALMIC | 11 refills | Status: DC
  Filled 2021-09-06: qty 3.5, 7d supply, fill #0
  Filled 2022-01-12: qty 3.5, 28d supply, fill #0
  Filled 2022-03-05: qty 3.5, 28d supply, fill #1

## 2021-09-12 ENCOUNTER — Encounter: Payer: Self-pay | Admitting: Nurse Practitioner

## 2021-09-12 ENCOUNTER — Ambulatory Visit (HOSPITAL_BASED_OUTPATIENT_CLINIC_OR_DEPARTMENT_OTHER): Payer: Medicare Other | Admitting: Nurse Practitioner

## 2021-09-12 ENCOUNTER — Other Ambulatory Visit: Payer: Self-pay

## 2021-09-12 DIAGNOSIS — R3914 Feeling of incomplete bladder emptying: Secondary | ICD-10-CM

## 2021-09-12 DIAGNOSIS — N401 Enlarged prostate with lower urinary tract symptoms: Secondary | ICD-10-CM

## 2021-09-12 DIAGNOSIS — J449 Chronic obstructive pulmonary disease, unspecified: Secondary | ICD-10-CM

## 2021-09-12 MED ORDER — ALBUTEROL SULFATE (2.5 MG/3ML) 0.083% IN NEBU
2.5000 mg | INHALATION_SOLUTION | Freq: Four times a day (QID) | RESPIRATORY_TRACT | 3 refills | Status: DC | PRN
  Filled 2021-09-12 – 2022-01-11 (×2): qty 150, 13d supply, fill #0
  Filled 2022-07-06: qty 180, 15d supply, fill #1

## 2021-09-12 NOTE — Progress Notes (Signed)
Virtual Visit via Telephone Note ? I discussed the limitations, risks, security and privacy concerns of performing an evaluation and management service by telephone and the availability of in person appointments. I also discussed with the patient that there may be a patient responsible charge related to this service. The patient expressed understanding and agreed to proceed.  ? ? ?I connected with Bryan Sullivan on 09/12/21  at   9:30 AM EDT  EDT by telephone and verified that I am speaking with the correct person using two identifiers. ? ?Location of Patient: ?Private Residence ?  ?Location of Provider: ?Scientist, research (physical sciences) and [employer] Office  ?  ?Persons participating in Telemedicine visit: ?Geryl Rankins FNP-BC ?Jahmeek Timm  ?SON [name] is interpreting today  ?  ?History of Present Illness: ?Telemedicine visit for: ASTHMA and requesting referral for second opinion with Urology ? ?BPH ?He has a history of BPH with associated LUTS (dysuria, incomplete bladder emptying, right testicular pain).  His symptoms are persistent despite taking flomax/proscar. Was instructed by [facility] that he would need surgical intervention however he was not satisfied with that recommendation and would like a second opinion. ?Lab Results  ?Component Value Date  ? PSA1 4.2 (H) 11/18/2018  ? PSA1 4.3 (H) 07/11/2017  ? PSA 3.92 11/24/2014  ? ?   ? ?Asthma ?States he was told by the pharmacy that his insurance will not cover albuterol nebs. I have notified our clinic pharmacy and awaiting additional information regarding this as he was able to obtain his nebulizers just a few months ago.  ? ?Glaucoma ?Son is requesting TIMOPTIC and not generic Timolol. I have instructed him that the eye specialist will need to order this as he is the current and initial prescriber.  ? ? ? ?Past Medical History:  ?Diagnosis Date  ? Asthma   ? As child   ? BPH (benign prostatic hyperplasia)   ? COVID-19 09/19/2018  ? COVID-19 virus  infection 09/18/2018  ? Enlarged prostate Dx 2001  ? Hypertension   ? Dx at age 4  ? Lumbar radiculopathy   ? Open angle with borderline findings and high glaucoma risk in left eye   ? Primary open angle glaucoma of right eye, severe stage   ?  ?Past Surgical History:  ?Procedure Laterality Date  ? CATARACT EXTRACTION W/ INTRAOCULAR LENS  IMPLANT, BILATERAL Bilateral   ? CORNEAL TRANSPLANT Right   ? EYE SURGERY    ?  ?Family History  ?Problem Relation Age of Onset  ? Diabetes Mother   ? Asthma Father   ? Hypertension Father   ? Asthma Sister   ?  ?Social History  ? ?Socioeconomic History  ? Marital status: Married  ?  Spouse name: Not on file  ? Number of children: Not on file  ? Years of education: Not on file  ? Highest education level: Not on file  ?Occupational History  ? Not on file  ?Tobacco Use  ? Smoking status: Former  ?  Types: Cigarettes  ?  Quit date: 09/21/1968  ?  Years since quitting: 53.0  ? Smokeless tobacco: Never  ? Tobacco comments:  ?  "quit smoking cigarettes in 1970"  ?Vaping Use  ? Vaping Use: Never used  ?Substance and Sexual Activity  ? Alcohol use: No  ? Drug use: No  ? Sexual activity: Not on file  ?Other Topics Concern  ? Not on file  ?Social History Narrative  ? From [place]  ? Moved to [place] in 2014   ? ?  Social Determinants of Health  ? ?Financial Resource Strain: Not on file  ?Food Insecurity: Not on file  ?Transportation Needs: Not on file  ?Physical Activity: Not on file  ?Stress: Not on file  ?Social Connections: Not on file  ?  ? ?Observations/Objective: ?Awake, alert and oriented x 3 ? ? ?Review of Systems  ?Constitutional:  Negative for fever, malaise/fatigue and weight loss.  ?HENT: Negative.  Negative for nosebleeds.   ?Eyes:  Positive for blurred vision. Negative for double vision and photophobia.  ?Respiratory: Negative.  Negative for cough and shortness of breath.   ?Cardiovascular: Negative.  Negative for chest pain, palpitations and leg swelling.  ?Gastrointestinal: Negative.   Negative for heartburn, nausea and vomiting.  ?Genitourinary:  Positive for dysuria, frequency and urgency. Negative for flank pain and hematuria.  ?Musculoskeletal: Negative.  Negative for myalgias.  ?Neurological: Negative.  Negative for dizziness, focal weakness, seizures and headaches.  ?Psychiatric/Behavioral: Negative.  Negative for suicidal ideas.    ?Assessment and Plan: ?Diagnoses and all orders for this visit: ? ?Benign prostatic hyperplasia with incomplete bladder emptying ?-     Ambulatory referral to Urology ? ?Chronic obstructive airway disease with asthma (HCC) ?-     albuterol (PROVENTIL) (2.5 MG/3ML) 0.083% nebulizer solution; Take 3 mLs (2.5 mg total) by nebulization every 6 (six) hours as needed for wheezing or shortness of breath. ? ?  ? ?Follow Up Instructions ?Return if symptoms worsen or fail to improve.  ? ?  ?I discussed the assessment and treatment plan with the patient. The patient was provided an opportunity to ask questions and all were answered. The patient agreed with the plan and demonstrated an understanding of the instructions. ?  ?The patient was advised to call back or seek an in-person evaluation if the symptoms worsen or if the condition fails to improve as anticipated. ? ?I provided 12 minutes of non-face-to-face time during this encounter including median intraservice time, reviewing previous notes, labs, imaging, medications and explaining diagnosis and management. ? ?Gildardo Pounds, FNP-BC  ?

## 2021-09-13 ENCOUNTER — Other Ambulatory Visit: Payer: Self-pay

## 2021-09-14 ENCOUNTER — Other Ambulatory Visit: Payer: Self-pay

## 2021-09-15 ENCOUNTER — Other Ambulatory Visit: Payer: Self-pay

## 2021-09-22 ENCOUNTER — Other Ambulatory Visit: Payer: Self-pay

## 2021-09-22 ENCOUNTER — Other Ambulatory Visit: Payer: Self-pay | Admitting: Nurse Practitioner

## 2021-09-22 ENCOUNTER — Other Ambulatory Visit: Payer: Self-pay | Admitting: *Deleted

## 2021-09-22 ENCOUNTER — Other Ambulatory Visit: Payer: Self-pay | Admitting: Family Medicine

## 2021-09-22 ENCOUNTER — Telehealth: Payer: Self-pay | Admitting: Nurse Practitioner

## 2021-09-22 DIAGNOSIS — I10 Essential (primary) hypertension: Secondary | ICD-10-CM

## 2021-09-22 DIAGNOSIS — N401 Enlarged prostate with lower urinary tract symptoms: Secondary | ICD-10-CM

## 2021-09-22 MED ORDER — FINASTERIDE 5 MG PO TABS
5.0000 mg | ORAL_TABLET | Freq: Every day | ORAL | 0 refills | Status: DC
  Filled 2021-09-22: qty 90, 90d supply, fill #0

## 2021-09-22 MED ORDER — TAMSULOSIN HCL 0.4 MG PO CAPS
0.8000 mg | ORAL_CAPSULE | Freq: Every day | ORAL | 0 refills | Status: DC
  Filled 2021-09-22: qty 180, 90d supply, fill #0

## 2021-09-22 MED ORDER — AMLODIPINE BESYLATE 2.5 MG PO TABS
ORAL_TABLET | ORAL | 1 refills | Status: DC
  Filled 2021-09-22 – 2022-01-11 (×2): qty 180, 90d supply, fill #0

## 2021-09-22 MED ORDER — AMLODIPINE BESYLATE 2.5 MG PO TABS
ORAL_TABLET | ORAL | 6 refills | Status: DC
  Filled 2021-09-22 – 2021-09-25 (×5): qty 60, 30d supply, fill #0
  Filled 2021-11-24: qty 60, 30d supply, fill #1

## 2021-09-22 NOTE — Telephone Encounter (Signed)
Medication was refilled today.

## 2021-09-22 NOTE — Telephone Encounter (Signed)
Pt son came in to request to refill his fathers Finasteride. ?

## 2021-09-25 ENCOUNTER — Other Ambulatory Visit: Payer: Self-pay

## 2021-09-26 ENCOUNTER — Other Ambulatory Visit: Payer: Self-pay

## 2021-10-02 ENCOUNTER — Other Ambulatory Visit: Payer: Self-pay

## 2021-10-03 ENCOUNTER — Other Ambulatory Visit: Payer: Self-pay

## 2021-11-24 ENCOUNTER — Other Ambulatory Visit: Payer: Self-pay

## 2021-11-30 ENCOUNTER — Other Ambulatory Visit: Payer: Self-pay

## 2021-12-01 ENCOUNTER — Other Ambulatory Visit: Payer: Self-pay

## 2021-12-05 ENCOUNTER — Other Ambulatory Visit: Payer: Self-pay

## 2021-12-05 ENCOUNTER — Other Ambulatory Visit: Payer: Self-pay | Admitting: Family Medicine

## 2021-12-05 DIAGNOSIS — N401 Enlarged prostate with lower urinary tract symptoms: Secondary | ICD-10-CM

## 2021-12-06 NOTE — Telephone Encounter (Signed)
Alinda Sierras can you find out what happened to his urology referral before I send more medication? Thanks!!

## 2021-12-08 ENCOUNTER — Other Ambulatory Visit: Payer: Self-pay | Admitting: Nurse Practitioner

## 2021-12-08 ENCOUNTER — Other Ambulatory Visit: Payer: Self-pay

## 2021-12-08 DIAGNOSIS — N401 Enlarged prostate with lower urinary tract symptoms: Secondary | ICD-10-CM

## 2021-12-08 MED ORDER — FINASTERIDE 5 MG PO TABS
5.0000 mg | ORAL_TABLET | Freq: Every day | ORAL | 0 refills | Status: DC
  Filled 2021-12-08 – 2021-12-11 (×2): qty 30, 30d supply, fill #0

## 2021-12-08 MED ORDER — TAMSULOSIN HCL 0.4 MG PO CAPS
0.8000 mg | ORAL_CAPSULE | Freq: Every day | ORAL | 0 refills | Status: DC
  Filled 2021-12-08 – 2021-12-11 (×2): qty 60, 30d supply, fill #0

## 2021-12-08 NOTE — Telephone Encounter (Signed)
Please let patient know I did courtesy refill of urology meds. He has to call the number below in the previous message to get scheduled with urology. They need to see him and should be filling these medications for him. This will be his last refill of these medications through PCP

## 2021-12-08 NOTE — Telephone Encounter (Signed)
Advised son that rx was sent to pharmacy and this rx would need to be refilled with Urology office at [facility]   He states he could not take down number. Advised that I could not text it.   He states he will call office back to get number.

## 2021-12-11 ENCOUNTER — Other Ambulatory Visit: Payer: Self-pay

## 2021-12-12 ENCOUNTER — Other Ambulatory Visit: Payer: Self-pay

## 2021-12-21 ENCOUNTER — Other Ambulatory Visit: Payer: Self-pay

## 2021-12-21 ENCOUNTER — Telehealth: Payer: Self-pay | Admitting: Emergency Medicine

## 2021-12-21 DIAGNOSIS — H40023 Open angle with borderline findings, high risk, bilateral: Secondary | ICD-10-CM | POA: Diagnosis not present

## 2021-12-21 DIAGNOSIS — H04123 Dry eye syndrome of bilateral lacrimal glands: Secondary | ICD-10-CM | POA: Diagnosis not present

## 2021-12-21 DIAGNOSIS — Z961 Presence of intraocular lens: Secondary | ICD-10-CM | POA: Diagnosis not present

## 2021-12-22 NOTE — Telephone Encounter (Signed)
Encounter created in error

## 2022-01-11 ENCOUNTER — Other Ambulatory Visit: Payer: Self-pay | Admitting: Nurse Practitioner

## 2022-01-11 ENCOUNTER — Other Ambulatory Visit: Payer: Self-pay

## 2022-01-11 DIAGNOSIS — N401 Enlarged prostate with lower urinary tract symptoms: Secondary | ICD-10-CM

## 2022-01-11 MED ORDER — TAMSULOSIN HCL 0.4 MG PO CAPS
0.8000 mg | ORAL_CAPSULE | Freq: Every day | ORAL | 0 refills | Status: DC
  Filled 2022-01-11: qty 60, 30d supply, fill #0

## 2022-01-11 MED ORDER — FINASTERIDE 5 MG PO TABS
5.0000 mg | ORAL_TABLET | Freq: Every day | ORAL | 0 refills | Status: DC
  Filled 2022-01-11: qty 30, 30d supply, fill #0

## 2022-01-12 ENCOUNTER — Other Ambulatory Visit: Payer: Self-pay

## 2022-01-15 ENCOUNTER — Other Ambulatory Visit: Payer: Self-pay

## 2022-01-23 DIAGNOSIS — R351 Nocturia: Secondary | ICD-10-CM | POA: Diagnosis not present

## 2022-01-23 DIAGNOSIS — N401 Enlarged prostate with lower urinary tract symptoms: Secondary | ICD-10-CM | POA: Diagnosis not present

## 2022-01-23 DIAGNOSIS — Z8719 Personal history of other diseases of the digestive system: Secondary | ICD-10-CM | POA: Diagnosis not present

## 2022-02-02 ENCOUNTER — Other Ambulatory Visit: Payer: Self-pay

## 2022-02-02 DIAGNOSIS — R3912 Poor urinary stream: Secondary | ICD-10-CM | POA: Diagnosis not present

## 2022-02-02 DIAGNOSIS — R972 Elevated prostate specific antigen [PSA]: Secondary | ICD-10-CM | POA: Diagnosis not present

## 2022-02-02 DIAGNOSIS — N403 Nodular prostate with lower urinary tract symptoms: Secondary | ICD-10-CM | POA: Diagnosis not present

## 2022-02-02 DIAGNOSIS — R350 Frequency of micturition: Secondary | ICD-10-CM | POA: Diagnosis not present

## 2022-02-02 MED ORDER — LEVOFLOXACIN 750 MG PO TABS
ORAL_TABLET | ORAL | 0 refills | Status: DC
  Filled 2022-02-02: qty 1, 1d supply, fill #0

## 2022-02-02 MED ORDER — SILODOSIN 8 MG PO CAPS
ORAL_CAPSULE | ORAL | 3 refills | Status: DC
  Filled 2022-02-02: qty 30, 30d supply, fill #0

## 2022-02-07 DIAGNOSIS — Z1331 Encounter for screening for depression: Secondary | ICD-10-CM | POA: Diagnosis not present

## 2022-02-07 DIAGNOSIS — R0602 Shortness of breath: Secondary | ICD-10-CM | POA: Diagnosis not present

## 2022-02-07 DIAGNOSIS — R5383 Other fatigue: Secondary | ICD-10-CM | POA: Diagnosis not present

## 2022-02-07 DIAGNOSIS — Z87438 Personal history of other diseases of male genital organs: Secondary | ICD-10-CM | POA: Diagnosis not present

## 2022-02-07 DIAGNOSIS — Z6826 Body mass index (BMI) 26.0-26.9, adult: Secondary | ICD-10-CM | POA: Diagnosis not present

## 2022-02-07 DIAGNOSIS — E559 Vitamin D deficiency, unspecified: Secondary | ICD-10-CM | POA: Diagnosis not present

## 2022-02-07 DIAGNOSIS — Z0000 Encounter for general adult medical examination without abnormal findings: Secondary | ICD-10-CM | POA: Diagnosis not present

## 2022-02-07 DIAGNOSIS — Z1339 Encounter for screening examination for other mental health and behavioral disorders: Secondary | ICD-10-CM | POA: Diagnosis not present

## 2022-02-07 DIAGNOSIS — Z125 Encounter for screening for malignant neoplasm of prostate: Secondary | ICD-10-CM | POA: Diagnosis not present

## 2022-02-07 DIAGNOSIS — Z1159 Encounter for screening for other viral diseases: Secondary | ICD-10-CM | POA: Diagnosis not present

## 2022-02-07 DIAGNOSIS — Z20822 Contact with and (suspected) exposure to covid-19: Secondary | ICD-10-CM | POA: Diagnosis not present

## 2022-02-12 DIAGNOSIS — R0789 Other chest pain: Secondary | ICD-10-CM | POA: Diagnosis not present

## 2022-02-12 DIAGNOSIS — I10 Essential (primary) hypertension: Secondary | ICD-10-CM | POA: Diagnosis not present

## 2022-02-12 DIAGNOSIS — I2510 Atherosclerotic heart disease of native coronary artery without angina pectoris: Secondary | ICD-10-CM | POA: Diagnosis not present

## 2022-02-16 ENCOUNTER — Emergency Department (HOSPITAL_COMMUNITY): Payer: Medicare Other

## 2022-02-16 ENCOUNTER — Encounter (HOSPITAL_COMMUNITY): Payer: Self-pay

## 2022-02-16 ENCOUNTER — Emergency Department (HOSPITAL_COMMUNITY)
Admission: EM | Admit: 2022-02-16 | Discharge: 2022-02-17 | Disposition: A | Discharge status: Home / Self Care | Payer: Medicare Other | Attending: Emergency Medicine | Admitting: Emergency Medicine

## 2022-02-16 DIAGNOSIS — J449 Chronic obstructive pulmonary disease, unspecified: Secondary | ICD-10-CM | POA: Diagnosis not present

## 2022-02-16 DIAGNOSIS — R079 Chest pain, unspecified: Secondary | ICD-10-CM | POA: Insufficient documentation

## 2022-02-16 DIAGNOSIS — R0789 Other chest pain: Secondary | ICD-10-CM | POA: Diagnosis not present

## 2022-02-16 DIAGNOSIS — J439 Emphysema, unspecified: Secondary | ICD-10-CM | POA: Diagnosis not present

## 2022-02-16 LAB — BASIC METABOLIC PANEL
Anion gap: 7 (ref 5–15)
BUN: 14 mg/dL (ref 8–23)
CO2: 25 mmol/L (ref 22–32)
Calcium: 8.6 mg/dL — ABNORMAL LOW (ref 8.9–10.3)
Chloride: 106 mmol/L (ref 98–111)
Creatinine, Ser: 1.01 mg/dL (ref 0.61–1.24)
GFR, Estimated: >60 mL/min (ref >60)
Glucose, Bld: 112 mg/dL — ABNORMAL HIGH (ref 70–99)
Potassium: 3.9 mmol/L (ref 3.5–5.1)
Sodium: 138 mmol/L (ref 135–145)

## 2022-02-16 LAB — TROPONIN I (HIGH SENSITIVITY): Troponin I (High Sensitivity): 4 ng/L (ref <18)

## 2022-02-16 LAB — CBC
HCT: 43.8 % (ref 39.0–52.0)
Hemoglobin: 13.7 g/dL (ref 13.0–17.0)
MCH: 27.9 pg (ref 26.0–34.0)
MCHC: 31.3 g/dL (ref 30.0–36.0)
MCV: 89.2 fL (ref 80.0–100.0)
Platelets: 229 10*3/uL (ref 150–400)
RBC: 4.91 MIL/uL (ref 4.22–5.81)
RDW: 12.8 % (ref 11.5–15.5)
WBC: 9.9 10*3/uL (ref 4.0–10.5)
nRBC: 0 % (ref 0.0–0.2)

## 2022-02-16 MED ORDER — ASPIRIN 81 MG PO CHEW
324.0000 mg | CHEWABLE_TABLET | Freq: Once | ORAL | Status: AC
  Administered 2022-02-16: 324 mg via ORAL
  Filled 2022-02-16: qty 4

## 2022-02-16 MED ORDER — MORPHINE SULFATE (PF) 4 MG/ML IV SOLN
4.0000 mg | Freq: Once | INTRAVENOUS | Status: AC
  Administered 2022-02-16: 4 mg via INTRAVENOUS
  Filled 2022-02-16: qty 1

## 2022-02-16 MED ORDER — NITROGLYCERIN 0.4 MG SL SUBL
0.4000 mg | SUBLINGUAL_TABLET | SUBLINGUAL | Status: DC | PRN
  Administered 2022-02-16 (×2): 0.4 mg via SUBLINGUAL
  Filled 2022-02-16: qty 1

## 2022-02-16 NOTE — ED Provider Triage Note (Signed)
Emergency Medicine Provider Triage Evaluation Note  Bryan Sullivan , a 76 y.o. male  was evaluated in triage.  Pt complains of left-sided chest pain.  Patient says that the pain started yesterday.  The pain is intermittent, dull, no radiation to anywhere else.  He has not tried any medication for pain.  He has a history of hypertension, asthma, endorses shortness of breath.  Denies any dizziness, tingling or numbness, nausea, vomiting.  Review of Systems  Positive: Chest pain, shortness of breath, low back pain Negative: Fever  Physical Exam  BP (!) 160/76   Pulse 85   Temp 98.7 F (37.1 C) (Oral)   Resp 18   Ht '5\' 10"'$  (1.778 m)   Wt 81.6 kg   SpO2 100%   BMI 25.83 kg/m  Gen:   Awake, no distress   Resp:  Normal effort, no wheezes, no rales MSK:   Moves extremities without difficulty, lower back pain Other:    Medical Decision Making  Medically screening exam initiated at 10:28 PM.  Appropriate orders placed.  Bryan Sullivan was informed that the remainder of the evaluation will be completed by another provider, this initial triage assessment does not replace that evaluation, and the importance of remaining in the ED until their evaluation is complete.  Work-up initiated.   Rex Kras, [place] 02/16/22 2233

## 2022-02-16 NOTE — ED Provider Notes (Signed)
[facility] Emergency Department Provider Note MRN:  [id number]  Arrival date & time: 02/17/22     Chief Complaint   Chest Pain   History of Present Illness   Bryan Sullivan is a 76 y.o. year-old male presents to the ED with chief complaint of chest pain.  Onset was this morning.  Pain is intermittent.  He states that it is a 9/10.  He denies SOB.  Denies fever, chills, cough.  He states that he has not taken anything for his symptoms.  Was seen on 02/12/22 by cardiology and documented that pain is very atypical and he has episodes like this about once a month.  History provided by patient.   Review of Systems  Pertinent positive and negative review of systems noted in HPI.    Physical Exam   Vitals:   02/17/22 0230 02/17/22 0300  BP: (!) 119/92   Pulse: 80 82  Resp: 13 17  Temp:    SpO2: 99% 100%    CONSTITUTIONAL:  well-appearing, NAD NEURO:  Alert and oriented x 3, CN 3-12 grossly intact EYES:  eyes equal and reactive ENT/NECK:  Supple, no stridor  CARDIO:  normal rate, regular rhythm, appears well-perfused  PULM:  No respiratory distress, CTAB GI/GU:  non-distended,  MSK/SPINE:  No gross deformities, no edema, moves all extremities  SKIN:  no rash, atraumatic   *Additional and/or pertinent findings included in MDM below  Diagnostic and Interventional Summary    EKG Interpretation  Date/Time:  Friday February 16 2022 21:48:51 EDT Ventricular Rate:  90 PR Interval:  250 QRS Duration: 100 QT Interval:  358 QTC Calculation: 438 R Axis:   -55 Text Interpretation: Sinus rhythm Atrial premature complexes Prolonged PR interval Left anterior fascicular block Confirmed by Randal Buba, April ([id number]) on 02/16/2022 11:26:26 PM       Labs Reviewed  BASIC METABOLIC PANEL - Abnormal; Notable for the following components:      Result Value   Glucose, Bld 112 (*)    Calcium 8.6 (*)    All other components within normal limits  CBC  TROPONIN  I (HIGH SENSITIVITY)  TROPONIN I (HIGH SENSITIVITY)    DG Chest 2 View  Final Result      Medications  nitroGLYCERIN (NITROSTAT) SL tablet 0.4 mg (0.4 mg Sublingual Given 02/16/22 2351)  aspirin chewable tablet 324 mg (324 mg Oral Given 02/16/22 2316)  morphine (PF) 4 MG/ML injection 4 mg (4 mg Intravenous Given 02/16/22 2321)     Procedures  /  Critical Care Procedures  ED Course and Medical Decision Making  I have reviewed the triage vital signs, the nursing notes, and pertinent available records from the EMR.  Social Determinants Affecting Complexity of Care: Patient has no clinically significant social determinants affecting this chief complaint..   ED Course:    Medical Decision Making Patient here with chest pain that started this morning.  It has been intermittent.  He has history of the same.  Was seen last week by cardiology for the same type pain.  Cardiology note states that it is atypical and considers pleuritic pain versus GI etiology high on the differential.  Labs ordered, will will check chest x-ray and EKG.  Labs are reassuring.  Family member requesting discharge.  Patient states that they feel much better and are agreeable with plan for discharge.  Amount and/or Complexity of Data Reviewed Labs: ordered.    Details: Troponins are negative. Radiology: ordered and independent interpretation performed.  Details: No obvious infiltrate ECG/medicine tests: independent interpretation performed.    Details: No acute ischemic changes  Risk OTC drugs. Prescription drug management. Decision regarding hospitalization.     Consultants: No consultations were needed in caring for this patient.   Treatment and Plan: I considered admission due to patient's initial presentation, but after considering the examination and diagnostic results, patient will not require admission and can be discharged with outpatient follow-up.  Patient discussed with attending  physician, Dr. Randal Buba, who agrees with plan.  Final Clinical Impressions(s) / ED Diagnoses     ICD-10-CM   1. Nonspecific chest pain  R07.9       ED Discharge Orders     None         Discharge Instructions Discussed with and Provided to Patient:     Discharge Instructions      I will call you if the test result is abnormal.  No news is good news.   Please follow-up with your cardiologist.       Montine Circle, PA-C 02/17/22 [place], April, MD 02/17/22 [id number]

## 2022-02-16 NOTE — ED Triage Notes (Signed)
Patient arrived with complaints of left sided chest pain that started today. Declines any other symptoms at this time.

## 2022-02-17 DIAGNOSIS — R079 Chest pain, unspecified: Secondary | ICD-10-CM | POA: Diagnosis not present

## 2022-02-17 LAB — TROPONIN I (HIGH SENSITIVITY): Troponin I (High Sensitivity): 5 ng/L (ref <18)

## 2022-02-17 NOTE — ED Notes (Signed)
Light green recollected by EMT

## 2022-02-17 NOTE — Discharge Instructions (Signed)
I will call you if the test result is abnormal.  No news is good news.   Please follow-up with your cardiologist.

## 2022-02-17 NOTE — ED Provider Notes (Signed)
Patient family member demanding discharge.  Repeatedly comes out asking about laboratory results.  I notified the family several times as has nursing staff that we are still waiting on the final troponin result.  Patient's family member states "you know those tests only take 10 to 15 minutes to run, why is taking an hour?  Stop playing games."  I reassured the patient's family member that everyone is working as fast as they possibly can.  Given that patient feels better, I propose that the patient could leave now and I will notify the patient's family if the results are positive or the troponin level is abnormal.  They are agreeable with this plan.   Montine Circle, PA-C 02/17/22 [place], April, MD 02/17/22 [phone]

## 2022-02-20 ENCOUNTER — Other Ambulatory Visit: Payer: Self-pay | Admitting: Family Medicine

## 2022-02-20 DIAGNOSIS — N401 Enlarged prostate with lower urinary tract symptoms: Secondary | ICD-10-CM

## 2022-02-21 ENCOUNTER — Other Ambulatory Visit: Payer: Self-pay

## 2022-02-21 ENCOUNTER — Other Ambulatory Visit: Payer: Self-pay | Admitting: Family Medicine

## 2022-02-21 DIAGNOSIS — N401 Enlarged prostate with lower urinary tract symptoms: Secondary | ICD-10-CM

## 2022-02-21 NOTE — Telephone Encounter (Signed)
Unable to refill per protocol, appointment needed. Medication was refused 02/21/22. Will refuse duplicate request.  Requested Prescriptions  Pending Prescriptions Disp Refills  . finasteride (PROSCAR) 5 MG tablet 30 tablet 0    Sig: Take 1 tablet (5 mg total) by mouth daily.     Urology: 5-alpha Reductase Inhibitors Failed - 02/21/2022  2:38 PM      Failed - PSA in normal range and within 360 days    PSA  Date Value Ref Range Status  11/24/2014 3.92 <=4.00 ng/mL Final    Comment:    Test Methodology: ECLIA PSA (Electrochemiluminescence Immunoassay)   For PSA values from 2.5-4.0, particularly in younger men <17 years old, the AUA and NCCN suggest testing for % Free PSA (3515) and evaluation of the rate of increase in PSA (PSA velocity).    Prostate Specific Ag, Serum  Date Value Ref Range Status  11/18/2018 4.2 (H) 0.0 - 4.0 ng/mL Final    Comment:    Roche ECLIA methodology. According to the American Urological Association, Serum PSA should decrease and remain at undetectable levels after radical prostatectomy. The AUA defines biochemical recurrence as an initial PSA value 0.2 ng/mL or greater followed by a subsequent confirmatory PSA value 0.2 ng/mL or greater. Values obtained with different assay methods or kits cannot be used interchangeably. Results cannot be interpreted as absolute evidence of the presence or absence of malignant disease.          Passed - Valid encounter within last 12 months    Recent Outpatient Visits          5 months ago Benign prostatic hyperplasia with incomplete bladder emptying   [place], Vernia Buff, NP   8 months ago Essential hypertension   [place], Vernia Buff, NP   1 year ago Chronic obstructive airway disease with asthma [facility])   [facility] Elsie Stain, MD   3 years ago Chronic obstructive airway disease with asthma [facility])    [place] Elsie Stain, MD   3 years ago [place] Elsie Stain, MD

## 2022-02-21 NOTE — Telephone Encounter (Signed)
   Requested medication (s) are due for refill today: yes  Requested medication (s) are on the active medication list: yes  Last refill:  01/11/22 #30 with 0 RF  Future visit scheduled: no  Notes to clinic:  had a curtesy refill in August, pt seen in April, labs are from 2020, please assess.   Requested Prescriptions  Pending Prescriptions Disp Refills   finasteride (PROSCAR) 5 MG tablet 30 tablet 0    Sig: Take 1 tablet (5 mg total) by mouth daily.     Urology: 5-alpha Reductase Inhibitors Failed - 02/20/2022  9:09 PM      Failed - PSA in normal range and within 360 days    PSA  Date Value Ref Range Status  11/24/2014 3.92 <=4.00 ng/mL Final    Comment:    Test Methodology: ECLIA PSA (Electrochemiluminescence Immunoassay)   For PSA values from 2.5-4.0, particularly in younger men <70 years old, the AUA and NCCN suggest testing for % Free PSA (3515) and evaluation of the rate of increase in PSA (PSA velocity).    Prostate Specific Ag, Serum  Date Value Ref Range Status  11/18/2018 4.2 (H) 0.0 - 4.0 ng/mL Final    Comment:    Roche ECLIA methodology. According to the American Urological Association, Serum PSA should decrease and remain at undetectable levels after radical prostatectomy. The AUA defines biochemical recurrence as an initial PSA value 0.2 ng/mL or greater followed by a subsequent confirmatory PSA value 0.2 ng/mL or greater. Values obtained with different assay methods or kits cannot be used interchangeably. Results cannot be interpreted as absolute evidence of the presence or absence of malignant disease.          Passed - Valid encounter within last 12 months    Recent Outpatient Visits           5 months ago Benign prostatic hyperplasia with incomplete bladder emptying   [place], Vernia Buff, NP   8 months ago Essential hypertension   [place], Vernia Buff, NP   1 year ago  Chronic obstructive airway disease with asthma [facility])   [facility] Elsie Stain, MD   3 years ago Chronic obstructive airway disease with asthma [facility])   [place] Elsie Stain, MD   3 years ago [place] Elsie Stain, MD

## 2022-02-22 ENCOUNTER — Other Ambulatory Visit: Payer: Self-pay

## 2022-02-22 DIAGNOSIS — R9431 Abnormal electrocardiogram [ECG] [EKG]: Secondary | ICD-10-CM | POA: Diagnosis not present

## 2022-02-22 DIAGNOSIS — I491 Atrial premature depolarization: Secondary | ICD-10-CM | POA: Diagnosis not present

## 2022-02-22 DIAGNOSIS — I10 Essential (primary) hypertension: Secondary | ICD-10-CM | POA: Diagnosis not present

## 2022-02-22 DIAGNOSIS — I2510 Atherosclerotic heart disease of native coronary artery without angina pectoris: Secondary | ICD-10-CM | POA: Diagnosis not present

## 2022-02-22 DIAGNOSIS — R0602 Shortness of breath: Secondary | ICD-10-CM | POA: Diagnosis not present

## 2022-02-22 DIAGNOSIS — R0789 Other chest pain: Secondary | ICD-10-CM | POA: Diagnosis not present

## 2022-02-22 MED ORDER — FINASTERIDE 5 MG PO TABS
5.0000 mg | ORAL_TABLET | Freq: Every day | ORAL | 0 refills | Status: DC
  Filled 2022-02-22: qty 30, 30d supply, fill #0

## 2022-02-22 MED ORDER — RANOLAZINE ER 500 MG PO TB12
500.0000 mg | ORAL_TABLET | Freq: Two times a day (BID) | ORAL | 2 refills | Status: DC
  Filled 2022-02-22: qty 60, 30d supply, fill #0
  Filled 2022-04-11: qty 60, 30d supply, fill #1
  Filled 2022-05-13: qty 60, 30d supply, fill #2

## 2022-02-23 ENCOUNTER — Other Ambulatory Visit: Payer: Self-pay

## 2022-02-23 DIAGNOSIS — Z87438 Personal history of other diseases of male genital organs: Secondary | ICD-10-CM | POA: Diagnosis not present

## 2022-02-23 DIAGNOSIS — E789 Disorder of lipoprotein metabolism, unspecified: Secondary | ICD-10-CM | POA: Diagnosis not present

## 2022-02-23 DIAGNOSIS — I2510 Atherosclerotic heart disease of native coronary artery without angina pectoris: Secondary | ICD-10-CM | POA: Diagnosis not present

## 2022-02-23 DIAGNOSIS — I10 Essential (primary) hypertension: Secondary | ICD-10-CM | POA: Diagnosis not present

## 2022-02-23 DIAGNOSIS — K219 Gastro-esophageal reflux disease without esophagitis: Secondary | ICD-10-CM | POA: Diagnosis not present

## 2022-02-23 DIAGNOSIS — R7303 Prediabetes: Secondary | ICD-10-CM | POA: Diagnosis not present

## 2022-02-23 DIAGNOSIS — R972 Elevated prostate specific antigen [PSA]: Secondary | ICD-10-CM | POA: Diagnosis not present

## 2022-02-23 DIAGNOSIS — E559 Vitamin D deficiency, unspecified: Secondary | ICD-10-CM | POA: Diagnosis not present

## 2022-02-23 MED ORDER — ERGOCALCIFEROL 1.25 MG (50000 UT) PO CAPS
50000.0000 [IU] | ORAL_CAPSULE | ORAL | 1 refills | Status: DC
  Filled 2022-02-23: qty 13, 91d supply, fill #0

## 2022-02-23 MED ORDER — VALSARTAN 80 MG PO TABS
80.0000 mg | ORAL_TABLET | Freq: Every day | ORAL | 1 refills | Status: DC
  Filled 2022-02-23: qty 90, 90d supply, fill #0

## 2022-02-23 MED ORDER — ATORVASTATIN CALCIUM 40 MG PO TABS
40.0000 mg | ORAL_TABLET | Freq: Every day | ORAL | 1 refills | Status: DC
  Filled 2022-02-23 – 2022-03-16 (×2): qty 90, 90d supply, fill #0

## 2022-02-23 MED ORDER — AMLODIPINE BESYLATE 2.5 MG PO TABS
2.5000 mg | ORAL_TABLET | Freq: Two times a day (BID) | ORAL | 1 refills | Status: DC
  Filled 2022-02-23 – 2022-04-11 (×2): qty 60, 30d supply, fill #0

## 2022-02-23 MED ORDER — FAMOTIDINE 20 MG PO TABS
20.0000 mg | ORAL_TABLET | Freq: Every day | ORAL | 1 refills | Status: DC
  Filled 2022-02-23 (×2): qty 90, 90d supply, fill #0

## 2022-02-25 DIAGNOSIS — C61 Malignant neoplasm of prostate: Secondary | ICD-10-CM

## 2022-02-25 HISTORY — DX: Malignant neoplasm of prostate: C61

## 2022-02-28 DIAGNOSIS — R972 Elevated prostate specific antigen [PSA]: Secondary | ICD-10-CM | POA: Diagnosis not present

## 2022-02-28 DIAGNOSIS — D075 Carcinoma in situ of prostate: Secondary | ICD-10-CM | POA: Diagnosis not present

## 2022-02-28 DIAGNOSIS — C61 Malignant neoplasm of prostate: Secondary | ICD-10-CM | POA: Diagnosis not present

## 2022-03-01 ENCOUNTER — Other Ambulatory Visit: Payer: Self-pay

## 2022-03-06 ENCOUNTER — Other Ambulatory Visit: Payer: Self-pay

## 2022-03-07 ENCOUNTER — Other Ambulatory Visit: Payer: Self-pay

## 2022-03-08 ENCOUNTER — Other Ambulatory Visit: Payer: Self-pay

## 2022-03-12 DIAGNOSIS — C61 Malignant neoplasm of prostate: Secondary | ICD-10-CM | POA: Diagnosis not present

## 2022-03-14 ENCOUNTER — Other Ambulatory Visit: Payer: Self-pay

## 2022-03-14 ENCOUNTER — Telehealth: Payer: Self-pay | Admitting: Radiation Oncology

## 2022-03-14 ENCOUNTER — Other Ambulatory Visit (HOSPITAL_COMMUNITY): Payer: Self-pay | Admitting: Urology

## 2022-03-14 DIAGNOSIS — Z961 Presence of intraocular lens: Secondary | ICD-10-CM | POA: Diagnosis not present

## 2022-03-14 DIAGNOSIS — H16231 Neurotrophic keratoconjunctivitis, right eye: Secondary | ICD-10-CM | POA: Diagnosis not present

## 2022-03-14 DIAGNOSIS — C61 Malignant neoplasm of prostate: Secondary | ICD-10-CM

## 2022-03-14 DIAGNOSIS — H40219 Acute angle-closure glaucoma, unspecified eye: Secondary | ICD-10-CM | POA: Diagnosis not present

## 2022-03-14 DIAGNOSIS — Z947 Corneal transplant status: Secondary | ICD-10-CM | POA: Diagnosis not present

## 2022-03-14 DIAGNOSIS — T868411 Corneal transplant failure, right eye: Secondary | ICD-10-CM | POA: Diagnosis not present

## 2022-03-14 DIAGNOSIS — H4089 Other specified glaucoma: Secondary | ICD-10-CM | POA: Diagnosis not present

## 2022-03-14 DIAGNOSIS — H40022 Open angle with borderline findings, high risk, left eye: Secondary | ICD-10-CM | POA: Diagnosis not present

## 2022-03-14 MED ORDER — ATROPINE SULFATE 1 % OP SOLN
1.0000 [drp] | Freq: Every day | OPHTHALMIC | 6 refills | Status: DC | PRN
  Filled 2022-03-14: qty 4, 80d supply, fill #0

## 2022-03-14 MED ORDER — ERYTHROMYCIN 5 MG/GM OP OINT
TOPICAL_OINTMENT | OPHTHALMIC | 11 refills | Status: DC
  Filled 2022-03-14: qty 3.5, 35d supply, fill #0
  Filled 2022-04-11: qty 3.5, 20d supply, fill #0

## 2022-03-14 MED ORDER — TIMOLOL MALEATE 0.5 % OP SOLN
OPHTHALMIC | 11 refills | Status: DC
  Filled 2022-03-14: qty 10, 50d supply, fill #0

## 2022-03-14 MED ORDER — LATANOPROST 0.005 % OP SOLN
OPHTHALMIC | 6 refills | Status: DC
  Filled 2022-03-14: qty 7.5, 75d supply, fill #0

## 2022-03-14 MED ORDER — DORZOLAMIDE HCL 2 % OP SOLN
OPHTHALMIC | 6 refills | Status: DC
  Filled 2022-03-14: qty 10, 66d supply, fill #0
  Filled 2022-05-29: qty 10, 66d supply, fill #1
  Filled 2022-07-23: qty 10, 66d supply, fill #2
  Filled 2022-10-30 – 2022-11-05 (×3): qty 10, 66d supply, fill #3

## 2022-03-14 NOTE — Telephone Encounter (Signed)
Called patient to schedule a consultation w. Dr. Manning. No answer, LVM for a return call.  ?

## 2022-03-15 ENCOUNTER — Other Ambulatory Visit: Payer: Self-pay

## 2022-03-15 ENCOUNTER — Telehealth: Payer: Self-pay | Admitting: Radiation Oncology

## 2022-03-15 DIAGNOSIS — Z87438 Personal history of other diseases of male genital organs: Secondary | ICD-10-CM | POA: Diagnosis not present

## 2022-03-15 DIAGNOSIS — R7303 Prediabetes: Secondary | ICD-10-CM | POA: Diagnosis not present

## 2022-03-15 DIAGNOSIS — I2510 Atherosclerotic heart disease of native coronary artery without angina pectoris: Secondary | ICD-10-CM | POA: Diagnosis not present

## 2022-03-15 DIAGNOSIS — K5900 Constipation, unspecified: Secondary | ICD-10-CM | POA: Diagnosis not present

## 2022-03-15 DIAGNOSIS — E559 Vitamin D deficiency, unspecified: Secondary | ICD-10-CM | POA: Diagnosis not present

## 2022-03-15 DIAGNOSIS — C61 Malignant neoplasm of prostate: Secondary | ICD-10-CM | POA: Diagnosis not present

## 2022-03-15 DIAGNOSIS — R7309 Other abnormal glucose: Secondary | ICD-10-CM | POA: Diagnosis not present

## 2022-03-15 DIAGNOSIS — I10 Essential (primary) hypertension: Secondary | ICD-10-CM | POA: Diagnosis not present

## 2022-03-15 DIAGNOSIS — K219 Gastro-esophageal reflux disease without esophagitis: Secondary | ICD-10-CM | POA: Diagnosis not present

## 2022-03-15 DIAGNOSIS — E789 Disorder of lipoprotein metabolism, unspecified: Secondary | ICD-10-CM | POA: Diagnosis not present

## 2022-03-15 NOTE — Telephone Encounter (Signed)
Called patient to schedule a consultation w. Dr. Manning. No answer, LVM for a return call.  ?

## 2022-03-16 ENCOUNTER — Other Ambulatory Visit: Payer: Self-pay

## 2022-03-16 DIAGNOSIS — I10 Essential (primary) hypertension: Secondary | ICD-10-CM | POA: Diagnosis not present

## 2022-03-16 DIAGNOSIS — R0602 Shortness of breath: Secondary | ICD-10-CM | POA: Diagnosis not present

## 2022-03-16 DIAGNOSIS — I491 Atrial premature depolarization: Secondary | ICD-10-CM | POA: Diagnosis not present

## 2022-03-16 DIAGNOSIS — R9431 Abnormal electrocardiogram [ECG] [EKG]: Secondary | ICD-10-CM | POA: Diagnosis not present

## 2022-03-16 DIAGNOSIS — I2510 Atherosclerotic heart disease of native coronary artery without angina pectoris: Secondary | ICD-10-CM | POA: Diagnosis not present

## 2022-03-16 DIAGNOSIS — R0789 Other chest pain: Secondary | ICD-10-CM | POA: Diagnosis not present

## 2022-03-19 ENCOUNTER — Other Ambulatory Visit: Payer: Self-pay

## 2022-03-19 NOTE — Progress Notes (Signed)
GU Location of Tumor / Histology: Prostate Ca  If Prostate Cancer, Gleason Score is (4 + 4) and PSA is (5.15 on 01/31/2022)  Biopsies:     Past/Anticipated interventions by urology, if any: NA  Past/Anticipated interventions by medical oncology, if any: NA  Weight changes, if any:  No  IPSS: 13 SHIM:  1  Bowel/Bladder complaints, if any:  Dysuria and Constipation  Nausea/Vomiting, if any: No  Pain issues, if any:  0/10  SAFETY ISSUES: Prior radiation?  No Pacemaker/ICD? No Possible current pregnancy? Male Is the patient on methotrexate? No  Current Complaints / other details:  Needs more information on treatment options.

## 2022-03-20 DIAGNOSIS — C61 Malignant neoplasm of prostate: Secondary | ICD-10-CM | POA: Diagnosis not present

## 2022-03-22 DIAGNOSIS — I2510 Atherosclerotic heart disease of native coronary artery without angina pectoris: Secondary | ICD-10-CM | POA: Diagnosis not present

## 2022-03-22 DIAGNOSIS — I10 Essential (primary) hypertension: Secondary | ICD-10-CM | POA: Diagnosis not present

## 2022-03-22 DIAGNOSIS — R0602 Shortness of breath: Secondary | ICD-10-CM | POA: Diagnosis not present

## 2022-03-22 DIAGNOSIS — R0789 Other chest pain: Secondary | ICD-10-CM | POA: Diagnosis not present

## 2022-03-23 ENCOUNTER — Encounter (HOSPITAL_COMMUNITY)
Admission: RE | Admit: 2022-03-23 | Discharge: 2022-03-23 | Disposition: A | Discharge status: Home / Self Care | Payer: Medicare Other | Source: Ambulatory Visit | Attending: Urology | Admitting: Urology

## 2022-03-23 DIAGNOSIS — C61 Malignant neoplasm of prostate: Secondary | ICD-10-CM | POA: Diagnosis not present

## 2022-03-23 MED ORDER — PIFLIFOLASTAT F 18 (PYLARIFY) INJECTION
9.0000 | Freq: Once | INTRAVENOUS | Status: AC
  Administered 2022-03-23: 9.2 via INTRAVENOUS

## 2022-03-27 ENCOUNTER — Ambulatory Visit
Admission: RE | Admit: 2022-03-27 | Discharge: 2022-03-27 | Disposition: A | Discharge status: Home / Self Care | Payer: Medicare Other | Source: Ambulatory Visit | Attending: Radiation Oncology | Admitting: Radiation Oncology

## 2022-03-27 ENCOUNTER — Other Ambulatory Visit: Payer: Self-pay

## 2022-03-27 VITALS — BP 131/68 | HR 69 | Temp 97.9°F | Resp 18 | Wt 179.0 lb

## 2022-03-27 DIAGNOSIS — N323 Diverticulum of bladder: Secondary | ICD-10-CM | POA: Diagnosis not present

## 2022-03-27 DIAGNOSIS — C61 Malignant neoplasm of prostate: Secondary | ICD-10-CM

## 2022-03-27 DIAGNOSIS — Z8616 Personal history of COVID-19: Secondary | ICD-10-CM | POA: Diagnosis not present

## 2022-03-27 DIAGNOSIS — I10 Essential (primary) hypertension: Secondary | ICD-10-CM | POA: Diagnosis not present

## 2022-03-27 DIAGNOSIS — Z79899 Other long term (current) drug therapy: Secondary | ICD-10-CM | POA: Insufficient documentation

## 2022-03-27 DIAGNOSIS — Z191 Hormone sensitive malignancy status: Secondary | ICD-10-CM | POA: Diagnosis not present

## 2022-03-27 DIAGNOSIS — Z87891 Personal history of nicotine dependence: Secondary | ICD-10-CM | POA: Insufficient documentation

## 2022-03-27 DIAGNOSIS — N401 Enlarged prostate with lower urinary tract symptoms: Secondary | ICD-10-CM

## 2022-03-27 DIAGNOSIS — Z7982 Long term (current) use of aspirin: Secondary | ICD-10-CM | POA: Insufficient documentation

## 2022-03-27 MED ORDER — FINASTERIDE 5 MG PO TABS
5.0000 mg | ORAL_TABLET | Freq: Every day | ORAL | 5 refills | Status: DC
  Filled 2022-03-27: qty 30, 30d supply, fill #0
  Filled 2022-04-29: qty 30, 30d supply, fill #1
  Filled 2022-05-29: qty 30, 30d supply, fill #2
  Filled 2022-07-03: qty 30, 30d supply, fill #3
  Filled 2022-07-31: qty 30, 30d supply, fill #4
  Filled 2022-08-27: qty 30, 30d supply, fill #5

## 2022-03-27 MED ORDER — TAMSULOSIN HCL 0.4 MG PO CAPS
0.8000 mg | ORAL_CAPSULE | Freq: Every day | ORAL | 5 refills | Status: DC
  Filled 2022-03-27: qty 60, 30d supply, fill #0
  Filled 2022-04-29: qty 60, 30d supply, fill #1
  Filled 2022-06-01: qty 60, 30d supply, fill #2
  Filled 2022-07-03: qty 60, 30d supply, fill #3
  Filled 2022-08-12: qty 60, 30d supply, fill #4
  Filled 2022-09-12: qty 60, 30d supply, fill #5

## 2022-03-27 NOTE — Progress Notes (Signed)
Radiation Oncology         [phone] ________________________________  Initial Outpatient Consultation  Name: Bryan Sullivan MRN: [id number]  Date: 03/27/2022  DOB: [birth year]  EH:[id number]  Lucas Mallow, MD   REFERRING PHYSICIAN: Lucas Mallow, MD  DIAGNOSIS: 76 y.o. gentleman with Stage T1c adenocarcinoma of the prostate with Gleason score of 4+4, and PSA of 5.15 (10.4 adjusted for finasteride).    ICD-10-CM   1. Malignant neoplasm of prostate ([place])  C61       HISTORY OF PRESENT ILLNESS: Bryan Sullivan is a 76 y.o. male with a diagnosis of prostate cancer. He has a longstanding history of bothersome LUTS and has been on Flomax and finasteride since at least 2020. He was initially referred to Dr. Gloriann Loan back in 11/2018 for an elevated PSA of 4.2 (8.4 corrected for finasteride), digital rectal examination was performed at that time revealing a 1 cm nodule at the right apex towards the midline. He was scheduled for prostate biopsy at that time but cancelled the appointment. He was seen back in 05/2019, was again scheduled for prostate biopsy, and again did not follow through.  He returned to Dr. Gloriann Loan on 02/02/22, digital rectal examination performed at that visit revealed significant nodularity at the right apex. A repeat PSA obtained that day showed an increase to 5.15 (10.4 adjusted for finasteride). The patient proceeded to transrectal ultrasound with 12 biopsies of the prostate on 02/28/22.  The prostate volume measured 27.78 cc.  Out of 12 core biopsies, 4 were positive.  The maximum Gleason score was 4+4, and this was seen in the left mid lateral (with perineural invasion) and left apex. Additionally, Gleason 4+3 was seen in the left apex lateral (with PNI) and left mid.  He underwent staging PSMA PET scan on 03/23/22 showing primary prostate adenocarcinoma in the anterior apex but no evidence of metastatic adenopathy, visceral metastasis, or skeletal  metastasis.  The patient reviewed the biopsy and imaging results with his urologist and he has kindly been referred today for discussion of potential radiation treatment options. He is here with his 2 sons who help serve as Optometrist for him. He received Mills Koller ADT on 03/16/22 and is scheduled for an Eligard injection in November.   PREVIOUS RADIATION THERAPY: No  PAST MEDICAL HISTORY:  Past Medical History:  Diagnosis Date   Asthma    As child    BPH (benign prostatic hyperplasia)    COVID-19 09/19/2018   COVID-19 virus infection 09/18/2018   Enlarged prostate Dx 2001   Hypertension    Dx at age 75   Lumbar radiculopathy    Open angle with borderline findings and high glaucoma risk in left eye    Primary open angle glaucoma of right eye, severe stage       PAST SURGICAL HISTORY: Past Surgical History:  Procedure Laterality Date   CATARACT EXTRACTION W/ INTRAOCULAR LENS  IMPLANT, BILATERAL Bilateral    CORNEAL TRANSPLANT Right    EYE SURGERY      FAMILY HISTORY:  Family History  Problem Relation Age of Onset   Diabetes Mother    Asthma Father    Hypertension Father    Asthma Sister     SOCIAL HISTORY:  Social History   Socioeconomic History   Marital status: Married    Spouse name: Not on file   Number of children: Not on file   Years of education: Not on file   Highest education level: Not on  file  Occupational History   Not on file  Tobacco Use   Smoking status: Former    Types: Cigarettes    Quit date: 09/21/1968    Years since quitting: 53.5   Smokeless tobacco: Never   Tobacco comments:    "quit smoking cigarettes in 1970"  Vaping Use   Vaping Use: Never used  Substance and Sexual Activity   Alcohol use: No   Drug use: No   Sexual activity: Not on file  Other Topics Concern   Not on file  Social History Narrative   From [place]   Moved to [place] in 2014    Social Determinants of Health   Financial Resource Strain: Not on file  Food Insecurity:  Not on file  Transportation Needs: Not on file  Physical Activity: Not on file  Stress: Not on file  Social Connections: Not on file  Intimate Partner Violence: Not on file    ALLERGIES: Patient has no known allergies.  MEDICATIONS:  Current Outpatient Medications  Medication Sig Dispense Refill   acetaminophen (TYLENOL) 500 MG tablet Take 500 mg by mouth as needed.     albuterol (PROVENTIL) (2.5 MG/3ML) 0.083% nebulizer solution Take 3 mLs (2.5 mg total) by nebulization every 6 (six) hours as needed for wheezing or shortness of breath. 150 mL 3   albuterol (VENTOLIN HFA) 108 (90 Base) MCG/ACT inhaler Inhale one puff by mouth every 4-6 hours. 18 g 2   amLODipine (NORVASC) 2.5 MG tablet Take one tablet (2.5 mg dose) by mouth every 12 (twelve) hours. Hold if systolic blood pressure less than 115 60 tablet 6   amLODipine (NORVASC) 2.5 MG tablet Take one tablet (2.5 mg dose) by mouth every 12 (twelve) hours. Hold if systolic blood pressure less than 115 180 tablet 1   amLODipine (NORVASC) 2.5 MG tablet Take 1 tablet (2.5 mg total) by mouth 2 (two) times daily. 180 tablet 1   aspirin 81 MG EC tablet Take 1 tablet (81 mg total) by mouth daily. 30 tablet 0   atorvastatin (LIPITOR) 40 MG tablet Take 1 tablet (40 mg total) by mouth daily. 90 tablet 1   atropine 1 % ophthalmic solution Place 1 drop into the right eye once daily 2 mL 11   atropine 1 % ophthalmic solution Place 1 drop into the right eye once daily as needed 5 mL 3   atropine 1 % ophthalmic solution Place 1 drop into the right eye once daily as needed. 5 mL 6   Budeson-Glycopyrrol-Formoterol (BREZTRI AEROSPHERE) 160-9-4.8 MCG/ACT AERO Inhale 2 puffs into the lungs in the morning and at bedtime. 10.7 g 4   diclofenac Sodium (VOLTAREN) 1 % GEL APPLY 4 G TOPICALLY 4 (FOUR) TIMES DAILY. 300 g 3   dorzolamide (TRUSOPT) 2 % ophthalmic solution Place 1 drop into the right eye 3 (three) times daily. 30 mL 3   dorzolamide (TRUSOPT) 2 %  ophthalmic solution Instill 1 drop Right Eye 3 times a day 10 mL 11   dorzolamide (TRUSOPT) 2 % ophthalmic solution Place 1 drop into the right eye 2 (two) times daily 10 mL 6   dorzolamide (TRUSOPT) 2 % ophthalmic solution Place 1 drop into the right eye 3 (three) times daily 10 mL 6   doxycycline (MONODOX) 50 MG capsule Take 1 capsule (50 mg total) by mouth once daily 30 capsule 3   ergocalciferol (VITAMIN D2) 1.25 MG (50000 UT) capsule Take 1 capsule (50,000 Units total) by mouth once a week. [place]  capsule 1   erythromycin ophthalmic ointment Use 4 times a day and at bedtime 3.5 g 11   erythromycin ophthalmic ointment Use 2 times daily 3.5 g 11   erythromycin ophthalmic ointment apply 2 times a day to affected eye(s) as directed 3.5 g 11   famotidine (PEPCID) 20 MG tablet Take 1 tablet (20 mg total) by mouth daily. 90 tablet 1   famotidine (PEPCID) 20 MG tablet Take 1 tablet (20 mg total) by mouth daily. 90 tablet 1   finasteride (PROSCAR) 5 MG tablet Take 1 tablet (5 mg total) by mouth daily. 30 tablet 0   fluorometholone (FML) 0.1 % ophthalmic suspension INSTILL 1 DROP INTO RIGHT EYE TWICE A DAY 5 mL 11   fluticasone (FLONASE) 50 MCG/ACT nasal spray Place 2 sprays into both nostrils daily. 16 g 2   isosorbide mononitrate (IMDUR) 30 MG 24 hr tablet TAKE ONE TABLET (30 MG DOSE) BY MOUTH DAILY. 30 tablet 5   isosorbide mononitrate (IMDUR) 30 MG 24 hr tablet Take one tablet (30 mg dose) by mouth daily. 45 tablet 0   latanoprost (XALATAN) 0.005 % ophthalmic solution Instill 1 drop Both Eyes at bedtime 2.5 mL 3   latanoprost (XALATAN) 0.005 % ophthalmic solution Place 1 drop into both eyes at bedtime 7.5 mL 4   latanoprost (XALATAN) 0.005 % ophthalmic solution Place 1 drop into both eyes at bedtime 7.5 mL 6   levofloxacin (LEVAQUIN) 750 MG tablet Take 1 tablet by mouth on the morning of procedure. 1 tablet 0   metoprolol succinate (TOPROL-XL) 25 MG 24 hr tablet Take one tablet (25 mg dose) by mouth  daily. 45 tablet 0   ofloxacin (OCUFLOX) 0.3 % ophthalmic solution Place 1 drop into the right eye 3 (three) times a day for 30 days 10 mL 0   omeprazole (PRILOSEC) 20 MG capsule Take 1 capsule (20 mg total) by mouth daily. 90 capsule 0   ranolazine (RANEXA) 500 MG 12 hr tablet Take 1 tablet (500 mg total) by mouth 2 (two) times daily. 60 tablet 2   silodosin (RAPAFLO) 8 MG CAPS capsule take 1 capsule by mouth once daily 30 capsule 3   tamsulosin (FLOMAX) 0.4 MG CAPS capsule Take 2 capsules (0.8 mg total) by mouth daily. 60 capsule 0   timolol (TIMOPTIC) 0.5 % ophthalmic solution Place 1 drop into the right eye 2 (two) times daily 10 mL 11   timolol (TIMOPTIC) 0.5 % ophthalmic solution Place 1 drop into both eyes 2 (two) times daily 10 mL 11   valsartan (DIOVAN) 80 MG tablet Take 1 tablet (80 mg total) by mouth daily. 90 tablet 2   valsartan (DIOVAN) 80 MG tablet Take 1 tablet (80 mg total) by mouth daily. 90 tablet 1   No current facility-administered medications for this encounter.    REVIEW OF SYSTEMS:  On review of systems, the patient reports that he is doing well overall. He denies any chest pain, shortness of breath, cough, fevers, chills, night sweats, unintended weight changes. He denies abdominal pain, nausea or vomiting. He denies any new musculoskeletal or joint aches or pains. His IPSS was 13, indicating moderate urinary symptoms but he has been out of his Flomax and Finasteride for several weeks. His SHIM was 1, indicating he has severe erectile dysfunction. A complete review of systems is obtained and is otherwise negative.    PHYSICAL EXAM:  Wt Readings from Last 3 Encounters:  02/16/22 180 lb (81.6 kg)  05/31/21 188 lb  2 oz (85.3 kg)  04/19/21 188 lb (85.3 kg)   Temp Readings from Last 3 Encounters:  02/16/22 98.7 F (37.1 C) (Oral)  04/20/21 98.4 F (36.9 C) (Oral)  12/19/20 98.5 F (36.9 C)   BP Readings from Last 3 Encounters:  02/17/22 (!) 119/92  05/31/21 134/78   04/20/21 (!) 142/66   Pulse Readings from Last 3 Encounters:  02/17/22 82  05/31/21 79  04/20/21 61    /10  In general this is a well appearing [place] male in no acute distress. He's alert and oriented x4 and appropriate throughout the examination. Cardiopulmonary assessment is negative for acute distress, and he exhibits normal effort.     KPS = 100  100 - Normal; no complaints; no evidence of disease. 90   - Able to carry on normal activity; minor signs or symptoms of disease. 80   - Normal activity with effort; some signs or symptoms of disease. 57   - Cares for self; unable to carry on normal activity or to do active work. 60   - Requires occasional assistance, but is able to care for most of his personal needs. 50   - Requires considerable assistance and frequent medical care. 79   - Disabled; requires special care and assistance. 51   - Severely disabled; hospital admission is indicated although death not imminent. 81   - Very sick; hospital admission necessary; active supportive treatment necessary. 10   - Moribund; fatal processes progressing rapidly. 0     - Dead  Karnofsky DA, Abelmann [place], Craver LS and Burchenal [facility] [phone]) The use of the nitrogen mustards in the palliative treatment of carcinoma: with particular reference to bronchogenic carcinoma Cancer 1 634-56  LABORATORY DATA:  Lab Results  Component Value Date   WBC 9.9 02/16/2022   HGB 13.7 02/16/2022   HCT 43.8 02/16/2022   MCV 89.2 02/16/2022   PLT 229 02/16/2022   Lab Results  Component Value Date   NA 138 02/16/2022   K 3.9 02/16/2022   CL 106 02/16/2022   CO2 25 02/16/2022   Lab Results  Component Value Date   ALT 15 04/19/2021   AST 20 04/19/2021   ALKPHOS 74 04/19/2021   BILITOT 0.5 04/19/2021     RADIOGRAPHY: NM PET (PSMA) SKULL TO MID THIGH  Result Date: 03/26/2022 CLINICAL DATA:  Prostate carcinoma with biochemical recurrence. PSA equal 5.5 EXAM: NUCLEAR MEDICINE PET SKULL BASE TO  THIGH TECHNIQUE: 9.2 mCi F18 Piflufolastat (Pylarify) was injected intravenously. Full-ring PET imaging was performed from the skull base to thigh after the radiotracer. CT data was obtained and used for attenuation correction and anatomic localization. COMPARISON:  None Available. FINDINGS: NECK No radiotracer activity in neck lymph nodes. Incidental CT finding: None. CHEST No radiotracer accumulation within mediastinal or hilar lymph nodes. No suspicious pulmonary nodules on the CT scan. Incidental CT finding: None. ABDOMEN/PELVIS Prostate: Focal activity in the anterior apex midline with SUV max equal 6.1. Lymph nodes: No abnormal radiotracer accumulation within pelvic or abdominal nodes. Liver: No evidence of liver metastasis. Incidental CT finding: Posterior LEFT bladder diverticulum. SKELETON No focal activity to suggest skeletal metastasis. No sclerotic lesions. IMPRESSION: 1. Primary prostate adenocarcinoma in the anterior apex. 2. No evidence metastatic adenopathy in the pelvis or periaortic retroperitoneum. 3. No evidence of visceral metastasis or skeletal metastasis. Electronically Signed   By: Suzy Bouchard M.D.   On: 03/26/2022 16:39      IMPRESSION/PLAN: 1. 76 y.o. gentleman with Stage T1c  adenocarcinoma of the prostate with Gleason score of 4+4, and PSA of 5.15 (10.4 adjusted for finasteride).  We discussed the patient's workup and outlined the nature of prostate cancer in this setting. The patient's T stage, Gleason's score, and PSA put him into the high risk group. Accordingly, he is eligible for a variety of potential treatment options including LT-ADT in combination with 8 weeks of external radiation, 5 weeks of external radiation with an upfront brachytherapy boost, or prostatectomy. We discussed the available radiation techniques, and focused on the details and logistics of delivery. The patient is not an ideal candidate for brachytherapy boost given his current small gland size prior to  starting ADT. We discussed and outlined the risks, benefits, short and long-term effects associated with radiotherapy and compared and contrasted these with prostatectomy. We discussed the role of SpaceOAR gel in reducing the rectal toxicity associated with radiotherapy. We also detailed the role of ADT in the treatment of high risk prostate cancer and outlined the associated side effects that could be expected with this therapy.  He was encouraged to ask questions that were answered to his stated satisfaction.  At the conclusion of our conversation, the patient is interested in moving forward with 8 weeks of external beam therapy in combination with ADT. He has already received his first ADT injection on 03/16/22 so we will share our discussion with Dr. Gloriann Loan and make arrangements for fiducial markers and SpaceOAR gel placement in Jan. 2024, prior to simulation, to reduce rectal toxicity from radiotherapy. He was interested in staying on finasteride and flomax but was out of both Rxs so I refilled those today. The patient appears to have a good understanding of his disease and our treatment recommendations which are of curative intent and is in agreement with the stated plan. Therefore, we will move forward with treatment planning accordingly, in anticipation of beginning IMRT approximately 2 months after starting ADT.    We personally spent 70 minutes in this encounter including chart review, reviewing radiological studies, meeting face-to-face with the patient, entering orders and completing documentation.    Nicholos Johns, PA-C    Tyler Pita, MD  [facility] Direct Dial: [phone]  Fax: [phone] [email or web address]  Skype  [email or web address]   This document serves as a record of services personally performed by Tyler Pita, MD and Freeman Caldron, PA-C. It was created on their behalf by Wilburn Mylar, a trained medical scribe. The creation of this record is based  on the scribe's personal observations and the provider's statements to them. This document has been checked and approved by the attending provider.

## 2022-03-28 DIAGNOSIS — I491 Atrial premature depolarization: Secondary | ICD-10-CM | POA: Diagnosis not present

## 2022-03-28 DIAGNOSIS — I10 Essential (primary) hypertension: Secondary | ICD-10-CM | POA: Diagnosis not present

## 2022-03-28 DIAGNOSIS — R9431 Abnormal electrocardiogram [ECG] [EKG]: Secondary | ICD-10-CM | POA: Diagnosis not present

## 2022-03-28 DIAGNOSIS — R0789 Other chest pain: Secondary | ICD-10-CM | POA: Diagnosis not present

## 2022-03-28 DIAGNOSIS — I2510 Atherosclerotic heart disease of native coronary artery without angina pectoris: Secondary | ICD-10-CM | POA: Diagnosis not present

## 2022-03-28 DIAGNOSIS — R0602 Shortness of breath: Secondary | ICD-10-CM | POA: Diagnosis not present

## 2022-03-28 DIAGNOSIS — I480 Paroxysmal atrial fibrillation: Secondary | ICD-10-CM

## 2022-03-28 HISTORY — DX: Paroxysmal atrial fibrillation: I48.0

## 2022-04-04 ENCOUNTER — Other Ambulatory Visit: Payer: Self-pay | Admitting: Urology

## 2022-04-11 ENCOUNTER — Other Ambulatory Visit: Payer: Self-pay

## 2022-04-11 MED ORDER — METOPROLOL SUCCINATE ER 25 MG PO TB24
ORAL_TABLET | ORAL | 3 refills | Status: DC
  Filled 2022-04-11: qty 60, 30d supply, fill #0
  Filled 2022-05-13: qty 60, 30d supply, fill #1
  Filled 2022-06-11: qty 60, 30d supply, fill #2
  Filled 2022-07-12 (×2): qty 60, 30d supply, fill #3

## 2022-04-11 MED ORDER — ELIQUIS 5 MG PO TABS
5.0000 mg | ORAL_TABLET | Freq: Two times a day (BID) | ORAL | 5 refills | Status: DC
  Filled 2022-04-11: qty 60, 30d supply, fill #0
  Filled 2022-05-13: qty 60, 30d supply, fill #1
  Filled 2022-06-12 (×2): qty 60, 30d supply, fill #2

## 2022-04-12 DIAGNOSIS — I10 Essential (primary) hypertension: Secondary | ICD-10-CM | POA: Diagnosis not present

## 2022-04-12 DIAGNOSIS — R590 Localized enlarged lymph nodes: Secondary | ICD-10-CM | POA: Diagnosis not present

## 2022-04-12 DIAGNOSIS — R0789 Other chest pain: Secondary | ICD-10-CM | POA: Diagnosis not present

## 2022-04-12 DIAGNOSIS — I480 Paroxysmal atrial fibrillation: Secondary | ICD-10-CM | POA: Diagnosis not present

## 2022-04-12 DIAGNOSIS — I2510 Atherosclerotic heart disease of native coronary artery without angina pectoris: Secondary | ICD-10-CM | POA: Diagnosis not present

## 2022-04-12 DIAGNOSIS — J45909 Unspecified asthma, uncomplicated: Secondary | ICD-10-CM | POA: Diagnosis not present

## 2022-04-12 DIAGNOSIS — R002 Palpitations: Secondary | ICD-10-CM | POA: Diagnosis not present

## 2022-04-12 DIAGNOSIS — C61 Malignant neoplasm of prostate: Secondary | ICD-10-CM | POA: Diagnosis not present

## 2022-04-13 ENCOUNTER — Other Ambulatory Visit: Payer: Self-pay

## 2022-04-13 MED ORDER — ALBUTEROL SULFATE HFA 108 (90 BASE) MCG/ACT IN AERS
2.0000 | INHALATION_SPRAY | RESPIRATORY_TRACT | 2 refills | Status: DC | PRN
  Filled 2022-04-13: qty 18, 17d supply, fill #0
  Filled 2022-05-15: qty 18, 17d supply, fill #1

## 2022-04-16 ENCOUNTER — Inpatient Hospital Stay (HOSPITAL_COMMUNITY)
Admission: EM | Admit: 2022-04-16 | Discharge: 2022-04-18 | DRG: 194 | Disposition: A | Discharge status: Home / Self Care | Payer: Medicare Other | Attending: Internal Medicine | Admitting: Internal Medicine

## 2022-04-16 ENCOUNTER — Other Ambulatory Visit: Payer: Self-pay

## 2022-04-16 ENCOUNTER — Encounter (HOSPITAL_COMMUNITY): Payer: Self-pay

## 2022-04-16 ENCOUNTER — Emergency Department (HOSPITAL_COMMUNITY): Payer: Medicare Other

## 2022-04-16 DIAGNOSIS — I959 Hypotension, unspecified: Secondary | ICD-10-CM

## 2022-04-16 DIAGNOSIS — M1990 Unspecified osteoarthritis, unspecified site: Secondary | ICD-10-CM | POA: Diagnosis present

## 2022-04-16 DIAGNOSIS — Z79899 Other long term (current) drug therapy: Secondary | ICD-10-CM

## 2022-04-16 DIAGNOSIS — N400 Enlarged prostate without lower urinary tract symptoms: Secondary | ICD-10-CM | POA: Diagnosis present

## 2022-04-16 DIAGNOSIS — Z947 Corneal transplant status: Secondary | ICD-10-CM | POA: Diagnosis not present

## 2022-04-16 DIAGNOSIS — I480 Paroxysmal atrial fibrillation: Secondary | ICD-10-CM | POA: Diagnosis not present

## 2022-04-16 DIAGNOSIS — Z8616 Personal history of COVID-19: Secondary | ICD-10-CM | POA: Diagnosis not present

## 2022-04-16 DIAGNOSIS — H409 Unspecified glaucoma: Secondary | ICD-10-CM | POA: Diagnosis not present

## 2022-04-16 DIAGNOSIS — C61 Malignant neoplasm of prostate: Secondary | ICD-10-CM | POA: Diagnosis not present

## 2022-04-16 DIAGNOSIS — J45901 Unspecified asthma with (acute) exacerbation: Secondary | ICD-10-CM | POA: Diagnosis present

## 2022-04-16 DIAGNOSIS — I10 Essential (primary) hypertension: Secondary | ICD-10-CM | POA: Diagnosis not present

## 2022-04-16 DIAGNOSIS — E7800 Pure hypercholesterolemia, unspecified: Secondary | ICD-10-CM | POA: Diagnosis present

## 2022-04-16 DIAGNOSIS — K219 Gastro-esophageal reflux disease without esophagitis: Secondary | ICD-10-CM | POA: Diagnosis present

## 2022-04-16 DIAGNOSIS — J440 Chronic obstructive pulmonary disease with acute lower respiratory infection: Secondary | ICD-10-CM | POA: Diagnosis not present

## 2022-04-16 DIAGNOSIS — Z1152 Encounter for screening for COVID-19: Secondary | ICD-10-CM

## 2022-04-16 DIAGNOSIS — Z8546 Personal history of malignant neoplasm of prostate: Secondary | ICD-10-CM

## 2022-04-16 DIAGNOSIS — R031 Nonspecific low blood-pressure reading: Secondary | ICD-10-CM | POA: Diagnosis present

## 2022-04-16 DIAGNOSIS — N19 Unspecified kidney failure: Secondary | ICD-10-CM | POA: Diagnosis not present

## 2022-04-16 DIAGNOSIS — M179 Osteoarthritis of knee, unspecified: Secondary | ICD-10-CM | POA: Diagnosis present

## 2022-04-16 DIAGNOSIS — F419 Anxiety disorder, unspecified: Secondary | ICD-10-CM | POA: Diagnosis present

## 2022-04-16 DIAGNOSIS — H5789 Other specified disorders of eye and adnexa: Secondary | ICD-10-CM | POA: Diagnosis present

## 2022-04-16 DIAGNOSIS — Z825 Family history of asthma and other chronic lower respiratory diseases: Secondary | ICD-10-CM

## 2022-04-16 DIAGNOSIS — I2510 Atherosclerotic heart disease of native coronary artery without angina pectoris: Secondary | ICD-10-CM | POA: Diagnosis not present

## 2022-04-16 DIAGNOSIS — Z833 Family history of diabetes mellitus: Secondary | ICD-10-CM

## 2022-04-16 DIAGNOSIS — E782 Mixed hyperlipidemia: Secondary | ICD-10-CM | POA: Diagnosis present

## 2022-04-16 DIAGNOSIS — Z87891 Personal history of nicotine dependence: Secondary | ICD-10-CM

## 2022-04-16 DIAGNOSIS — H401113 Primary open-angle glaucoma, right eye, severe stage: Secondary | ICD-10-CM | POA: Diagnosis present

## 2022-04-16 DIAGNOSIS — K2100 Gastro-esophageal reflux disease with esophagitis, without bleeding: Secondary | ICD-10-CM | POA: Diagnosis not present

## 2022-04-16 DIAGNOSIS — E860 Dehydration: Secondary | ICD-10-CM | POA: Diagnosis present

## 2022-04-16 DIAGNOSIS — Z8249 Family history of ischemic heart disease and other diseases of the circulatory system: Secondary | ICD-10-CM | POA: Diagnosis not present

## 2022-04-16 DIAGNOSIS — J181 Lobar pneumonia, unspecified organism: Principal | ICD-10-CM | POA: Diagnosis present

## 2022-04-16 DIAGNOSIS — J189 Pneumonia, unspecified organism: Secondary | ICD-10-CM | POA: Diagnosis present

## 2022-04-16 DIAGNOSIS — M5416 Radiculopathy, lumbar region: Secondary | ICD-10-CM | POA: Diagnosis present

## 2022-04-16 DIAGNOSIS — N179 Acute kidney failure, unspecified: Secondary | ICD-10-CM | POA: Diagnosis present

## 2022-04-16 DIAGNOSIS — Z7901 Long term (current) use of anticoagulants: Secondary | ICD-10-CM | POA: Diagnosis not present

## 2022-04-16 DIAGNOSIS — J441 Chronic obstructive pulmonary disease with (acute) exacerbation: Secondary | ICD-10-CM | POA: Diagnosis present

## 2022-04-16 DIAGNOSIS — R54 Age-related physical debility: Secondary | ICD-10-CM | POA: Diagnosis not present

## 2022-04-16 DIAGNOSIS — J168 Pneumonia due to other specified infectious organisms: Secondary | ICD-10-CM | POA: Diagnosis not present

## 2022-04-16 DIAGNOSIS — R059 Cough, unspecified: Secondary | ICD-10-CM | POA: Diagnosis not present

## 2022-04-16 DIAGNOSIS — J4489 Other specified chronic obstructive pulmonary disease: Secondary | ICD-10-CM | POA: Diagnosis not present

## 2022-04-16 DIAGNOSIS — H4010X0 Unspecified open-angle glaucoma, stage unspecified: Secondary | ICD-10-CM | POA: Diagnosis not present

## 2022-04-16 DIAGNOSIS — R0602 Shortness of breath: Secondary | ICD-10-CM | POA: Diagnosis not present

## 2022-04-16 DIAGNOSIS — R079 Chest pain, unspecified: Secondary | ICD-10-CM | POA: Diagnosis not present

## 2022-04-16 DIAGNOSIS — Z7951 Long term (current) use of inhaled steroids: Secondary | ICD-10-CM

## 2022-04-16 LAB — BASIC METABOLIC PANEL WITH GFR
Anion gap: 7 (ref 5–15)
BUN: 26 mg/dL — ABNORMAL HIGH (ref 8–23)
CO2: 26 mmol/L (ref 22–32)
Calcium: 8.7 mg/dL — ABNORMAL LOW (ref 8.9–10.3)
Chloride: 102 mmol/L (ref 98–111)
Creatinine, Ser: 1.67 mg/dL — ABNORMAL HIGH (ref 0.61–1.24)
GFR, Estimated: 42 mL/min — ABNORMAL LOW
Glucose, Bld: 135 mg/dL — ABNORMAL HIGH (ref 70–99)
Potassium: 4.4 mmol/L (ref 3.5–5.1)
Sodium: 135 mmol/L (ref 135–145)

## 2022-04-16 LAB — CBC
HCT: 37.6 % — ABNORMAL LOW (ref 39.0–52.0)
Hemoglobin: 11.9 g/dL — ABNORMAL LOW (ref 13.0–17.0)
MCH: 27.6 pg (ref 26.0–34.0)
MCHC: 31.6 g/dL (ref 30.0–36.0)
MCV: 87.2 fL (ref 80.0–100.0)
Platelets: 217 10*3/uL (ref 150–400)
RBC: 4.31 MIL/uL (ref 4.22–5.81)
RDW: 13.4 % (ref 11.5–15.5)
WBC: 13.6 10*3/uL — ABNORMAL HIGH (ref 4.0–10.5)
nRBC: 0 % (ref 0.0–0.2)

## 2022-04-16 LAB — TROPONIN I (HIGH SENSITIVITY)
Troponin I (High Sensitivity): 9 ng/L (ref <18)
Troponin I (High Sensitivity): 10 ng/L

## 2022-04-16 LAB — LACTIC ACID, PLASMA
Lactic Acid, Venous: 1.3 mmol/L (ref 0.5–1.9)
Lactic Acid, Venous: 1.2 mmol/L (ref 0.5–1.9)

## 2022-04-16 LAB — RESP PANEL BY RT-PCR (FLU A&B, COVID) ARPGX2
Influenza A by PCR: NEGATIVE
Influenza B by PCR: NEGATIVE
SARS Coronavirus 2 by RT PCR: NEGATIVE

## 2022-04-16 MED ORDER — ZOLPIDEM TARTRATE 5 MG PO TABS
5.0000 mg | ORAL_TABLET | Freq: Every evening | ORAL | Status: DC | PRN
  Administered 2022-04-16: 5 mg via ORAL
  Filled 2022-04-16: qty 1

## 2022-04-16 MED ORDER — AZITHROMYCIN 250 MG PO TABS
500.0000 mg | ORAL_TABLET | Freq: Once | ORAL | Status: AC
  Administered 2022-04-16: 500 mg via ORAL
  Filled 2022-04-16: qty 2

## 2022-04-16 MED ORDER — GUAIFENESIN-DM 100-10 MG/5ML PO SYRP
5.0000 mL | ORAL_SOLUTION | ORAL | Status: DC | PRN
  Administered 2022-04-16 – 2022-04-18 (×2): 5 mL via ORAL
  Filled 2022-04-16 (×2): qty 10

## 2022-04-16 MED ORDER — APIXABAN 5 MG PO TABS
5.0000 mg | ORAL_TABLET | Freq: Two times a day (BID) | ORAL | Status: DC
  Administered 2022-04-16 – 2022-04-18 (×4): 5 mg via ORAL
  Filled 2022-04-16 (×4): qty 1

## 2022-04-16 MED ORDER — SODIUM CHLORIDE 0.9 % IV SOLN
500.0000 mg | INTRAVENOUS | Status: DC
  Administered 2022-04-17 – 2022-04-18 (×2): 500 mg via INTRAVENOUS
  Filled 2022-04-16 (×2): qty 5

## 2022-04-16 MED ORDER — SODIUM CHLORIDE 0.9 % IV SOLN
2.0000 g | INTRAVENOUS | Status: DC
  Administered 2022-04-17 – 2022-04-18 (×2): 2 g via INTRAVENOUS
  Filled 2022-04-16 (×2): qty 20

## 2022-04-16 MED ORDER — ACETAMINOPHEN 500 MG PO TABS
1000.0000 mg | ORAL_TABLET | Freq: Once | ORAL | Status: DC
  Filled 2022-04-16: qty 2

## 2022-04-16 MED ORDER — SODIUM CHLORIDE 0.9 % IV SOLN: INTRAVENOUS | Status: AC

## 2022-04-16 MED ORDER — IPRATROPIUM-ALBUTEROL 0.5-2.5 (3) MG/3ML IN SOLN
3.0000 mL | Freq: Once | RESPIRATORY_TRACT | Status: AC
  Administered 2022-04-16: 3 mL via RESPIRATORY_TRACT
  Filled 2022-04-16: qty 3

## 2022-04-16 MED ORDER — LACTATED RINGERS IV BOLUS
1000.0000 mL | Freq: Once | INTRAVENOUS | Status: AC
  Administered 2022-04-16: 1000 mL via INTRAVENOUS

## 2022-04-16 MED ORDER — HYDROCODONE-ACETAMINOPHEN 5-325 MG PO TABS
1.0000 | ORAL_TABLET | Freq: Four times a day (QID) | ORAL | Status: DC | PRN
  Administered 2022-04-16: 1 via ORAL
  Filled 2022-04-16: qty 1

## 2022-04-16 MED ORDER — SODIUM CHLORIDE 0.9 % IV SOLN
1.0000 g | Freq: Once | INTRAVENOUS | Status: AC
  Administered 2022-04-16: 1 g via INTRAVENOUS
  Filled 2022-04-16: qty 10

## 2022-04-16 NOTE — ED Provider Notes (Signed)
[facility] DEPT Provider Note   CSN: [id number] Arrival date & time: 04/16/22  1113     History  Chief Complaint  Patient presents with   Chest Pain   Shortness of Breath    Bryan Sullivan is a 76 y.o. male.   Chest Pain Associated symptoms: shortness of breath   Shortness of Breath Associated symptoms: chest pain     Pt complains of SOB and chest tightness for 2 days. Coughing more significantly and fatigued. No fevers no NV. States he didn't sleep well last night. Feels more fatigued now. Not particularly more SOB when reclined.   Seems that he has been more fatigued in general.  He indicates that he is not having chest pain as much is a circumferential chest tightness.  He states that he feels this way when he is wheezing often.  He denies any nausea or vomiting no exertional shortness of breath.  Denies any new leg swelling.  No history of blood clots in legs or lungs in the past.  He is not a cancer patient.    Home Medications Prior to Admission medications   Medication Sig Start Date End Date Taking? Authorizing Provider  acetaminophen (TYLENOL) 500 MG tablet Take 500 mg by mouth as needed.    [provider]  albuterol (PROVENTIL) (2.5 MG/3ML) 0.083% nebulizer solution Take 3 mLs (2.5 mg total) by nebulization every 6 (six) hours as needed for wheezing or shortness of breath. 09/12/21 01/24/22  Gildardo Pounds, NP  albuterol (VENTOLIN HFA) 108 (90 Base) MCG/ACT inhaler Inhale 2 puffs into the lungs every 4 (four) hours as needed. 04/13/22     amLODipine (NORVASC) 2.5 MG tablet Take 1 tablet (2.5 mg total) by mouth 2 (two) times daily. 02/23/22     apixaban (ELIQUIS) 5 MG TABS tablet Take 1 tablet (5 mg total) by mouth 2 (two) times daily. 04/11/22     atorvastatin (LIPITOR) 40 MG tablet Take 1 tablet (40 mg total) by mouth daily. 02/23/22     atropine 1 % ophthalmic solution Apply to eye. 03/14/22 03/14/23  [provider]  Budeson-Glycopyrrol-Formoterol (BREZTRI AEROSPHERE) 160-9-4.8 MCG/ACT AERO Inhale 2 puffs into the lungs in the morning and at bedtime. 05/31/21   Gildardo Pounds, NP  dorzolamide (TRUSOPT) 2 % ophthalmic solution Place 1 drop into the right eye 3 (three) times daily 03/14/22     doxycycline (MONODOX) 50 MG capsule Take 1 capsule (50 mg total) by mouth once daily 12/07/20     ergocalciferol (VITAMIN D2) 1.25 MG (50000 UT) capsule Take 1 capsule (50,000 Units total) by mouth once a week. 02/23/22   Kristie Cowman, MD  erythromycin ophthalmic ointment apply 2 times a day to affected eye(s) as directed 03/14/22     famotidine (PEPCID) 20 MG tablet Take 1 tablet (20 mg total) by mouth daily. 02/23/22     finasteride (PROSCAR) 5 MG tablet Take 1 tablet (5 mg total) by mouth daily. 03/27/22   Bruning, Ashlyn, PA-C  fluorometholone (FML) 0.1 % ophthalmic suspension INSTILL 1 DROP INTO RIGHT EYE TWICE A DAY 11/07/20     fluticasone (FLONASE) 50 MCG/ACT nasal spray Place 2 sprays into both nostrils daily. 11/18/18   Elsie Stain, MD  isosorbide mononitrate (IMDUR) 30 MG 24 hr tablet Take one tablet (30 mg dose) by mouth daily. 05/03/21     latanoprost (XALATAN) 0.005 % ophthalmic solution Apply to eye. 03/14/22   [provider]  levofloxacin (LEVAQUIN) 750  MG tablet Take 1 tablet by mouth on the morning of procedure. 02/02/22     metoprolol succinate (TOPROL-XL) 25 MG 24 hr tablet Take one tablet (25 mg dose) by mouth daily. 05/03/21     metoprolol succinate (TOPROL-XL) 25 MG 24 hr tablet Take one tablet (25 mg dose) by mouth every 12 (twelve) hours. Please, hold Metoprolol if systolic BP is less than 568 or HR is less than 60. 04/10/22     ofloxacin (OCUFLOX) 0.3 % ophthalmic solution Place 1 drop into the right eye 3 (three) times a day for 30 days 12/14/20     omeprazole (PRILOSEC) 20 MG capsule Take 1 capsule (20 mg total) by mouth daily. 08/03/21 11/01/21  Gildardo Pounds, NP  ranolazine  (RANEXA) 500 MG 12 hr tablet Take 1 tablet (500 mg total) by mouth 2 (two) times daily. 02/22/22     tamsulosin (FLOMAX) 0.4 MG CAPS capsule Take 2 capsules (0.8 mg total) by mouth daily. 03/27/22   Bruning, Ashlyn, PA-C  timolol (TIMOPTIC) 0.5 % ophthalmic solution Apply to eye. 03/14/22   [provider]  valsartan (DIOVAN) 80 MG tablet Take 1 tablet (80 mg total) by mouth daily. 02/23/22         Allergies    Patient has no known allergies.    Review of Systems   Review of Systems  Respiratory:  Positive for shortness of breath.   Cardiovascular:  Positive for chest pain.    Physical Exam Updated Vital Signs BP 116/69   Pulse 73   Temp 99.2 F (37.3 C) (Oral)   Resp 20   Ht '5\' 10"'$  (1.778 m)   Wt 82.6 kg   SpO2 96%   BMI 26.11 kg/m  Physical Exam Vitals and nursing note reviewed.  Constitutional:      General: He is not in acute distress. HENT:     Head: Normocephalic and atraumatic.     Nose: Nose normal.     Mouth/Throat:     Mouth: Mucous membranes are dry.  Eyes:     General: No scleral icterus. Cardiovascular:     Rate and Rhythm: Normal rate and regular rhythm.     Pulses: Normal pulses.     Heart sounds: Normal heart sounds.  Pulmonary:     Effort: Pulmonary effort is normal. No respiratory distress.     Breath sounds: Wheezing present.     Comments: Expiratory wheezing.  Not tachypneic, speaking full sentences Abdominal:     Palpations: Abdomen is soft.     Tenderness: There is no abdominal tenderness.  Musculoskeletal:     Cervical back: Normal range of motion.     Right lower leg: No edema.     Left lower leg: No edema.     Comments: No significant or asymmetric lower extremity edema  Skin:    General: Skin is warm and dry.     Capillary Refill: Capillary refill takes less than 2 seconds.  Neurological:     Mental Status: He is alert. Mental status is at baseline.  Psychiatric:        Mood and Affect: Mood normal.        Behavior:  Behavior normal.     ED Results / Procedures / Treatments   Labs (all labs ordered are listed, but only abnormal results are displayed) Labs Reviewed  BASIC METABOLIC PANEL - Abnormal; Notable for the following components:      Result Value   Glucose, Bld 135 (*)  BUN 26 (*)    Creatinine, Ser 1.67 (*)    Calcium 8.7 (*)    GFR, Estimated 42 (*)    All other components within normal limits  CBC - Abnormal; Notable for the following components:   WBC 13.6 (*)    Hemoglobin 11.9 (*)    HCT 37.6 (*)    All other components within normal limits  RESP PANEL BY RT-PCR (FLU A&B, COVID) ARPGX2  CULTURE, BLOOD (SINGLE)  LACTIC ACID, PLASMA  LACTIC ACID, PLASMA  TROPONIN I (HIGH SENSITIVITY)  TROPONIN I (HIGH SENSITIVITY)    EKG EKG Interpretation  Date/Time:  Monday April 16 2022 11:31:01 EST Ventricular Rate:  73 PR Interval:  254 QRS Duration: 100 QT Interval:  398 QTC Calculation: 439 R Axis:   -59 Text Interpretation: Sinus or ectopic atrial rhythm Prolonged PR interval Left anterior fascicular block Baseline wander in lead(s) I II aVR No significant change since last tracing Confirmed by Leanord Asal ([id number]) on 04/16/2022 4:24:28 PM  Radiology DG Chest 2 View  Result Date: 04/16/2022 CLINICAL DATA:  Provided history: Chest pain. Additional history provided: Chest pain and shortness of breath since yesterday. Productive cough. History of asthma, hypertension and smoking. EXAM: CHEST - 2 VIEW COMPARISON:  Prior chest radiographs 02/16/2022 and earlier. CT 03/23/2022. FINDINGS: Heart size within normal limits. Ill-defined opacities within the left lung base. Subtle ill-defined opacity is also present within the lateral right lung base. No evidence of pleural effusion or pneumothorax. Degenerative changes of the spine. IMPRESSION: Ill-defined opacities within the left lung base, suspicious for pneumonia given the provided history. Followup PA and lateral chest  radiographs are recommended in 3-4 weeks following a trial of antibiotic therapy to ensure resolution and exclude underlying malignancy. Subtle ill-defined opacities also present within the right lung base, which may reflect atelectasis or additional airspace disease. Electronically Signed   By: Kellie Simmering D.O.   On: 04/16/2022 12:23    Procedures Procedures    Medications Ordered in ED Medications  acetaminophen (TYLENOL) tablet 1,000 mg (1,000 mg Oral Patient Refused/Not Given 04/16/22 1540)  cefTRIAXone (ROCEPHIN) 1 g in sodium chloride 0.9 % 100 mL IVPB (1 g Intravenous New Bag/Given 04/16/22 1752)  azithromycin (ZITHROMAX) tablet 500 mg (500 mg Oral Given 04/16/22 1700)  ipratropium-albuterol (DUONEB) 0.5-2.5 (3) MG/3ML nebulizer solution 3 mL (3 mLs Nebulization Given 04/16/22 1700)  lactated ringers bolus 1,000 mL (1,000 mLs Intravenous New Bag/Given 04/16/22 1745)    ED Course/ Medical Decision Making/ A&P                           Medical Decision Making Amount and/or Complexity of Data Reviewed Labs: ordered. Radiology: ordered.  Risk OTC drugs. Prescription drug management. Decision regarding hospitalization.   This patient presents to the ED for concern of cough, shortness of breath, this involves a number of treatment options, and is a complaint that carries with it a moderate-to-high risk of complications and morbidity. A differential diagnosis was considered for the patient's symptoms which is discussed below:   The differential diagnosis of weakness includes but is not limited to neurologic causes (GBS, myasthenia gravis, CVA, MS, ALS, transverse myelitis, spinal cord injury, CVA, botulism, ) and other causes: ACS, Arrhythmia, syncope, orthostatic hypotension, sepsis, hypoglycemia, electrolyte disturbance, hypothyroidism, respiratory failure, symptomatic anemia, dehydration, heat injury, polypharmacy, malignancy.  SOB ddx includes pna most likely but also  considered  The causes for shortness of breath include but are  not limited to Cardiac (AHF, pericardial effusion and tamponade, arrhythmias, ischemia, etc) Respiratory (COPD, asthma, pneumonia, pneumothorax, primary pulmonary hypertension, PE/VQ mismatch) Hematological (anemia) Neuromuscular (ALS, Guillain-Barr, etc)    Co morbidities: Discussed in HPI   Brief History:  Pt complains of SOB and chest tightness for 2 days. Coughing more significantly and fatigued. No fevers no NV. States he didn't sleep well last night. Feels more fatigued now. Not particularly more SOB when reclined.   Seems that he has been more fatigued in general.  He indicates that he is not having chest pain as much is a circumferential chest tightness.  He states that he feels this way when he is wheezing often.  He denies any nausea or vomiting no exertional shortness of breath.  Denies any new leg swelling.  No history of blood clots in legs or lungs in the past.  He is not a cancer patient.    EMR reviewed including pt PMHx, past surgical history and past visits to ER.   See HPI for more details   Lab Tests:   I ordered and independently interpreted labs. Labs notable for leukocytosis of 13.6, BMP with mild elevation in creatinine and BUN consistent with mild prerenal AKI.  Electrolytes normal, lactic acid within normal limits at 1.3.  Troponin x2 within normal limits, COVID influenza negative   Imaging Studies:  Abnormal findings. I personally reviewed all imaging studies. Imaging notable for Left lower lobe pneumonia.   Cardiac Monitoring:  The patient was maintained on a cardiac monitor.  I personally viewed and interpreted the cardiac monitored which showed an underlying rhythm of: NSR EKG non-ischemic   Medicines ordered:  I ordered medication including DuoNeb, azithromycin, Rocephin, lactated Ringer's, Tylenol for PNA and COPD exacerbation Reevaluation of the patient after these  medicines showed that the patient improved I have reviewed the patients home medicines and have made adjustments as needed   Critical Interventions:     Consults/Attending Physician   I requested consultation with Jonelle Sidle,  and discussed lab and imaging findings as well as pertinent plan - they recommend: Admission   Reevaluation:  After the interventions noted above I re-evaluated patient and found that they have :improved   Social Determinants of Health:      Problem List / ED Course:  PNA -left lower lobe.  Started on Rocephin and azithromycin. Mild AKI likely prerenal in the setting of dehydration COPD exacerbation likely secondary to pneumonia.   Dispostion:  After consideration of the diagnostic results and the patients response to treatment, I feel that the patent would benefit from admission   Final Clinical Impression(s) / ED Diagnoses Final diagnoses:  Pneumonia of left lower lobe due to infectious organism  COPD exacerbation ([place])  Transient hypotension    Rx / DC Orders ED Discharge Orders     None         Tedd Sias, [place] 04/16/22 [place], [place], DO 04/17/22 [id number]

## 2022-04-16 NOTE — ED Provider Triage Note (Signed)
Emergency Medicine Provider Triage Evaluation Note  Bryan Sullivan , a 76 y.o. male  was evaluated in triage.  Pt complains of SOB and chest tightness for 2 days. Coughing more significantly and fatigued. No fevers no NV. States he didn't sleep well last night. Feels more fatigued now. Not particularly more SOB when reclined.   Review of Systems  Positive: CP, wheezing, fatigue Negative: Fever   Physical Exam  BP (!) 101/46 (BP Location: Right Arm)   Pulse 74   Temp 99.5 F (37.5 C) (Oral)   Resp 18   Ht '5\' 10"'$  (1.778 m)   Wt 82.6 kg   SpO2 95%   BMI 26.11 kg/m  Gen:   Awake, no distress   Resp:  Normal effort  MSK:   Moves extremities without difficulty  Other:  Faint end expiratory wheeze, lungs otherwise clear. No tachpynea. No LE edema.   Medical Decision Making  Medically screening exam initiated at 12:15 PM.  Appropriate orders placed.  Bryan Sullivan was informed that the remainder of the evaluation will be completed by another provider, this initial triage assessment does not replace that evaluation, and the importance of remaining in the ED until their evaluation is complete.  Feels tired and "not good"  Covid and flu test + CP workup   Bryan Sullivan, [place] 04/16/22 1218

## 2022-04-16 NOTE — ED Triage Notes (Signed)
Patient c/o chest pain and SOB since yesterday. Patient states he has a productive cough with "reddish" sputum x 2 days.

## 2022-04-16 NOTE — H&P (Signed)
History and Physical    Patient: Bryan Sullivan UEK:[id number] DOB: 1945/07/16 DOA: 04/16/2022 DOS: the patient was seen and examined on 04/16/2022 PCP: [facility]  Patient coming from: Home  Chief Complaint:  Chief Complaint  Patient presents with   Chest Pain   Shortness of Breath   HPI: Bryan Sullivan is a 76 y.o. male with medical history significant of asthma, COPD, BPH, essential hypertension, prostate cancer, GERD, coronary artery disease who presented to the ER with progressive shortness of breath cough and anxiety.  Symptoms started a few days ago. For the past 2 days he has felt tightness in his chest and has been coughing more significantly.  Patient has been unable to sleep at night due to the cough.  Shortness of breath is worse when he leans forward or reclines.  Patient came to the ER where he was seen and evaluated.  He was found to have findings consistent with lobar pneumonia.  Patient is being admitted to the hospital for further evaluation and treatment.  He denied any hemoptysis.  Denied any sick contact.  He does have some chest pain with deep breath.  Review of Systems: As mentioned in the history of present illness. All other systems reviewed and are negative. Past Medical History:  Diagnosis Date   Asthma    As child    BPH (benign prostatic hyperplasia)    COVID-19 09/19/2018   COVID-19 virus infection 09/18/2018   Enlarged prostate Dx 2001   Hypertension    Dx at age 31   Lumbar radiculopathy    Open angle with borderline findings and high glaucoma risk in left eye    Primary open angle glaucoma of right eye, severe stage    Past Surgical History:  Procedure Laterality Date   CATARACT EXTRACTION W/ INTRAOCULAR LENS  IMPLANT, BILATERAL Bilateral    CORNEAL TRANSPLANT Right    EYE SURGERY     Social History:  reports that he quit smoking about 53 years ago. His smoking use included cigarettes. He has never used smokeless tobacco. He  reports that he does not drink alcohol and does not use drugs.  No Known Allergies  Family History  Problem Relation Age of Onset   Diabetes Mother    Asthma Father    Hypertension Father    Asthma Sister     Prior to Admission medications   Medication Sig Start Date End Date Taking? Authorizing Provider  acetaminophen (TYLENOL) 500 MG tablet Take 500 mg by mouth as needed for moderate pain.   Yes [provider]  albuterol (PROVENTIL) (2.5 MG/3ML) 0.083% nebulizer solution Take 3 mLs (2.5 mg total) by nebulization every 6 (six) hours as needed for wheezing or shortness of breath. 09/12/21 04/16/22 Yes Gildardo Pounds, NP  albuterol (VENTOLIN HFA) 108 (90 Base) MCG/ACT inhaler Inhale 2 puffs into the lungs every 4 (four) hours as needed. Patient taking differently: Inhale 2 puffs into the lungs every 4 (four) hours as needed for wheezing or shortness of breath. 04/13/22  Yes   amLODipine (NORVASC) 2.5 MG tablet Take 1 tablet (2.5 mg total) by mouth 2 (two) times daily. 02/23/22  Yes   apixaban (ELIQUIS) 5 MG TABS tablet Take 1 tablet (5 mg total) by mouth 2 (two) times daily. 04/11/22  Yes   atorvastatin (LIPITOR) 40 MG tablet Take 1 tablet (40 mg total) by mouth daily. 02/23/22  Yes   atropine 1 % ophthalmic solution Place 1 drop into the right eye 2 (two)  times daily. 03/14/22 03/14/23 Yes [provider]  Budeson-Glycopyrrol-Formoterol (BREZTRI AEROSPHERE) 160-9-4.8 MCG/ACT AERO Inhale 2 puffs into the lungs in the morning and at bedtime. 05/31/21  Yes Gildardo Pounds, NP  dorzolamide (TRUSOPT) 2 % ophthalmic solution Place 1 drop into the right eye 3 (three) times daily 03/14/22  Yes   finasteride (PROSCAR) 5 MG tablet Take 1 tablet (5 mg total) by mouth daily. 03/27/22  Yes Bruning, Ashlyn, PA-C  latanoprost (XALATAN) 0.005 % ophthalmic solution Place 1 drop into both eyes at bedtime. 03/14/22  Yes [provider]  metoprolol succinate (TOPROL-XL) 25 MG 24 hr  tablet Take one tablet (25 mg dose) by mouth every 12 (twelve) hours. Please, hold Metoprolol if systolic BP is less than 222 or HR is less than 60. Patient taking differently: Take 25 mg by mouth every 12 (twelve) hours. Holf if systolic BP is less than 979 or HR is less than 60. 04/10/22  Yes   ranolazine (RANEXA) 500 MG 12 hr tablet Take 1 tablet (500 mg total) by mouth 2 (two) times daily. 02/22/22  Yes   tamsulosin (FLOMAX) 0.4 MG CAPS capsule Take 2 capsules (0.8 mg total) by mouth daily. 03/27/22  Yes Bruning, Ashlyn, PA-C  timolol (TIMOPTIC) 0.5 % ophthalmic solution Place 1 drop into both eyes 2 (two) times daily. 03/14/22  Yes [provider]  valsartan (DIOVAN) 80 MG tablet Take 1 tablet (80 mg total) by mouth daily. 02/23/22  Yes   ergocalciferol (VITAMIN D2) 1.25 MG (50000 UT) capsule Take 1 capsule (50,000 Units total) by mouth once a week. Patient not taking: Reported on 04/16/2022 02/23/22   Kristie Cowman, MD  erythromycin ophthalmic ointment apply 2 times a day to affected eye(s) as directed Patient not taking: Reported on 04/16/2022 03/14/22     famotidine (PEPCID) 20 MG tablet Take 1 tablet (20 mg total) by mouth daily. Patient not taking: Reported on 04/16/2022 02/23/22     levofloxacin (LEVAQUIN) 750 MG tablet Take 1 tablet by mouth on the morning of procedure. Patient not taking: Reported on 04/16/2022 02/02/22       Physical Exam: Vitals:   04/16/22 1730 04/16/22 1745 04/16/22 1800 04/16/22 1830  BP: (!) 108/59  116/69 105/60  Pulse: 71 72 73 72  Resp:    20  Temp:      TempSrc:      SpO2: 98% 100% 96% 95%  Weight:      Height:       Constitutional: Frail, chronically ill looking NAD, calm, comfortable Eyes: Corneal blurring with palpebral inflammation ENMT: Mucous membranes are moist. Posterior pharynx clear of any exudate or lesions.Normal dentition.  Neck: normal, supple, no masses, no thyromegaly Respiratory: Decreased air entry bilaterally, coarse breath  sound, diffuse rhonchi, no wheezing, no crackles. Normal respiratory effort. No accessory muscle use.  Cardiovascular: Sinus tachycardia, no murmurs / rubs / gallops. No extremity edema. 2+ pedal pulses. No carotid bruits.  Abdomen: no tenderness, no masses palpated. No hepatosplenomegaly. Bowel sounds positive.  Musculoskeletal: Good range of motion, no joint swelling or tenderness, Skin: no rashes, lesions, ulcers. No induration Neurologic: CN 2-12 grossly intact. Sensation intact, DTR normal. Strength 5/5 in all 4.  Psychiatric: Normal judgment and insight. Alert and oriented x 3. Normal mood  Data Reviewed:  Temperature 99.5, blood pressure 93/42, pulse 76, respirate 23, oxygen sats 92% on room air.  White count 13.6 hemoglobin 11.9 platelets 217.  Acute viral screen negative for influenza, negative for COVID-19.  EKG showed normal sinus rhythm.  Chest x-ray showed ill-defined opacity within the left lung base suspicious for pneumonia.  Subtle ill-defined opacity also present within the right lung base.  Assessment and Plan:  #1 community-acquired pneumonia: Patient will be admitted.  Initiate IV Rocephin and azithromycin.  Blood cultures obtained.  Sputum cultures obtained.  Oxygenation as needed.  Follow culture results and adjust medication as necessary.  #2 history of prostate cancer: Stable.  Continue home regimen.  #3 COPD with mild exacerbation: Continue antibiotics steroids and breathing treatment.  #4 essential hypertension: Continue home regimen.  #5 hyperlipidemia: Continue with statin  #6 GERD: Continue PPIs  #7 coronary artery disease: Stable.  Continue to monitor  #8 degenerative joint disease: As needed pain medications.  Supportive care    Advance Care Planning:   Code Status: Prior full code  Consults: None  Family Communication: Daughter and grandson at bedside  Severity of Illness: The appropriate patient status for this patient is INPATIENT. Inpatient  status is judged to be reasonable and necessary in order to provide the required intensity of service to ensure the patient's safety. The patient's presenting symptoms, physical exam findings, and initial radiographic and laboratory data in the context of their chronic comorbidities is felt to place them at high risk for further clinical deterioration. Furthermore, it is not anticipated that the patient will be medically stable for discharge from the hospital within 2 midnights of admission.   * I certify that at the point of admission it is my clinical judgment that the patient will require inpatient hospital care spanning beyond 2 midnights from the point of admission due to high intensity of service, high risk for further deterioration and high frequency of surveillance required.*  AuthorBarbette Merino, MD 04/16/2022 7:19 PM  For on call review [email or web address].

## 2022-04-17 ENCOUNTER — Inpatient Hospital Stay (HOSPITAL_COMMUNITY): Payer: Medicare Other

## 2022-04-17 DIAGNOSIS — J441 Chronic obstructive pulmonary disease with (acute) exacerbation: Secondary | ICD-10-CM

## 2022-04-17 DIAGNOSIS — I2510 Atherosclerotic heart disease of native coronary artery without angina pectoris: Secondary | ICD-10-CM | POA: Diagnosis not present

## 2022-04-17 DIAGNOSIS — J45901 Unspecified asthma with (acute) exacerbation: Secondary | ICD-10-CM

## 2022-04-17 DIAGNOSIS — H409 Unspecified glaucoma: Secondary | ICD-10-CM

## 2022-04-17 DIAGNOSIS — E782 Mixed hyperlipidemia: Secondary | ICD-10-CM | POA: Diagnosis not present

## 2022-04-17 DIAGNOSIS — N179 Acute kidney failure, unspecified: Secondary | ICD-10-CM

## 2022-04-17 DIAGNOSIS — I480 Paroxysmal atrial fibrillation: Secondary | ICD-10-CM | POA: Diagnosis present

## 2022-04-17 DIAGNOSIS — C61 Malignant neoplasm of prostate: Secondary | ICD-10-CM

## 2022-04-17 DIAGNOSIS — K219 Gastro-esophageal reflux disease without esophagitis: Secondary | ICD-10-CM

## 2022-04-17 DIAGNOSIS — J189 Pneumonia, unspecified organism: Secondary | ICD-10-CM | POA: Diagnosis not present

## 2022-04-17 LAB — URINALYSIS, ROUTINE W REFLEX MICROSCOPIC
Bilirubin Urine: NEGATIVE
Glucose, UA: NEGATIVE mg/dL
Hgb urine dipstick: NEGATIVE
Ketones, ur: NEGATIVE mg/dL
Leukocytes,Ua: NEGATIVE
Nitrite: NEGATIVE
Protein, ur: NEGATIVE mg/dL
Specific Gravity, Urine: 1.021 (ref 1.005–1.030)
pH: 6 (ref 5.0–8.0)

## 2022-04-17 LAB — COMPREHENSIVE METABOLIC PANEL
ALT: 12 U/L (ref 0–44)
AST: 13 U/L — ABNORMAL LOW (ref 15–41)
Albumin: 3.1 g/dL — ABNORMAL LOW (ref 3.5–5.0)
Alkaline Phosphatase: 46 U/L (ref 38–126)
Anion gap: 7 (ref 5–15)
BUN: 32 mg/dL — ABNORMAL HIGH (ref 8–23)
CO2: 24 mmol/L (ref 22–32)
Calcium: 8.5 mg/dL — ABNORMAL LOW (ref 8.9–10.3)
Chloride: 105 mmol/L (ref 98–111)
Creatinine, Ser: 1.81 mg/dL — ABNORMAL HIGH (ref 0.61–1.24)
GFR, Estimated: 38 mL/min — ABNORMAL LOW (ref >60)
Glucose, Bld: 113 mg/dL — ABNORMAL HIGH (ref 70–99)
Potassium: 4 mmol/L (ref 3.5–5.1)
Sodium: 136 mmol/L (ref 135–145)
Total Bilirubin: 1.2 mg/dL (ref 0.3–1.2)
Total Protein: 6.1 g/dL — ABNORMAL LOW (ref 6.5–8.1)

## 2022-04-17 LAB — CBC
HCT: 35.3 % — ABNORMAL LOW (ref 39.0–52.0)
Hemoglobin: 11.3 g/dL — ABNORMAL LOW (ref 13.0–17.0)
MCH: 28.2 pg (ref 26.0–34.0)
MCHC: 32 g/dL (ref 30.0–36.0)
MCV: 88 fL (ref 80.0–100.0)
Platelets: 170 10*3/uL (ref 150–400)
RBC: 4.01 MIL/uL — ABNORMAL LOW (ref 4.22–5.81)
RDW: 13.8 % (ref 11.5–15.5)
WBC: 8.7 10*3/uL (ref 4.0–10.5)
nRBC: 0 % (ref 0.0–0.2)

## 2022-04-17 LAB — HIV ANTIBODY (ROUTINE TESTING W REFLEX): HIV Screen 4th Generation wRfx: NONREACTIVE

## 2022-04-17 LAB — SODIUM, URINE, RANDOM: Sodium, Ur: 70 mmol/L

## 2022-04-17 LAB — CREATININE, URINE, RANDOM: Creatinine, Urine: 177 mg/dL

## 2022-04-17 MED ORDER — ZOLPIDEM TARTRATE 5 MG PO TABS
5.0000 mg | ORAL_TABLET | Freq: Once | ORAL | Status: AC
  Administered 2022-04-17: 5 mg via ORAL
  Filled 2022-04-17: qty 1

## 2022-04-17 MED ORDER — LATANOPROST 0.005 % OP SOLN
1.0000 [drp] | Freq: Every day | OPHTHALMIC | Status: DC
  Administered 2022-04-17: 1 [drp] via OPHTHALMIC
  Filled 2022-04-17: qty 2.5

## 2022-04-17 MED ORDER — RANOLAZINE ER 500 MG PO TB12
500.0000 mg | ORAL_TABLET | Freq: Two times a day (BID) | ORAL | Status: DC
  Administered 2022-04-17 – 2022-04-18 (×3): 500 mg via ORAL
  Filled 2022-04-17 (×4): qty 1

## 2022-04-17 MED ORDER — ATORVASTATIN CALCIUM 40 MG PO TABS
40.0000 mg | ORAL_TABLET | Freq: Every day | ORAL | Status: DC
  Administered 2022-04-17 – 2022-04-18 (×2): 40 mg via ORAL
  Filled 2022-04-17 (×2): qty 1

## 2022-04-17 MED ORDER — ACETAMINOPHEN 500 MG PO TABS
500.0000 mg | ORAL_TABLET | Freq: Four times a day (QID) | ORAL | Status: DC | PRN
  Administered 2022-04-17: 500 mg via ORAL
  Filled 2022-04-17: qty 1

## 2022-04-17 MED ORDER — FINASTERIDE 5 MG PO TABS
5.0000 mg | ORAL_TABLET | Freq: Every day | ORAL | Status: DC
  Administered 2022-04-17 – 2022-04-18 (×2): 5 mg via ORAL
  Filled 2022-04-17 (×2): qty 1

## 2022-04-17 MED ORDER — TIMOLOL MALEATE 0.5 % OP SOLN
1.0000 [drp] | Freq: Two times a day (BID) | OPHTHALMIC | Status: DC
  Administered 2022-04-17 – 2022-04-18 (×3): 1 [drp] via OPHTHALMIC
  Filled 2022-04-17: qty 5

## 2022-04-17 MED ORDER — IPRATROPIUM-ALBUTEROL 0.5-2.5 (3) MG/3ML IN SOLN
3.0000 mL | Freq: Four times a day (QID) | RESPIRATORY_TRACT | Status: DC
  Administered 2022-04-17 (×3): 3 mL via RESPIRATORY_TRACT
  Filled 2022-04-17 (×3): qty 3

## 2022-04-17 MED ORDER — PREDNISONE 50 MG PO TABS
50.0000 mg | ORAL_TABLET | Freq: Every day | ORAL | Status: DC
  Administered 2022-04-17 – 2022-04-18 (×2): 50 mg via ORAL
  Filled 2022-04-17 (×2): qty 1

## 2022-04-17 MED ORDER — ATROPINE SULFATE 1 % OP SOLN
1.0000 [drp] | Freq: Two times a day (BID) | OPHTHALMIC | Status: DC
  Administered 2022-04-17 – 2022-04-18 (×3): 1 [drp] via OPHTHALMIC
  Filled 2022-04-17: qty 2

## 2022-04-17 MED ORDER — HYDRALAZINE HCL 20 MG/ML IJ SOLN: 10.0000 mg | Freq: Four times a day (QID) | INTRAMUSCULAR | Status: DC | PRN

## 2022-04-17 MED ORDER — IPRATROPIUM-ALBUTEROL 0.5-2.5 (3) MG/3ML IN SOLN
3.0000 mL | Freq: Three times a day (TID) | RESPIRATORY_TRACT | Status: DC
  Administered 2022-04-18 (×2): 3 mL via RESPIRATORY_TRACT
  Filled 2022-04-17 (×2): qty 3

## 2022-04-17 MED ORDER — TAMSULOSIN HCL 0.4 MG PO CAPS
0.8000 mg | ORAL_CAPSULE | Freq: Every day | ORAL | Status: DC
  Administered 2022-04-17 – 2022-04-18 (×2): 0.8 mg via ORAL
  Filled 2022-04-17 (×2): qty 2

## 2022-04-17 MED ORDER — PANTOPRAZOLE SODIUM 40 MG PO TBEC
40.0000 mg | DELAYED_RELEASE_TABLET | Freq: Every day | ORAL | Status: DC
  Administered 2022-04-18: 40 mg via ORAL
  Filled 2022-04-17: qty 1

## 2022-04-17 MED ORDER — METOPROLOL SUCCINATE ER 25 MG PO TB24
25.0000 mg | ORAL_TABLET | Freq: Two times a day (BID) | ORAL | Status: DC
  Administered 2022-04-17 – 2022-04-18 (×3): 25 mg via ORAL
  Filled 2022-04-17 (×3): qty 1

## 2022-04-17 MED ORDER — IPRATROPIUM-ALBUTEROL 0.5-2.5 (3) MG/3ML IN SOLN: 3.0000 mL | RESPIRATORY_TRACT | Status: DC | PRN

## 2022-04-17 MED ORDER — DORZOLAMIDE HCL 2 % OP SOLN
1.0000 [drp] | Freq: Three times a day (TID) | OPHTHALMIC | Status: DC
  Administered 2022-04-17 – 2022-04-18 (×5): 1 [drp] via OPHTHALMIC
  Filled 2022-04-17: qty 10

## 2022-04-17 NOTE — Progress Notes (Signed)
PROGRESS NOTE   Bryan Sullivan  JOA:[id number] DOB: 1945-07-09 DOA: 04/16/2022 PCP: [facility]   Date of Service: the patient was seen and examined on 04/17/2022  Brief Narrative:  76 year old male with past medical history of paroxysmal atrial fibrillation (on Eliquis), prostate cancer (actively undergoing radiation therapy with Dr. Tammi Klippel), mild coronary artery disease (seen on coronary CTA),  COPD, hypertension, hyperlipidemia, gastroesophageal reflux disease, glaucoma who presents to [facility] emergency department with complaints of chest pain and shortness of breath as well as productive cough.  Upon evaluation in the emergency department patient was found to have a left basilar infiltrate concerning for developing pneumonia.  Patient was also felt to be suffering clinically from associated COPD exacerbation.  Hospitalist group was then called and patient was admitted to the hospital service.  Patient was initiated on intravenous antibiotics with ceftriaxone and azithromycin.  Patient was additionally provided with systemic steroids and aggressive bronchodilator therapy for concurrent COPD exacerbation.    Assessment and Plan: Problem  Pneumonia of Left Lower Lobe Due to Infectious Organism  Continue ceftriaxone with azithromycin Supplemental oxygen for bouts of hypoxia  Bronchodilator therapy and systemic steroids for concurrent COPD exacerbation  Follow blood cultures Patient to obtain CT imaging of the chest and 3 weeks or so to ensure resolution of infiltrates   Acute Exacerbation of Copd With Asthma (Hcc)  Clinically patient is wheezing with prolonged expiratory phase concerning for COPD exacerbation Still wheezing this morning, placing patient on scheduled bronchodilator therapy Placing patient on present 50 mg by mouth once daily Remainder of assessment and plan as above  Acute kidney injury  Likely prerenal injury in the setting of  pneumonia and COPD exacerbation Will obtain postvoid residual bladder scan to ensure patient does not have a postobstructive uropathy in the setting of prostate cancer Hydrating patient with intravenous isotonic fluids Strict input and output monitoring Monitoring renal function and electrolytes with serial chemistries   Paroxysmal Atrial Fibrillation (Hcc)  Currently going in and out of atrial fibrillation/flutter Monitoring on telemetry for now Continue Eliquis Continue metoprolol  Essential hypertension  Continue metoprolol As needed intravenous antihypertensives for markedly elevated blood pressures.     Mixed Hyperlipidemia  Continuing home regimen of lipid lowering therapy.   Coronary Artery Disease Involving Native Coronary Artery of Native Heart Without Angina Pectoris  Patient is currently chest pain free Monitoring patient on telemetry Continue home regimen of antiplatelet therapy, lipid lowering therapy and AV nodal blocking therapy   Malignant Neoplasm of Prostate (Hcc)  Outpatient follow-up with urology CT imaging the chest in 3 weeks to ensure that pneumonia has resolved and this is not metastatic disease Continue Flomax Continue Proscar   Gastroesophageal Reflux Disease  Protonix 40 mg by mouth once daily  Glaucoma (Increased Eye Pressure)  Continue home regimen of eyedrops        Subjective:  Patient continues to complain of generalized weakness.  Patient additionally complaining of some continued shortness of breath and cough with associated wheezing.  Patient denies associated chest pain.  Patient states that he feels better compared to yesterday  Physical Exam:  Vitals:   04/16/22 1955 04/16/22 2301 04/17/22 0029 04/17/22 0430  BP: (!) 143/71  (!) 106/51 (!) 103/58  Pulse: 73 76 (!) 101   Resp: '18 20 18 18  '$ Temp: 98.1 F (36.7 C)  100.1 F (37.8 C) 98.1 F (36.7 C)  TempSrc: Oral  Oral Oral  SpO2: 100% 93% 91% 93%  Weight:  84.3 kg   Height:        Constitutional: Awake alert and oriented x3, no associated distress.   Skin: no rashes, no lesions, good skin turgor noted. Eyes: Pupils are equally reactive to light.  No evidence of scleral icterus or conjunctival pallor.  ENMT: Moist mucous membranes noted.  Posterior pharynx clear of any exudate or lesions.   Respiratory: Notable expiratory wheezing with prolonged aspiratory phase.  Mild bibasilar rales.  Normal respiratory effort without accessory muscle use. Cardiovascular: Irregularly regular rate and rhythm.  No murmurs / rubs / gallops. No extremity edema. 2+ pedal pulses. No carotid bruits.  Abdomen: Abdomen is soft and nontender.  No evidence of intra-abdominal masses.  Positive bowel sounds noted in all quadrants.   Musculoskeletal: No joint deformity upper and lower extremities. Good ROM, no contractures. Normal muscle tone.    Data Reviewed:  I have personally reviewed and interpreted labs, imaging.  Significant findings are   CBC: Recent Labs  Lab 04/16/22 1225 04/17/22 0557  WBC 13.6* 8.7  HGB 11.9* 11.3*  HCT 37.6* 35.3*  MCV 87.2 88.0  PLT 217 621   Basic Metabolic Panel: Recent Labs  Lab 04/16/22 1225 04/17/22 0557  NA 135 136  K 4.4 4.0  CL 102 105  CO2 26 24  GLUCOSE 135* 113*  BUN 26* 32*  CREATININE 1.67* 1.81*  CALCIUM 8.7* 8.5*   GFR: Estimated Creatinine Clearance: 34.7 mL/min (A) (by C-G formula based on SCr of 1.81 mg/dL (H)). Liver Function Tests: Recent Labs  Lab 04/17/22 0557  AST 13*  ALT 12  ALKPHOS 46  BILITOT 1.2  PROT 6.1*  ALBUMIN 3.1*    Coagulation Profile: No results for input(s): "INR", "PROTIME" in the last 168 hours.   EKG/Telemetry: Personally reviewed.  Rhythm is atrial flutter with variable AV block with heart rate of 84 bpm.  No dynamic ST segment changes appreciated.   Code Status:  Full code.  Code status decision has been confirmed with: patient / son Family Communication: Son is at  bedside who has been updated on plan of care   Severity of Illness:  The appropriate patient status for this patient is INPATIENT. Inpatient status is judged to be reasonable and necessary in order to provide the required intensity of service to ensure the patient's safety. The patient's presenting symptoms, physical exam findings, and initial radiographic and laboratory data in the context of their chronic comorbidities is felt to place them at high risk for further clinical deterioration. Furthermore, it is not anticipated that the patient will be medically stable for discharge from the hospital within 2 midnights of admission.   * I certify that at the point of admission it is my clinical judgment that the patient will require inpatient hospital care spanning beyond 2 midnights from the point of admission due to high intensity of service, high risk for further deterioration and high frequency of surveillance required.*  Time spent:  51 minutes  Author:  Vernelle Emerald MD  04/17/2022 7:30 AM

## 2022-04-17 NOTE — Progress Notes (Signed)
    OVERNIGHT PROGRESS REPORT  Notified by RN for patient request of previous night medication for sleep aid. He states that "it worked well" with no reported after effects.   Reorder completed for ( 1 ) One dose Ambien.  Gershon Cull MSNA MSN ACN[facility] Acute Care Nurse Practitioner [place]

## 2022-04-17 NOTE — Progress Notes (Signed)
Mobility Specialist - Progress Note   04/17/22 0919  Mobility  Activity Ambulated independently in hallway  Level of Assistance Independent  Assistive Device None  Distance Ambulated (ft) 250 ft  Activity Response Tolerated well  Mobility Referral Yes  $Mobility charge 1 Mobility   Pt received in bed and agreeable to mobility. No complaints during mobility.  Ambulation cut short due to O2 dropping to 80%. Pt stated he felt fine even though O2 drop. Pt to bed after session with all needs met & family in room.   During mobility: 80% SpO2 Post-mobility: 96% SPO2  Set designer

## 2022-04-17 NOTE — Hospital Course (Addendum)
76 year old male with past medical history of paroxysmal atrial fibrillation (on Eliquis), prostate cancer (actively undergoing radiation therapy with Dr. Tammi Klippel), mild coronary artery disease (seen on coronary CTA),  COPD, hypertension, hyperlipidemia, gastroesophageal reflux disease, glaucoma who presents to [facility] emergency department with complaints of chest pain and shortness of breath as well as productive cough.  Upon evaluation in the emergency department patient was found to have a left basilar infiltrate concerning for developing pneumonia.  Patient was also felt to be suffering clinically from associated COPD exacerbation.  Hospitalist group was then called and patient was admitted to the hospital service.  Patient was initiated on intravenous antibiotics with ceftriaxone and azithromycin.  Patient was additionally provided with aggressive bronchodilator therapy for concurrent COPD exacerbation.

## 2022-04-18 DIAGNOSIS — I10 Essential (primary) hypertension: Secondary | ICD-10-CM

## 2022-04-18 DIAGNOSIS — H4010X0 Unspecified open-angle glaucoma, stage unspecified: Secondary | ICD-10-CM

## 2022-04-18 DIAGNOSIS — K2100 Gastro-esophageal reflux disease with esophagitis, without bleeding: Secondary | ICD-10-CM

## 2022-04-18 DIAGNOSIS — J4489 Other specified chronic obstructive pulmonary disease: Secondary | ICD-10-CM

## 2022-04-18 LAB — COMPREHENSIVE METABOLIC PANEL WITH GFR
ALT: 14 U/L (ref 0–44)
AST: 16 U/L (ref 15–41)
Albumin: 3.2 g/dL — ABNORMAL LOW (ref 3.5–5.0)
Alkaline Phosphatase: 57 U/L (ref 38–126)
Anion gap: 7 (ref 5–15)
BUN: 25 mg/dL — ABNORMAL HIGH (ref 8–23)
CO2: 22 mmol/L (ref 22–32)
Calcium: 8.9 mg/dL (ref 8.9–10.3)
Chloride: 110 mmol/L (ref 98–111)
Creatinine, Ser: 1.29 mg/dL — ABNORMAL HIGH (ref 0.61–1.24)
GFR, Estimated: 57 mL/min — ABNORMAL LOW
Glucose, Bld: 155 mg/dL — ABNORMAL HIGH (ref 70–99)
Potassium: 4.1 mmol/L (ref 3.5–5.1)
Sodium: 139 mmol/L (ref 135–145)
Total Bilirubin: 0.8 mg/dL (ref 0.3–1.2)
Total Protein: 6.9 g/dL (ref 6.5–8.1)

## 2022-04-18 LAB — CBC WITH DIFFERENTIAL/PLATELET
Abs Immature Granulocytes: 0.06 10*3/uL (ref 0.00–0.07)
Basophils Absolute: 0 10*3/uL (ref 0.0–0.1)
Basophils Relative: 0 %
Eosinophils Absolute: 0 10*3/uL (ref 0.0–0.5)
Eosinophils Relative: 0 %
HCT: 36.1 % — ABNORMAL LOW (ref 39.0–52.0)
Hemoglobin: 11.2 g/dL — ABNORMAL LOW (ref 13.0–17.0)
Immature Granulocytes: 1 %
Lymphocytes Relative: 8 %
Lymphs Abs: 0.7 10*3/uL (ref 0.7–4.0)
MCH: 27.7 pg (ref 26.0–34.0)
MCHC: 31 g/dL (ref 30.0–36.0)
MCV: 89.1 fL (ref 80.0–100.0)
Monocytes Absolute: 0.4 10*3/uL (ref 0.1–1.0)
Monocytes Relative: 4 %
Neutro Abs: 8.2 10*3/uL — ABNORMAL HIGH (ref 1.7–7.7)
Neutrophils Relative %: 87 %
Platelets: 166 10*3/uL (ref 150–400)
RBC: 4.05 MIL/uL — ABNORMAL LOW (ref 4.22–5.81)
RDW: 13.6 % (ref 11.5–15.5)
WBC: 9.3 10*3/uL (ref 4.0–10.5)
nRBC: 0 % (ref 0.0–0.2)

## 2022-04-18 LAB — UREA NITROGEN, URINE: Urea Nitrogen, Ur: 1198 mg/dL

## 2022-04-18 LAB — MAGNESIUM: Magnesium: 2.3 mg/dL (ref 1.7–2.4)

## 2022-04-18 LAB — URINE CULTURE: Culture: NO GROWTH

## 2022-04-18 MED ORDER — PREDNISONE 50 MG PO TABS
ORAL_TABLET | ORAL | 0 refills | Status: DC
  Filled 2022-04-18: qty 5, fill #0

## 2022-04-18 MED ORDER — CEFDINIR 300 MG PO CAPS
300.0000 mg | ORAL_CAPSULE | Freq: Two times a day (BID) | ORAL | 0 refills | Status: DC
  Filled 2022-04-18: qty 8, 4d supply, fill #0

## 2022-04-18 MED ORDER — CEFDINIR 300 MG PO CAPS: 300.0000 mg | ORAL_CAPSULE | Freq: Two times a day (BID) | ORAL | Status: DC

## 2022-04-18 MED ORDER — AZITHROMYCIN 250 MG PO TABS: 250.0000 mg | ORAL_TABLET | Freq: Every day | ORAL | Status: DC

## 2022-04-18 MED ORDER — AZITHROMYCIN 250 MG PO TABS: ORAL_TABLET | ORAL | 0 refills | Status: DC

## 2022-04-18 MED ORDER — PREDNISONE 50 MG PO TABS: ORAL_TABLET | ORAL | 0 refills | Status: DC

## 2022-04-18 MED ORDER — AZITHROMYCIN 250 MG PO TABS
ORAL_TABLET | ORAL | 0 refills | Status: DC
  Filled 2022-04-18: qty 4, fill #0

## 2022-04-18 MED ORDER — GUAIFENESIN-DM 100-10 MG/5ML PO SYRP: 5.0000 mL | ORAL_SOLUTION | ORAL | 0 refills | Status: DC | PRN

## 2022-04-18 MED ORDER — GUAIFENESIN-DM 100-10 MG/5ML PO SYRP
5.0000 mL | ORAL_SOLUTION | ORAL | 0 refills | Status: DC | PRN
  Filled 2022-04-18: qty 118, 4d supply, fill #0

## 2022-04-18 MED ORDER — PANTOPRAZOLE SODIUM 40 MG PO TBEC
40.0000 mg | DELAYED_RELEASE_TABLET | Freq: Every day | ORAL | 0 refills | Status: DC
  Filled 2022-04-18: qty 7, 7d supply, fill #0

## 2022-04-18 MED ORDER — CEFDINIR 300 MG PO CAPS: 300.0000 mg | ORAL_CAPSULE | Freq: Two times a day (BID) | ORAL | 0 refills | Status: DC

## 2022-04-18 MED ORDER — PANTOPRAZOLE SODIUM 40 MG PO TBEC: 40.0000 mg | DELAYED_RELEASE_TABLET | Freq: Every day | ORAL | 0 refills | Status: DC

## 2022-04-18 MED ORDER — SODIUM CHLORIDE 0.9 % IV SOLN: INTRAVENOUS | Status: DC

## 2022-04-18 MED ORDER — HYDROCODONE-ACETAMINOPHEN 5-325 MG PO TABS: 1.0000 | ORAL_TABLET | Freq: Four times a day (QID) | ORAL | 0 refills | Status: DC | PRN

## 2022-04-18 MED ORDER — ALBUTEROL SULFATE (2.5 MG/3ML) 0.083% IN NEBU: 2.5000 mg | INHALATION_SOLUTION | Freq: Four times a day (QID) | RESPIRATORY_TRACT | 0 refills | Status: DC | PRN

## 2022-04-18 MED ORDER — IPRATROPIUM-ALBUTEROL 20-100 MCG/ACT IN AERS: 1.0000 | INHALATION_SPRAY | Freq: Four times a day (QID) | RESPIRATORY_TRACT | Status: DC

## 2022-04-18 NOTE — Discharge Summary (Signed)
Physician Discharge Summary  Bryan Sullivan ATF:[id number] DOB: 1945/11/01 DOA: 04/16/2022  PCP: [facility]  Admit date: 04/16/2022 Discharge date: 04/18/2022  Time spent: 35 minutes  Recommendations for Outpatient Follow-up:  LEFT lower lobe pneumonia due to infectious organism - Complete 5-day course antibiotics -Combivent - Incentive spirometry - Flutter valve - Obtain CT imaging of his chest in 3 weeks to ensure resolution of infiltrates  Acute exacerbation of COPD  -See pneumonia see  AKI (baseline Cr 1.01)  Likely prerenal injury in the setting of pneumonia and COPD exacerbation Will obtain postvoid residual bladder scan to ensure patient does not have a postobstructive uropathy in the setting of prostate cancer Hydrating patient with intravenous isotonic fluids Strict input and output monitoring Monitoring renal function and electrolytes with serial chemistries Lab Results  Component Value Date   CREATININE 1.29 (H) 04/18/2022   CREATININE 1.81 (H) 04/17/2022   CREATININE 1.67 (H) 04/16/2022   CREATININE 1.01 02/16/2022   CREATININE 1.38 (H) 04/19/2021  Trending down but not back to baseline.  PCP to monitor as outpatient  Paroxysmal atrial fibrillation - Eliquis 5 mg BID - Metoprolol 25 mg BID - Currently NSR  CAD - Patient currently pain-free. - See paroxysmal atrial fibrillation and HLD   Mixed HLD -Lipitor 40 mg daily  Malignant neoplasm of prostate - Follow-up urology as outpatient - CT imaging of the chest in 3 weeks to ensure that pneumonia has resolved and that this is not metastatic disease - Flomax - Proscar  GERD - Protonix 40 mg daily  Glaucoma (increased eye pressure) - Continue home regimen    Discharge Diagnoses:  Principal Problem:   Pneumonia of left lower lobe due to infectious organism Active Problems:   Acute exacerbation of COPD with asthma ([place])   AKI (acute kidney injury) ([place])   Paroxysmal atrial  fibrillation ([place])   Essential hypertension   Mixed hyperlipidemia   Coronary artery disease involving native coronary artery of native heart without angina pectoris   Malignant neoplasm of prostate ([place])   Gastroesophageal reflux disease   Glaucoma (increased eye pressure)   Discharge Condition: Stable   Filed Weights   04/16/22 1136 04/17/22 0430  Weight: 82.6 kg 84.3 kg    History of present illness:  76 year old male with past medical history of paroxysmal atrial fibrillation (on Eliquis), prostate cancer (actively undergoing radiation therapy with Dr. Tammi Klippel), mild coronary artery disease (seen on coronary CTA),  COPD, hypertension, hyperlipidemia, gastroesophageal reflux disease, glaucoma who presents to [facility] emergency department with complaints of chest pain and shortness of breath as well as productive cough.   Upon evaluation in the emergency department patient was found to have a left basilar infiltrate concerning for developing pneumonia.  Patient was also felt to be suffering clinically from associated COPD exacerbation.  Hospitalist group was then called and patient was admitted to the hospital service.   Patient was initiated on intravenous antibiotics with ceftriaxone and azithromycin.  Patient was additionally provided with systemic steroids and aggressive bronchodilator therapy for concurrent COPD exacerbation.    Hospital Course:  See above    Discharge Exam: Vitals:   04/18/22 0552 04/18/22 0742 04/18/22 1428 04/18/22 1436  BP: (!) 143/75  132/72   Pulse: 82  83   Resp: 18  19   Temp: 98.1 F (36.7 C)  98.2 F (36.8 C)   TempSrc: Oral  Oral   SpO2: 100% 100% 100% 100%  Weight:      Height:  Physical Exam:  General: A/O x4 No acute respiratory distress Eyes: negative scleral hemorrhage, negative anisocoria, negative icterus ENT: Negative Runny nose, negative gingival bleeding, Neck:  Negative scars, masses, torticollis,  lymphadenopathy, JVD Lungs: decreased breath sounds, positive diffuse expiratory wheeze, negative  crackles   Discharge Instructions   Allergies as of 04/18/2022   No Known Allergies      Medication List     STOP taking these medications    amLODipine 2.5 MG tablet Commonly known as: NORVASC   levofloxacin 750 MG tablet Commonly known as: LEVAQUIN   valsartan 80 MG tablet Commonly known as: DIOVAN       TAKE these medications    acetaminophen 500 MG tablet Commonly known as: TYLENOL Take 500 mg by mouth as needed for moderate pain.   albuterol (2.5 MG/3ML) 0.083% nebulizer solution Commonly known as: PROVENTIL Take 3 mLs (2.5 mg total) by nebulization every 6 (six) hours as needed for wheezing or shortness of breath. What changed: Another medication with the same name was changed. Make sure you understand how and when to take each.   Ventolin HFA 108 (90 Base) MCG/ACT inhaler Generic drug: albuterol Inhale 2 puffs into the lungs every 4 (four) hours as needed. What changed: reasons to take this   atorvastatin 40 MG tablet Commonly known as: LIPITOR Take 1 tablet (40 mg total) by mouth daily.   atropine 1 % ophthalmic solution Place 1 drop into the right eye 2 (two) times daily.   azithromycin 250 MG tablet Commonly known as: ZITHROMAX 1 tablet p.o. daily   Breztri Aerosphere 160-9-4.8 MCG/ACT Aero Generic drug: Budeson-Glycopyrrol-Formoterol Inhale 2 puffs into the lungs in the morning and at bedtime.   cefdinir 300 MG capsule Commonly known as: OMNICEF Take 1 capsule (300 mg total) by mouth every 12 (twelve) hours.   dorzolamide 2 % ophthalmic solution Commonly known as: TRUSOPT Place 1 drop into the right eye 3 (three) times daily   Eliquis 5 MG Tabs tablet Generic drug: apixaban Take 1 tablet (5 mg total) by mouth 2 (two) times daily.   finasteride 5 MG tablet Commonly known as: Proscar Take 1 tablet (5 mg total) by mouth daily.    guaiFENesin-dextromethorphan 100-10 MG/5ML syrup Commonly known as: ROBITUSSIN DM Take 5 mLs by mouth every 4 (four) hours as needed for cough.   HYDROcodone-acetaminophen 5-325 MG tablet Commonly known as: NORCO/VICODIN Take 1 tablet by mouth every 6 (six) hours as needed for moderate pain.   latanoprost 0.005 % ophthalmic solution Commonly known as: XALATAN Place 1 drop into both eyes at bedtime.   metoprolol succinate 25 MG 24 hr tablet Commonly known as: TOPROL-XL Take one tablet (25 mg dose) by mouth every 12 (twelve) hours. Please, hold Metoprolol if systolic BP is less than 400 or HR is less than 60. What changed:  how much to take when to take this additional instructions   pantoprazole 40 MG tablet Commonly known as: PROTONIX Take 1 tablet (40 mg total) by mouth daily. Start taking on: April 19, 2022   predniSONE 50 MG tablet Commonly known as: DELTASONE 1 tab p.o. daily Start taking on: April 19, 2022   ranolazine 500 MG 12 hr tablet Commonly known as: RANEXA Take 1 tablet (500 mg total) by mouth 2 (two) times daily.   tamsulosin 0.4 MG Caps capsule Commonly known as: FLOMAX Take 2 capsules (0.8 mg total) by mouth daily.   timolol 0.5 % ophthalmic solution Commonly known as: TIMOPTIC Place 1  drop into both eyes 2 (two) times daily.       No Known Allergies    The results of significant diagnostics from this hospitalization (including imaging, microbiology, ancillary and laboratory) are listed below for reference.    Significant Diagnostic Studies: US RENAL  Result Date: 04/17/2022 CLINICAL DATA:  Renal failure EXAM: RENAL / URINARY TRACT ULTRASOUND COMPLETE COMPARISON:  None Available. FINDINGS: Right Kidney: Renal measurements: 9.2 x 4.9 x 5.3 cm = volume: 123.1 mL. Echogenicity within normal limits. No mass or hydronephrosis visualized. Left Kidney: Renal measurements: 9.7 x 4.7 x 4.7 cm = volume: 110.3 mL. Echogenicity within normal limits.  No mass or hydronephrosis visualized. Bladder: Appears normal for degree of bladder distention. Prevoid volume calculated at 119 cc Other: None. IMPRESSION: No hydronephrosis.  Normal parenchymal echogenicity. Electronically Signed   By: Misty Stanley M.D.   On: 04/17/2022 08:27   DG Chest 2 View  Result Date: 04/16/2022 CLINICAL DATA:  Provided history: Chest pain. Additional history provided: Chest pain and shortness of breath since yesterday. Productive cough. History of asthma, hypertension and smoking. EXAM: CHEST - 2 VIEW COMPARISON:  Prior chest radiographs 02/16/2022 and earlier. CT 03/23/2022. FINDINGS: Heart size within normal limits. Ill-defined opacities within the left lung base. Subtle ill-defined opacity is also present within the lateral right lung base. No evidence of pleural effusion or pneumothorax. Degenerative changes of the spine. IMPRESSION: Ill-defined opacities within the left lung base, suspicious for pneumonia given the provided history. Followup PA and lateral chest radiographs are recommended in 3-4 weeks following a trial of antibiotic therapy to ensure resolution and exclude underlying malignancy. Subtle ill-defined opacities also present within the right lung base, which may reflect atelectasis or additional airspace disease. Electronically Signed   By: Kellie Simmering D.O.   On: 04/16/2022 12:23   NM PET (PSMA) SKULL TO MID THIGH  Result Date: 03/26/2022 CLINICAL DATA:  Prostate carcinoma with biochemical recurrence. PSA equal 5.5 EXAM: NUCLEAR MEDICINE PET SKULL BASE TO THIGH TECHNIQUE: 9.2 mCi F18 Piflufolastat (Pylarify) was injected intravenously. Full-ring PET imaging was performed from the skull base to thigh after the radiotracer. CT data was obtained and used for attenuation correction and anatomic localization. COMPARISON:  None Available. FINDINGS: NECK No radiotracer activity in neck lymph nodes. Incidental CT finding: None. CHEST No radiotracer accumulation within  mediastinal or hilar lymph nodes. No suspicious pulmonary nodules on the CT scan. Incidental CT finding: None. ABDOMEN/PELVIS Prostate: Focal activity in the anterior apex midline with SUV max equal 6.1. Lymph nodes: No abnormal radiotracer accumulation within pelvic or abdominal nodes. Liver: No evidence of liver metastasis. Incidental CT finding: Posterior LEFT bladder diverticulum. SKELETON No focal activity to suggest skeletal metastasis. No sclerotic lesions. IMPRESSION: 1. Primary prostate adenocarcinoma in the anterior apex. 2. No evidence metastatic adenopathy in the pelvis or periaortic retroperitoneum. 3. No evidence of visceral metastasis or skeletal metastasis. Electronically Signed   By: Suzy Bouchard M.D.   On: 03/26/2022 16:39    Microbiology: Recent Results (from the past 240 hour(s))  Resp Panel by RT-PCR (Flu A&B, Covid) Anterior Nasal Swab     Status: None   Collection Time: 04/16/22 12:15 PM   Specimen: Anterior Nasal Swab  Result Value Ref Range Status   SARS Coronavirus 2 by RT PCR NEGATIVE NEGATIVE Final    Comment: (NOTE) SARS-CoV-2 target nucleic acids are NOT DETECTED.  The SARS-CoV-2 RNA is generally detectable in upper respiratory specimens during the acute phase of infection. The lowest concentration  of SARS-CoV-2 viral copies this assay can detect is 138 copies/mL. A negative result does not preclude SARS-Cov-2 infection and should not be used as the sole basis for treatment or other patient management decisions. A negative result may occur with  improper specimen collection/handling, submission of specimen other than nasopharyngeal swab, presence of viral mutation(s) within the areas targeted by this assay, and inadequate number of viral copies(<138 copies/mL). A negative result must be combined with clinical observations, patient history, and epidemiological information. The expected result is Negative.  Fact Sheet for Patients:   EntrepreneurPulse.com.au  Fact Sheet for Healthcare Providers:  IncredibleEmployment.be  This test is no t yet approved or cleared by the [place] FDA and  has been authorized for detection and/or diagnosis of SARS-CoV-2 by FDA under an Emergency Use Authorization (EUA). This EUA will remain  in effect (meaning this test can be used) for the duration of the COVID-19 declaration under Section 564(b)(1) of the Act, 21 U.S.C.section 360bbb-3(b)(1), unless the authorization is terminated  or revoked sooner.       Influenza A by PCR NEGATIVE NEGATIVE Final   Influenza B by PCR NEGATIVE NEGATIVE Final    Comment: (NOTE) The Xpert Xpress SARS-CoV-2/FLU/RSV plus assay is intended as an aid in the diagnosis of influenza from Nasopharyngeal swab specimens and should not be used as a sole basis for treatment. Nasal washings and aspirates are unacceptable for Xpert Xpress SARS-CoV-2/FLU/RSV testing.  Fact Sheet for Patients: EntrepreneurPulse.com.au  Fact Sheet for Healthcare Providers: IncredibleEmployment.be  This test is not yet approved or cleared by the [place] FDA and has been authorized for detection and/or diagnosis of SARS-CoV-2 by FDA under an Emergency Use Authorization (EUA). This EUA will remain in effect (meaning this test can be used) for the duration of the COVID-19 declaration under Section 564(b)(1) of the Act, 21 U.S.C. section 360bbb-3(b)(1), unless the authorization is terminated or revoked.  Performed at [facility], [place] [address] [place], [place] [address]   Culture, blood (single)     Status: None (Preliminary result)   Collection Time: 04/16/22  6:10 PM   Specimen: BLOOD  Result Value Ref Range Status   Specimen Description   Final    BLOOD BLOOD RIGHT ARM Performed at [place] [address], [place], [place] [address]     Special Requests   Final    BOTTLES DRAWN AEROBIC AND ANAEROBIC Blood Culture adequate volume Performed at [place] [address], [place], [place] [address]    Culture   Final    NO GROWTH 2 DAYS Performed at [place] [address] [place], [place] [address]    Report Status PENDING  Incomplete  Urine Culture     Status: None   Collection Time: 04/17/22  7:33 AM   Specimen: Urine, Clean Catch  Result Value Ref Range Status   Specimen Description   Final    URINE, CLEAN CATCH Performed at [facility], [place] [address], [place], [place] [address]    Special Requests   Final    NONE Performed at [facility], [place] [address], [place], [place] [address]    Culture   Final    NO GROWTH Performed at [facility], [place] [address] [place], [place] [address]    Report Status 04/18/2022 FINAL  Final  Culture, blood (Routine X 2) w Reflex to ID Panel     Status: None (Preliminary  result)   Collection Time: 04/17/22 10:12 AM   Specimen: BLOOD  Result Value Ref Range Status   Specimen Description   Final    BLOOD BLOOD RIGHT HAND Performed at [place] [address], [place], [place] [address]    Special Requests   Final    BOTTLES DRAWN AEROBIC ONLY Blood Culture adequate volume Performed at [place] [address] [place], [place] [address]    Culture   Final    NO GROWTH < 24 HOURS Performed at [place] [address] [place], [place] [address]    Report Status PENDING  Incomplete  Culture, blood (Routine X 2) w Reflex to ID Panel     Status: None (Preliminary result)   Collection Time: 04/17/22 10:16 AM   Specimen: BLOOD  Result Value Ref Range Status   Specimen Description   Final    BLOOD BLOOD LEFT HAND Performed at [place] [address], [place], [place] [address]    Special Requests   Final    BOTTLES  DRAWN AEROBIC ONLY Blood Culture adequate volume Performed at [place] [address] [place], [place] [address]    Culture   Final    NO GROWTH < 24 HOURS Performed at [place] [address] [place], [place] [address]    Report Status PENDING  Incomplete     Labs: Basic Metabolic Panel: Recent Labs  Lab 04/16/22 1225 04/17/22 0557 04/18/22 0559  NA 135 136 139  K 4.4 4.0 4.1  CL 102 105 110  CO2 '26 24 22  '$ GLUCOSE 135* 113* 155*  BUN 26* 32* 25*  CREATININE 1.67* 1.81* 1.29*  CALCIUM 8.7* 8.5* 8.9  MG  --   --  2.3   Liver Function Tests: Recent Labs  Lab 04/17/22 0557 04/18/22 0559  AST 13* 16  ALT 12 14  ALKPHOS 46 57  BILITOT 1.2 0.8  PROT 6.1* 6.9  ALBUMIN 3.1* 3.2*   No results for input(s): "LIPASE", "AMYLASE" in the last 168 hours. No results for input(s): "AMMONIA" in the last 168 hours. CBC: Recent Labs  Lab 04/16/22 1225 04/17/22 0557 04/18/22 0559  WBC 13.6* 8.7 9.3  NEUTROABS  --   --  8.2*  HGB 11.9* 11.3* 11.2*  HCT 37.6* 35.3* 36.1*  MCV 87.2 88.0 89.1  PLT 217 170 166   Cardiac Enzymes: No results for input(s): "CKTOTAL", "CKMB", "CKMBINDEX", "TROPONINI" in the last 168 hours. BNP: BNP (last 3 results) No results for input(s): "BNP" in the last 8760 hours.  ProBNP (last 3 results) No results for input(s): "PROBNP" in the last 8760 hours.  CBG: No results for input(s): "GLUCAP" in the last 168 hours.     Signed:  Dia Crawford, MD [facility]

## 2022-04-18 NOTE — Progress Notes (Signed)
Patient discharged to home with family, discharge instructions reviewed with patient and son who verbalized understanding.

## 2022-04-18 NOTE — Progress Notes (Signed)
SATURATION QUALIFICATIONS: (This note is used to comply with regulatory documentation for home oxygen)  Patient Saturations on Room Air at Rest = 96%  Patient Saturations on Room Air while Ambulating = 94-95%  Patient Saturations on 0 Liters of oxygen while Ambulating = 0%  Please briefly explain why patient needs home oxygen:pt does not need home O2.

## 2022-04-18 NOTE — Evaluation (Signed)
Occupational Therapy Evaluation Patient Details Name: Bryan Sullivan MRN: [id number] DOB: [birth year] Today's Date: 04/18/2022   History of Present Illness Mr. Brickley is a 76 yr old male admitted to the hospital with shortness of breath and cough. He was found to have suspected PNA and mild COPD exacerbation. PMH: asthma, COPD, BPH, HTN, CAD, lumbar radiculopathy, glaucoma   Clinical Impression   The patient performed all assessed tasks without the need for assistance, including supine to sit, lower body dressing seated EOB, sit to stand, and ambulating in the hall. He appears to be at his baseline level of functioning for self-care management, and he is not presenting with functional deficits that warrant the need for further OT services. OT will sign off and recommend he return home with family at discharge.      Recommendations for follow up therapy are one component of a multi-disciplinary discharge planning process, led by the attending physician.  Recommendations may be updated based on patient status, additional functional criteria and insurance authorization.   Follow Up Recommendations  No OT follow up           Functional Status Assessment  Patient has not had a recent decline in their functional status  Equipment Recommendations  None recommended by OT           Mobility Bed Mobility Overal bed mobility: Independent      Transfers Overall transfer level: Independent            Balance     Sitting balance-Leahy Scale: Good       Standing balance-Leahy Scale: Good              ADL either performed or assessed with clinical judgement   ADL Overall ADL's : Independent            Vision   Additional Comments: Per his medical record, he has a history of glaucoma            Pertinent Vitals/Pain Pain Assessment Pain Location: he reported having chest discomfort     Hand Dominance Right   Extremity/Trunk Assessment Upper Extremity  Assessment Upper Extremity Assessment: Overall WFL for tasks assessed   Lower Extremity Assessment Lower Extremity Assessment: Overall WFL for tasks assessed       Communication Communication Communication: No difficulties   Cognition Arousal/Alertness: Awake/alert Behavior During Therapy: WFL for tasks assessed/performed Overall Cognitive Status: Within Functional Limits for tasks assessed          General Comments: Oriented x4, able to follow commands without difficulty         Home Living Family/patient expects to be discharged to:: Private residence Living Arrangements: Children Available Help at Discharge: Family (Son) Type of Home: House Home Access: Stairs to enter Technical brewer of Steps: 1   Home Layout: Two level;Able to live on main level with bedroom/bathroom               Home Equipment: None   Additional Comments: His bedroom and bathroom are on the main level of the home      Prior Functioning/Environment Prior Level of Function : Independent/Modified Independent             Mobility Comments: Independent with ambulation ADLs Comments: Independent with ADLs              OT Treatment/Interventions: Other (comment) (no further OT needs identified)    OT Goals(Current goals can be found in the care plan section)  N/A  OT  Frequency:  N/A       AM-PAC OT "6 Clicks" Daily Activity     Outcome Measure Help from another person eating meals?: None Help from another person taking care of personal grooming?: None Help from another person toileting, which includes using toliet, bedpan, or urinal?: None Help from another person bathing (including washing, rinsing, drying)?: None Help from another person to put on and taking off regular upper body clothing?: None Help from another person to put on and taking off regular lower body clothing?: None 6 Click Score: 24   End of Session Nurse Communication: Mobility status  Activity  Tolerance: Patient tolerated treatment well Patient left: in chair;with call bell/phone within reach;with family/visitor present  OT Visit Diagnosis: Muscle weakness (generalized) (M62.81)                Time: 0165-5374 OT Time Calculation (min): 16 min Charges:  OT General Charges $OT Visit: 1 Visit OT Evaluation $OT Eval Low Complexity: 1 Low    Amberli Ruegg L Samrat Hayward, OTR/L 04/18/2022, 12:08 PM

## 2022-04-18 NOTE — Evaluation (Signed)
Physical Therapy Evaluation Patient Details Name: Bryan Sullivan MRN: [id number] DOB: 1946/02/18 Today's Date: 04/18/2022  History of Present Illness  Bryan Sullivan is a 76 yr old male admitted to the hospital with shortness of breath and cough. He was found to have suspected PNA and mild COPD exacerbation. PMH: asthma, COPD, BPH, HTN, CAD, lumbar radiculopathy, glaucoma  Clinical Impression  Patient evaluated by Physical Therapy with no further acute PT needs identified. All education has been completed and the patient has no further questions.  Pt is very pleasant and cooperative,unsteady with higher level balance however able to self recover with mildly delayed reaction time. Has family assist if/as needed. SpO2=98-100% on RA  See below for any follow-up Physical Therapy or equipment needs. PT is signing off. Thank you for this referral.        Recommendations for follow up therapy are one component of a multi-disciplinary discharge planning process, led by the attending physician.  Recommendations may be updated based on patient status, additional functional criteria and insurance authorization.  Follow Up Recommendations No PT follow up      Assistance Recommended at Discharge PRN  Patient can return home with the following  Help with stairs or ramp for entrance;Assist for transportation    Equipment Recommendations None recommended by PT  Recommendations for Other Services       Functional Status Assessment Patient has not had a recent decline in their functional status     Precautions / Restrictions Precautions Precautions: None Restrictions Weight Bearing Restrictions: No      Mobility  Bed Mobility               General bed mobility comments: in recliner    Transfers Overall transfer level: Independent Equipment used: None                    Ambulation/Gait Ambulation/Gait assistance: Modified independent (Device/Increase time) Gait  Distance (Feet): 300 Feet Assistive device: None Gait Pattern/deviations: Step-through pattern, WFL(Within Functional Limits)       General Gait Details: overall WFL; SpO2= 98-100% on RA  Stairs            Wheelchair Mobility    Modified Rankin (Stroke Patients Only)       Balance     Sitting balance-Leahy Scale: Good       Standing balance-Leahy Scale: Fair Standing balance comment: Fair +; NT to mod challenges, unsteady with some higher level balance however able to recover, reactions with mild delay Single Leg Stance - Right Leg: 3 Single Leg Stance - Left Leg: 3         High level balance activites: Backward walking, Side stepping, Head turns, Sudden stops High Level Balance Comments: mildly unsteady with higher level balance, LOB  with backward walking pt able to self recover             Pertinent Vitals/Pain Pain Assessment Pain Assessment: No/denies pain    Home Living Family/patient expects to be discharged to:: Private residence Living Arrangements: Children Available Help at Discharge: Family Type of Home: House Home Access: Stairs to enter   Technical brewer of Steps: 1   Home Layout: Two level;Able to live on main level with bedroom/bathroom Home Equipment: None Additional Comments: His bedroom and bathroom are on the main level of the home    Prior Function Prior Level of Function : Independent/Modified Independent             Mobility Comments: Independent with ambulation  ADLs Comments: Independent with ADLs     Hand Dominance   Dominant Hand: Right    Extremity/Trunk Assessment   Upper Extremity Assessment Upper Extremity Assessment: Defer to OT evaluation;Overall [facility] for tasks assessed    Lower Extremity Assessment Lower Extremity Assessment: Overall WFL for tasks assessed       Communication   Communication: No difficulties  Cognition Arousal/Alertness: Awake/alert Behavior During Therapy: WFL for tasks  assessed/performed Overall Cognitive Status: Within Functional Limits for tasks assessed                                 General Comments: Oriented x4, able to follow commands without difficulty; family present, occasionally interjecting d/t language barrier (pt speaks some [place])        General Comments      Exercises     Assessment/Plan    PT Assessment Patient does not need any further PT services  PT Problem List         PT Treatment Interventions      PT Goals (Current goals can be found in the Care Plan section)  Acute Rehab PT Goals Patient Stated Goal: home soon PT Goal Formulation: All assessment and education complete, DC therapy    Frequency       Co-evaluation               AM-PAC PT "6 Clicks" Mobility  Outcome Measure Help needed turning from your back to your side while in a flat bed without using bedrails?: None Help needed moving from lying on your back to sitting on the side of a flat bed without using bedrails?: None Help needed moving to and from a bed to a chair (including a wheelchair)?: None Help needed standing up from a chair using your arms (e.g., wheelchair or bedside chair)?: None Help needed to walk in hospital room?: None Help needed climbing 3-5 steps with a railing? : A Little 6 Click Score: 23    End of Session Equipment Utilized During Treatment: Gait belt Activity Tolerance: Patient tolerated treatment well Patient left: in chair;with call bell/phone within reach;with chair alarm set;with family/visitor present   PT Visit Diagnosis: Unsteadiness on feet (R26.81)    Time: 1142-1150 PT Time Calculation (min) (ACUTE ONLY): 8 min   Charges:   PT Evaluation $PT Eval Low Complexity: [facility], PT  Acute Rehab Dept [facility]) [phone]  [facility] Weekend Pager [facility] only)  [phone]  04/18/2022   [facility] 04/18/2022, 12:33 PM

## 2022-04-18 NOTE — Progress Notes (Incomplete)
Bryan Sullivan GGE:[id number] DOB: 09/11/1945 DOA: 04/16/2022 PCP: [facility]   Subj: 76 year old male with past medical history of paroxysmal atrial fibrillation (on Eliquis), prostate cancer (actively undergoing radiation therapy with Dr. Tammi Klippel), mild coronary artery disease (seen on coronary CTA),  COPD, hypertension, hyperlipidemia, gastroesophageal reflux disease, glaucoma who presents to [facility] emergency department with complaints of chest pain and shortness of breath as well as productive cough.   Upon evaluation in the emergency department patient was found to have a left basilar infiltrate concerning for developing pneumonia.  Patient was also felt to be suffering clinically from associated COPD exacerbation.  Hospitalist group was then called and patient was admitted to the hospital service.   Patient was initiated on intravenous antibiotics with ceftriaxone and azithromycin.  Patient was additionally provided with systemic steroids and aggressive bronchodilator therapy for concurrent COPD exacerbation.   Obj: 11/22 afebrile overnight    Objective: VITAL SIGNS: Temp: 98.1 F (36.7 C) (11/22 0552) Temp Source: Oral (11/22 0552) BP: 143/75 (11/22 0552) Pulse Rate: 82 (11/22 0552) SPO2; FIO2:   Intake/Output Summary (Last 24 hours) at 04/18/2022 4650 Last data filed at 04/18/2022 0700 Gross per 24 hour  Intake 2559.8 ml  Output 1050 ml  Net 1509.8 ml     Exam: Physical Exam:  General: No acute respiratory distress Eyes: negative scleral hemorrhage, negative anisocoria, negative icterus*** ENT: Negative Runny nose, negative gingival bleeding,*** Neck:  Negative scars, masses, torticollis, lymphadenopathy, JVD*** Lungs: Clear to auscultation bilaterally without wheezes or crackles Cardiovascular: Regular rate and rhythm without murmur gallop or rub normal S1 and S2 Abdomen: negative abdominal pain, nondistended, positive soft, bowel  sounds, no rebound, no ascites, no appreciable mass Extremities: No significant cyanosis, clubbing, or edema bilateral lower extremities Skin: Negative rashes, lesions, ulcers*** Psychiatric:  Negative depression, negative anxiety, negative fatigue, negative mania *** Central nervous system:  Cranial nerves II through XII intact, tongue/uvula midline, all extremities muscle strength 5/5, sensation intact throughout, finger nose finger bilateral within normal limits, quick finger touch bilateral within normal limits, negative Romberg sign, heel to shin bilateral within normal limits, standing on 1 foot bilateral within normal limits, walking on tiptoes within normal limits, walking on heels within normal limits, negative dysarthria, negative expressive aphasia, negative receptive aphasia.***   .  Body mass index is 26.67 kg/m.  *** Mobility Assessment (last 72 hours)     Mobility Assessment     Row Name 04/17/22 1800 04/16/22 2000         Does patient have an order for bedrest or is patient medically unstable No - Continue assessment No - Continue assessment      What is the highest level of mobility based on the progressive mobility assessment? Level 5 (Walks with assist in room/hall) - Balance while stepping forward/back and can walk in room with assist - Complete Level 5 (Walks with assist in room/hall) - Balance while stepping forward/back and can walk in room with assist - Complete                 DVT prophylaxis: *** Code Status: *** Family Communication: *** Status is: Inpatient  {Inpatient:23812}  Dispo: The patient is from: {From:23814}              Anticipated d/c is to: {To:23815}              Anticipated d/c date is: {Days:23816}              Patient currently {Medically  stable:23817}    Procedures/Significant Events:    Consultants:  ***   Cultures ***  Antimicrobials: ***  A/P  Pneumonia of Left Lower Lobe Due to Infectious Organism    Continue ceftriaxone with azithromycin Supplemental oxygen for bouts of hypoxia  Bronchodilator therapy and systemic steroids for concurrent COPD exacerbation  Follow blood cultures Patient to obtain CT imaging of the chest and 3 weeks or so to ensure resolution of infiltrates    Acute Exacerbation of Copd With Asthma (Hcc)   Clinically patient is wheezing with prolonged expiratory phase concerning for COPD exacerbation Still wheezing this morning, placing patient on scheduled bronchodilator therapy Placing patient on present 50 mg by mouth once daily Remainder of assessment and plan as above   Acute kidney injury   Likely prerenal injury in the setting of pneumonia and COPD exacerbation Will obtain postvoid residual bladder scan to ensure patient does not have a postobstructive uropathy in the setting of prostate cancer Hydrating patient with intravenous isotonic fluids Strict input and output monitoring Monitoring renal function and electrolytes with serial chemistries Lab Results  Component Value Date   CREATININE 1.29 (H) 04/18/2022   CREATININE 1.81 (H) 04/17/2022   CREATININE 1.67 (H) 04/16/2022   CREATININE 1.01 02/16/2022   CREATININE 1.38 (H) 04/19/2021  ]    Paroxysmal Atrial Fibrillation (Hcc)   Currently going in and out of atrial fibrillation/flutter Monitoring on telemetry for now Continue Eliquis Continue metoprolol   Essential hypertension   Continue metoprolol As needed intravenous antihypertensives for markedly elevated blood pressures.       Mixed Hyperlipidemia   Continuing home regimen of lipid lowering therapy.    Coronary Artery Disease Involving Native Coronary Artery of Native Heart Without Angina Pectoris   Patient is currently chest pain free Monitoring patient on telemetry Continue home regimen of antiplatelet therapy, lipid lowering therapy and AV nodal blocking therapy    Malignant Neoplasm of Prostate (Hcc)   Outpatient follow-up  with urology CT imaging the chest in 3 weeks to ensure that pneumonia has resolved and this is not metastatic disease Continue Flomax Continue Proscar    Gastroesophageal Reflux Disease   Protonix 40 mg by mouth once daily  Glaucoma (Increased Eye Pressure)  Continue home regimen of eyedrops              Body mass index is 26.67 kg/m.         Care during the described time interval was provided by me .  I have reviewed this patient's available data, including medical history, events of note, physical examination, and all test results as part of my evaluation.

## 2022-04-18 NOTE — TOC Progression Note (Signed)
Transition of Care [facility]) - Progression Note    Patient Details  Name: Talik Casique MRN: [id number] Date of Birth: [birth year]  Transition of Care [facility]) CM/SW [place], RN Phone Number:[phone]  04/18/2022, 3:39 PM  Clinical Narrative:     Transition of Care (TOC) Screening Note   Patient Details  Name: Asaph Serena Date of Birth: Apr 12, 1946   Transition of Care [facility]) CM/SW Contact:    Angelita Ingles, RN Phone Number: 04/18/2022, 3:39 PM    Transition of Care Department [facility]) has reviewed patient and no TOC needs have been identified at this time. We will continue to monitor patient advancement through interdisciplinary progression rounds. If new patient transition needs arise, please place a TOC consult.          Expected Discharge Plan and Services                                                 Social Determinants of Health (SDOH) Interventions    Readmission Risk Interventions     No data to display

## 2022-04-20 ENCOUNTER — Other Ambulatory Visit: Payer: Self-pay

## 2022-04-21 LAB — CULTURE, BLOOD (SINGLE)
Culture: NO GROWTH
Special Requests: ADEQUATE

## 2022-04-22 LAB — CULTURE, BLOOD (ROUTINE X 2)
Culture: NO GROWTH
Culture: NO GROWTH
Special Requests: ADEQUATE
Special Requests: ADEQUATE

## 2022-04-23 DIAGNOSIS — R0982 Postnasal drip: Secondary | ICD-10-CM | POA: Diagnosis not present

## 2022-04-23 DIAGNOSIS — R591 Generalized enlarged lymph nodes: Secondary | ICD-10-CM | POA: Diagnosis not present

## 2022-04-23 DIAGNOSIS — R7303 Prediabetes: Secondary | ICD-10-CM | POA: Diagnosis not present

## 2022-04-23 DIAGNOSIS — E789 Disorder of lipoprotein metabolism, unspecified: Secondary | ICD-10-CM | POA: Diagnosis not present

## 2022-04-23 DIAGNOSIS — C61 Malignant neoplasm of prostate: Secondary | ICD-10-CM | POA: Diagnosis not present

## 2022-04-23 DIAGNOSIS — Z09 Encounter for follow-up examination after completed treatment for conditions other than malignant neoplasm: Secondary | ICD-10-CM | POA: Diagnosis not present

## 2022-04-23 DIAGNOSIS — Z87438 Personal history of other diseases of male genital organs: Secondary | ICD-10-CM | POA: Diagnosis not present

## 2022-04-23 DIAGNOSIS — K219 Gastro-esophageal reflux disease without esophagitis: Secondary | ICD-10-CM | POA: Diagnosis not present

## 2022-04-23 DIAGNOSIS — I2510 Atherosclerotic heart disease of native coronary artery without angina pectoris: Secondary | ICD-10-CM | POA: Diagnosis not present

## 2022-04-23 DIAGNOSIS — E559 Vitamin D deficiency, unspecified: Secondary | ICD-10-CM | POA: Diagnosis not present

## 2022-04-23 DIAGNOSIS — I10 Essential (primary) hypertension: Secondary | ICD-10-CM | POA: Diagnosis not present

## 2022-04-24 ENCOUNTER — Other Ambulatory Visit: Payer: Self-pay

## 2022-04-24 DIAGNOSIS — C61 Malignant neoplasm of prostate: Secondary | ICD-10-CM | POA: Diagnosis not present

## 2022-04-24 MED ORDER — FLUTICASONE PROPIONATE 50 MCG/ACT NA SUSP
NASAL | 5 refills | Status: DC
  Filled 2022-04-24: qty 16, 30d supply, fill #0

## 2022-04-30 ENCOUNTER — Other Ambulatory Visit: Payer: Self-pay

## 2022-05-07 DIAGNOSIS — R590 Localized enlarged lymph nodes: Secondary | ICD-10-CM | POA: Diagnosis not present

## 2022-05-07 DIAGNOSIS — R918 Other nonspecific abnormal finding of lung field: Secondary | ICD-10-CM | POA: Diagnosis not present

## 2022-05-14 ENCOUNTER — Other Ambulatory Visit: Payer: Self-pay

## 2022-05-15 ENCOUNTER — Other Ambulatory Visit: Payer: Self-pay

## 2022-05-15 DIAGNOSIS — R9431 Abnormal electrocardiogram [ECG] [EKG]: Secondary | ICD-10-CM | POA: Diagnosis not present

## 2022-05-15 DIAGNOSIS — I2510 Atherosclerotic heart disease of native coronary artery without angina pectoris: Secondary | ICD-10-CM | POA: Diagnosis not present

## 2022-05-15 DIAGNOSIS — I471 Supraventricular tachycardia, unspecified: Secondary | ICD-10-CM | POA: Diagnosis not present

## 2022-05-15 DIAGNOSIS — I10 Essential (primary) hypertension: Secondary | ICD-10-CM | POA: Diagnosis not present

## 2022-05-15 DIAGNOSIS — D649 Anemia, unspecified: Secondary | ICD-10-CM | POA: Diagnosis not present

## 2022-05-15 DIAGNOSIS — R0789 Other chest pain: Secondary | ICD-10-CM | POA: Diagnosis not present

## 2022-05-15 DIAGNOSIS — I480 Paroxysmal atrial fibrillation: Secondary | ICD-10-CM | POA: Diagnosis not present

## 2022-05-15 DIAGNOSIS — I4729 Other ventricular tachycardia: Secondary | ICD-10-CM | POA: Diagnosis not present

## 2022-05-15 MED ORDER — ISOSORBIDE MONONITRATE ER 30 MG PO TB24
30.0000 mg | ORAL_TABLET | Freq: Every day | ORAL | 5 refills | Status: DC
  Filled 2022-05-15: qty 30, 30d supply, fill #0
  Filled 2022-06-25: qty 30, 30d supply, fill #1
  Filled 2022-07-23: qty 30, 30d supply, fill #2
  Filled 2022-08-21: qty 30, 30d supply, fill #3
  Filled 2022-09-20: qty 30, 30d supply, fill #4
  Filled 2022-10-21: qty 30, 30d supply, fill #5

## 2022-05-20 ENCOUNTER — Emergency Department (HOSPITAL_COMMUNITY)
Admission: EM | Admit: 2022-05-20 | Discharge: 2022-05-21 | Disposition: A | Discharge status: Home / Self Care | Payer: Medicare Other | Attending: Emergency Medicine | Admitting: Emergency Medicine

## 2022-05-20 ENCOUNTER — Emergency Department (HOSPITAL_COMMUNITY): Payer: Medicare Other

## 2022-05-20 ENCOUNTER — Other Ambulatory Visit: Payer: Self-pay

## 2022-05-20 DIAGNOSIS — I10 Essential (primary) hypertension: Secondary | ICD-10-CM | POA: Insufficient documentation

## 2022-05-20 DIAGNOSIS — R062 Wheezing: Secondary | ICD-10-CM | POA: Diagnosis not present

## 2022-05-20 DIAGNOSIS — J189 Pneumonia, unspecified organism: Secondary | ICD-10-CM | POA: Diagnosis not present

## 2022-05-20 DIAGNOSIS — J101 Influenza due to other identified influenza virus with other respiratory manifestations: Secondary | ICD-10-CM | POA: Insufficient documentation

## 2022-05-20 DIAGNOSIS — J449 Chronic obstructive pulmonary disease, unspecified: Secondary | ICD-10-CM | POA: Insufficient documentation

## 2022-05-20 DIAGNOSIS — Z20822 Contact with and (suspected) exposure to covid-19: Secondary | ICD-10-CM | POA: Insufficient documentation

## 2022-05-20 DIAGNOSIS — R079 Chest pain, unspecified: Secondary | ICD-10-CM | POA: Diagnosis present

## 2022-05-20 DIAGNOSIS — R0789 Other chest pain: Secondary | ICD-10-CM | POA: Diagnosis not present

## 2022-05-20 DIAGNOSIS — R0602 Shortness of breath: Secondary | ICD-10-CM | POA: Diagnosis not present

## 2022-05-20 LAB — CBC
HCT: 38 % — ABNORMAL LOW (ref 39.0–52.0)
Hemoglobin: 12.5 g/dL — ABNORMAL LOW (ref 13.0–17.0)
MCH: 28.6 pg (ref 26.0–34.0)
MCHC: 32.9 g/dL (ref 30.0–36.0)
MCV: 87 fL (ref 80.0–100.0)
Platelets: 183 10*3/uL (ref 150–400)
RBC: 4.37 MIL/uL (ref 4.22–5.81)
RDW: 13.5 % (ref 11.5–15.5)
WBC: 4.8 10*3/uL (ref 4.0–10.5)
nRBC: 0 % (ref 0.0–0.2)

## 2022-05-20 LAB — BRAIN NATRIURETIC PEPTIDE: B Natriuretic Peptide: 193.4 pg/mL — ABNORMAL HIGH (ref 0.0–100.0)

## 2022-05-20 LAB — D-DIMER, QUANTITATIVE: D-Dimer, Quant: 0.33 ug/mL-FEU (ref 0.00–0.50)

## 2022-05-20 LAB — LACTIC ACID, PLASMA: Lactic Acid, Venous: 1.2 mmol/L (ref 0.5–1.9)

## 2022-05-20 MED ORDER — SODIUM CHLORIDE 0.9 % IV SOLN
2.0000 g | Freq: Once | INTRAVENOUS | Status: AC
  Administered 2022-05-20: 2 g via INTRAVENOUS
  Filled 2022-05-20: qty 12.5

## 2022-05-20 MED ORDER — IPRATROPIUM-ALBUTEROL 0.5-2.5 (3) MG/3ML IN SOLN
3.0000 mL | Freq: Once | RESPIRATORY_TRACT | Status: AC
  Administered 2022-05-20: 3 mL via RESPIRATORY_TRACT
  Filled 2022-05-20: qty 3

## 2022-05-20 MED ORDER — VANCOMYCIN HCL 1750 MG/350ML IV SOLN
1750.0000 mg | Freq: Once | INTRAVENOUS | Status: AC
  Administered 2022-05-21: 1750 mg via INTRAVENOUS
  Filled 2022-05-20: qty 350

## 2022-05-20 MED ORDER — ACETAMINOPHEN 325 MG PO TABS
650.0000 mg | ORAL_TABLET | Freq: Once | ORAL | Status: AC
  Administered 2022-05-20: 650 mg via ORAL
  Filled 2022-05-20: qty 2

## 2022-05-20 MED ORDER — SODIUM CHLORIDE 0.9 % IV SOLN: 1.0000 g | Freq: Once | INTRAVENOUS | Status: DC

## 2022-05-20 MED ORDER — METHYLPREDNISOLONE SODIUM SUCC 125 MG IJ SOLR
125.0000 mg | Freq: Once | INTRAMUSCULAR | Status: AC
  Administered 2022-05-20: 125 mg via INTRAVENOUS
  Filled 2022-05-20: qty 2

## 2022-05-20 NOTE — ED Triage Notes (Signed)
Tightness in the center of the chest for about 2 days, associated with sob, cough, denies fevers. Admitted in [place] for PNA

## 2022-05-20 NOTE — ED Provider Triage Note (Signed)
Emergency Medicine Provider Triage Evaluation Note  Naksh Radi , a 76 y.o. male  was evaluated in triage.  Family member translating. Complaining of shortness of breath and chest tightness for the last 2 days. + dry cough and generalized weakness. Pleuritic nature to chest pain. No fever, chills, abd pain, n/v/d. Hospitalized last month for pneumonia, recovered, was feeling back at his baseline until 2 days ago. Hx COPD as well. Does have relief of symptoms with nebulizer treatments at home.   Review of Systems  Positive: See HPI Negative: See HPI  Physical Exam  BP 138/73 (BP Location: Right Arm)   Pulse 84   Temp 98.5 F (36.9 C) (Oral)   Resp 18   Ht '5\' 10"'$  (1.778 m)   Wt 83.9 kg   SpO2 97%   BMI 26.54 kg/m  Gen:   Awake, no distress   Resp:  Normal effort, moderate diffuse wheezing, mild rales to left base MSK:   Moves extremities without difficulty, no LE edema, no calf tenderness Other:  Regular rate and rhythm, alert and answering questions appropriately per family member translating, speaking in multiple word sentences with mild dyspnea but maintaining oxygen saturation on monitor, minimal left sided chest wall tenderness  Medical Decision Making  Medically screening exam initiated at 9:00 PM.  Appropriate orders placed.  Saad Krauser was informed that the remainder of the evaluation will be completed by another provider, this initial triage assessment does not replace that evaluation, and the importance of remaining in the ED until their evaluation is complete.     Turner Daniels 05/20/22 2104

## 2022-05-20 NOTE — Progress Notes (Signed)
A consult was received from an ED physician for Vancomycin per pharmacy dosing.  The patient's profile has been reviewed for ht/wt/allergies/indication/available labs.    A one time order has been placed for Vancomycin '1750mg'$  IV and Cefepime 2gm IV.    Further antibiotics/pharmacy consults should be ordered by admitting physician if indicated.                       Thank you, Everette Rank, PharmD 05/20/2022  10:59 PM

## 2022-05-20 NOTE — ED Provider Notes (Signed)
[facility] DEPT Provider Note   CSN: [id number] Arrival date & time: 05/20/22  2029     History  Chief Complaint  Patient presents with   Chest Pain    Juliano Duman is a 76 y.o. male.  Patient with a history of COPD, hypertension, glaucoma, A-fib on Eliquis, recent admission for pneumonia about a month ago presenting with 2 days history of chest tightness and shortness of breath.  Family and patient declined Optometrist.  He describes left-sided chest pain that comes and goes lasting for several hours at a time associate with some shortness of breath and dizziness.  Chest pain is left-sided is not exertional or pleuritic.  Does have a dry cough and shortness of breath.  No fever, chills, abdominal pain, nausea or vomiting.  Denies any history of CAD or stents.  States compliance with medications.  The history is provided by the patient.  Chest Pain Associated symptoms: shortness of breath   Associated symptoms: no abdominal pain, no dizziness, no fever, no headache, no nausea, no vomiting and no weakness        Home Medications Prior to Admission medications   Medication Sig Start Date End Date Taking? Authorizing Provider  acetaminophen (TYLENOL) 500 MG tablet Take 500 mg by mouth as needed for moderate pain.    [provider]  albuterol (PROVENTIL) (2.5 MG/3ML) 0.083% nebulizer solution Take 3 mLs (2.5 mg total) by nebulization every 6 (six) hours as needed for wheezing or shortness of breath. 04/18/22 07/17/22  Allie Bossier, MD  albuterol (VENTOLIN HFA) 108 (90 Base) MCG/ACT inhaler Inhale 2 puffs into the lungs every 4 (four) hours as needed. Patient taking differently: Inhale 2 puffs into the lungs every 4 (four) hours as needed for wheezing or shortness of breath. 04/13/22     apixaban (ELIQUIS) 5 MG TABS tablet Take 1 tablet (5 mg total) by mouth 2 (two) times daily. 04/11/22     atorvastatin (LIPITOR) 40 MG tablet Take 1  tablet (40 mg total) by mouth daily. 02/23/22     atropine 1 % ophthalmic solution Place 1 drop into the right eye 2 (two) times daily. 03/14/22 03/14/23  [provider]  azithromycin (ZITHROMAX) 250 MG tablet 1 tablet p.o. daily 04/18/22   Allie Bossier, MD  Budeson-Glycopyrrol-Formoterol (BREZTRI AEROSPHERE) 160-9-4.8 MCG/ACT AERO Inhale 2 puffs into the lungs in the morning and at bedtime. 05/31/21   Gildardo Pounds, NP  cefdinir (OMNICEF) 300 MG capsule Take 1 capsule (300 mg total) by mouth every 12 (twelve) hours. 04/18/22   Allie Bossier, MD  dorzolamide (TRUSOPT) 2 % ophthalmic solution Place 1 drop into the right eye 3 (three) times daily 03/14/22     finasteride (PROSCAR) 5 MG tablet Take 1 tablet (5 mg total) by mouth daily. 03/27/22   Bruning, Ashlyn, PA-C  fluticasone (FLONASE) 50 MCG/ACT nasal spray Inhale 2 sprays into both nostrils daily 04/23/22     guaiFENesin-dextromethorphan (ROBITUSSIN DM) 100-10 MG/5ML syrup Take 5 mLs by mouth every 4 (four) hours as needed for cough. 04/18/22   Allie Bossier, MD  HYDROcodone-acetaminophen (NORCO/VICODIN) 5-325 MG tablet Take 1 tablet by mouth every 6 (six) hours as needed for moderate pain. 04/18/22   Allie Bossier, MD  isosorbide mononitrate (IMDUR) 30 MG 24 hr tablet Take 1 tablet (30 mg total) by mouth daily. 05/15/22     latanoprost (XALATAN) 0.005 % ophthalmic solution Place 1 drop into both eyes at bedtime. 03/14/22  [provider]  metoprolol succinate (TOPROL-XL) 25 MG 24 hr tablet Take one tablet (25 mg dose) by mouth every 12 (twelve) hours. Please, hold Metoprolol if systolic BP is less than 269 or HR is less than 60. Patient taking differently: Take 25 mg by mouth every 12 (twelve) hours. Holf if systolic BP is less than 485 or HR is less than 60. 04/10/22     pantoprazole (PROTONIX) 40 MG tablet Take 1 tablet (40 mg total) by mouth daily. 04/19/22   Allie Bossier, MD  predniSONE (DELTASONE) 50 MG tablet  1 tab p.o. daily 04/19/22   Allie Bossier, MD  ranolazine (RANEXA) 500 MG 12 hr tablet Take 1 tablet (500 mg total) by mouth 2 (two) times daily. 02/22/22     tamsulosin (FLOMAX) 0.4 MG CAPS capsule Take 2 capsules (0.8 mg total) by mouth daily. 03/27/22   Bruning, Ashlyn, PA-C  timolol (TIMOPTIC) 0.5 % ophthalmic solution Place 1 drop into both eyes 2 (two) times daily. 03/14/22   [provider]      Allergies    Patient has no known allergies.    Review of Systems   Review of Systems  Constitutional:  Negative for activity change, appetite change and fever.  HENT:  Negative for congestion and rhinorrhea.   Respiratory:  Positive for chest tightness and shortness of breath.   Cardiovascular:  Positive for chest pain. Negative for leg swelling.  Gastrointestinal:  Negative for abdominal pain, nausea and vomiting.  Genitourinary:  Negative for dysuria and hematuria.  Musculoskeletal:  Negative for arthralgias and myalgias.  Skin:  Negative for rash.  Neurological:  Negative for dizziness, weakness and headaches.   all other systems are negative except as noted in the HPI and PMH.    Physical Exam Updated Vital Signs BP 138/73 (BP Location: Right Arm)   Pulse 84   Temp 98.5 F (36.9 C) (Oral)   Resp 18   Ht '5\' 10"'$  (1.778 m)   Wt 83.9 kg   SpO2 97%   BMI 26.54 kg/m  Physical Exam Vitals and nursing note reviewed.  Constitutional:      General: He is in acute distress.     Appearance: He is well-developed. He is ill-appearing.     Comments: Dyspnea with conversation  HENT:     Head: Normocephalic and atraumatic.     Mouth/Throat:     Pharynx: No oropharyngeal exudate.  Eyes:     Conjunctiva/sclera: Conjunctivae normal.     Pupils: Pupils are equal, round, and reactive to light.  Neck:     Comments: No meningismus. Cardiovascular:     Rate and Rhythm: Normal rate and regular rhythm.     Heart sounds: Normal heart sounds. No murmur heard. Pulmonary:      Effort: Respiratory distress present.     Breath sounds: Wheezing present.  Abdominal:     Palpations: Abdomen is soft.     Tenderness: There is no abdominal tenderness. There is no guarding or rebound.  Musculoskeletal:        General: No tenderness. Normal range of motion.     Cervical back: Normal range of motion and neck supple.  Skin:    General: Skin is warm.  Neurological:     Mental Status: He is alert and oriented to person, place, and time.     Cranial Nerves: No cranial nerve deficit.     Motor: No abnormal muscle tone.     Coordination: Coordination normal.  Comments: No ataxia on finger to nose bilaterally. No pronator drift. 5/5 strength throughout. CN 2-12 intact.Equal grip strength. Sensation intact.   Psychiatric:        Behavior: Behavior normal.     ED Results / Procedures / Treatments   Labs (all labs ordered are listed, but only abnormal results are displayed) Labs Reviewed  CBC - Abnormal; Notable for the following components:      Result Value   Hemoglobin 12.5 (*)    HCT 38.0 (*)    All other components within normal limits  RESP PANEL BY RT-PCR (RSV, FLU A&B, COVID)  RVPGX2  CULTURE, BLOOD (ROUTINE X 2)  CULTURE, BLOOD (ROUTINE X 2)  D-DIMER, QUANTITATIVE  BASIC METABOLIC PANEL  BRAIN NATRIURETIC PEPTIDE  LACTIC ACID, PLASMA  LACTIC ACID, PLASMA  TROPONIN I (HIGH SENSITIVITY)  TROPONIN I (HIGH SENSITIVITY)    EKG EKG Interpretation  Date/Time:  Sunday May 20 2022 21:30:31 EST Ventricular Rate:  73 PR Interval:  227 QRS Duration: 102 QT Interval:  388 QTC Calculation: 428 R Axis:   -59 Text Interpretation: Sinus rhythm Atrial premature complexes Prolonged PR interval Left anterior fascicular block Abnormal R-wave progression, late transition now sinus Confirmed by Ezequiel Essex [phone]) on 05/20/2022 9:34:38 PM  Radiology DG Chest Port 1 View  Result Date: 05/20/2022 CLINICAL DATA:  Chest tightness and shortness of breath  EXAM: PORTABLE CHEST 1 VIEW COMPARISON:  04/16/2022 FINDINGS: Right basilar airspace opacities suspicious for pneumonia. The previous left basilar opacities have resolved. Stable cardiomediastinal silhouette. No pleural effusion or pneumothorax. IMPRESSION: Hazy opacity in the right lower lung suspicious for pneumonia. Electronically Signed   By: Placido Sou M.D.   On: 05/20/2022 21:29    Procedures Procedures    Medications Ordered in ED Medications  ipratropium-albuterol (DUONEB) 0.5-2.5 (3) MG/3ML nebulizer solution 3 mL (has no administration in time range)  methylPREDNISolone sodium succinate (SOLU-MEDROL) 125 mg/2 mL injection 125 mg (has no administration in time range)    ED Course/ Medical Decision Making/ A&P                           Medical Decision Making Amount and/or Complexity of Data Reviewed Labs: ordered. Decision-making details documented in ED Course. Radiology: ordered and independent interpretation performed. Decision-making details documented in ED Course. ECG/medicine tests: ordered and independent interpretation performed. Decision-making details documented in ED Course.  Risk Prescription drug management.   Chest tightness and shortness of breath for the past 2 days.  Wheezing on arrival with some respiratory distress.  EKG without acute ischemia.  Patient given bronchodilators and steroids.  Chest x-ray remarkable for possible right lower lobe pneumonia.  Patient developed fever 102.8 in the ED.  Remains tachypneic but not hypoxic.  He is wheezing.  X-ray as above does show right lower lobe infiltrate.  Broad-spectrum antibiotics started after cultures are obtained.  COVID and flu swabs are pending.  Will also give IV fluids.  Low suspicion for ACS but patient will need to have a cardiac rule out.  Anticipate he will need admission for his COPD exacerbation with pneumonia.  Care to be transferred at shift change to Dr. Florina Ou while labs are pending.         Final Clinical Impression(s) / ED Diagnoses Final diagnoses:  None    Rx / DC Orders ED Discharge Orders     None         Honi Name, Annie Main, MD 05/20/22 2301

## 2022-05-21 DIAGNOSIS — J189 Pneumonia, unspecified organism: Secondary | ICD-10-CM | POA: Diagnosis not present

## 2022-05-21 LAB — LACTIC ACID, PLASMA: Lactic Acid, Venous: 0.9 mmol/L (ref 0.5–1.9)

## 2022-05-21 LAB — BASIC METABOLIC PANEL
Anion gap: 8 (ref 5–15)
BUN: 15 mg/dL (ref 8–23)
CO2: 22 mmol/L (ref 22–32)
Calcium: 7.7 mg/dL — ABNORMAL LOW (ref 8.9–10.3)
Chloride: 106 mmol/L (ref 98–111)
Creatinine, Ser: 1.19 mg/dL (ref 0.61–1.24)
GFR, Estimated: >60 mL/min (ref >60)
Glucose, Bld: 93 mg/dL (ref 70–99)
Potassium: 3.7 mmol/L (ref 3.5–5.1)
Sodium: 136 mmol/L (ref 135–145)

## 2022-05-21 LAB — TROPONIN I (HIGH SENSITIVITY): Troponin I (High Sensitivity): 8 ng/L (ref <18)

## 2022-05-21 LAB — RESP PANEL BY RT-PCR (RSV, FLU A&B, COVID)  RVPGX2
Influenza A by PCR: POSITIVE — AB
Influenza B by PCR: NEGATIVE
Resp Syncytial Virus by PCR: NEGATIVE
SARS Coronavirus 2 by RT PCR: NEGATIVE

## 2022-05-21 MED ORDER — OSELTAMIVIR PHOSPHATE 75 MG PO CAPS
75.0000 mg | ORAL_CAPSULE | Freq: Once | ORAL | Status: AC
  Administered 2022-05-21: 75 mg via ORAL
  Filled 2022-05-21: qty 1

## 2022-05-21 MED ORDER — DOXYCYCLINE HYCLATE 100 MG PO CAPS: 100.0000 mg | ORAL_CAPSULE | Freq: Two times a day (BID) | ORAL | 0 refills | Status: DC

## 2022-05-21 MED ORDER — OSELTAMIVIR PHOSPHATE 75 MG PO CAPS: 75.0000 mg | ORAL_CAPSULE | Freq: Two times a day (BID) | ORAL | 0 refills | Status: DC

## 2022-05-21 NOTE — ED Provider Notes (Signed)
Nursing notes and vitals signs, including pulse oximetry, reviewed.  Summary of this visit's results, reviewed by myself:  EKG:  EKG Interpretation  Date/Time:  Sunday May 20 2022 21:30:31 EST Ventricular Rate:  73 PR Interval:  227 QRS Duration: 102 QT Interval:  388 QTC Calculation: 428 R Axis:   -59 Text Interpretation: Sinus rhythm Atrial premature complexes Prolonged PR interval Left anterior fascicular block Abnormal R-wave progression, late transition now sinus Confirmed by Ezequiel Essex [phone]) on 05/20/2022 9:34:38 PM        Labs:  Results for orders placed or performed during the hospital encounter of 05/20/22 (from the past 24 hour(s))  CBC     Status: Abnormal   Collection Time: 05/20/22 10:29 PM  Result Value Ref Range   WBC 4.8 4.0 - 10.5 K/uL   RBC 4.37 4.22 - 5.81 MIL/uL   Hemoglobin 12.5 (L) 13.0 - 17.0 g/dL   HCT 38.0 (L) 39.0 - 52.0 %   MCV 87.0 80.0 - 100.0 fL   MCH 28.6 26.0 - 34.0 pg   MCHC 32.9 30.0 - 36.0 g/dL   RDW 13.5 11.5 - 15.5 %   Platelets 183 150 - 400 K/uL   nRBC 0.0 0.0 - 0.2 %  Brain natriuretic peptide     Status: Abnormal   Collection Time: 05/20/22 10:29 PM  Result Value Ref Range   B Natriuretic Peptide 193.4 (H) 0.0 - 100.0 pg/mL  Resp panel by RT-PCR (RSV, Flu A&B, Covid) Anterior Nasal Swab     Status: Abnormal   Collection Time: 05/20/22 10:30 PM   Specimen: Anterior Nasal Swab  Result Value Ref Range   SARS Coronavirus 2 by RT PCR NEGATIVE NEGATIVE   Influenza A by PCR POSITIVE (A) NEGATIVE   Influenza B by PCR NEGATIVE NEGATIVE   Resp Syncytial Virus by PCR NEGATIVE NEGATIVE  D-dimer, quantitative     Status: None   Collection Time: 05/20/22 10:30 PM  Result Value Ref Range   D-Dimer, Quant 0.33 0.00 - 0.50 ug/mL-FEU  Blood culture (routine x 2)     Status: None (Preliminary result)   Collection Time: 05/20/22 10:35 PM   Specimen: BLOOD RIGHT FOREARM  Result Value Ref Range   Specimen Description      BLOOD  RIGHT FOREARM Performed at [facility], [address] [place], [place] [address]    Special Requests      BOTTLES DRAWN AEROBIC AND ANAEROBIC Blood Culture adequate volume Performed at [facility] [address], [place], [place] [address]    Culture PENDING    Report Status PENDING   Lactic acid, plasma     Status: None   Collection Time: 05/20/22 10:40 PM  Result Value Ref Range   Lactic Acid, Venous 1.2 0.5 - 1.9 mmol/L    Imaging Studies: DG Chest Port 1 View  Result Date: 05/20/2022 CLINICAL DATA:  Chest tightness and shortness of breath EXAM: PORTABLE CHEST 1 VIEW COMPARISON:  04/16/2022 FINDINGS: Right basilar airspace opacities suspicious for pneumonia. The previous left basilar opacities have resolved. Stable cardiomediastinal silhouette. No pleural effusion or pneumothorax. IMPRESSION: Hazy opacity in the right lower lung suspicious for pneumonia. Electronically Signed   By: Placido Sou M.D.   On: 05/20/2022 21:29    1:02 AM The patient feels much better.  His lungs are nearly clear.  He is in no distress and would like to try going home.  He was advised that if he worsens he should  return and we would consider admitting him.  We will start him on Tamiflu for his influenza A.  The questionable opacity in his right lung base is similar to that seen on 04/16/2022.  We will treat him with doxycycline as well.   Goddess Gebbia, Jenny Reichmann, MD 05/21/22 [phone]

## 2022-05-26 LAB — CULTURE, BLOOD (ROUTINE X 2)
Culture: NO GROWTH
Culture: NO GROWTH
Special Requests: ADEQUATE

## 2022-05-29 ENCOUNTER — Other Ambulatory Visit: Payer: Self-pay

## 2022-05-29 MED ORDER — TIMOLOL MALEATE 0.5 % OP SOLN: 1.0000 [drp] | Freq: Two times a day (BID) | OPHTHALMIC | 3 refills | Status: DC

## 2022-05-29 MED ORDER — ERYTHROMYCIN 5 MG/GM OP OINT
TOPICAL_OINTMENT | OPHTHALMIC | 11 refills | Status: DC
  Filled 2022-05-29: qty 3.5, 30d supply, fill #0
  Filled 2022-07-03: qty 3.5, 30d supply, fill #1
  Filled 2022-08-13: qty 3.5, 30d supply, fill #2
  Filled 2022-10-21: qty 3.5, 30d supply, fill #3
  Filled 2022-12-03: qty 3.5, 14d supply, fill #4
  Filled 2022-12-19: qty 3.5, 30d supply, fill #4
  Filled 2023-02-11: qty 3.5, 30d supply, fill #5
  Filled 2023-03-26: qty 3.5, 30d supply, fill #6
  Filled 2023-05-12: qty 3.5, 30d supply, fill #7

## 2022-05-29 MED ORDER — TIMOLOL MALEATE 0.5 % OP SOLN
1.0000 [drp] | Freq: Two times a day (BID) | OPHTHALMIC | 11 refills | Status: DC
  Filled 2022-05-29: qty 5, 50d supply, fill #0
  Filled 2022-07-19: qty 5, 50d supply, fill #1

## 2022-05-29 MED ORDER — LATANOPROST 0.005 % OP SOLN
1.0000 [drp] | Freq: Every day | OPHTHALMIC | 6 refills | Status: DC
  Filled 2022-05-29: qty 2.5, 50d supply, fill #0

## 2022-05-30 ENCOUNTER — Other Ambulatory Visit: Payer: Self-pay

## 2022-06-01 ENCOUNTER — Other Ambulatory Visit: Payer: Self-pay

## 2022-06-01 MED ORDER — VALSARTAN 40 MG PO TABS
40.0000 mg | ORAL_TABLET | Freq: Every day | ORAL | 5 refills | Status: DC
  Filled 2022-06-01: qty 30, 30d supply, fill #0
  Filled 2022-06-25: qty 30, 30d supply, fill #1
  Filled 2022-07-24: qty 30, 30d supply, fill #2

## 2022-06-04 ENCOUNTER — Encounter (HOSPITAL_BASED_OUTPATIENT_CLINIC_OR_DEPARTMENT_OTHER): Payer: Self-pay | Admitting: Urology

## 2022-06-04 DIAGNOSIS — R591 Generalized enlarged lymph nodes: Secondary | ICD-10-CM | POA: Diagnosis not present

## 2022-06-04 DIAGNOSIS — K219 Gastro-esophageal reflux disease without esophagitis: Secondary | ICD-10-CM | POA: Diagnosis not present

## 2022-06-04 DIAGNOSIS — E789 Disorder of lipoprotein metabolism, unspecified: Secondary | ICD-10-CM | POA: Diagnosis not present

## 2022-06-04 DIAGNOSIS — I10 Essential (primary) hypertension: Secondary | ICD-10-CM | POA: Diagnosis not present

## 2022-06-04 DIAGNOSIS — J189 Pneumonia, unspecified organism: Secondary | ICD-10-CM | POA: Diagnosis not present

## 2022-06-04 DIAGNOSIS — Z87438 Personal history of other diseases of male genital organs: Secondary | ICD-10-CM | POA: Diagnosis not present

## 2022-06-04 DIAGNOSIS — E559 Vitamin D deficiency, unspecified: Secondary | ICD-10-CM | POA: Diagnosis not present

## 2022-06-04 DIAGNOSIS — C61 Malignant neoplasm of prostate: Secondary | ICD-10-CM | POA: Diagnosis not present

## 2022-06-04 DIAGNOSIS — I2510 Atherosclerotic heart disease of native coronary artery without angina pectoris: Secondary | ICD-10-CM | POA: Diagnosis not present

## 2022-06-04 DIAGNOSIS — R7303 Prediabetes: Secondary | ICD-10-CM | POA: Diagnosis not present

## 2022-06-04 NOTE — Progress Notes (Addendum)
Called and spoke w/ Jeannene Patella , OR scheduler for Dr Lovena Neighbours.  Requested for cardiac clearance for medical and eliquis.  Received call back from [facility] stated Dr Lovena Neighbours wants to reschedule pt since they were unaware is was eliquis for AFib.  Also, pt had pneumonia 04-16-2022 and 05-20-2022 had flu A 05-20-2022.  Received call from [facility] they are rescheduling pt.

## 2022-06-07 ENCOUNTER — Ambulatory Visit: Payer: Medicare Other | Admitting: Radiation Oncology

## 2022-06-12 ENCOUNTER — Other Ambulatory Visit: Payer: Self-pay | Admitting: Nurse Practitioner

## 2022-06-12 ENCOUNTER — Other Ambulatory Visit: Payer: Self-pay

## 2022-06-12 DIAGNOSIS — J4489 Other specified chronic obstructive pulmonary disease: Secondary | ICD-10-CM

## 2022-06-12 MED ORDER — RANOLAZINE ER 500 MG PO TB12
500.0000 mg | ORAL_TABLET | Freq: Two times a day (BID) | ORAL | 2 refills | Status: DC
  Filled 2022-06-12: qty 60, 30d supply, fill #0
  Filled 2022-07-12: qty 60, 30d supply, fill #1
  Filled 2022-08-12: qty 60, 30d supply, fill #2

## 2022-06-12 NOTE — Telephone Encounter (Signed)
Requested medication (s) are due for refill today -expired Rx  Requested medication (s) are on the active medication list -yes  Future visit scheduled -no  Last refill: 05/31/21 10.7g 4RF  Notes to clinic: not assigned protocol- provider review   Requested Prescriptions  Pending Prescriptions Disp Refills   Budeson-Glycopyrrol-Formoterol (BREZTRI AEROSPHERE) 160-9-4.8 MCG/ACT AERO 10.7 g 4    Sig: Inhale 2 puffs into the lungs in the morning and at bedtime.     Off-Protocol Failed - 06/12/2022 12:27 PM      Failed - Medication not assigned to a protocol, review manually.      Failed - Valid encounter within last 12 months    Recent Outpatient Visits           9 months ago Benign prostatic hyperplasia with incomplete bladder emptying   [place], Vernia Buff, NP   1 year ago Essential hypertension   [place], Vernia Buff, NP   1 year ago Chronic obstructive airway disease with asthma [facility])   [place] Elsie Stain, MD   3 years ago Chronic obstructive airway disease with asthma [facility])   [place] Elsie Stain, MD   3 years ago [place] Elsie Stain, MD                 Requested Prescriptions  Pending Prescriptions Disp Refills   Budeson-Glycopyrrol-Formoterol (BREZTRI AEROSPHERE) 160-9-4.8 MCG/ACT AERO 10.7 g 4    Sig: Inhale 2 puffs into the lungs in the morning and at bedtime.     Off-Protocol Failed - 06/12/2022 12:27 PM      Failed - Medication not assigned to a protocol, review manually.      Failed - Valid encounter within last 12 months    Recent Outpatient Visits           9 months ago Benign prostatic hyperplasia with incomplete bladder emptying   [place] Gildardo Pounds, NP   1 year ago Essential hypertension   [place], Zelda W, NP   1 year ago Chronic obstructive airway disease with asthma [facility])   [facility] Elsie Stain, MD   3 years ago Chronic obstructive airway disease with asthma [facility])   [place] Elsie Stain, MD   3 years ago [place] Elsie Stain, MD

## 2022-06-13 ENCOUNTER — Other Ambulatory Visit: Payer: Self-pay

## 2022-06-13 MED ORDER — ATORVASTATIN CALCIUM 40 MG PO TABS
40.0000 mg | ORAL_TABLET | Freq: Every day | ORAL | 1 refills | Status: DC
  Filled 2022-06-13: qty 90, 90d supply, fill #0

## 2022-06-13 MED ORDER — ALBUTEROL SULFATE HFA 108 (90 BASE) MCG/ACT IN AERS
2.0000 | INHALATION_SPRAY | RESPIRATORY_TRACT | 2 refills | Status: DC | PRN
  Filled 2022-06-13 (×2): qty 18, 17d supply, fill #0
  Filled 2022-12-03: qty 18, 17d supply, fill #1

## 2022-06-14 ENCOUNTER — Other Ambulatory Visit: Payer: Self-pay

## 2022-06-18 ENCOUNTER — Other Ambulatory Visit: Payer: Self-pay

## 2022-06-18 DIAGNOSIS — R0602 Shortness of breath: Secondary | ICD-10-CM | POA: Diagnosis not present

## 2022-06-18 DIAGNOSIS — I10 Essential (primary) hypertension: Secondary | ICD-10-CM | POA: Diagnosis not present

## 2022-06-18 DIAGNOSIS — E789 Disorder of lipoprotein metabolism, unspecified: Secondary | ICD-10-CM | POA: Diagnosis not present

## 2022-06-18 DIAGNOSIS — I2510 Atherosclerotic heart disease of native coronary artery without angina pectoris: Secondary | ICD-10-CM | POA: Diagnosis not present

## 2022-06-18 DIAGNOSIS — J189 Pneumonia, unspecified organism: Secondary | ICD-10-CM | POA: Diagnosis not present

## 2022-06-18 DIAGNOSIS — E559 Vitamin D deficiency, unspecified: Secondary | ICD-10-CM | POA: Diagnosis not present

## 2022-06-18 DIAGNOSIS — J4520 Mild intermittent asthma, uncomplicated: Secondary | ICD-10-CM | POA: Diagnosis not present

## 2022-06-18 DIAGNOSIS — C61 Malignant neoplasm of prostate: Secondary | ICD-10-CM | POA: Diagnosis not present

## 2022-06-18 DIAGNOSIS — K219 Gastro-esophageal reflux disease without esophagitis: Secondary | ICD-10-CM | POA: Diagnosis not present

## 2022-06-18 DIAGNOSIS — R591 Generalized enlarged lymph nodes: Secondary | ICD-10-CM | POA: Diagnosis not present

## 2022-06-18 DIAGNOSIS — Z87438 Personal history of other diseases of male genital organs: Secondary | ICD-10-CM | POA: Diagnosis not present

## 2022-06-18 DIAGNOSIS — R7303 Prediabetes: Secondary | ICD-10-CM | POA: Diagnosis not present

## 2022-06-18 MED ORDER — BUDESON-GLYCOPYRROL-FORMOTEROL 160-9-4.8 MCG/ACT IN AERO
2.0000 | INHALATION_SPRAY | Freq: Two times a day (BID) | RESPIRATORY_TRACT | 0 refills | Status: DC
  Filled 2022-06-18: qty 10.7, 30d supply, fill #0
  Filled 2022-07-05: qty 32.1, 90d supply, fill #0

## 2022-06-19 ENCOUNTER — Encounter (HOSPITAL_BASED_OUTPATIENT_CLINIC_OR_DEPARTMENT_OTHER): Payer: Self-pay | Admitting: Urology

## 2022-06-19 NOTE — Progress Notes (Addendum)
Spoke w/ via phone for pre-op interview--- pt's son, [name], and he put pt phone for 3 way  Lab needs dos---- [employer] results------  current EKG in epic/ care everywhere/ chart COVID test -----patient states asymptomatic no test needed Arrive at -------  0530 on [id number] NPO after MN only sips w/ meds Med rec completed Medications to take morning of surgery -----  imdur, breztri inhaler,  nebulizer night before surgery (pt did want to take any meds morning of surgery but it did get him to take these) Pt does take a beta blocker twice dialy with instructions to not take if systolic <468 and HR <03 He will have had night before surgery dose.  Diabetic medication ----- n/a Patient instructed no nail polish to be worn day of surgery Patient instructed to bring photo id and insurance card day of surgery Patient aware to have Driver (ride ) / caregiver    for 24 hours after surgery -- son and daughter Patient Special Instructions ----- will do one fleet enema night before surgery. Asked to bring rescue inhaler Pre-Op special Istructions -----  when asked about needing interpreter dos ,  per son he or pt's daughter will interpret dos.  Stated pt understands and speaks english but very heavy accent.  Over the phone pt did understand some but son did have to interpret also. Pt has cardiac office visit for pre-op clearance 06-18-2022 in epic/ chart.  Patient verbalized understanding of Instructions that were given at this phone interview. Patient denies shortness of breath, chest pain, fever, cough at this phone interview.   Anesthesia Review:  HTN;  orthostatic hypotension;  PAF w/ stable chronic angina;  COPD/ asthma;  recent CAP 11/ 2023 and CAP w/ flu 05-21-2022  Pt denies chest pain, usual irregular heartbeat, and only sob w/ asthma.  PCP: Cardiologist :  Dr Octavia Bruckner  (lov 06-18-2022 in office visit) Chest x-ray :  05-20-2022/  CTA 03-22-2022 EKG :  05-15-2022 epic/   care everywhere 06-18-2022 without tracing Echo :  03-16-2022 care everywhere Stress test: no Cardiac Cath : no Cardiac MRI: 10-25-2020  care everywhere/ coronary CT 11-21-2018 epic Activity level: stated sob w/ asthma Sleep Study/ CPAP : no  Blood Thinner/ Instructions Maryjane Hurter Dose: Eliquis ASA / Instructions/ Last Dose : no Pt and son stated was given instructions from cardiologist may stop 2 days prior to surgery, stated last dose 06-18-2022

## 2022-06-20 NOTE — H&P (Signed)
CC: Lower urinary tract symptoms, elevated PSA.  HPI:  12/10/2018  77 year old male is accompanied by his son who acts as a interpreter for him. The patient was recently hospitalized with covid back in April. Since then, he has had an increase in lower urinary tract symptoms. Most bothersome complaints are nocturia, weak stream, difficulty postponing urination, frequency. He is on Flomax and finasteride. PSA on 11/18/2018 was 4.2. Corrected for finasteride, this is 8.4. He does not have a family history of prostate cancer.   06/16/2019  Patient was scheduled for a prostate biopsy back in August. He however never showed up for it. He states that he canceled the appointment because he did not want to have a prostate biopsy. He is referred back for continued elevated PSA and lower urinary tract symptoms to follow him. He continues on Flomax and finasteride. He continues to have weak stream and straining to void as well as sensation of incomplete emptying. He was noted to have an abnormal prostate exam last visit.   02/02/2022  Patient returns for symptoms of BPH with lower urinary tract symptoms. I have seen him twice in the past for abnormal prostate exam and elevated PSA. He failed to follow-up for prostate biopsy both times. Apparently at the front desk was unhappy with his care but decided to see me anyway today. In the room, he stated that he did not mind seeing me and he had no problems. He is on tamsulosin 0.8 mg and finasteride. He continues to have persistent urinary complaints including weak stream and nocturia about 4 times a night.   03/12/2022  Patient is doing well since prostate biopsy. Prostate size 28 g. This was for PSA of 5.15, but he is on finasteride so corrected for finasteride this is 10.3. Unfortunately, biopsy revealed adenocarcinoma with prostate in 4 out of 12 cores, maximum Gleason score 4+4. He did have significant nodularity on prostate examination.   03/20/2022  Patient  thought about his options. He would like to proceed with initiation of ADT with [place]. PET scan is scheduled for Friday and radiation oncology consultation scheduled for the 31st.   04/24/2022: PET scan after last office visit showed no evidence of metastatic disease. He has met with radiation oncology and decided to pursue 8 weeks of external beam radiation treatment as well as long-term ADT. He had Firmagon at last visit and is due for Eligard today. PSA now 0.1 with castrate level testosterone.   Overall doing well. He was recently hospitalized for pneumonia but is recovering appropriately from such. Voiding symptoms grossly stable with no new or worsening symptomology including absence of any dysuria or gross hematuria. He denies any hot flashes but has had some fatigue especially considering recent pneumonia diagnosis. UA today within normal limits.     ALLERGIES: No Known Drug Allergies    MEDICATIONS: Finasteride PO Daily  Omeprazole 40 mg capsule,delayed release  Rapaflo 8 mg capsule 1 capsule PO Daily  Tamsulosin Hcl 0.4 mg capsule  Advair Diskus  Amlodipine Besylate 5 mg tablet  Aspir 81  Atorvastatin Calcium 40 mg tablet  Eye Drops  Famotidine  Losartan Potassium 100 mg tablet  Proventil Hfa  Valsartan 80 mg tablet  Ventolin Hfa     GU PSH: Prostate Needle Biopsy - 02/28/2022     NON-GU PSH: Corneal Transplant, Right Surgical Pathology, Gross And Microscopic Examination For Prostate Needle - 02/28/2022     GU PMH: Prostate Cancer - 03/20/2022, - 03/12/2022 Elevated PSA - 02/28/2022, -  02/02/2022, - 2020 Prostate nodule w/ LUTS - 02/28/2022, - 02/02/2022, - 2020 Nocturia (Stable) - 02/02/2022, - 2020 Urinary Frequency - 02/02/2022, - 2020 Weak Urinary Stream - 02/02/2022, - 2020    NON-GU PMH: Arthritis Asthma GERD Glaucoma Hypercholesterolemia Hypertension Myocardial Infarction    FAMILY HISTORY: 2 daughters - Other 3 Son's - Other Asthma - Runs in Family    SOCIAL HISTORY: Marital Status: Married Preferred Language: English; Race: Black or African American Current Smoking Status: Patient does not smoke anymore. Has not smoked since 11/26/1978.   Tobacco Use Assessment Completed: Used Tobacco in last 30 days? Does not drink caffeine.    REVIEW OF SYSTEMS:    GU Review Male:   Patient reports frequent urination, hard to postpone urination, and get up at night to urinate. Patient denies burning/ pain with urination, leakage of urine, stream starts and stops, trouble starting your stream, have to strain to urinate , erection problems, and penile pain.  Gastrointestinal (Upper):   Patient denies nausea, vomiting, and indigestion/ heartburn.  Gastrointestinal (Lower):   Patient denies diarrhea and constipation.  Constitutional:   Patient denies fever, night sweats, weight loss, and fatigue.  Skin:   Patient denies skin rash/ lesion and itching.  Eyes:   Patient denies blurred vision and double vision.  Ears/ Nose/ Throat:   Patient denies sore throat and sinus problems.  Hematologic/Lymphatic:   Patient denies swollen glands and easy bruising.  Cardiovascular:   Patient denies leg swelling and chest pains.  Respiratory:   Patient denies cough and shortness of breath.  Endocrine:   Patient denies excessive thirst.  Musculoskeletal:   Patient denies back pain and joint pain.  Neurological:   Patient denies headaches and dizziness.  Psychologic:   Patient denies depression and anxiety.   Notes: Updated from previous visit 02/02/2022 with review from patient as noted above.    MULTI-SYSTEM PHYSICAL EXAMINATION:    Constitutional: Well-nourished. No physical deformities. Normally developed. Good grooming.  Neck: Neck symmetrical, not swollen. Normal tracheal position.  Respiratory: No labored breathing, no use of accessory muscles.   Cardiovascular: Normal temperature, normal extremity pulses, no swelling, no varicosities.  Skin: No paleness, no  jaundice, no cyanosis. No lesion, no ulcer, no rash.  Neurologic / Psychiatric: Oriented to time, oriented to place, oriented to person. No depression, no anxiety, no agitation.  Gastrointestinal: No mass, no tenderness, no rigidity, non obese abdomen.  Musculoskeletal: Normal gait and station of head and neck.       ASSESSMENT:      ICD-10 Details  1 GU:   Prostate Cancer - C61 Chronic, Threat to Bodily Function   PLAN:     He has done well after initiation of ADT with [place]. Patient now scheduled for fiducial marker placement in early January in anticipation of starting XRT for 8 weeks. He will have 77-monthEligard administered today. We discussed the importance of calcium and vitamin D supplementation for maintenance of bone health. I also recommended staying active with some degree of aerobic and weight bearing exercise as well. Healthy well-balanced diet also encouraged. We discussed his upcoming fiducial marker placement as well as expected lower urinary tract symptoms that can occur with XRT. Behavioral and fluid modification to augment increased irritative symptoms discussed. I also communicated follow-up instructions for new or worsening symptomology especially in regards to increased lower urinary tract symptoms and poor tolerability of ADT which usually presents as vasomotor symptoms and fatigue. Tentatively the patient will be scheduled for return  in about 3 months for repeat labs, repeat Eligard and MD exam.   All questions answered to the best of my ability regarding the upcoming procedure and expected postoperative course with understanding expressed by the patient and family member present today.

## 2022-06-21 ENCOUNTER — Other Ambulatory Visit: Payer: Self-pay

## 2022-06-21 ENCOUNTER — Ambulatory Visit (HOSPITAL_BASED_OUTPATIENT_CLINIC_OR_DEPARTMENT_OTHER): Payer: Medicare Other | Admitting: Anesthesiology

## 2022-06-21 ENCOUNTER — Encounter (HOSPITAL_BASED_OUTPATIENT_CLINIC_OR_DEPARTMENT_OTHER)
Admission: RE | Disposition: A | Discharge status: Home / Self Care | Payer: Self-pay | Source: Home / Self Care | Attending: Urology

## 2022-06-21 ENCOUNTER — Encounter (HOSPITAL_BASED_OUTPATIENT_CLINIC_OR_DEPARTMENT_OTHER): Payer: Self-pay | Admitting: Urology

## 2022-06-21 ENCOUNTER — Ambulatory Visit (HOSPITAL_BASED_OUTPATIENT_CLINIC_OR_DEPARTMENT_OTHER)
Admission: RE | Admit: 2022-06-21 | Discharge: 2022-06-21 | Disposition: A | Discharge status: Home / Self Care | Payer: Medicare Other | Attending: Urology | Admitting: Urology

## 2022-06-21 DIAGNOSIS — I2510 Atherosclerotic heart disease of native coronary artery without angina pectoris: Secondary | ICD-10-CM

## 2022-06-21 DIAGNOSIS — I10 Essential (primary) hypertension: Secondary | ICD-10-CM

## 2022-06-21 DIAGNOSIS — Z01818 Encounter for other preprocedural examination: Secondary | ICD-10-CM

## 2022-06-21 DIAGNOSIS — C61 Malignant neoplasm of prostate: Secondary | ICD-10-CM | POA: Diagnosis not present

## 2022-06-21 DIAGNOSIS — J449 Chronic obstructive pulmonary disease, unspecified: Secondary | ICD-10-CM | POA: Diagnosis not present

## 2022-06-21 DIAGNOSIS — Z87891 Personal history of nicotine dependence: Secondary | ICD-10-CM | POA: Diagnosis not present

## 2022-06-21 DIAGNOSIS — I4891 Unspecified atrial fibrillation: Secondary | ICD-10-CM | POA: Diagnosis not present

## 2022-06-21 HISTORY — DX: Presence of spectacles and contact lenses: Z97.3

## 2022-06-21 HISTORY — PX: GOLD SEED IMPLANT: SHX6343

## 2022-06-21 HISTORY — DX: Other specified chronic obstructive pulmonary disease: J44.89

## 2022-06-21 HISTORY — DX: Complete loss of teeth, unspecified cause, unspecified class: Z97.2

## 2022-06-21 HISTORY — DX: Pneumonia, unspecified organism: J18.9

## 2022-06-21 HISTORY — PX: SPACE OAR INSTILLATION: SHX6769

## 2022-06-21 HISTORY — DX: Malignant neoplasm of prostate: C61

## 2022-06-21 HISTORY — DX: Atrioventricular block, first degree: I44.0

## 2022-06-21 HISTORY — DX: Long term (current) use of anticoagulants: Z79.01

## 2022-06-21 HISTORY — DX: Atherosclerotic heart disease of native coronary artery without angina pectoris: I25.10

## 2022-06-21 HISTORY — DX: Neurotrophic keratoconjunctivitis, right eye: H16.231

## 2022-06-21 HISTORY — DX: Chronic kidney disease, stage 2 (mild): N18.2

## 2022-06-21 HISTORY — DX: Complete loss of teeth, unspecified cause, unspecified class: K08.109

## 2022-06-21 HISTORY — DX: Other chronic pain: G89.29

## 2022-06-21 HISTORY — DX: Unspecified osteoarthritis, unspecified site: M19.90

## 2022-06-21 LAB — POCT I-STAT, CHEM 8
BUN: 16 mg/dL (ref 8–23)
Calcium, Ion: 1.25 mmol/L (ref 1.15–1.40)
Chloride: 101 mmol/L (ref 98–111)
Creatinine, Ser: 1.3 mg/dL — ABNORMAL HIGH (ref 0.61–1.24)
Glucose, Bld: 100 mg/dL — ABNORMAL HIGH (ref 70–99)
HCT: 33 % — ABNORMAL LOW (ref 39.0–52.0)
Hemoglobin: 11.2 g/dL — ABNORMAL LOW (ref 13.0–17.0)
Potassium: 4 mmol/L (ref 3.5–5.1)
Sodium: 140 mmol/L (ref 135–145)
TCO2: 26 mmol/L (ref 22–32)

## 2022-06-21 SURGERY — INSERTION, GOLD SEEDS
Anesthesia: Monitor Anesthesia Care | Site: Prostate

## 2022-06-21 MED ORDER — SODIUM CHLORIDE 0.9 % IV SOLN: INTRAVENOUS | Status: DC

## 2022-06-21 MED ORDER — FENTANYL CITRATE (PF) 100 MCG/2ML IJ SOLN
INTRAMUSCULAR | Status: AC
  Filled 2022-06-21: qty 2

## 2022-06-21 MED ORDER — OXYCODONE HCL 5 MG PO TABS: 5.0000 mg | ORAL_TABLET | Freq: Once | ORAL | Status: DC | PRN

## 2022-06-21 MED ORDER — PROPOFOL 10 MG/ML IV BOLUS
INTRAVENOUS | Status: DC | PRN
  Administered 2022-06-21: 20 mg via INTRAVENOUS

## 2022-06-21 MED ORDER — ONDANSETRON HCL 4 MG/2ML IJ SOLN: 4.0000 mg | Freq: Four times a day (QID) | INTRAMUSCULAR | Status: DC | PRN

## 2022-06-21 MED ORDER — FENTANYL CITRATE (PF) 100 MCG/2ML IJ SOLN
INTRAMUSCULAR | Status: DC | PRN
  Administered 2022-06-21: 50 ug via INTRAVENOUS

## 2022-06-21 MED ORDER — LACTATED RINGERS IV SOLN: INTRAVENOUS | Status: DC

## 2022-06-21 MED ORDER — CEFAZOLIN SODIUM-DEXTROSE 2-4 GM/100ML-% IV SOLN
2.0000 g | INTRAVENOUS | Status: AC
  Administered 2022-06-21: 2 g via INTRAVENOUS

## 2022-06-21 MED ORDER — PROPOFOL 10 MG/ML IV BOLUS
INTRAVENOUS | Status: AC
  Filled 2022-06-21: qty 20

## 2022-06-21 MED ORDER — FENTANYL CITRATE (PF) 100 MCG/2ML IJ SOLN: 25.0000 ug | INTRAMUSCULAR | Status: DC | PRN

## 2022-06-21 MED ORDER — SODIUM CHLORIDE (PF) 0.9 % IJ SOLN
INTRAMUSCULAR | Status: DC | PRN
  Administered 2022-06-21: 10 mL

## 2022-06-21 MED ORDER — BUPIVACAINE HCL 0.25 % IJ SOLN
INTRAMUSCULAR | Status: DC | PRN
  Administered 2022-06-21: 10 mL

## 2022-06-21 MED ORDER — PROPOFOL 500 MG/50ML IV EMUL
INTRAVENOUS | Status: DC | PRN
  Administered 2022-06-21: 200 ug/kg/min via INTRAVENOUS

## 2022-06-21 MED ORDER — CEFAZOLIN SODIUM-DEXTROSE 2-4 GM/100ML-% IV SOLN
INTRAVENOUS | Status: AC
  Filled 2022-06-21: qty 100

## 2022-06-21 MED ORDER — ONDANSETRON HCL 4 MG/2ML IJ SOLN
INTRAMUSCULAR | Status: AC
  Filled 2022-06-21: qty 2

## 2022-06-21 MED ORDER — OXYCODONE HCL 5 MG/5ML PO SOLN: 5.0000 mg | Freq: Once | ORAL | Status: DC | PRN

## 2022-06-21 MED ORDER — PROPOFOL 500 MG/50ML IV EMUL
INTRAVENOUS | Status: AC
  Filled 2022-06-21: qty 50

## 2022-06-21 MED ORDER — ONDANSETRON HCL 4 MG/2ML IJ SOLN
INTRAMUSCULAR | Status: DC | PRN
  Administered 2022-06-21: 4 mg via INTRAVENOUS

## 2022-06-21 SURGICAL SUPPLY — 21 items
BLADE CLIPPER SENSICLIP SURGIC (BLADE) ×1 IMPLANT
CNTNR URN SCR LID CUP LEK RST (MISCELLANEOUS) ×1 IMPLANT
CONT SPEC 4OZ STRL OR WHT (MISCELLANEOUS) ×1
COVER BACK TABLE 60X90IN (DRAPES) ×1 IMPLANT
DRSG TEGADERM 4X4.75 (GAUZE/BANDAGES/DRESSINGS) ×1 IMPLANT
DRSG TEGADERM 8X12 (GAUZE/BANDAGES/DRESSINGS) ×1 IMPLANT
GAUZE SPONGE 4X4 12PLY STRL (GAUZE/BANDAGES/DRESSINGS) ×1 IMPLANT
GLOVE BIO SURGEON STRL SZ7.5 (GLOVE) ×1 IMPLANT
IMPL SPACEOAR VUE SYSTEM (Spacer) ×1 IMPLANT
IMPLANT SPACEOAR VUE SYSTEM (Spacer) ×1 IMPLANT
KIT TURNOVER CYSTO (KITS) ×1 IMPLANT
MARKER GOLD PRELOAD 1.2X3 (Urological Implant) ×1 IMPLANT
MARKER SKIN DUAL TIP RULER LAB (MISCELLANEOUS) ×1 IMPLANT
NDL SPNL 22GX3.5 QUINCKE BK (NEEDLE) ×1 IMPLANT
NEEDLE SPNL 22GX3.5 QUINCKE BK (NEEDLE) ×1 IMPLANT
SEED GOLD PRELOAD 1.2X3 (Urological Implant) ×1 IMPLANT
SHEATH ULTRASOUND LF (SHEATH) IMPLANT
SYR 10ML LL (SYRINGE) ×1 IMPLANT
SYR CONTROL 10ML LL (SYRINGE) ×1 IMPLANT
TOWEL OR 17X26 10 PK STRL BLUE (TOWEL DISPOSABLE) ×1 IMPLANT
UNDERPAD 30X36 HEAVY ABSORB (UNDERPADS AND DIAPERS) ×1 IMPLANT

## 2022-06-21 NOTE — Anesthesia Postprocedure Evaluation (Signed)
Anesthesia Post Note  Patient: Bryan Sullivan  Procedure(s) Performed: GOLD SEED IMPLANT (Prostate) SPACE OAR INSTILLATION (Prostate)     Patient location during evaluation: PACU Anesthesia Type: MAC Level of consciousness: awake and alert Pain management: pain level controlled Vital Signs Assessment: post-procedure vital signs reviewed and stable Respiratory status: spontaneous breathing, nonlabored ventilation, respiratory function stable and patient connected to nasal cannula oxygen Cardiovascular status: stable and blood pressure returned to baseline Postop Assessment: no apparent nausea or vomiting Anesthetic complications: no   No notable events documented.  Last Vitals:  Vitals:   06/21/22 0815 06/21/22 0830  BP: (!) 170/78 (!) 156/74  Pulse: 64 65  Resp: 18 18  Temp:    SpO2: 100% 98%    Last Pain:  Vitals:   06/21/22 0800  TempSrc:   PainSc: 0-No pain                 Shirlie Enck S

## 2022-06-21 NOTE — Discharge Instructions (Addendum)
You should avoid strenuous activities today but may resume all normal activities tomorrow.  2.   You can take Tylenol as needed for any pain or discomfort.  3.    Follow up with your radiation oncologist for your simulation appointment as scheduled.  If this is not currently scheduled or you do not know the date/time for that appointment, please contact the radiation oncology office to confirm.   4.   Restart Eliquis in 48 hrs.

## 2022-06-21 NOTE — Anesthesia Preprocedure Evaluation (Signed)
Anesthesia Evaluation  Patient identified by MRN, date of birth, ID band Patient awake    Reviewed: Allergy & Precautions, H&P , NPO status , Patient's Chart, lab work & pertinent test results  Airway Mallampati: II   Neck ROM: full    Dental   Pulmonary asthma , COPD, former smoker   breath sounds clear to auscultation       Cardiovascular hypertension, + CAD  + dysrhythmias Atrial Fibrillation  Rhythm:regular Rate:Normal     Neuro/Psych    GI/Hepatic ,GERD  ,,  Endo/Other    Renal/GU Renal InsufficiencyRenal disease     Musculoskeletal  (+) Arthritis ,    Abdominal   Peds  Hematology   Anesthesia Other Findings   Reproductive/Obstetrics                             Anesthesia Physical Anesthesia Plan  ASA: 3  Anesthesia Plan: MAC   Post-op Pain Management:    Induction: Intravenous  PONV Risk Score and Plan: 1 and Propofol infusion, Ondansetron and Treatment may vary due to age or medical condition  Airway Management Planned: Simple Face Mask  Additional Equipment:   Intra-op Plan:   Post-operative Plan:   Informed Consent: I have reviewed the patients History and Physical, chart, labs and discussed the procedure including the risks, benefits and alternatives for the proposed anesthesia with the patient or authorized representative who has indicated his/her understanding and acceptance.     Dental advisory given  Plan Discussed with: CRNA, Anesthesiologist and Surgeon  Anesthesia Plan Comments:        Anesthesia Quick Evaluation

## 2022-06-21 NOTE — Transfer of Care (Signed)
Immediate Anesthesia Transfer of Care Note  Patient: Bryan Sullivan  Procedure(s) Performed: GOLD SEED IMPLANT (Prostate) SPACE OAR INSTILLATION (Prostate)  Patient Location: PACU  Anesthesia Type:MAC  Level of Consciousness: awake, alert , oriented, and patient cooperative  Airway & Oxygen Therapy: Patient Spontanous Breathing  Post-op Assessment: Report given to RN and Post -op Vital signs reviewed and stable  Post vital signs: Reviewed and stable  Last Vitals:  Vitals Value Taken Time  BP 140/69 06/21/22 0753  Temp    Pulse 69 06/21/22 0757  Resp 13 06/21/22 0757  SpO2 97 % 06/21/22 0757  Vitals shown include unvalidated device data.  Last Pain:  Vitals:   06/21/22 0551  TempSrc: Oral         Complications: No notable events documented.

## 2022-06-21 NOTE — Op Note (Signed)
Preoperative diagnosis: Prostate cancer   Postoperative diagnosis: Prostate cancer   Procedure:  1) Fiducial marker placement into the prostate 2) Insertion of SpaceOAR hydrogel    Surgeon: Pryor Curia. M.D.   Anesthesia: IV sedation, Local anesthesia   EBL: Minimal   Complications: None   Indication: The potential risks, complications, alternative options, and expected recovery course associated with the above procedure(s) have been discussed in detail with the patient and he has provided informed consent to proceed.   Description of procedure: The patient was administered preoperative antibiotics, placed in the dorsal lithotomy position, and prepped and draped in the usual sterile fashion. A preoperative time out was performed.  Next, transrectal ultrasonography was utilized to visualize the prostate. 10 cc of 0.25% bupivacaine was then used to infiltrate the subcuateous tissue of the perineum and an additional 10 cc was injected into the lateral apical tissue surrounding the prostate for a periprostatic nerve block.  Three gold fiducial markers were then placed into the prostate via transperineal needles under ultrasound guidance at the left apex, left base, and right mid gland under direct ultrasound guidance.  A site in the midline was then selected on the perineum for placement of an 18 g needle with saline.  The needle was advanced above the rectum and below Denonvillier's fascia to the mid gland and confirmed to be in the midline on transverse imaging.  One cc of saline was injected confirming appropriate expansion of this space.  A total of 5-10 cc of saline was then injected to open the space further bilaterally.  The saline syringe was then removed and the SpaceOAR hydrogel was injected with good distribution bilaterally. He tolerated the procedure well and without complications. He was able to be awakened and transferred to the PACU in stable condition.

## 2022-06-21 NOTE — Progress Notes (Signed)
RN spoke with patients son to confirm he was aware of the upcoming [place] appointment on 2/1.  Informed son I was the prostate nurse navigator, and provided my direct contact for any future questions or concerns.  No barriers to care identified at this time.    Plan of care in progress.

## 2022-06-22 ENCOUNTER — Encounter (HOSPITAL_BASED_OUTPATIENT_CLINIC_OR_DEPARTMENT_OTHER): Payer: Self-pay | Admitting: Urology

## 2022-06-25 ENCOUNTER — Other Ambulatory Visit: Payer: Self-pay

## 2022-06-25 DIAGNOSIS — I2510 Atherosclerotic heart disease of native coronary artery without angina pectoris: Secondary | ICD-10-CM | POA: Diagnosis not present

## 2022-06-25 DIAGNOSIS — R0789 Other chest pain: Secondary | ICD-10-CM | POA: Diagnosis not present

## 2022-06-25 DIAGNOSIS — I10 Essential (primary) hypertension: Secondary | ICD-10-CM | POA: Diagnosis not present

## 2022-06-25 DIAGNOSIS — I4729 Other ventricular tachycardia: Secondary | ICD-10-CM | POA: Diagnosis not present

## 2022-06-25 DIAGNOSIS — I480 Paroxysmal atrial fibrillation: Secondary | ICD-10-CM | POA: Diagnosis not present

## 2022-06-25 DIAGNOSIS — R0602 Shortness of breath: Secondary | ICD-10-CM | POA: Diagnosis not present

## 2022-06-25 DIAGNOSIS — R002 Palpitations: Secondary | ICD-10-CM | POA: Diagnosis not present

## 2022-06-25 DIAGNOSIS — D649 Anemia, unspecified: Secondary | ICD-10-CM | POA: Diagnosis not present

## 2022-06-25 DIAGNOSIS — I471 Supraventricular tachycardia, unspecified: Secondary | ICD-10-CM | POA: Diagnosis not present

## 2022-06-26 ENCOUNTER — Other Ambulatory Visit: Payer: Self-pay

## 2022-06-26 ENCOUNTER — Telehealth: Payer: Self-pay | Admitting: *Deleted

## 2022-06-26 NOTE — Telephone Encounter (Signed)
CALLED PATIENT TO REMIND OF SIM APPT. FOR 06-28-22- ARRIVAL TIME- 10:45 AM @ [facility], INFORMED PATIENT TO ARRIVE WITH FULL BLADDER, SPOKE WITH PATIENT'S SON EIHAEB AND HE IS AWARE OF THIS APPT. AND THE INSTRUCTIONS

## 2022-06-27 NOTE — Progress Notes (Signed)
  Radiation Oncology         [phone] ________________________________  Name: Bryan Sullivan MRN: [id number]  Date: 06/28/2022  DOB: 07/10/1945  SIMULATION AND TREATMENT PLANNING NOTE    ICD-10-CM   1. Malignant neoplasm of prostate ([place])  C61       DIAGNOSIS:  77 y.o. gentleman with Stage T1c adenocarcinoma of the prostate with Gleason score of 4+4, and PSA of 5.15 (10.4 adjusted for finasteride).   NARRATIVE:  The patient was brought to the [facility].  Identity was confirmed.  All relevant records and images related to the planned course of therapy were reviewed.  The patient freely provided informed written consent to proceed with treatment after reviewing the details related to the planned course of therapy. The consent form was witnessed and verified by the simulation staff.  Then, the patient was set-up in a stable reproducible supine position for radiation therapy.  A vacuum lock pillow device was custom fabricated to position his legs in a reproducible immobilized position.  Then, I performed a urethrogram under sterile conditions to identify the prostatic apex.  CT images were obtained.  Surface markings were placed.  The CT images were loaded into the planning software.  Then the prostate target and avoidance structures including the rectum, bladder, bowel and hips were contoured.  Treatment planning then occurred.  The radiation prescription was entered and confirmed.  A total of one complex treatment devices was fabricated. I have requested : Intensity Modulated Radiotherapy (IMRT) is medically necessary for this case for the following reason:  Rectal sparing.Marland Kitchen  PLAN:   The prostate, seminal vesicles, and pelvic lymph nodes will initially be treated to 45 Gy in 25 fractions of 1.8 Gy followed by a boost to the prostate only, to 75 Gy with 15 additional fractions of 2.0 Gy   ________________________________  Sheral Apley Tammi Klippel, M.D.

## 2022-06-28 ENCOUNTER — Ambulatory Visit
Admission: RE | Admit: 2022-06-28 | Discharge: 2022-06-28 | Disposition: A | Discharge status: Home / Self Care | Payer: Medicare Other | Source: Ambulatory Visit | Attending: Radiation Oncology | Admitting: Radiation Oncology

## 2022-06-28 DIAGNOSIS — C61 Malignant neoplasm of prostate: Secondary | ICD-10-CM | POA: Diagnosis not present

## 2022-06-28 DIAGNOSIS — Z510 Encounter for antineoplastic radiation therapy: Secondary | ICD-10-CM | POA: Insufficient documentation

## 2022-06-28 DIAGNOSIS — Z191 Hormone sensitive malignancy status: Secondary | ICD-10-CM | POA: Diagnosis not present

## 2022-07-02 ENCOUNTER — Other Ambulatory Visit: Payer: Self-pay

## 2022-07-03 ENCOUNTER — Other Ambulatory Visit: Payer: Self-pay

## 2022-07-05 ENCOUNTER — Other Ambulatory Visit: Payer: Self-pay

## 2022-07-05 DIAGNOSIS — C61 Malignant neoplasm of prostate: Secondary | ICD-10-CM | POA: Diagnosis not present

## 2022-07-05 DIAGNOSIS — Z510 Encounter for antineoplastic radiation therapy: Secondary | ICD-10-CM | POA: Diagnosis not present

## 2022-07-05 DIAGNOSIS — Z191 Hormone sensitive malignancy status: Secondary | ICD-10-CM | POA: Diagnosis not present

## 2022-07-06 ENCOUNTER — Other Ambulatory Visit: Payer: Self-pay

## 2022-07-06 ENCOUNTER — Other Ambulatory Visit: Payer: Self-pay | Admitting: Nurse Practitioner

## 2022-07-06 DIAGNOSIS — J4489 Other specified chronic obstructive pulmonary disease: Secondary | ICD-10-CM

## 2022-07-06 MED ORDER — ALBUTEROL SULFATE HFA 108 (90 BASE) MCG/ACT IN AERS
2.0000 | INHALATION_SPRAY | RESPIRATORY_TRACT | 2 refills | Status: DC | PRN
  Filled 2022-07-06: qty 18, 17d supply, fill #0
  Filled 2022-09-11: qty 18, 17d supply, fill #1
  Filled 2022-10-23: qty 18, 17d supply, fill #2

## 2022-07-06 MED ORDER — BREZTRI AEROSPHERE 160-9-4.8 MCG/ACT IN AERO: 2.0000 | INHALATION_SPRAY | Freq: Two times a day (BID) | RESPIRATORY_TRACT | 0 refills | Status: DC

## 2022-07-09 ENCOUNTER — Other Ambulatory Visit: Payer: Self-pay

## 2022-07-09 MED ORDER — ATROPINE SULFATE 1 % OP SOLN
1.0000 [drp] | Freq: Every day | OPHTHALMIC | 6 refills | Status: DC | PRN
  Filled 2022-07-09: qty 5, 90d supply, fill #0
  Filled 2022-08-13: qty 5, 100d supply, fill #1
  Filled 2022-09-10: qty 5, 90d supply, fill #1
  Filled 2022-11-16: qty 5, 90d supply, fill #2
  Filled 2023-02-11: qty 5, 90d supply, fill #3
  Filled 2023-05-12: qty 5, 90d supply, fill #4
  Filled 2023-06-02: qty 5, 90d supply, fill #5

## 2022-07-10 ENCOUNTER — Ambulatory Visit
Admission: RE | Admit: 2022-07-10 | Discharge: 2022-07-10 | Disposition: A | Discharge status: Home / Self Care | Payer: Medicare Other | Source: Ambulatory Visit | Attending: Radiation Oncology | Admitting: Radiation Oncology

## 2022-07-10 ENCOUNTER — Other Ambulatory Visit: Payer: Self-pay

## 2022-07-10 DIAGNOSIS — C61 Malignant neoplasm of prostate: Secondary | ICD-10-CM

## 2022-07-10 DIAGNOSIS — Z510 Encounter for antineoplastic radiation therapy: Secondary | ICD-10-CM | POA: Diagnosis not present

## 2022-07-10 DIAGNOSIS — Z191 Hormone sensitive malignancy status: Secondary | ICD-10-CM | POA: Diagnosis not present

## 2022-07-10 LAB — RAD ONC ARIA SESSION SUMMARY
Course Elapsed Days: 0
Plan Fractions Treated to Date: 1
Plan Prescribed Dose Per Fraction: 1.8 Gy
Plan Total Fractions Prescribed: 25
Plan Total Prescribed Dose: 45 Gy
Reference Point Dosage Given to Date: 1.8 Gy
Reference Point Session Dosage Given: 1.8 Gy
Session Number: 1

## 2022-07-10 MED ORDER — ALBUTEROL SULFATE (2.5 MG/3ML) 0.083% IN NEBU
INHALATION_SOLUTION | RESPIRATORY_TRACT | 0 refills | Status: DC
  Filled 2022-07-10: qty 90, 15d supply, fill #0

## 2022-07-11 ENCOUNTER — Other Ambulatory Visit: Payer: Self-pay

## 2022-07-11 ENCOUNTER — Ambulatory Visit
Admission: RE | Admit: 2022-07-11 | Discharge: 2022-07-11 | Disposition: A | Discharge status: Home / Self Care | Payer: Medicare Other | Source: Ambulatory Visit | Attending: Radiation Oncology | Admitting: Radiation Oncology

## 2022-07-11 DIAGNOSIS — C61 Malignant neoplasm of prostate: Secondary | ICD-10-CM | POA: Diagnosis not present

## 2022-07-11 DIAGNOSIS — Z510 Encounter for antineoplastic radiation therapy: Secondary | ICD-10-CM | POA: Diagnosis not present

## 2022-07-11 DIAGNOSIS — Z191 Hormone sensitive malignancy status: Secondary | ICD-10-CM | POA: Diagnosis not present

## 2022-07-11 LAB — RAD ONC ARIA SESSION SUMMARY
Course Elapsed Days: 1
Plan Fractions Treated to Date: 2
Plan Prescribed Dose Per Fraction: 1.8 Gy
Plan Total Fractions Prescribed: 25
Plan Total Prescribed Dose: 45 Gy
Reference Point Dosage Given to Date: 3.6 Gy
Reference Point Session Dosage Given: 1.8 Gy
Session Number: 2

## 2022-07-12 ENCOUNTER — Ambulatory Visit
Admission: RE | Admit: 2022-07-12 | Discharge: 2022-07-12 | Disposition: A | Discharge status: Home / Self Care | Payer: Medicare Other | Source: Ambulatory Visit | Attending: Radiation Oncology | Admitting: Radiation Oncology

## 2022-07-12 ENCOUNTER — Other Ambulatory Visit: Payer: Self-pay

## 2022-07-12 DIAGNOSIS — Z510 Encounter for antineoplastic radiation therapy: Secondary | ICD-10-CM | POA: Diagnosis not present

## 2022-07-12 DIAGNOSIS — Z191 Hormone sensitive malignancy status: Secondary | ICD-10-CM | POA: Diagnosis not present

## 2022-07-12 DIAGNOSIS — C61 Malignant neoplasm of prostate: Secondary | ICD-10-CM | POA: Diagnosis not present

## 2022-07-12 LAB — RAD ONC ARIA SESSION SUMMARY
Course Elapsed Days: 2
Plan Fractions Treated to Date: 3
Plan Prescribed Dose Per Fraction: 1.8 Gy
Plan Total Fractions Prescribed: 25
Plan Total Prescribed Dose: 45 Gy
Reference Point Dosage Given to Date: 5.4 Gy
Reference Point Session Dosage Given: 1.8 Gy
Session Number: 3

## 2022-07-12 MED ORDER — ELIQUIS 5 MG PO TABS
5.0000 mg | ORAL_TABLET | Freq: Two times a day (BID) | ORAL | 5 refills | Status: DC
  Filled 2022-07-12: qty 60, 30d supply, fill #0
  Filled 2022-08-12: qty 60, 30d supply, fill #1

## 2022-07-12 NOTE — Progress Notes (Signed)
Pt here for patient teaching. Pt given Radiation and You booklet and skin care instructions.  Reviewed areas of pertinence such as diarrhea, fatigue, hair loss, nausea and vomiting, sexual and fertility changes, skin changes, and urinary and bladder changes. Pt able to give teach back of to pat skin, use unscented/gentle soap, use baby wipes, have Imodium on hand, drink plenty of water, and sitz bath, avoid applying anything to skin within 4 hours of treatment and to use an electric razor if they must shave. Pt verbalizes understanding of information given and will contact nursing with any questions or concerns.     [email or web address]

## 2022-07-13 ENCOUNTER — Ambulatory Visit
Admission: RE | Admit: 2022-07-13 | Discharge: 2022-07-13 | Disposition: A | Discharge status: Home / Self Care | Payer: Medicare Other | Source: Ambulatory Visit | Attending: Radiation Oncology | Admitting: Radiation Oncology

## 2022-07-13 ENCOUNTER — Ambulatory Visit: Payer: Medicare Other

## 2022-07-13 ENCOUNTER — Other Ambulatory Visit: Payer: Self-pay

## 2022-07-13 DIAGNOSIS — Z510 Encounter for antineoplastic radiation therapy: Secondary | ICD-10-CM | POA: Diagnosis not present

## 2022-07-13 DIAGNOSIS — Z191 Hormone sensitive malignancy status: Secondary | ICD-10-CM | POA: Diagnosis not present

## 2022-07-13 DIAGNOSIS — C61 Malignant neoplasm of prostate: Secondary | ICD-10-CM | POA: Diagnosis not present

## 2022-07-13 LAB — RAD ONC ARIA SESSION SUMMARY
Course Elapsed Days: 3
Plan Fractions Treated to Date: 4
Plan Prescribed Dose Per Fraction: 1.8 Gy
Plan Total Fractions Prescribed: 25
Plan Total Prescribed Dose: 45 Gy
Reference Point Dosage Given to Date: 7.2 Gy
Reference Point Session Dosage Given: 1.8 Gy
Session Number: 4

## 2022-07-16 ENCOUNTER — Other Ambulatory Visit: Payer: Self-pay

## 2022-07-16 ENCOUNTER — Ambulatory Visit
Admission: RE | Admit: 2022-07-16 | Discharge: 2022-07-16 | Disposition: A | Discharge status: Home / Self Care | Payer: Medicare Other | Source: Ambulatory Visit | Attending: Radiation Oncology | Admitting: Radiation Oncology

## 2022-07-16 DIAGNOSIS — Z510 Encounter for antineoplastic radiation therapy: Secondary | ICD-10-CM | POA: Diagnosis not present

## 2022-07-16 DIAGNOSIS — Z191 Hormone sensitive malignancy status: Secondary | ICD-10-CM | POA: Diagnosis not present

## 2022-07-16 DIAGNOSIS — C61 Malignant neoplasm of prostate: Secondary | ICD-10-CM | POA: Diagnosis not present

## 2022-07-16 LAB — RAD ONC ARIA SESSION SUMMARY
Course Elapsed Days: 6
Plan Fractions Treated to Date: 5
Plan Prescribed Dose Per Fraction: 1.8 Gy
Plan Total Fractions Prescribed: 25
Plan Total Prescribed Dose: 45 Gy
Reference Point Dosage Given to Date: 9 Gy
Reference Point Session Dosage Given: 1.8 Gy
Session Number: 5

## 2022-07-17 ENCOUNTER — Other Ambulatory Visit: Payer: Self-pay

## 2022-07-17 ENCOUNTER — Ambulatory Visit
Admission: RE | Admit: 2022-07-17 | Discharge: 2022-07-17 | Disposition: A | Discharge status: Home / Self Care | Payer: Medicare Other | Source: Ambulatory Visit | Attending: Radiation Oncology | Admitting: Radiation Oncology

## 2022-07-17 DIAGNOSIS — Z510 Encounter for antineoplastic radiation therapy: Secondary | ICD-10-CM | POA: Diagnosis not present

## 2022-07-17 DIAGNOSIS — Z191 Hormone sensitive malignancy status: Secondary | ICD-10-CM | POA: Diagnosis not present

## 2022-07-17 DIAGNOSIS — C61 Malignant neoplasm of prostate: Secondary | ICD-10-CM | POA: Diagnosis not present

## 2022-07-17 LAB — RAD ONC ARIA SESSION SUMMARY
Course Elapsed Days: 7
Plan Fractions Treated to Date: 6
Plan Prescribed Dose Per Fraction: 1.8 Gy
Plan Total Fractions Prescribed: 25
Plan Total Prescribed Dose: 45 Gy
Reference Point Dosage Given to Date: 10.8 Gy
Reference Point Session Dosage Given: 1.8 Gy
Session Number: 6

## 2022-07-18 ENCOUNTER — Other Ambulatory Visit: Payer: Self-pay

## 2022-07-18 ENCOUNTER — Ambulatory Visit
Admission: RE | Admit: 2022-07-18 | Discharge: 2022-07-18 | Disposition: A | Discharge status: Home / Self Care | Payer: Medicare Other | Source: Ambulatory Visit | Attending: Radiation Oncology | Admitting: Radiation Oncology

## 2022-07-18 DIAGNOSIS — C61 Malignant neoplasm of prostate: Secondary | ICD-10-CM | POA: Diagnosis not present

## 2022-07-18 DIAGNOSIS — Z510 Encounter for antineoplastic radiation therapy: Secondary | ICD-10-CM | POA: Diagnosis not present

## 2022-07-18 DIAGNOSIS — Z191 Hormone sensitive malignancy status: Secondary | ICD-10-CM | POA: Diagnosis not present

## 2022-07-18 LAB — RAD ONC ARIA SESSION SUMMARY
Course Elapsed Days: 8
Plan Fractions Treated to Date: 7
Plan Prescribed Dose Per Fraction: 1.8 Gy
Plan Total Fractions Prescribed: 25
Plan Total Prescribed Dose: 45 Gy
Reference Point Dosage Given to Date: 12.6 Gy
Reference Point Session Dosage Given: 1.8 Gy
Session Number: 7

## 2022-07-19 ENCOUNTER — Ambulatory Visit
Admission: RE | Admit: 2022-07-19 | Discharge: 2022-07-19 | Disposition: A | Discharge status: Home / Self Care | Payer: Medicare Other | Source: Ambulatory Visit | Attending: Radiation Oncology | Admitting: Radiation Oncology

## 2022-07-19 ENCOUNTER — Other Ambulatory Visit: Payer: Self-pay

## 2022-07-19 DIAGNOSIS — Z510 Encounter for antineoplastic radiation therapy: Secondary | ICD-10-CM | POA: Diagnosis not present

## 2022-07-19 DIAGNOSIS — Z191 Hormone sensitive malignancy status: Secondary | ICD-10-CM | POA: Diagnosis not present

## 2022-07-19 DIAGNOSIS — C61 Malignant neoplasm of prostate: Secondary | ICD-10-CM | POA: Diagnosis not present

## 2022-07-19 LAB — RAD ONC ARIA SESSION SUMMARY
Course Elapsed Days: 9
Plan Fractions Treated to Date: 8
Plan Prescribed Dose Per Fraction: 1.8 Gy
Plan Total Fractions Prescribed: 25
Plan Total Prescribed Dose: 45 Gy
Reference Point Dosage Given to Date: 14.4 Gy
Reference Point Session Dosage Given: 1.8 Gy
Session Number: 8

## 2022-07-20 ENCOUNTER — Ambulatory Visit
Admission: RE | Admit: 2022-07-20 | Discharge: 2022-07-20 | Disposition: A | Discharge status: Home / Self Care | Payer: Medicare Other | Source: Ambulatory Visit | Attending: Radiation Oncology | Admitting: Radiation Oncology

## 2022-07-20 ENCOUNTER — Other Ambulatory Visit: Payer: Self-pay

## 2022-07-20 DIAGNOSIS — Z510 Encounter for antineoplastic radiation therapy: Secondary | ICD-10-CM | POA: Diagnosis not present

## 2022-07-20 DIAGNOSIS — Z191 Hormone sensitive malignancy status: Secondary | ICD-10-CM | POA: Diagnosis not present

## 2022-07-20 DIAGNOSIS — C61 Malignant neoplasm of prostate: Secondary | ICD-10-CM | POA: Diagnosis not present

## 2022-07-20 LAB — RAD ONC ARIA SESSION SUMMARY
Course Elapsed Days: 10
Plan Fractions Treated to Date: 9
Plan Prescribed Dose Per Fraction: 1.8 Gy
Plan Total Fractions Prescribed: 25
Plan Total Prescribed Dose: 45 Gy
Reference Point Dosage Given to Date: 16.2 Gy
Reference Point Session Dosage Given: 1.8 Gy
Session Number: 9

## 2022-07-23 ENCOUNTER — Other Ambulatory Visit: Payer: Self-pay

## 2022-07-23 ENCOUNTER — Ambulatory Visit
Admission: RE | Admit: 2022-07-23 | Discharge: 2022-07-23 | Disposition: A | Discharge status: Home / Self Care | Payer: Medicare Other | Source: Ambulatory Visit | Attending: Radiation Oncology | Admitting: Radiation Oncology

## 2022-07-23 DIAGNOSIS — Z191 Hormone sensitive malignancy status: Secondary | ICD-10-CM | POA: Diagnosis not present

## 2022-07-23 DIAGNOSIS — Z510 Encounter for antineoplastic radiation therapy: Secondary | ICD-10-CM | POA: Diagnosis not present

## 2022-07-23 DIAGNOSIS — C61 Malignant neoplasm of prostate: Secondary | ICD-10-CM | POA: Diagnosis not present

## 2022-07-23 LAB — RAD ONC ARIA SESSION SUMMARY
Course Elapsed Days: 13
Plan Fractions Treated to Date: 10
Plan Prescribed Dose Per Fraction: 1.8 Gy
Plan Total Fractions Prescribed: 25
Plan Total Prescribed Dose: 45 Gy
Reference Point Dosage Given to Date: 18 Gy
Reference Point Session Dosage Given: 1.8 Gy
Session Number: 10

## 2022-07-24 ENCOUNTER — Other Ambulatory Visit: Payer: Self-pay

## 2022-07-24 ENCOUNTER — Ambulatory Visit
Admission: RE | Admit: 2022-07-24 | Discharge: 2022-07-24 | Disposition: A | Discharge status: Home / Self Care | Payer: Medicare Other | Source: Ambulatory Visit | Attending: Radiation Oncology | Admitting: Radiation Oncology

## 2022-07-24 DIAGNOSIS — R0602 Shortness of breath: Secondary | ICD-10-CM | POA: Diagnosis not present

## 2022-07-24 DIAGNOSIS — Z510 Encounter for antineoplastic radiation therapy: Secondary | ICD-10-CM | POA: Diagnosis not present

## 2022-07-24 DIAGNOSIS — C61 Malignant neoplasm of prostate: Secondary | ICD-10-CM | POA: Diagnosis not present

## 2022-07-24 DIAGNOSIS — Z191 Hormone sensitive malignancy status: Secondary | ICD-10-CM | POA: Diagnosis not present

## 2022-07-24 LAB — RAD ONC ARIA SESSION SUMMARY
Course Elapsed Days: 14
Plan Fractions Treated to Date: 11
Plan Prescribed Dose Per Fraction: 1.8 Gy
Plan Total Fractions Prescribed: 25
Plan Total Prescribed Dose: 45 Gy
Reference Point Dosage Given to Date: 19.8 Gy
Reference Point Session Dosage Given: 1.8 Gy
Session Number: 11

## 2022-07-25 ENCOUNTER — Ambulatory Visit
Admission: RE | Admit: 2022-07-25 | Discharge: 2022-07-25 | Disposition: A | Discharge status: Home / Self Care | Payer: Medicare Other | Source: Ambulatory Visit | Attending: Radiation Oncology | Admitting: Radiation Oncology

## 2022-07-25 ENCOUNTER — Other Ambulatory Visit: Payer: Self-pay

## 2022-07-25 DIAGNOSIS — Z191 Hormone sensitive malignancy status: Secondary | ICD-10-CM | POA: Diagnosis not present

## 2022-07-25 DIAGNOSIS — C61 Malignant neoplasm of prostate: Secondary | ICD-10-CM | POA: Diagnosis not present

## 2022-07-25 DIAGNOSIS — Z510 Encounter for antineoplastic radiation therapy: Secondary | ICD-10-CM | POA: Diagnosis not present

## 2022-07-25 LAB — RAD ONC ARIA SESSION SUMMARY
Course Elapsed Days: 15
Plan Fractions Treated to Date: 12
Plan Prescribed Dose Per Fraction: 1.8 Gy
Plan Total Fractions Prescribed: 25
Plan Total Prescribed Dose: 45 Gy
Reference Point Dosage Given to Date: 21.6 Gy
Reference Point Session Dosage Given: 1.8 Gy
Session Number: 12

## 2022-07-26 ENCOUNTER — Other Ambulatory Visit: Payer: Self-pay

## 2022-07-26 ENCOUNTER — Ambulatory Visit
Admission: RE | Admit: 2022-07-26 | Discharge: 2022-07-26 | Disposition: A | Discharge status: Home / Self Care | Payer: Medicare Other | Source: Ambulatory Visit | Attending: Radiation Oncology | Admitting: Radiation Oncology

## 2022-07-26 DIAGNOSIS — C61 Malignant neoplasm of prostate: Secondary | ICD-10-CM | POA: Diagnosis not present

## 2022-07-26 DIAGNOSIS — Z510 Encounter for antineoplastic radiation therapy: Secondary | ICD-10-CM | POA: Diagnosis not present

## 2022-07-26 DIAGNOSIS — Z191 Hormone sensitive malignancy status: Secondary | ICD-10-CM | POA: Diagnosis not present

## 2022-07-26 LAB — RAD ONC ARIA SESSION SUMMARY
Course Elapsed Days: 16
Plan Fractions Treated to Date: 13
Plan Prescribed Dose Per Fraction: 1.8 Gy
Plan Total Fractions Prescribed: 25
Plan Total Prescribed Dose: 45 Gy
Reference Point Dosage Given to Date: 23.4 Gy
Reference Point Session Dosage Given: 1.8 Gy
Session Number: 13

## 2022-07-27 ENCOUNTER — Ambulatory Visit
Admission: RE | Admit: 2022-07-27 | Discharge: 2022-07-27 | Disposition: A | Discharge status: Home / Self Care | Payer: Medicare Other | Source: Ambulatory Visit | Attending: Radiation Oncology | Admitting: Radiation Oncology

## 2022-07-27 ENCOUNTER — Other Ambulatory Visit: Payer: Self-pay

## 2022-07-27 DIAGNOSIS — Z191 Hormone sensitive malignancy status: Secondary | ICD-10-CM | POA: Diagnosis not present

## 2022-07-27 DIAGNOSIS — Z510 Encounter for antineoplastic radiation therapy: Secondary | ICD-10-CM | POA: Insufficient documentation

## 2022-07-27 DIAGNOSIS — C61 Malignant neoplasm of prostate: Secondary | ICD-10-CM | POA: Insufficient documentation

## 2022-07-27 LAB — RAD ONC ARIA SESSION SUMMARY
Course Elapsed Days: 17
Plan Fractions Treated to Date: 14
Plan Prescribed Dose Per Fraction: 1.8 Gy
Plan Total Fractions Prescribed: 25
Plan Total Prescribed Dose: 45 Gy
Reference Point Dosage Given to Date: 25.2 Gy
Reference Point Session Dosage Given: 1.8 Gy
Session Number: 14

## 2022-07-30 ENCOUNTER — Other Ambulatory Visit: Payer: Self-pay

## 2022-07-30 ENCOUNTER — Ambulatory Visit
Admission: RE | Admit: 2022-07-30 | Discharge: 2022-07-30 | Disposition: A | Discharge status: Home / Self Care | Payer: Medicare Other | Source: Ambulatory Visit | Attending: Radiation Oncology | Admitting: Radiation Oncology

## 2022-07-30 DIAGNOSIS — Z191 Hormone sensitive malignancy status: Secondary | ICD-10-CM | POA: Diagnosis not present

## 2022-07-30 DIAGNOSIS — H04123 Dry eye syndrome of bilateral lacrimal glands: Secondary | ICD-10-CM | POA: Diagnosis not present

## 2022-07-30 DIAGNOSIS — Z947 Corneal transplant status: Secondary | ICD-10-CM | POA: Diagnosis not present

## 2022-07-30 DIAGNOSIS — Z961 Presence of intraocular lens: Secondary | ICD-10-CM | POA: Diagnosis not present

## 2022-07-30 DIAGNOSIS — C61 Malignant neoplasm of prostate: Secondary | ICD-10-CM | POA: Diagnosis not present

## 2022-07-30 DIAGNOSIS — Z510 Encounter for antineoplastic radiation therapy: Secondary | ICD-10-CM | POA: Diagnosis not present

## 2022-07-30 DIAGNOSIS — H40023 Open angle with borderline findings, high risk, bilateral: Secondary | ICD-10-CM | POA: Diagnosis not present

## 2022-07-30 LAB — RAD ONC ARIA SESSION SUMMARY
Course Elapsed Days: 20
Plan Fractions Treated to Date: 15
Plan Prescribed Dose Per Fraction: 1.8 Gy
Plan Total Fractions Prescribed: 25
Plan Total Prescribed Dose: 45 Gy
Reference Point Dosage Given to Date: 27 Gy
Reference Point Session Dosage Given: 1.8 Gy
Session Number: 15

## 2022-07-31 ENCOUNTER — Ambulatory Visit
Admission: RE | Admit: 2022-07-31 | Discharge: 2022-07-31 | Disposition: A | Discharge status: Home / Self Care | Payer: Medicare Other | Source: Ambulatory Visit | Attending: Radiation Oncology | Admitting: Radiation Oncology

## 2022-07-31 ENCOUNTER — Other Ambulatory Visit: Payer: Self-pay

## 2022-07-31 DIAGNOSIS — C61 Malignant neoplasm of prostate: Secondary | ICD-10-CM | POA: Diagnosis not present

## 2022-07-31 DIAGNOSIS — Z191 Hormone sensitive malignancy status: Secondary | ICD-10-CM | POA: Diagnosis not present

## 2022-07-31 DIAGNOSIS — Z510 Encounter for antineoplastic radiation therapy: Secondary | ICD-10-CM | POA: Diagnosis not present

## 2022-07-31 LAB — RAD ONC ARIA SESSION SUMMARY
Course Elapsed Days: 21
Plan Fractions Treated to Date: 16
Plan Prescribed Dose Per Fraction: 1.8 Gy
Plan Total Fractions Prescribed: 25
Plan Total Prescribed Dose: 45 Gy
Reference Point Dosage Given to Date: 28.8 Gy
Reference Point Session Dosage Given: 1.8 Gy
Session Number: 16

## 2022-08-01 ENCOUNTER — Other Ambulatory Visit: Payer: Self-pay

## 2022-08-01 ENCOUNTER — Ambulatory Visit
Admission: RE | Admit: 2022-08-01 | Discharge: 2022-08-01 | Disposition: A | Discharge status: Home / Self Care | Payer: Medicare Other | Source: Ambulatory Visit | Attending: Radiation Oncology | Admitting: Radiation Oncology

## 2022-08-01 DIAGNOSIS — Z191 Hormone sensitive malignancy status: Secondary | ICD-10-CM | POA: Diagnosis not present

## 2022-08-01 DIAGNOSIS — Z510 Encounter for antineoplastic radiation therapy: Secondary | ICD-10-CM | POA: Diagnosis not present

## 2022-08-01 DIAGNOSIS — C61 Malignant neoplasm of prostate: Secondary | ICD-10-CM | POA: Diagnosis not present

## 2022-08-01 LAB — RAD ONC ARIA SESSION SUMMARY
Course Elapsed Days: 22
Plan Fractions Treated to Date: 17
Plan Prescribed Dose Per Fraction: 1.8 Gy
Plan Total Fractions Prescribed: 25
Plan Total Prescribed Dose: 45 Gy
Reference Point Dosage Given to Date: 30.6 Gy
Reference Point Session Dosage Given: 1.8 Gy
Session Number: 17

## 2022-08-02 ENCOUNTER — Other Ambulatory Visit: Payer: Self-pay

## 2022-08-02 ENCOUNTER — Ambulatory Visit
Admission: RE | Admit: 2022-08-02 | Discharge: 2022-08-02 | Disposition: A | Discharge status: Home / Self Care | Payer: Medicare Other | Source: Ambulatory Visit | Attending: Radiation Oncology | Admitting: Radiation Oncology

## 2022-08-02 DIAGNOSIS — Z510 Encounter for antineoplastic radiation therapy: Secondary | ICD-10-CM | POA: Diagnosis not present

## 2022-08-02 DIAGNOSIS — C61 Malignant neoplasm of prostate: Secondary | ICD-10-CM | POA: Diagnosis not present

## 2022-08-02 DIAGNOSIS — Z191 Hormone sensitive malignancy status: Secondary | ICD-10-CM | POA: Diagnosis not present

## 2022-08-02 LAB — RAD ONC ARIA SESSION SUMMARY
Course Elapsed Days: 23
Plan Fractions Treated to Date: 18
Plan Prescribed Dose Per Fraction: 1.8 Gy
Plan Total Fractions Prescribed: 25
Plan Total Prescribed Dose: 45 Gy
Reference Point Dosage Given to Date: 32.4 Gy
Reference Point Session Dosage Given: 1.8 Gy
Session Number: 18

## 2022-08-02 NOTE — Progress Notes (Signed)
  Radiation Oncology         [phone]) [phone] ________________________________  Name: Shakib Schoepf MRN: [id number]  Date: 08/02/2022  DOB: 1946/05/04  To whom it may concern:  I am writing this letter on behalf of the patient whose name appears on the letterhead.  He has been diagnosed with high risk prostate cancer and requires a series of treatments including daily radiation treatments for 8 weeks.  This is a challenging course of therapy associated with side effects including radiation cystitis, proctitis and fatigue.  His family in the [place] a has been very supportive in helping him tolerate treatment and travel to and from his daily treatments.  However, they require additional support and the intention of this letter is to support that the patient's daughter wishes to travel to the [place] in order to help support him during the completion of his treatment.  I think this is very appropriate and would help the patient achieve an ideal optimal outcome for his health.  I hope that you can see this and support this.  I am happy to answer any questions that he may have about his situation.  Enclosed, please find his pathology report documenting the diagnosis of cancer as well as my original consultation note, my treatment planning note outlining the prescribed course of therapy and a visit summary showing all of the upcoming appointments that he has through April 8.    Thank you in advance for your special consideration and compassion.    Sheral Apley Tammi Klippel, M.D.

## 2022-08-03 ENCOUNTER — Other Ambulatory Visit: Payer: Self-pay

## 2022-08-03 ENCOUNTER — Ambulatory Visit
Admission: RE | Admit: 2022-08-03 | Discharge: 2022-08-03 | Disposition: A | Discharge status: Home / Self Care | Payer: Medicare Other | Source: Ambulatory Visit | Attending: Radiation Oncology | Admitting: Radiation Oncology

## 2022-08-03 DIAGNOSIS — Z191 Hormone sensitive malignancy status: Secondary | ICD-10-CM | POA: Diagnosis not present

## 2022-08-03 DIAGNOSIS — Z510 Encounter for antineoplastic radiation therapy: Secondary | ICD-10-CM | POA: Diagnosis not present

## 2022-08-03 DIAGNOSIS — C61 Malignant neoplasm of prostate: Secondary | ICD-10-CM | POA: Diagnosis not present

## 2022-08-03 LAB — RAD ONC ARIA SESSION SUMMARY
Course Elapsed Days: 24
Plan Fractions Treated to Date: 19
Plan Prescribed Dose Per Fraction: 1.8 Gy
Plan Total Fractions Prescribed: 25
Plan Total Prescribed Dose: 45 Gy
Reference Point Dosage Given to Date: 34.2 Gy
Reference Point Session Dosage Given: 1.8 Gy
Session Number: 19

## 2022-08-06 ENCOUNTER — Other Ambulatory Visit: Payer: Self-pay

## 2022-08-06 ENCOUNTER — Ambulatory Visit
Admission: RE | Admit: 2022-08-06 | Discharge: 2022-08-06 | Disposition: A | Discharge status: Home / Self Care | Payer: Medicare Other | Source: Ambulatory Visit | Attending: Radiation Oncology | Admitting: Radiation Oncology

## 2022-08-06 DIAGNOSIS — J189 Pneumonia, unspecified organism: Secondary | ICD-10-CM | POA: Diagnosis not present

## 2022-08-06 DIAGNOSIS — Z510 Encounter for antineoplastic radiation therapy: Secondary | ICD-10-CM | POA: Diagnosis not present

## 2022-08-06 DIAGNOSIS — I2510 Atherosclerotic heart disease of native coronary artery without angina pectoris: Secondary | ICD-10-CM | POA: Diagnosis not present

## 2022-08-06 DIAGNOSIS — J4520 Mild intermittent asthma, uncomplicated: Secondary | ICD-10-CM | POA: Diagnosis not present

## 2022-08-06 DIAGNOSIS — E559 Vitamin D deficiency, unspecified: Secondary | ICD-10-CM | POA: Diagnosis not present

## 2022-08-06 DIAGNOSIS — R030 Elevated blood-pressure reading, without diagnosis of hypertension: Secondary | ICD-10-CM | POA: Diagnosis not present

## 2022-08-06 DIAGNOSIS — K219 Gastro-esophageal reflux disease without esophagitis: Secondary | ICD-10-CM | POA: Diagnosis not present

## 2022-08-06 DIAGNOSIS — E789 Disorder of lipoprotein metabolism, unspecified: Secondary | ICD-10-CM | POA: Diagnosis not present

## 2022-08-06 DIAGNOSIS — Z87438 Personal history of other diseases of male genital organs: Secondary | ICD-10-CM | POA: Diagnosis not present

## 2022-08-06 DIAGNOSIS — Z191 Hormone sensitive malignancy status: Secondary | ICD-10-CM | POA: Diagnosis not present

## 2022-08-06 DIAGNOSIS — C61 Malignant neoplasm of prostate: Secondary | ICD-10-CM | POA: Diagnosis not present

## 2022-08-06 DIAGNOSIS — R7303 Prediabetes: Secondary | ICD-10-CM | POA: Diagnosis not present

## 2022-08-06 DIAGNOSIS — R591 Generalized enlarged lymph nodes: Secondary | ICD-10-CM | POA: Diagnosis not present

## 2022-08-06 LAB — RAD ONC ARIA SESSION SUMMARY
Course Elapsed Days: 27
Plan Fractions Treated to Date: 20
Plan Prescribed Dose Per Fraction: 1.8 Gy
Plan Total Fractions Prescribed: 25
Plan Total Prescribed Dose: 45 Gy
Reference Point Dosage Given to Date: 36 Gy
Reference Point Session Dosage Given: 1.8 Gy
Session Number: 20

## 2022-08-06 MED ORDER — OMEPRAZOLE 20 MG PO CPDR
20.0000 mg | DELAYED_RELEASE_CAPSULE | Freq: Every day | ORAL | 1 refills | Status: DC
  Filled 2022-08-06 – 2022-08-22 (×2): qty 90, 90d supply, fill #0
  Filled 2022-12-03 – 2022-12-19 (×2): qty 90, 90d supply, fill #1

## 2022-08-06 MED ORDER — ALBUTEROL SULFATE (2.5 MG/3ML) 0.083% IN NEBU
2.5000 mg | INHALATION_SOLUTION | RESPIRATORY_TRACT | 1 refills | Status: DC | PRN
  Filled 2022-08-06: qty 90, 15d supply, fill #0

## 2022-08-07 ENCOUNTER — Other Ambulatory Visit: Payer: Self-pay

## 2022-08-07 ENCOUNTER — Ambulatory Visit
Admission: RE | Admit: 2022-08-07 | Discharge: 2022-08-07 | Disposition: A | Discharge status: Home / Self Care | Payer: Medicare Other | Source: Ambulatory Visit | Attending: Radiation Oncology | Admitting: Radiation Oncology

## 2022-08-07 DIAGNOSIS — Z510 Encounter for antineoplastic radiation therapy: Secondary | ICD-10-CM | POA: Diagnosis not present

## 2022-08-07 DIAGNOSIS — C61 Malignant neoplasm of prostate: Secondary | ICD-10-CM | POA: Diagnosis not present

## 2022-08-07 DIAGNOSIS — Z191 Hormone sensitive malignancy status: Secondary | ICD-10-CM | POA: Diagnosis not present

## 2022-08-07 LAB — RAD ONC ARIA SESSION SUMMARY
Course Elapsed Days: 28
Plan Fractions Treated to Date: 21
Plan Prescribed Dose Per Fraction: 1.8 Gy
Plan Total Fractions Prescribed: 25
Plan Total Prescribed Dose: 45 Gy
Reference Point Dosage Given to Date: 37.8 Gy
Reference Point Session Dosage Given: 1.8 Gy
Session Number: 21

## 2022-08-08 ENCOUNTER — Other Ambulatory Visit: Payer: Self-pay

## 2022-08-08 ENCOUNTER — Ambulatory Visit
Admission: RE | Admit: 2022-08-08 | Discharge: 2022-08-08 | Disposition: A | Discharge status: Home / Self Care | Payer: Medicare Other | Source: Ambulatory Visit | Attending: Radiation Oncology | Admitting: Radiation Oncology

## 2022-08-08 DIAGNOSIS — C61 Malignant neoplasm of prostate: Secondary | ICD-10-CM | POA: Diagnosis not present

## 2022-08-08 DIAGNOSIS — Z191 Hormone sensitive malignancy status: Secondary | ICD-10-CM | POA: Diagnosis not present

## 2022-08-08 DIAGNOSIS — Z510 Encounter for antineoplastic radiation therapy: Secondary | ICD-10-CM | POA: Diagnosis not present

## 2022-08-08 LAB — RAD ONC ARIA SESSION SUMMARY
Course Elapsed Days: 29
Plan Fractions Treated to Date: 22
Plan Prescribed Dose Per Fraction: 1.8 Gy
Plan Total Fractions Prescribed: 25
Plan Total Prescribed Dose: 45 Gy
Reference Point Dosage Given to Date: 39.6 Gy
Reference Point Session Dosage Given: 1.8 Gy
Session Number: 22

## 2022-08-09 ENCOUNTER — Ambulatory Visit
Admission: RE | Admit: 2022-08-09 | Discharge: 2022-08-09 | Disposition: A | Discharge status: Home / Self Care | Payer: Medicare Other | Source: Ambulatory Visit | Attending: Radiation Oncology | Admitting: Radiation Oncology

## 2022-08-09 ENCOUNTER — Other Ambulatory Visit: Payer: Self-pay

## 2022-08-09 DIAGNOSIS — Z191 Hormone sensitive malignancy status: Secondary | ICD-10-CM | POA: Diagnosis not present

## 2022-08-09 DIAGNOSIS — Z510 Encounter for antineoplastic radiation therapy: Secondary | ICD-10-CM | POA: Diagnosis not present

## 2022-08-09 DIAGNOSIS — C61 Malignant neoplasm of prostate: Secondary | ICD-10-CM | POA: Diagnosis not present

## 2022-08-09 LAB — RAD ONC ARIA SESSION SUMMARY
Course Elapsed Days: 30
Plan Fractions Treated to Date: 23
Plan Prescribed Dose Per Fraction: 1.8 Gy
Plan Total Fractions Prescribed: 25
Plan Total Prescribed Dose: 45 Gy
Reference Point Dosage Given to Date: 41.4 Gy
Reference Point Session Dosage Given: 1.8 Gy
Session Number: 23

## 2022-08-10 ENCOUNTER — Other Ambulatory Visit: Payer: Self-pay

## 2022-08-10 ENCOUNTER — Ambulatory Visit
Admission: RE | Admit: 2022-08-10 | Discharge: 2022-08-10 | Disposition: A | Discharge status: Home / Self Care | Payer: Medicare Other | Source: Ambulatory Visit | Attending: Radiation Oncology | Admitting: Radiation Oncology

## 2022-08-10 DIAGNOSIS — C61 Malignant neoplasm of prostate: Secondary | ICD-10-CM | POA: Diagnosis not present

## 2022-08-10 DIAGNOSIS — Z191 Hormone sensitive malignancy status: Secondary | ICD-10-CM | POA: Diagnosis not present

## 2022-08-10 DIAGNOSIS — Z510 Encounter for antineoplastic radiation therapy: Secondary | ICD-10-CM | POA: Diagnosis not present

## 2022-08-10 LAB — RAD ONC ARIA SESSION SUMMARY
Course Elapsed Days: 31
Plan Fractions Treated to Date: 24
Plan Prescribed Dose Per Fraction: 1.8 Gy
Plan Total Fractions Prescribed: 25
Plan Total Prescribed Dose: 45 Gy
Reference Point Dosage Given to Date: 43.2 Gy
Reference Point Session Dosage Given: 1.8 Gy
Session Number: 24

## 2022-08-13 ENCOUNTER — Other Ambulatory Visit: Payer: Self-pay

## 2022-08-13 ENCOUNTER — Ambulatory Visit
Admission: RE | Admit: 2022-08-13 | Discharge: 2022-08-13 | Disposition: A | Discharge status: Home / Self Care | Payer: Medicare Other | Source: Ambulatory Visit | Attending: Radiation Oncology | Admitting: Radiation Oncology

## 2022-08-13 DIAGNOSIS — C61 Malignant neoplasm of prostate: Secondary | ICD-10-CM | POA: Diagnosis not present

## 2022-08-13 DIAGNOSIS — Z510 Encounter for antineoplastic radiation therapy: Secondary | ICD-10-CM | POA: Diagnosis not present

## 2022-08-13 DIAGNOSIS — Z191 Hormone sensitive malignancy status: Secondary | ICD-10-CM | POA: Diagnosis not present

## 2022-08-13 LAB — RAD ONC ARIA SESSION SUMMARY
Course Elapsed Days: 34
Plan Fractions Treated to Date: 25
Plan Prescribed Dose Per Fraction: 1.8 Gy
Plan Total Fractions Prescribed: 25
Plan Total Prescribed Dose: 45 Gy
Reference Point Dosage Given to Date: 45 Gy
Reference Point Session Dosage Given: 1.8 Gy
Session Number: 25

## 2022-08-14 ENCOUNTER — Ambulatory Visit
Admission: RE | Admit: 2022-08-14 | Discharge: 2022-08-14 | Disposition: A | Discharge status: Home / Self Care | Payer: Medicare Other | Source: Ambulatory Visit | Attending: Radiation Oncology | Admitting: Radiation Oncology

## 2022-08-14 ENCOUNTER — Other Ambulatory Visit: Payer: Self-pay

## 2022-08-14 DIAGNOSIS — Z191 Hormone sensitive malignancy status: Secondary | ICD-10-CM | POA: Diagnosis not present

## 2022-08-14 DIAGNOSIS — C61 Malignant neoplasm of prostate: Secondary | ICD-10-CM | POA: Diagnosis not present

## 2022-08-14 DIAGNOSIS — Z510 Encounter for antineoplastic radiation therapy: Secondary | ICD-10-CM | POA: Diagnosis not present

## 2022-08-14 LAB — RAD ONC ARIA SESSION SUMMARY
Course Elapsed Days: 35
Plan Fractions Treated to Date: 1
Plan Prescribed Dose Per Fraction: 2 Gy
Plan Total Fractions Prescribed: 15
Plan Total Prescribed Dose: 30 Gy
Reference Point Dosage Given to Date: 2 Gy
Reference Point Session Dosage Given: 2 Gy
Session Number: 26

## 2022-08-15 ENCOUNTER — Ambulatory Visit
Admission: RE | Admit: 2022-08-15 | Discharge: 2022-08-15 | Disposition: A | Discharge status: Home / Self Care | Payer: Medicare Other | Source: Ambulatory Visit | Attending: Radiation Oncology | Admitting: Radiation Oncology

## 2022-08-15 ENCOUNTER — Other Ambulatory Visit: Payer: Self-pay

## 2022-08-15 DIAGNOSIS — C61 Malignant neoplasm of prostate: Secondary | ICD-10-CM | POA: Diagnosis not present

## 2022-08-15 DIAGNOSIS — Z510 Encounter for antineoplastic radiation therapy: Secondary | ICD-10-CM | POA: Diagnosis not present

## 2022-08-15 DIAGNOSIS — Z191 Hormone sensitive malignancy status: Secondary | ICD-10-CM | POA: Diagnosis not present

## 2022-08-15 LAB — RAD ONC ARIA SESSION SUMMARY
Course Elapsed Days: 36
Plan Fractions Treated to Date: 2
Plan Prescribed Dose Per Fraction: 2 Gy
Plan Total Fractions Prescribed: 15
Plan Total Prescribed Dose: 30 Gy
Reference Point Dosage Given to Date: 4 Gy
Reference Point Session Dosage Given: 2 Gy
Session Number: 27

## 2022-08-16 ENCOUNTER — Other Ambulatory Visit: Payer: Self-pay

## 2022-08-16 ENCOUNTER — Ambulatory Visit
Admission: RE | Admit: 2022-08-16 | Discharge: 2022-08-16 | Disposition: A | Discharge status: Home / Self Care | Payer: Medicare Other | Source: Ambulatory Visit | Attending: Radiation Oncology | Admitting: Radiation Oncology

## 2022-08-16 DIAGNOSIS — Z191 Hormone sensitive malignancy status: Secondary | ICD-10-CM | POA: Diagnosis not present

## 2022-08-16 DIAGNOSIS — C61 Malignant neoplasm of prostate: Secondary | ICD-10-CM | POA: Diagnosis not present

## 2022-08-16 DIAGNOSIS — Z510 Encounter for antineoplastic radiation therapy: Secondary | ICD-10-CM | POA: Diagnosis not present

## 2022-08-16 LAB — RAD ONC ARIA SESSION SUMMARY
Course Elapsed Days: 37
Plan Fractions Treated to Date: 3
Plan Prescribed Dose Per Fraction: 2 Gy
Plan Total Fractions Prescribed: 15
Plan Total Prescribed Dose: 30 Gy
Reference Point Dosage Given to Date: 6 Gy
Reference Point Session Dosage Given: 2 Gy
Session Number: 28

## 2022-08-17 ENCOUNTER — Other Ambulatory Visit: Payer: Self-pay

## 2022-08-17 ENCOUNTER — Ambulatory Visit
Admission: RE | Admit: 2022-08-17 | Discharge: 2022-08-17 | Disposition: A | Discharge status: Home / Self Care | Payer: Medicare Other | Source: Ambulatory Visit | Attending: Radiation Oncology | Admitting: Radiation Oncology

## 2022-08-17 DIAGNOSIS — Z191 Hormone sensitive malignancy status: Secondary | ICD-10-CM | POA: Diagnosis not present

## 2022-08-17 DIAGNOSIS — Z510 Encounter for antineoplastic radiation therapy: Secondary | ICD-10-CM | POA: Diagnosis not present

## 2022-08-17 DIAGNOSIS — C61 Malignant neoplasm of prostate: Secondary | ICD-10-CM | POA: Diagnosis not present

## 2022-08-17 LAB — RAD ONC ARIA SESSION SUMMARY
Course Elapsed Days: 38
Plan Fractions Treated to Date: 4
Plan Prescribed Dose Per Fraction: 2 Gy
Plan Total Fractions Prescribed: 15
Plan Total Prescribed Dose: 30 Gy
Reference Point Dosage Given to Date: 8 Gy
Reference Point Session Dosage Given: 2 Gy
Session Number: 29

## 2022-08-20 ENCOUNTER — Ambulatory Visit
Admission: RE | Admit: 2022-08-20 | Discharge: 2022-08-20 | Disposition: A | Discharge status: Home / Self Care | Payer: Medicare Other | Source: Ambulatory Visit | Attending: Radiation Oncology | Admitting: Radiation Oncology

## 2022-08-20 ENCOUNTER — Other Ambulatory Visit: Payer: Self-pay

## 2022-08-20 DIAGNOSIS — Z191 Hormone sensitive malignancy status: Secondary | ICD-10-CM | POA: Diagnosis not present

## 2022-08-20 DIAGNOSIS — Z510 Encounter for antineoplastic radiation therapy: Secondary | ICD-10-CM | POA: Diagnosis not present

## 2022-08-20 DIAGNOSIS — C61 Malignant neoplasm of prostate: Secondary | ICD-10-CM | POA: Diagnosis not present

## 2022-08-20 LAB — RAD ONC ARIA SESSION SUMMARY
Course Elapsed Days: 41
Plan Fractions Treated to Date: 5
Plan Prescribed Dose Per Fraction: 2 Gy
Plan Total Fractions Prescribed: 15
Plan Total Prescribed Dose: 30 Gy
Reference Point Dosage Given to Date: 10 Gy
Reference Point Session Dosage Given: 2 Gy
Session Number: 30

## 2022-08-21 ENCOUNTER — Other Ambulatory Visit: Payer: Self-pay

## 2022-08-21 ENCOUNTER — Ambulatory Visit
Admission: RE | Admit: 2022-08-21 | Discharge: 2022-08-21 | Disposition: A | Discharge status: Home / Self Care | Payer: Medicare Other | Source: Ambulatory Visit | Attending: Radiation Oncology | Admitting: Radiation Oncology

## 2022-08-21 DIAGNOSIS — Z510 Encounter for antineoplastic radiation therapy: Secondary | ICD-10-CM | POA: Diagnosis not present

## 2022-08-21 DIAGNOSIS — C61 Malignant neoplasm of prostate: Secondary | ICD-10-CM | POA: Diagnosis not present

## 2022-08-21 DIAGNOSIS — Z191 Hormone sensitive malignancy status: Secondary | ICD-10-CM | POA: Diagnosis not present

## 2022-08-21 LAB — RAD ONC ARIA SESSION SUMMARY
Course Elapsed Days: 42
Plan Fractions Treated to Date: 6
Plan Prescribed Dose Per Fraction: 2 Gy
Plan Total Fractions Prescribed: 15
Plan Total Prescribed Dose: 30 Gy
Reference Point Dosage Given to Date: 12 Gy
Reference Point Session Dosage Given: 2 Gy
Session Number: 31

## 2022-08-22 ENCOUNTER — Ambulatory Visit
Admission: RE | Admit: 2022-08-22 | Discharge: 2022-08-22 | Disposition: A | Discharge status: Home / Self Care | Payer: Medicare Other | Source: Ambulatory Visit | Attending: Radiation Oncology | Admitting: Radiation Oncology

## 2022-08-22 ENCOUNTER — Other Ambulatory Visit: Payer: Self-pay

## 2022-08-22 DIAGNOSIS — C61 Malignant neoplasm of prostate: Secondary | ICD-10-CM | POA: Diagnosis not present

## 2022-08-22 DIAGNOSIS — Z191 Hormone sensitive malignancy status: Secondary | ICD-10-CM | POA: Diagnosis not present

## 2022-08-22 DIAGNOSIS — Z510 Encounter for antineoplastic radiation therapy: Secondary | ICD-10-CM | POA: Diagnosis not present

## 2022-08-22 LAB — RAD ONC ARIA SESSION SUMMARY
Course Elapsed Days: 43
Plan Fractions Treated to Date: 7
Plan Prescribed Dose Per Fraction: 2 Gy
Plan Total Fractions Prescribed: 15
Plan Total Prescribed Dose: 30 Gy
Reference Point Dosage Given to Date: 14 Gy
Reference Point Session Dosage Given: 2 Gy
Session Number: 32

## 2022-08-23 ENCOUNTER — Other Ambulatory Visit: Payer: Self-pay

## 2022-08-23 ENCOUNTER — Ambulatory Visit
Admission: RE | Admit: 2022-08-23 | Discharge: 2022-08-23 | Disposition: A | Discharge status: Home / Self Care | Payer: Medicare Other | Source: Ambulatory Visit | Attending: Radiation Oncology | Admitting: Radiation Oncology

## 2022-08-23 DIAGNOSIS — C61 Malignant neoplasm of prostate: Secondary | ICD-10-CM | POA: Diagnosis not present

## 2022-08-23 DIAGNOSIS — Z191 Hormone sensitive malignancy status: Secondary | ICD-10-CM | POA: Diagnosis not present

## 2022-08-23 DIAGNOSIS — Z510 Encounter for antineoplastic radiation therapy: Secondary | ICD-10-CM | POA: Diagnosis not present

## 2022-08-23 LAB — RAD ONC ARIA SESSION SUMMARY
Course Elapsed Days: 44
Plan Fractions Treated to Date: 8
Plan Prescribed Dose Per Fraction: 2 Gy
Plan Total Fractions Prescribed: 15
Plan Total Prescribed Dose: 30 Gy
Reference Point Dosage Given to Date: 16 Gy
Reference Point Session Dosage Given: 2 Gy
Session Number: 33

## 2022-08-24 ENCOUNTER — Ambulatory Visit
Admission: RE | Admit: 2022-08-24 | Discharge: 2022-08-24 | Disposition: A | Discharge status: Home / Self Care | Payer: Medicare Other | Source: Ambulatory Visit | Attending: Radiation Oncology | Admitting: Radiation Oncology

## 2022-08-24 ENCOUNTER — Other Ambulatory Visit: Payer: Self-pay

## 2022-08-24 DIAGNOSIS — Z510 Encounter for antineoplastic radiation therapy: Secondary | ICD-10-CM | POA: Diagnosis not present

## 2022-08-24 DIAGNOSIS — C61 Malignant neoplasm of prostate: Secondary | ICD-10-CM | POA: Diagnosis not present

## 2022-08-24 DIAGNOSIS — Z191 Hormone sensitive malignancy status: Secondary | ICD-10-CM | POA: Diagnosis not present

## 2022-08-24 LAB — RAD ONC ARIA SESSION SUMMARY
Course Elapsed Days: 45
Plan Fractions Treated to Date: 9
Plan Prescribed Dose Per Fraction: 2 Gy
Plan Total Fractions Prescribed: 15
Plan Total Prescribed Dose: 30 Gy
Reference Point Dosage Given to Date: 18 Gy
Reference Point Session Dosage Given: 2 Gy
Session Number: 34

## 2022-08-27 ENCOUNTER — Other Ambulatory Visit: Payer: Self-pay

## 2022-08-27 ENCOUNTER — Ambulatory Visit
Admission: RE | Admit: 2022-08-27 | Discharge: 2022-08-27 | Disposition: A | Discharge status: Home / Self Care | Payer: Medicare Other | Source: Ambulatory Visit | Attending: Radiation Oncology | Admitting: Radiation Oncology

## 2022-08-27 DIAGNOSIS — C61 Malignant neoplasm of prostate: Secondary | ICD-10-CM | POA: Diagnosis not present

## 2022-08-27 DIAGNOSIS — Z510 Encounter for antineoplastic radiation therapy: Secondary | ICD-10-CM | POA: Diagnosis not present

## 2022-08-27 DIAGNOSIS — Z191 Hormone sensitive malignancy status: Secondary | ICD-10-CM | POA: Diagnosis not present

## 2022-08-27 LAB — RAD ONC ARIA SESSION SUMMARY
Course Elapsed Days: 48
Plan Fractions Treated to Date: 10
Plan Prescribed Dose Per Fraction: 2 Gy
Plan Total Fractions Prescribed: 15
Plan Total Prescribed Dose: 30 Gy
Reference Point Dosage Given to Date: 20 Gy
Reference Point Session Dosage Given: 2 Gy
Session Number: 35

## 2022-08-28 ENCOUNTER — Other Ambulatory Visit: Payer: Self-pay

## 2022-08-28 ENCOUNTER — Ambulatory Visit
Admission: RE | Admit: 2022-08-28 | Discharge: 2022-08-28 | Disposition: A | Discharge status: Home / Self Care | Payer: Medicare Other | Source: Ambulatory Visit | Attending: Radiation Oncology | Admitting: Radiation Oncology

## 2022-08-28 DIAGNOSIS — Z191 Hormone sensitive malignancy status: Secondary | ICD-10-CM | POA: Diagnosis not present

## 2022-08-28 DIAGNOSIS — Z510 Encounter for antineoplastic radiation therapy: Secondary | ICD-10-CM | POA: Diagnosis not present

## 2022-08-28 DIAGNOSIS — C61 Malignant neoplasm of prostate: Secondary | ICD-10-CM | POA: Diagnosis not present

## 2022-08-28 LAB — RAD ONC ARIA SESSION SUMMARY
Course Elapsed Days: 49
Plan Fractions Treated to Date: 11
Plan Prescribed Dose Per Fraction: 2 Gy
Plan Total Fractions Prescribed: 15
Plan Total Prescribed Dose: 30 Gy
Reference Point Dosage Given to Date: 22 Gy
Reference Point Session Dosage Given: 2 Gy
Session Number: 36

## 2022-08-29 ENCOUNTER — Ambulatory Visit
Admission: RE | Admit: 2022-08-29 | Discharge: 2022-08-29 | Disposition: A | Discharge status: Home / Self Care | Payer: Medicare Other | Source: Ambulatory Visit | Attending: Radiation Oncology | Admitting: Radiation Oncology

## 2022-08-29 ENCOUNTER — Other Ambulatory Visit: Payer: Self-pay

## 2022-08-29 DIAGNOSIS — Z191 Hormone sensitive malignancy status: Secondary | ICD-10-CM | POA: Diagnosis not present

## 2022-08-29 DIAGNOSIS — Z510 Encounter for antineoplastic radiation therapy: Secondary | ICD-10-CM | POA: Diagnosis not present

## 2022-08-29 DIAGNOSIS — C61 Malignant neoplasm of prostate: Secondary | ICD-10-CM | POA: Diagnosis not present

## 2022-08-29 LAB — RAD ONC ARIA SESSION SUMMARY
Course Elapsed Days: 50
Plan Fractions Treated to Date: 12
Plan Prescribed Dose Per Fraction: 2 Gy
Plan Total Fractions Prescribed: 15
Plan Total Prescribed Dose: 30 Gy
Reference Point Dosage Given to Date: 24 Gy
Reference Point Session Dosage Given: 2 Gy
Session Number: 37

## 2022-08-30 ENCOUNTER — Other Ambulatory Visit: Payer: Self-pay

## 2022-08-30 ENCOUNTER — Ambulatory Visit
Admission: RE | Admit: 2022-08-30 | Discharge: 2022-08-30 | Disposition: A | Discharge status: Home / Self Care | Payer: Medicare Other | Source: Ambulatory Visit | Attending: Radiation Oncology | Admitting: Radiation Oncology

## 2022-08-30 DIAGNOSIS — C61 Malignant neoplasm of prostate: Secondary | ICD-10-CM | POA: Diagnosis not present

## 2022-08-30 DIAGNOSIS — Z510 Encounter for antineoplastic radiation therapy: Secondary | ICD-10-CM | POA: Diagnosis not present

## 2022-08-30 DIAGNOSIS — Z191 Hormone sensitive malignancy status: Secondary | ICD-10-CM | POA: Diagnosis not present

## 2022-08-30 LAB — RAD ONC ARIA SESSION SUMMARY
Course Elapsed Days: 51
Plan Fractions Treated to Date: 13
Plan Prescribed Dose Per Fraction: 2 Gy
Plan Total Fractions Prescribed: 15
Plan Total Prescribed Dose: 30 Gy
Reference Point Dosage Given to Date: 26 Gy
Reference Point Session Dosage Given: 2 Gy
Session Number: 38

## 2022-08-31 ENCOUNTER — Ambulatory Visit
Admission: RE | Admit: 2022-08-31 | Discharge: 2022-08-31 | Disposition: A | Discharge status: Home / Self Care | Payer: Medicare Other | Source: Ambulatory Visit | Attending: Radiation Oncology | Admitting: Radiation Oncology

## 2022-08-31 ENCOUNTER — Other Ambulatory Visit: Payer: Self-pay

## 2022-08-31 DIAGNOSIS — Z510 Encounter for antineoplastic radiation therapy: Secondary | ICD-10-CM | POA: Diagnosis not present

## 2022-08-31 DIAGNOSIS — C61 Malignant neoplasm of prostate: Secondary | ICD-10-CM | POA: Diagnosis not present

## 2022-08-31 DIAGNOSIS — Z191 Hormone sensitive malignancy status: Secondary | ICD-10-CM | POA: Diagnosis not present

## 2022-08-31 LAB — RAD ONC ARIA SESSION SUMMARY
Course Elapsed Days: 52
Plan Fractions Treated to Date: 14
Plan Prescribed Dose Per Fraction: 2 Gy
Plan Total Fractions Prescribed: 15
Plan Total Prescribed Dose: 30 Gy
Reference Point Dosage Given to Date: 28 Gy
Reference Point Session Dosage Given: 2 Gy
Session Number: 39

## 2022-09-03 ENCOUNTER — Encounter: Payer: Self-pay | Admitting: Urology

## 2022-09-03 ENCOUNTER — Ambulatory Visit
Admission: RE | Admit: 2022-09-03 | Discharge: 2022-09-03 | Disposition: A | Discharge status: Home / Self Care | Payer: Medicare Other | Source: Ambulatory Visit | Attending: Radiation Oncology | Admitting: Radiation Oncology

## 2022-09-03 ENCOUNTER — Other Ambulatory Visit: Payer: Self-pay

## 2022-09-03 DIAGNOSIS — C61 Malignant neoplasm of prostate: Secondary | ICD-10-CM | POA: Diagnosis not present

## 2022-09-03 DIAGNOSIS — Z191 Hormone sensitive malignancy status: Secondary | ICD-10-CM | POA: Diagnosis not present

## 2022-09-03 DIAGNOSIS — Z510 Encounter for antineoplastic radiation therapy: Secondary | ICD-10-CM | POA: Diagnosis not present

## 2022-09-03 LAB — RAD ONC ARIA SESSION SUMMARY
Course Elapsed Days: 55
Plan Fractions Treated to Date: 15
Plan Prescribed Dose Per Fraction: 2 Gy
Plan Total Fractions Prescribed: 15
Plan Total Prescribed Dose: 30 Gy
Reference Point Dosage Given to Date: 30 Gy
Reference Point Session Dosage Given: 2 Gy
Session Number: 40

## 2022-09-03 NOTE — Progress Notes (Signed)
Patient was a RadOnc Consult on 10/31 for his stage T1c adenocarcinoma of the prostate with Gleason score of 4+4, and PSA of 5.15.  Patient proceed with treatment recommendations of 8 weeks of external beam therapy in combination with ADT and had his final radiation treatment on 4/8.   Patient is scheduled for a post treatment PSA on 6/11 at [facility].

## 2022-09-11 ENCOUNTER — Other Ambulatory Visit: Payer: Self-pay

## 2022-09-11 MED ORDER — ELIQUIS 5 MG PO TABS
5.0000 mg | ORAL_TABLET | Freq: Two times a day (BID) | ORAL | 5 refills | Status: DC
  Filled 2022-09-11: qty 60, 30d supply, fill #0
  Filled 2022-10-10: qty 60, 30d supply, fill #1
  Filled 2023-08-02: qty 60, 30d supply, fill #2
  Filled 2023-09-08: qty 60, 30d supply, fill #3

## 2022-09-11 MED ORDER — RANOLAZINE ER 500 MG PO TB12
500.0000 mg | ORAL_TABLET | Freq: Two times a day (BID) | ORAL | 2 refills | Status: DC
  Filled 2022-09-11: qty 60, 30d supply, fill #0
  Filled 2022-10-10: qty 60, 30d supply, fill #1

## 2022-09-11 MED ORDER — METOPROLOL SUCCINATE ER 25 MG PO TB24
ORAL_TABLET | ORAL | 3 refills | Status: DC
  Filled 2022-09-11: qty 60, 30d supply, fill #0
  Filled 2022-10-10: qty 60, 30d supply, fill #1

## 2022-09-12 ENCOUNTER — Other Ambulatory Visit: Payer: Self-pay

## 2022-09-17 ENCOUNTER — Other Ambulatory Visit: Payer: Self-pay

## 2022-09-17 DIAGNOSIS — E559 Vitamin D deficiency, unspecified: Secondary | ICD-10-CM | POA: Diagnosis not present

## 2022-09-17 DIAGNOSIS — J4520 Mild intermittent asthma, uncomplicated: Secondary | ICD-10-CM | POA: Diagnosis not present

## 2022-09-17 DIAGNOSIS — K219 Gastro-esophageal reflux disease without esophagitis: Secondary | ICD-10-CM | POA: Diagnosis not present

## 2022-09-17 DIAGNOSIS — E789 Disorder of lipoprotein metabolism, unspecified: Secondary | ICD-10-CM | POA: Diagnosis not present

## 2022-09-17 DIAGNOSIS — M25511 Pain in right shoulder: Secondary | ICD-10-CM | POA: Diagnosis not present

## 2022-09-17 DIAGNOSIS — Z87438 Personal history of other diseases of male genital organs: Secondary | ICD-10-CM | POA: Diagnosis not present

## 2022-09-17 DIAGNOSIS — R7303 Prediabetes: Secondary | ICD-10-CM | POA: Diagnosis not present

## 2022-09-17 DIAGNOSIS — C61 Malignant neoplasm of prostate: Secondary | ICD-10-CM | POA: Diagnosis not present

## 2022-09-17 DIAGNOSIS — R030 Elevated blood-pressure reading, without diagnosis of hypertension: Secondary | ICD-10-CM | POA: Diagnosis not present

## 2022-09-17 DIAGNOSIS — I2510 Atherosclerotic heart disease of native coronary artery without angina pectoris: Secondary | ICD-10-CM | POA: Diagnosis not present

## 2022-09-17 DIAGNOSIS — I10 Essential (primary) hypertension: Secondary | ICD-10-CM | POA: Diagnosis not present

## 2022-09-17 DIAGNOSIS — R591 Generalized enlarged lymph nodes: Secondary | ICD-10-CM | POA: Diagnosis not present

## 2022-09-18 ENCOUNTER — Other Ambulatory Visit: Payer: Self-pay

## 2022-09-18 MED ORDER — TAMSULOSIN HCL 0.4 MG PO CAPS
0.4000 mg | ORAL_CAPSULE | Freq: Every day | ORAL | 1 refills | Status: DC
  Filled 2022-09-18 – 2022-10-10 (×2): qty 90, 90d supply, fill #0
  Filled 2022-11-26 – 2022-12-19 (×2): qty 90, 90d supply, fill #1

## 2022-09-18 MED ORDER — MAPAP 500 MG PO CAPS: ORAL_CAPSULE | ORAL | 0 refills | Status: DC

## 2022-09-18 MED ORDER — FINASTERIDE 5 MG PO TABS
5.0000 mg | ORAL_TABLET | Freq: Every day | ORAL | 1 refills | Status: DC
  Filled 2022-09-18 – 2022-09-28 (×2): qty 90, 90d supply, fill #0
  Filled 2022-12-26: qty 90, 90d supply, fill #1

## 2022-09-18 MED ORDER — VALSARTAN 40 MG PO TABS
40.0000 mg | ORAL_TABLET | Freq: Every day | ORAL | 1 refills | Status: DC
  Filled 2022-09-18: qty 90, 90d supply, fill #0
  Filled 2022-12-19: qty 90, 90d supply, fill #1

## 2022-09-18 MED ORDER — ATORVASTATIN CALCIUM 40 MG PO TABS
40.0000 mg | ORAL_TABLET | Freq: Every day | ORAL | 1 refills | Status: DC
  Filled 2022-09-18: qty 90, 90d supply, fill #0
  Filled 2022-12-19: qty 90, 90d supply, fill #1

## 2022-09-19 ENCOUNTER — Other Ambulatory Visit: Payer: Self-pay

## 2022-09-19 DIAGNOSIS — J4531 Mild persistent asthma with (acute) exacerbation: Secondary | ICD-10-CM | POA: Diagnosis not present

## 2022-09-19 DIAGNOSIS — H16231 Neurotrophic keratoconjunctivitis, right eye: Secondary | ICD-10-CM | POA: Diagnosis not present

## 2022-09-19 DIAGNOSIS — H4089 Other specified glaucoma: Secondary | ICD-10-CM | POA: Diagnosis not present

## 2022-09-19 DIAGNOSIS — Z947 Corneal transplant status: Secondary | ICD-10-CM | POA: Diagnosis not present

## 2022-09-19 DIAGNOSIS — T868411 Corneal transplant failure, right eye: Secondary | ICD-10-CM | POA: Diagnosis not present

## 2022-09-19 DIAGNOSIS — H40219 Acute angle-closure glaucoma, unspecified eye: Secondary | ICD-10-CM | POA: Diagnosis not present

## 2022-09-19 DIAGNOSIS — Z961 Presence of intraocular lens: Secondary | ICD-10-CM | POA: Diagnosis not present

## 2022-09-19 MED ORDER — ATROPINE SULFATE 1 % OP SOLN
1.0000 [drp] | Freq: Every day | OPHTHALMIC | 6 refills | Status: DC | PRN
  Filled 2022-09-19: qty 5, 90d supply, fill #0
  Filled 2023-01-10: qty 5, 100d supply, fill #0

## 2022-09-19 MED ORDER — DORZOLAMIDE HCL 2 % OP SOLN
1.0000 [drp] | Freq: Three times a day (TID) | OPHTHALMIC | 6 refills | Status: DC
  Filled 2022-09-19: qty 10, 67d supply, fill #0
  Filled 2022-12-03 – 2023-01-10 (×2): qty 10, 67d supply, fill #1

## 2022-09-19 MED ORDER — TIMOLOL MALEATE 0.5 % OP SOLN
1.0000 [drp] | Freq: Two times a day (BID) | OPHTHALMIC | 11 refills | Status: DC
  Filled 2022-09-19: qty 10, 50d supply, fill #0
  Filled 2022-10-30: qty 10, 50d supply, fill #1
  Filled 2023-01-10: qty 10, 50d supply, fill #2
  Filled 2023-03-26: qty 10, 50d supply, fill #3
  Filled 2023-05-12: qty 10, 50d supply, fill #4
  Filled 2023-06-02 – 2023-08-16 (×2): qty 10, 50d supply, fill #5

## 2022-09-19 MED ORDER — LATANOPROST 0.005 % OP SOLN
1.0000 [drp] | Freq: Every day | OPHTHALMIC | 6 refills | Status: DC
  Filled 2022-09-19: qty 5, 50d supply, fill #0
  Filled 2022-12-03 – 2023-01-10 (×2): qty 5, 50d supply, fill #1
  Filled 2023-03-26: qty 5, 50d supply, fill #2

## 2022-09-20 ENCOUNTER — Other Ambulatory Visit: Payer: Self-pay

## 2022-09-27 ENCOUNTER — Other Ambulatory Visit: Payer: Self-pay | Admitting: Urology

## 2022-09-27 DIAGNOSIS — C61 Malignant neoplasm of prostate: Secondary | ICD-10-CM

## 2022-09-27 NOTE — Progress Notes (Signed)
  Radiation Oncology         [phone]) [phone] ________________________________  Name: Bryan Sullivan MRN: [id number]  Date: 09/03/2022  DOB: 30-Nov-1945  End of Treatment Note  Diagnosis:   77 y.o. gentleman with Stage T1c adenocarcinoma of the prostate with Gleason score of 4+4, and PSA of 5.15 (10.4 adjusted for finasteride).      Indication for treatment:  Curative, Definitive Radiotherapy       Radiation treatment dates:   07/10/22 - 09/03/22  Site/dose:  1. The prostate, seminal vesicles, and pelvic lymph nodes were initially treated to 45 Gy in 25 fractions of 1.8 Gy  2. The prostate only was boosted to 75 Gy with 15 additional fractions of 2.0 Gy   Beams/energy:  1. The prostate, seminal vesicles, and pelvic lymph nodes were initially treated using VMAT intensity modulated radiotherapy delivering 6 megavolt photons. Image guidance was performed with CB-CT studies prior to each fraction. He was immobilized with a body fix lower extremity mold.  2. the prostate only was boosted using VMAT intensity modulated radiotherapy delivering 6 megavolt photons. Image guidance was performed with CB-CT studies prior to each fraction. He was immobilized with a body fix lower extremity mold.  Narrative: The patient tolerated radiation treatment relatively well.   The patient experienced some minor urinary irritation and modest fatigue.  He did experience increased nocturia 6-7 times per night and this was managed with Flomax daily.  He denied any abdominal pain or bowel issues.  Plan: The patient has completed radiation treatment. He will return to radiation oncology clinic for routine followup in one month. I advised him to call or return sooner if he has any questions or concerns related to his recovery or treatment. ________________________________  Artist Pais. Kathrynn Running, M.D.

## 2022-09-28 ENCOUNTER — Other Ambulatory Visit: Payer: Self-pay

## 2022-10-04 DIAGNOSIS — Z113 Encounter for screening for infections with a predominantly sexual mode of transmission: Secondary | ICD-10-CM | POA: Diagnosis not present

## 2022-10-04 DIAGNOSIS — J4520 Mild intermittent asthma, uncomplicated: Secondary | ICD-10-CM | POA: Diagnosis not present

## 2022-10-04 DIAGNOSIS — E789 Disorder of lipoprotein metabolism, unspecified: Secondary | ICD-10-CM | POA: Diagnosis not present

## 2022-10-04 DIAGNOSIS — R7303 Prediabetes: Secondary | ICD-10-CM | POA: Diagnosis not present

## 2022-10-04 DIAGNOSIS — I10 Essential (primary) hypertension: Secondary | ICD-10-CM | POA: Diagnosis not present

## 2022-10-04 DIAGNOSIS — I2510 Atherosclerotic heart disease of native coronary artery without angina pectoris: Secondary | ICD-10-CM | POA: Diagnosis not present

## 2022-10-04 DIAGNOSIS — Z20822 Contact with and (suspected) exposure to covid-19: Secondary | ICD-10-CM | POA: Diagnosis not present

## 2022-10-04 DIAGNOSIS — Z0000 Encounter for general adult medical examination without abnormal findings: Secondary | ICD-10-CM | POA: Diagnosis not present

## 2022-10-04 DIAGNOSIS — C61 Malignant neoplasm of prostate: Secondary | ICD-10-CM | POA: Diagnosis not present

## 2022-10-04 DIAGNOSIS — K219 Gastro-esophageal reflux disease without esophagitis: Secondary | ICD-10-CM | POA: Diagnosis not present

## 2022-10-04 DIAGNOSIS — Z125 Encounter for screening for malignant neoplasm of prostate: Secondary | ICD-10-CM | POA: Diagnosis not present

## 2022-10-04 DIAGNOSIS — R0602 Shortness of breath: Secondary | ICD-10-CM | POA: Diagnosis not present

## 2022-10-04 DIAGNOSIS — R591 Generalized enlarged lymph nodes: Secondary | ICD-10-CM | POA: Diagnosis not present

## 2022-10-04 DIAGNOSIS — R5383 Other fatigue: Secondary | ICD-10-CM | POA: Diagnosis not present

## 2022-10-04 DIAGNOSIS — M25511 Pain in right shoulder: Secondary | ICD-10-CM | POA: Diagnosis not present

## 2022-10-09 ENCOUNTER — Ambulatory Visit
Admission: RE | Admit: 2022-10-09 | Discharge: 2022-10-09 | Disposition: A | Discharge status: Home / Self Care | Payer: Medicare Other | Source: Ambulatory Visit | Attending: Radiation Oncology | Admitting: Radiation Oncology

## 2022-10-09 DIAGNOSIS — C61 Malignant neoplasm of prostate: Secondary | ICD-10-CM | POA: Insufficient documentation

## 2022-10-09 DIAGNOSIS — Z510 Encounter for antineoplastic radiation therapy: Secondary | ICD-10-CM | POA: Insufficient documentation

## 2022-10-09 NOTE — Progress Notes (Signed)
  Radiation Oncology         [phone]) [phone] ________________________________  Name: Bryan Sullivan MRN: [id number]  Date of Service: 10/09/2022  DOB: 1945/08/08  Post Treatment Telephone Note  Diagnosis:  77 y.o. gentleman with Stage T1c adenocarcinoma of the prostate with Gleason score of 4+4, and PSA of 5.15 (10.4 adjusted for finasteride).       Indication for treatment:  Curative, Definitive Radiotherapy        Radiation treatment dates:   07/10/22 - 09/03/22(as documented in provider EOT note)  Pre Treatment IPSS Score: 13 (as documented in the provider consult note)  available for call today.   Patient (has a scheduled follow up visit with his urologist, Dr. Alvester Morin, on 11/13/2022 for ongoing surveillance. He was counseled that PSA levels will be drawn in the urology office, and was reassured that additional time is expected to improve bowel and bladder symptoms. He was encouraged to call back with concerns or questions regarding radiation.    Ruel Favors, LPN

## 2022-10-11 ENCOUNTER — Other Ambulatory Visit: Payer: Self-pay

## 2022-10-11 DIAGNOSIS — J45909 Unspecified asthma, uncomplicated: Secondary | ICD-10-CM | POA: Diagnosis not present

## 2022-10-11 DIAGNOSIS — R0602 Shortness of breath: Secondary | ICD-10-CM | POA: Diagnosis not present

## 2022-10-11 DIAGNOSIS — R942 Abnormal results of pulmonary function studies: Secondary | ICD-10-CM | POA: Diagnosis not present

## 2022-10-12 ENCOUNTER — Other Ambulatory Visit: Payer: Self-pay

## 2022-10-18 DIAGNOSIS — K219 Gastro-esophageal reflux disease without esophagitis: Secondary | ICD-10-CM | POA: Diagnosis not present

## 2022-10-18 DIAGNOSIS — J4520 Mild intermittent asthma, uncomplicated: Secondary | ICD-10-CM | POA: Diagnosis not present

## 2022-10-18 DIAGNOSIS — R7303 Prediabetes: Secondary | ICD-10-CM | POA: Diagnosis not present

## 2022-10-18 DIAGNOSIS — I10 Essential (primary) hypertension: Secondary | ICD-10-CM | POA: Diagnosis not present

## 2022-10-18 DIAGNOSIS — I2510 Atherosclerotic heart disease of native coronary artery without angina pectoris: Secondary | ICD-10-CM | POA: Diagnosis not present

## 2022-10-18 DIAGNOSIS — E789 Disorder of lipoprotein metabolism, unspecified: Secondary | ICD-10-CM | POA: Diagnosis not present

## 2022-10-18 DIAGNOSIS — C61 Malignant neoplasm of prostate: Secondary | ICD-10-CM | POA: Diagnosis not present

## 2022-10-18 DIAGNOSIS — E559 Vitamin D deficiency, unspecified: Secondary | ICD-10-CM | POA: Diagnosis not present

## 2022-10-18 DIAGNOSIS — D539 Nutritional anemia, unspecified: Secondary | ICD-10-CM | POA: Diagnosis not present

## 2022-10-18 DIAGNOSIS — M25511 Pain in right shoulder: Secondary | ICD-10-CM | POA: Diagnosis not present

## 2022-10-18 DIAGNOSIS — Z87438 Personal history of other diseases of male genital organs: Secondary | ICD-10-CM | POA: Diagnosis not present

## 2022-10-23 ENCOUNTER — Other Ambulatory Visit: Payer: Self-pay

## 2022-10-31 ENCOUNTER — Other Ambulatory Visit: Payer: Self-pay

## 2022-11-01 ENCOUNTER — Other Ambulatory Visit: Payer: Self-pay

## 2022-11-05 ENCOUNTER — Other Ambulatory Visit: Payer: Self-pay

## 2022-11-06 ENCOUNTER — Other Ambulatory Visit: Payer: Self-pay

## 2022-11-06 DIAGNOSIS — C61 Malignant neoplasm of prostate: Secondary | ICD-10-CM | POA: Diagnosis not present

## 2022-11-07 ENCOUNTER — Other Ambulatory Visit: Payer: Self-pay

## 2022-11-07 DIAGNOSIS — I471 Supraventricular tachycardia, unspecified: Secondary | ICD-10-CM | POA: Diagnosis not present

## 2022-11-07 DIAGNOSIS — R9431 Abnormal electrocardiogram [ECG] [EKG]: Secondary | ICD-10-CM | POA: Diagnosis not present

## 2022-11-07 DIAGNOSIS — I10 Essential (primary) hypertension: Secondary | ICD-10-CM | POA: Diagnosis not present

## 2022-11-07 DIAGNOSIS — R0789 Other chest pain: Secondary | ICD-10-CM | POA: Diagnosis not present

## 2022-11-07 DIAGNOSIS — I480 Paroxysmal atrial fibrillation: Secondary | ICD-10-CM | POA: Diagnosis not present

## 2022-11-07 DIAGNOSIS — I951 Orthostatic hypotension: Secondary | ICD-10-CM | POA: Diagnosis not present

## 2022-11-07 DIAGNOSIS — I2510 Atherosclerotic heart disease of native coronary artery without angina pectoris: Secondary | ICD-10-CM | POA: Diagnosis not present

## 2022-11-07 MED ORDER — ELIQUIS 5 MG PO TABS
5.0000 mg | ORAL_TABLET | Freq: Two times a day (BID) | ORAL | 5 refills | Status: DC
  Filled 2022-11-07: qty 60, 30d supply, fill #0
  Filled 2022-12-11: qty 60, 30d supply, fill #1
  Filled 2023-01-10: qty 60, 30d supply, fill #2
  Filled 2023-02-11: qty 60, 30d supply, fill #3
  Filled 2023-03-11: qty 60, 30d supply, fill #4
  Filled 2023-04-10: qty 60, 30d supply, fill #5

## 2022-11-07 MED ORDER — RANOLAZINE ER 500 MG PO TB12
500.0000 mg | ORAL_TABLET | Freq: Two times a day (BID) | ORAL | 3 refills | Status: DC
  Filled 2022-11-07: qty 60, 30d supply, fill #0
  Filled 2022-12-11: qty 60, 30d supply, fill #1
  Filled 2023-01-10: qty 60, 30d supply, fill #2
  Filled 2023-02-11: qty 60, 30d supply, fill #3

## 2022-11-07 MED ORDER — METOPROLOL SUCCINATE ER 25 MG PO TB24
25.0000 mg | ORAL_TABLET | Freq: Two times a day (BID) | ORAL | 3 refills | Status: DC
  Filled 2022-11-07: qty 60, 30d supply, fill #0
  Filled 2022-12-03 – 2022-12-11 (×2): qty 60, 30d supply, fill #1
  Filled 2023-01-10: qty 60, 30d supply, fill #2
  Filled 2023-02-11: qty 60, 30d supply, fill #3

## 2022-11-08 ENCOUNTER — Other Ambulatory Visit: Payer: Self-pay

## 2022-11-08 ENCOUNTER — Inpatient Hospital Stay: Payer: Medicare Other | Attending: Adult Health | Admitting: *Deleted

## 2022-11-08 ENCOUNTER — Encounter: Payer: Self-pay | Admitting: *Deleted

## 2022-11-08 DIAGNOSIS — C61 Malignant neoplasm of prostate: Secondary | ICD-10-CM

## 2022-11-08 NOTE — Progress Notes (Signed)
SCP reviewed and completed. 

## 2022-11-09 ENCOUNTER — Other Ambulatory Visit: Payer: Self-pay

## 2022-11-09 DIAGNOSIS — C61 Malignant neoplasm of prostate: Secondary | ICD-10-CM | POA: Diagnosis not present

## 2022-11-09 DIAGNOSIS — D509 Iron deficiency anemia, unspecified: Secondary | ICD-10-CM | POA: Diagnosis not present

## 2022-11-09 DIAGNOSIS — R7303 Prediabetes: Secondary | ICD-10-CM | POA: Diagnosis not present

## 2022-11-09 DIAGNOSIS — J4520 Mild intermittent asthma, uncomplicated: Secondary | ICD-10-CM | POA: Diagnosis not present

## 2022-11-09 DIAGNOSIS — M25511 Pain in right shoulder: Secondary | ICD-10-CM | POA: Diagnosis not present

## 2022-11-09 DIAGNOSIS — D539 Nutritional anemia, unspecified: Secondary | ICD-10-CM | POA: Diagnosis not present

## 2022-11-09 DIAGNOSIS — E559 Vitamin D deficiency, unspecified: Secondary | ICD-10-CM | POA: Diagnosis not present

## 2022-11-09 DIAGNOSIS — I951 Orthostatic hypotension: Secondary | ICD-10-CM | POA: Diagnosis not present

## 2022-11-09 DIAGNOSIS — I2510 Atherosclerotic heart disease of native coronary artery without angina pectoris: Secondary | ICD-10-CM | POA: Diagnosis not present

## 2022-11-09 DIAGNOSIS — Z87438 Personal history of other diseases of male genital organs: Secondary | ICD-10-CM | POA: Diagnosis not present

## 2022-11-09 DIAGNOSIS — I10 Essential (primary) hypertension: Secondary | ICD-10-CM | POA: Diagnosis not present

## 2022-11-09 DIAGNOSIS — E789 Disorder of lipoprotein metabolism, unspecified: Secondary | ICD-10-CM | POA: Diagnosis not present

## 2022-11-09 MED ORDER — VALSARTAN 40 MG PO TABS
20.0000 mg | ORAL_TABLET | Freq: Every day | ORAL | 1 refills | Status: DC
  Filled 2022-11-09 – 2023-08-23 (×2): qty 45, 90d supply, fill #0
  Filled 2023-09-08: qty 45, 90d supply, fill #1

## 2022-11-09 MED ORDER — FERROUS GLUCONATE 324 (38 FE) MG PO TABS: 324.0000 mg | ORAL_TABLET | Freq: Every day | ORAL | 1 refills | Status: DC

## 2022-11-13 DIAGNOSIS — R350 Frequency of micturition: Secondary | ICD-10-CM | POA: Diagnosis not present

## 2022-11-13 DIAGNOSIS — C61 Malignant neoplasm of prostate: Secondary | ICD-10-CM | POA: Diagnosis not present

## 2022-11-15 DIAGNOSIS — R942 Abnormal results of pulmonary function studies: Secondary | ICD-10-CM | POA: Diagnosis not present

## 2022-11-15 DIAGNOSIS — R0602 Shortness of breath: Secondary | ICD-10-CM | POA: Diagnosis not present

## 2022-11-18 ENCOUNTER — Other Ambulatory Visit: Payer: Self-pay

## 2022-11-19 ENCOUNTER — Other Ambulatory Visit: Payer: Self-pay

## 2022-11-19 MED ORDER — ISOSORBIDE MONONITRATE ER 30 MG PO TB24
30.0000 mg | ORAL_TABLET | Freq: Every day | ORAL | 5 refills | Status: DC
  Filled 2022-11-19: qty 30, 30d supply, fill #0
  Filled 2022-12-19: qty 30, 30d supply, fill #1

## 2022-11-20 ENCOUNTER — Other Ambulatory Visit: Payer: Self-pay

## 2022-11-22 ENCOUNTER — Other Ambulatory Visit: Payer: Self-pay

## 2022-11-22 MED ORDER — ALBUTEROL SULFATE (2.5 MG/3ML) 0.083% IN NEBU
3.0000 mL | INHALATION_SOLUTION | RESPIRATORY_TRACT | 1 refills | Status: DC | PRN
  Filled 2022-11-22 (×2): qty 90, 5d supply, fill #0

## 2022-11-26 ENCOUNTER — Other Ambulatory Visit: Payer: Self-pay

## 2022-11-27 ENCOUNTER — Other Ambulatory Visit: Payer: Self-pay

## 2022-11-27 DIAGNOSIS — D649 Anemia, unspecified: Secondary | ICD-10-CM | POA: Diagnosis not present

## 2022-12-03 ENCOUNTER — Other Ambulatory Visit: Payer: Self-pay

## 2022-12-10 ENCOUNTER — Other Ambulatory Visit: Payer: Self-pay

## 2022-12-12 ENCOUNTER — Other Ambulatory Visit: Payer: Self-pay

## 2022-12-19 ENCOUNTER — Other Ambulatory Visit: Payer: Self-pay

## 2022-12-19 DIAGNOSIS — E789 Disorder of lipoprotein metabolism, unspecified: Secondary | ICD-10-CM | POA: Diagnosis not present

## 2022-12-19 DIAGNOSIS — D539 Nutritional anemia, unspecified: Secondary | ICD-10-CM | POA: Diagnosis not present

## 2022-12-19 DIAGNOSIS — M25511 Pain in right shoulder: Secondary | ICD-10-CM | POA: Diagnosis not present

## 2022-12-19 DIAGNOSIS — R7303 Prediabetes: Secondary | ICD-10-CM | POA: Diagnosis not present

## 2022-12-19 DIAGNOSIS — I10 Essential (primary) hypertension: Secondary | ICD-10-CM | POA: Diagnosis not present

## 2022-12-19 DIAGNOSIS — C61 Malignant neoplasm of prostate: Secondary | ICD-10-CM | POA: Diagnosis not present

## 2022-12-19 DIAGNOSIS — K219 Gastro-esophageal reflux disease without esophagitis: Secondary | ICD-10-CM | POA: Diagnosis not present

## 2022-12-19 DIAGNOSIS — J4520 Mild intermittent asthma, uncomplicated: Secondary | ICD-10-CM | POA: Diagnosis not present

## 2022-12-19 DIAGNOSIS — D509 Iron deficiency anemia, unspecified: Secondary | ICD-10-CM | POA: Diagnosis not present

## 2022-12-19 DIAGNOSIS — G4700 Insomnia, unspecified: Secondary | ICD-10-CM | POA: Diagnosis not present

## 2022-12-19 DIAGNOSIS — I2510 Atherosclerotic heart disease of native coronary artery without angina pectoris: Secondary | ICD-10-CM | POA: Diagnosis not present

## 2022-12-19 DIAGNOSIS — E559 Vitamin D deficiency, unspecified: Secondary | ICD-10-CM | POA: Diagnosis not present

## 2022-12-19 MED ORDER — DICLOFENAC SODIUM 1 % EX GEL
1.0000 | Freq: Two times a day (BID) | CUTANEOUS | 5 refills | Status: DC | PRN
  Filled 2022-12-19: qty 100, 12d supply, fill #0

## 2022-12-21 ENCOUNTER — Other Ambulatory Visit: Payer: Self-pay

## 2022-12-24 DIAGNOSIS — R0602 Shortness of breath: Secondary | ICD-10-CM | POA: Diagnosis not present

## 2022-12-24 DIAGNOSIS — I2510 Atherosclerotic heart disease of native coronary artery without angina pectoris: Secondary | ICD-10-CM | POA: Diagnosis not present

## 2022-12-24 DIAGNOSIS — I471 Supraventricular tachycardia, unspecified: Secondary | ICD-10-CM | POA: Diagnosis not present

## 2022-12-24 DIAGNOSIS — I951 Orthostatic hypotension: Secondary | ICD-10-CM | POA: Diagnosis not present

## 2022-12-24 DIAGNOSIS — I4729 Other ventricular tachycardia: Secondary | ICD-10-CM | POA: Diagnosis not present

## 2022-12-24 DIAGNOSIS — R0789 Other chest pain: Secondary | ICD-10-CM | POA: Diagnosis not present

## 2022-12-24 DIAGNOSIS — I491 Atrial premature depolarization: Secondary | ICD-10-CM | POA: Diagnosis not present

## 2022-12-24 DIAGNOSIS — R9431 Abnormal electrocardiogram [ECG] [EKG]: Secondary | ICD-10-CM | POA: Diagnosis not present

## 2022-12-24 DIAGNOSIS — I480 Paroxysmal atrial fibrillation: Secondary | ICD-10-CM | POA: Diagnosis not present

## 2022-12-24 DIAGNOSIS — I10 Essential (primary) hypertension: Secondary | ICD-10-CM | POA: Diagnosis not present

## 2022-12-26 ENCOUNTER — Other Ambulatory Visit: Payer: Self-pay

## 2023-01-10 ENCOUNTER — Other Ambulatory Visit: Payer: Self-pay

## 2023-01-11 ENCOUNTER — Other Ambulatory Visit: Payer: Self-pay

## 2023-02-04 DIAGNOSIS — H04123 Dry eye syndrome of bilateral lacrimal glands: Secondary | ICD-10-CM | POA: Diagnosis not present

## 2023-02-04 DIAGNOSIS — Z961 Presence of intraocular lens: Secondary | ICD-10-CM | POA: Diagnosis not present

## 2023-02-04 DIAGNOSIS — H40023 Open angle with borderline findings, high risk, bilateral: Secondary | ICD-10-CM | POA: Diagnosis not present

## 2023-02-11 ENCOUNTER — Other Ambulatory Visit: Payer: Self-pay

## 2023-02-11 DIAGNOSIS — C61 Malignant neoplasm of prostate: Secondary | ICD-10-CM | POA: Diagnosis not present

## 2023-02-19 ENCOUNTER — Other Ambulatory Visit: Payer: Self-pay

## 2023-02-19 DIAGNOSIS — C61 Malignant neoplasm of prostate: Secondary | ICD-10-CM | POA: Diagnosis not present

## 2023-02-19 DIAGNOSIS — J45909 Unspecified asthma, uncomplicated: Secondary | ICD-10-CM | POA: Diagnosis not present

## 2023-02-19 MED ORDER — ALBUTEROL SULFATE HFA 108 (90 BASE) MCG/ACT IN AERS
2.0000 | INHALATION_SPRAY | RESPIRATORY_TRACT | 3 refills | Status: DC | PRN
  Filled 2023-02-19: qty 54, 48d supply, fill #0
  Filled 2023-04-17 (×2): qty 54, 51d supply, fill #0

## 2023-02-19 MED ORDER — BREZTRI AEROSPHERE 160-9-4.8 MCG/ACT IN AERO
2.0000 | INHALATION_SPRAY | Freq: Two times a day (BID) | RESPIRATORY_TRACT | 3 refills | Status: DC
  Filled 2023-02-19 – 2023-04-17 (×3): qty 21.4, 60d supply, fill #0
  Filled 2023-09-09: qty 32.1, 90d supply, fill #1
  Filled 2024-02-17: qty 10.7, 30d supply, fill #2

## 2023-03-01 ENCOUNTER — Other Ambulatory Visit: Payer: Self-pay

## 2023-03-11 ENCOUNTER — Other Ambulatory Visit: Payer: Self-pay

## 2023-03-11 DIAGNOSIS — R0789 Other chest pain: Secondary | ICD-10-CM | POA: Diagnosis not present

## 2023-03-11 DIAGNOSIS — R9431 Abnormal electrocardiogram [ECG] [EKG]: Secondary | ICD-10-CM | POA: Diagnosis not present

## 2023-03-11 DIAGNOSIS — I951 Orthostatic hypotension: Secondary | ICD-10-CM | POA: Diagnosis not present

## 2023-03-11 DIAGNOSIS — I471 Supraventricular tachycardia, unspecified: Secondary | ICD-10-CM | POA: Diagnosis not present

## 2023-03-11 DIAGNOSIS — I2510 Atherosclerotic heart disease of native coronary artery without angina pectoris: Secondary | ICD-10-CM | POA: Diagnosis not present

## 2023-03-11 DIAGNOSIS — I10 Essential (primary) hypertension: Secondary | ICD-10-CM | POA: Diagnosis not present

## 2023-03-11 DIAGNOSIS — E7800 Pure hypercholesterolemia, unspecified: Secondary | ICD-10-CM | POA: Diagnosis not present

## 2023-03-11 MED ORDER — METOPROLOL SUCCINATE ER 25 MG PO TB24
25.0000 mg | ORAL_TABLET | Freq: Two times a day (BID) | ORAL | 3 refills | Status: DC
  Filled 2023-03-11: qty 60, 30d supply, fill #0
  Filled 2023-04-10: qty 60, 30d supply, fill #1
  Filled 2023-05-12: qty 60, 30d supply, fill #2
  Filled 2023-06-02 – 2023-06-10 (×2): qty 60, 30d supply, fill #3

## 2023-03-11 MED ORDER — RANOLAZINE ER 500 MG PO TB12
500.0000 mg | ORAL_TABLET | Freq: Two times a day (BID) | ORAL | 3 refills | Status: DC
  Filled 2023-03-11: qty 60, 30d supply, fill #0
  Filled 2023-04-10: qty 60, 30d supply, fill #1
  Filled 2023-05-12: qty 60, 30d supply, fill #2
  Filled 2023-06-02 – 2023-06-10 (×2): qty 60, 30d supply, fill #3

## 2023-03-13 ENCOUNTER — Other Ambulatory Visit: Payer: Self-pay

## 2023-03-14 ENCOUNTER — Other Ambulatory Visit: Payer: Self-pay

## 2023-03-15 ENCOUNTER — Other Ambulatory Visit: Payer: Self-pay

## 2023-03-15 MED ORDER — ATORVASTATIN CALCIUM 40 MG PO TABS
40.0000 mg | ORAL_TABLET | Freq: Every day | ORAL | 1 refills | Status: DC
  Filled 2023-03-15: qty 90, 90d supply, fill #0
  Filled 2023-06-02: qty 90, 90d supply, fill #1

## 2023-03-15 MED ORDER — TAMSULOSIN HCL 0.4 MG PO CAPS
0.4000 mg | ORAL_CAPSULE | Freq: Every day | ORAL | 1 refills | Status: DC
  Filled 2023-03-15: qty 90, 90d supply, fill #0
  Filled 2023-06-02: qty 90, 90d supply, fill #1

## 2023-03-15 MED ORDER — VALSARTAN 40 MG PO TABS
20.0000 mg | ORAL_TABLET | Freq: Every day | ORAL | 1 refills | Status: DC
  Filled 2023-03-15: qty 45, 90d supply, fill #0
  Filled 2023-06-02: qty 45, 90d supply, fill #1

## 2023-03-15 MED ORDER — FINASTERIDE 5 MG PO TABS
5.0000 mg | ORAL_TABLET | Freq: Every day | ORAL | 1 refills | Status: DC
  Filled 2023-03-15: qty 90, 90d supply, fill #0
  Filled 2023-06-02: qty 90, 90d supply, fill #1

## 2023-03-26 ENCOUNTER — Other Ambulatory Visit: Payer: Self-pay

## 2023-03-27 ENCOUNTER — Other Ambulatory Visit: Payer: Self-pay

## 2023-03-28 DIAGNOSIS — I6523 Occlusion and stenosis of bilateral carotid arteries: Secondary | ICD-10-CM | POA: Diagnosis not present

## 2023-04-03 ENCOUNTER — Other Ambulatory Visit: Payer: Self-pay

## 2023-04-03 DIAGNOSIS — M25511 Pain in right shoulder: Secondary | ICD-10-CM | POA: Diagnosis not present

## 2023-04-03 DIAGNOSIS — D539 Nutritional anemia, unspecified: Secondary | ICD-10-CM | POA: Diagnosis not present

## 2023-04-03 DIAGNOSIS — K219 Gastro-esophageal reflux disease without esophagitis: Secondary | ICD-10-CM | POA: Diagnosis not present

## 2023-04-03 DIAGNOSIS — D509 Iron deficiency anemia, unspecified: Secondary | ICD-10-CM | POA: Diagnosis not present

## 2023-04-03 DIAGNOSIS — R7303 Prediabetes: Secondary | ICD-10-CM | POA: Diagnosis not present

## 2023-04-03 DIAGNOSIS — Z87438 Personal history of other diseases of male genital organs: Secondary | ICD-10-CM | POA: Diagnosis not present

## 2023-04-03 DIAGNOSIS — I2510 Atherosclerotic heart disease of native coronary artery without angina pectoris: Secondary | ICD-10-CM | POA: Diagnosis not present

## 2023-04-03 DIAGNOSIS — C61 Malignant neoplasm of prostate: Secondary | ICD-10-CM | POA: Diagnosis not present

## 2023-04-03 DIAGNOSIS — E559 Vitamin D deficiency, unspecified: Secondary | ICD-10-CM | POA: Diagnosis not present

## 2023-04-03 DIAGNOSIS — I10 Essential (primary) hypertension: Secondary | ICD-10-CM | POA: Diagnosis not present

## 2023-04-03 DIAGNOSIS — E789 Disorder of lipoprotein metabolism, unspecified: Secondary | ICD-10-CM | POA: Diagnosis not present

## 2023-04-03 DIAGNOSIS — J4520 Mild intermittent asthma, uncomplicated: Secondary | ICD-10-CM | POA: Diagnosis not present

## 2023-04-03 MED ORDER — OMEPRAZOLE 20 MG PO CPDR
20.0000 mg | DELAYED_RELEASE_CAPSULE | Freq: Every day | ORAL | 1 refills | Status: DC
  Filled 2023-04-03: qty 90, 90d supply, fill #0

## 2023-04-03 MED ORDER — DICLOFENAC SODIUM 1 % EX GEL
1.0000 | Freq: Two times a day (BID) | CUTANEOUS | 5 refills | Status: DC | PRN
Start: 2023-04-03 — End: 2024-03-04
  Filled 2023-04-03: qty 100, 12d supply, fill #0
  Filled 2023-08-06: qty 200, 24d supply, fill #1

## 2023-04-03 MED ORDER — FERROUS GLUCONATE 324 (38 FE) MG PO TABS
324.0000 mg | ORAL_TABLET | Freq: Every day | ORAL | 1 refills | Status: DC
  Filled 2023-04-03: qty 90, 90d supply, fill #0

## 2023-04-03 MED ORDER — FLUTICASONE PROPIONATE 50 MCG/ACT NA SUSP
2.0000 | Freq: Every day | NASAL | 1 refills | Status: DC
  Filled 2023-04-03: qty 48, 90d supply, fill #0

## 2023-04-03 MED ORDER — TERBINAFINE HCL 250 MG PO TABS
250.0000 mg | ORAL_TABLET | Freq: Every day | ORAL | 0 refills | Status: DC
  Filled 2023-04-03: qty 30, 30d supply, fill #0

## 2023-04-09 ENCOUNTER — Other Ambulatory Visit: Payer: Self-pay

## 2023-04-11 ENCOUNTER — Other Ambulatory Visit: Payer: Self-pay

## 2023-04-12 ENCOUNTER — Other Ambulatory Visit: Payer: Self-pay

## 2023-04-17 ENCOUNTER — Other Ambulatory Visit: Payer: Self-pay

## 2023-04-17 ENCOUNTER — Ambulatory Visit (INDEPENDENT_AMBULATORY_CARE_PROVIDER_SITE_OTHER): Payer: Medicare Other | Admitting: Podiatry

## 2023-04-17 ENCOUNTER — Ambulatory Visit (INDEPENDENT_AMBULATORY_CARE_PROVIDER_SITE_OTHER): Payer: Medicare Other

## 2023-04-17 ENCOUNTER — Encounter: Payer: Self-pay | Admitting: Podiatry

## 2023-04-17 DIAGNOSIS — R600 Localized edema: Secondary | ICD-10-CM | POA: Diagnosis not present

## 2023-04-17 DIAGNOSIS — M7752 Other enthesopathy of left foot: Secondary | ICD-10-CM | POA: Diagnosis not present

## 2023-04-17 DIAGNOSIS — M779 Enthesopathy, unspecified: Secondary | ICD-10-CM

## 2023-04-17 DIAGNOSIS — M7751 Other enthesopathy of right foot: Secondary | ICD-10-CM

## 2023-04-17 MED ORDER — TRIAMCINOLONE ACETONIDE 10 MG/ML IJ SUSP
10.0000 mg | Freq: Once | INTRAMUSCULAR | Status: AC
  Administered 2023-04-17: 10 mg via INTRA_ARTICULAR

## 2023-04-18 ENCOUNTER — Other Ambulatory Visit: Payer: Self-pay

## 2023-04-18 ENCOUNTER — Other Ambulatory Visit (HOSPITAL_BASED_OUTPATIENT_CLINIC_OR_DEPARTMENT_OTHER): Payer: Self-pay

## 2023-04-18 NOTE — Progress Notes (Signed)
Subjective:   Patient ID: Bryan Sullivan, male   DOB: 77 y.o.   MRN: [id number]   HPI Patient states has been having a lot of pain on top of both feet and it has been going on now for around a year and he gets pain on the bottom of his feet and he has taken medication which is not currently effective.  Patient does not smoke and tries to be active   Review of Systems  All other systems reviewed and are negative.       Objective:  Physical Exam Vitals and nursing note reviewed.  Constitutional:      Appearance: He is well-developed.  Pulmonary:     Effort: Pulmonary effort is normal.  Musculoskeletal:        General: Normal range of motion.  Skin:    General: Skin is warm.  Neurological:     Mental Status: He is alert.     Neurovascular status found to be intact muscle strength was adequate and range of motion of the subtalar midtarsal joint moderately diminished.  Patient has quite a bit of swelling on the dorsal feet bilateral with inflammation consistent with probable moderate arthritis and extensor tendinitis symptomatology.  Good digital perfusion well-oriented     Assessment:  Extensor tendinitis bilateral with arthritis as underlying factor with moderate discomfort plantar which may be compensatory     Plan:  H&P reviewed both conditions and I went ahead today did sterile prep injected the extensor complex 3 mg dexamethasone Kenalog 5 mg Xylocaine advised on ice therapy and reappoint to recheck may require treatment for other conditions depending on response  X-rays indicate moderate arthritis no indications of fracture or severe addition from a bony structural position

## 2023-04-23 ENCOUNTER — Other Ambulatory Visit: Payer: Self-pay

## 2023-05-01 ENCOUNTER — Other Ambulatory Visit: Payer: Self-pay

## 2023-05-01 DIAGNOSIS — Z961 Presence of intraocular lens: Secondary | ICD-10-CM | POA: Diagnosis not present

## 2023-05-01 DIAGNOSIS — T868411 Corneal transplant failure, right eye: Secondary | ICD-10-CM | POA: Diagnosis not present

## 2023-05-01 DIAGNOSIS — H40219 Acute angle-closure glaucoma, unspecified eye: Secondary | ICD-10-CM | POA: Diagnosis not present

## 2023-05-01 DIAGNOSIS — H4089 Other specified glaucoma: Secondary | ICD-10-CM | POA: Diagnosis not present

## 2023-05-01 DIAGNOSIS — H16231 Neurotrophic keratoconjunctivitis, right eye: Secondary | ICD-10-CM | POA: Diagnosis not present

## 2023-05-01 MED ORDER — LATANOPROST 0.005 % OP SOLN: OPHTHALMIC | 6 refills | Status: DC

## 2023-05-01 MED ORDER — FLUOROMETHOLONE 0.1 % OP SUSP
OPHTHALMIC | 10 refills | Status: DC
  Filled 2023-05-01: qty 5, 50d supply, fill #0
  Filled 2023-08-23: qty 5, 50d supply, fill #1

## 2023-05-01 MED ORDER — TIMOLOL MALEATE 0.5 % OP SOLN
1.0000 [drp] | Freq: Two times a day (BID) | OPHTHALMIC | 11 refills | Status: DC
  Filled 2023-06-10: qty 5, 25d supply, fill #0
  Filled 2023-09-09: qty 10, 50d supply, fill #1
  Filled 2024-01-06: qty 10, 50d supply, fill #2
  Filled 2024-03-19: qty 10, 50d supply, fill #3

## 2023-05-01 MED ORDER — ERYTHROMYCIN 5 MG/GM OP OINT: TOPICAL_OINTMENT | OPHTHALMIC | 11 refills | Status: DC

## 2023-05-01 MED ORDER — ATROPINE SULFATE 1 % OP SOLN
1.0000 [drp] | Freq: Every day | OPHTHALMIC | 6 refills | Status: DC | PRN
  Filled 2023-09-09: qty 5, 100d supply, fill #0

## 2023-05-01 MED ORDER — DORZOLAMIDE HCL 2 % OP SOLN
1.0000 [drp] | Freq: Three times a day (TID) | OPHTHALMIC | 6 refills | Status: DC
  Filled 2023-05-01 – 2023-05-02 (×2): qty 10, 67d supply, fill #0
  Filled 2023-06-03: qty 10, 67d supply, fill #1
  Filled 2023-09-09: qty 10, 67d supply, fill #2
  Filled 2024-01-07: qty 10, 67d supply, fill #3
  Filled 2024-03-19: qty 10, 67d supply, fill #4

## 2023-05-02 ENCOUNTER — Other Ambulatory Visit: Payer: Self-pay

## 2023-05-04 DIAGNOSIS — E538 Deficiency of other specified B group vitamins: Secondary | ICD-10-CM | POA: Diagnosis not present

## 2023-05-04 DIAGNOSIS — I10 Essential (primary) hypertension: Secondary | ICD-10-CM | POA: Diagnosis not present

## 2023-05-04 LAB — AMB RESULTS CONSOLE CBG: Glucose: 95

## 2023-05-08 DIAGNOSIS — J4520 Mild intermittent asthma, uncomplicated: Secondary | ICD-10-CM | POA: Diagnosis not present

## 2023-05-12 ENCOUNTER — Other Ambulatory Visit: Payer: Self-pay

## 2023-05-13 ENCOUNTER — Other Ambulatory Visit: Payer: Self-pay

## 2023-05-13 MED ORDER — APIXABAN 5 MG PO TABS
5.0000 mg | ORAL_TABLET | Freq: Two times a day (BID) | ORAL | 5 refills | Status: DC
  Filled 2023-05-13: qty 60, 30d supply, fill #0
  Filled 2023-06-02 – 2023-06-10 (×2): qty 60, 30d supply, fill #1
  Filled 2023-07-02: qty 60, 30d supply, fill #2
  Filled 2024-03-18: qty 60, 30d supply, fill #3
  Filled 2024-04-10: qty 60, 30d supply, fill #4
  Filled 2024-05-11: qty 60, 30d supply, fill #5

## 2023-05-27 DIAGNOSIS — C61 Malignant neoplasm of prostate: Secondary | ICD-10-CM | POA: Diagnosis not present

## 2023-05-27 DIAGNOSIS — R351 Nocturia: Secondary | ICD-10-CM | POA: Diagnosis not present

## 2023-06-02 ENCOUNTER — Other Ambulatory Visit: Payer: Self-pay

## 2023-06-03 ENCOUNTER — Other Ambulatory Visit: Payer: Self-pay

## 2023-06-03 MED ORDER — LATANOPROST 0.005 % OP SOLN
1.0000 [drp] | Freq: Every day | OPHTHALMIC | 6 refills | Status: AC
  Filled 2023-06-03: qty 7.5, 75d supply, fill #0
  Filled 2023-08-23: qty 7.5, 75d supply, fill #1
  Filled 2024-01-07: qty 7.5, 75d supply, fill #2
  Filled 2024-03-19: qty 7.5, 75d supply, fill #3
  Filled 2024-05-29: qty 7.5, 75d supply, fill #4

## 2023-06-10 ENCOUNTER — Other Ambulatory Visit: Payer: Self-pay

## 2023-06-10 MED ORDER — TERBINAFINE HCL 250 MG PO TABS
250.0000 mg | ORAL_TABLET | Freq: Every day | ORAL | 2 refills | Status: DC
  Filled 2023-06-10: qty 30, 30d supply, fill #0

## 2023-06-10 MED ORDER — SULFAMETHOXAZOLE-TRIMETHOPRIM 800-160 MG PO TABS
1.0000 | ORAL_TABLET | Freq: Two times a day (BID) | ORAL | 0 refills | Status: DC
  Filled 2023-06-10: qty 14, 7d supply, fill #0

## 2023-06-11 ENCOUNTER — Other Ambulatory Visit: Payer: Self-pay

## 2023-06-11 MED ORDER — VALSARTAN 40 MG PO TABS
20.0000 mg | ORAL_TABLET | Freq: Every day | ORAL | 5 refills | Status: DC
  Filled 2023-06-11: qty 30, 60d supply, fill #0
  Filled 2023-09-09: qty 45, 90d supply, fill #0
  Filled 2023-12-30: qty 45, 90d supply, fill #1

## 2023-06-11 MED ORDER — METOPROLOL SUCCINATE ER 25 MG PO TB24
25.0000 mg | ORAL_TABLET | Freq: Two times a day (BID) | ORAL | 1 refills | Status: DC
  Filled 2023-06-11 – 2023-08-02 (×2): qty 180, 90d supply, fill #0
  Filled 2024-01-06: qty 180, 90d supply, fill #1

## 2023-06-11 MED ORDER — ATORVASTATIN CALCIUM 40 MG PO TABS
40.0000 mg | ORAL_TABLET | Freq: Every day | ORAL | 1 refills | Status: DC
  Filled 2023-06-11 – 2023-09-09 (×2): qty 90, 90d supply, fill #0

## 2023-06-11 MED ORDER — ELIQUIS 5 MG PO TABS
5.0000 mg | ORAL_TABLET | Freq: Two times a day (BID) | ORAL | 1 refills | Status: DC
  Filled 2023-06-11 – 2023-09-09 (×2): qty 180, 90d supply, fill #0
  Filled 2024-01-06: qty 60, 30d supply, fill #1
  Filled 2024-01-06 (×2): qty 180, 90d supply, fill #1
  Filled 2024-02-12: qty 60, 30d supply, fill #2

## 2023-06-11 MED ORDER — RANOLAZINE ER 500 MG PO TB12
500.0000 mg | ORAL_TABLET | Freq: Two times a day (BID) | ORAL | 1 refills | Status: DC
  Filled 2023-06-11 – 2023-08-02 (×3): qty 180, 90d supply, fill #0

## 2023-06-12 ENCOUNTER — Other Ambulatory Visit: Payer: Self-pay

## 2023-06-13 ENCOUNTER — Other Ambulatory Visit: Payer: Self-pay

## 2023-07-02 ENCOUNTER — Other Ambulatory Visit: Payer: Self-pay

## 2023-07-03 ENCOUNTER — Other Ambulatory Visit: Payer: Self-pay

## 2023-07-03 MED ORDER — METOPROLOL SUCCINATE ER 25 MG PO TB24
25.0000 mg | ORAL_TABLET | Freq: Two times a day (BID) | ORAL | 3 refills | Status: DC
  Filled 2023-07-03: qty 60, 30d supply, fill #0
  Filled 2023-08-23: qty 60, 30d supply, fill #1
  Filled 2023-09-09: qty 180, 90d supply, fill #1

## 2023-07-04 ENCOUNTER — Other Ambulatory Visit: Payer: Self-pay

## 2023-07-10 ENCOUNTER — Other Ambulatory Visit: Payer: Self-pay

## 2023-07-10 MED ORDER — FUROSEMIDE 20 MG PO TABS
20.0000 mg | ORAL_TABLET | Freq: Every day | ORAL | 0 refills | Status: DC
  Filled 2023-07-10: qty 7, 7d supply, fill #0

## 2023-07-11 ENCOUNTER — Other Ambulatory Visit: Payer: Self-pay

## 2023-07-16 ENCOUNTER — Other Ambulatory Visit: Payer: Self-pay

## 2023-07-16 MED ORDER — FERROUS GLUCONATE 324 (38 FE) MG PO TABS: 648.0000 mg | ORAL_TABLET | Freq: Every day | ORAL | 1 refills | Status: DC

## 2023-07-18 NOTE — Progress Notes (Unsigned)
 Pt attended 05/06/23 screening event at [facility] with an elevated BP of 184/89 after three checks. Blood glucose wnl. Pt noted at event that he does have a PCP. At event pt did not indicate any SDOH needs. Pt also noted that he is not a smoker and listed Medicare as his insurance at the event. Pt was screened for bp, SDOHs, and blood glucose.   Per chart review pt does have a PCP but it is noted as Dr. Knox Royalty, the pt indicated Dr. Laural Benes on screening form. There is not a Dr. Laural Benes in his chart indicated. Pt does have insurance, and is not a smoker. Chart review does not indicated any prior CHL encounters to the event and does not indicate any upcoming CHL appts. Chart review further shows multiple pharmacy visits, Pulmonary Doc appt, and Cardiologist since screening event. Pt does have two upcoming non-CHL appts: Cardiology with Dr. Houston Siren on 09/16/23 w/ [facility] and on 10/30/23 w/ Dr. Richardson Landry at [facility]. No upcoming PCP appts are visible at this time.  Pt was also mailed abnormal results letter per initial f/u.   No additional pt f/u to be scheduled at this time per health equity protocol.

## 2023-08-01 ENCOUNTER — Other Ambulatory Visit: Payer: Self-pay

## 2023-08-01 MED ORDER — ERYTHROMYCIN 5 MG/GM OP OINT
TOPICAL_OINTMENT | OPHTHALMIC | 11 refills | Status: DC
Start: 2023-08-01 — End: 2024-03-04
  Filled 2023-08-01: qty 3.5, 7d supply, fill #0
  Filled 2023-08-23: qty 3.5, 7d supply, fill #1
  Filled 2023-09-09: qty 10.5, 50d supply, fill #2

## 2023-08-01 MED ORDER — ATROPINE SULFATE 1 % OP SOLN
1.0000 [drp] | Freq: Every day | OPHTHALMIC | 6 refills | Status: DC | PRN
Start: 2023-08-01 — End: 2024-03-04
  Filled 2023-08-01: qty 5, 100d supply, fill #0
  Filled 2023-09-09: qty 5, 100d supply, fill #1
  Filled 2024-01-06: qty 5, 100d supply, fill #2

## 2023-08-02 ENCOUNTER — Other Ambulatory Visit: Payer: Self-pay

## 2023-08-06 ENCOUNTER — Other Ambulatory Visit: Payer: Self-pay

## 2023-08-16 ENCOUNTER — Other Ambulatory Visit: Payer: Self-pay

## 2023-08-16 MED ORDER — OMEPRAZOLE 20 MG PO CPDR
20.0000 mg | DELAYED_RELEASE_CAPSULE | Freq: Every day | ORAL | 1 refills | Status: DC
Start: 2023-08-16 — End: 2024-03-04
  Filled 2023-08-16: qty 90, 90d supply, fill #0
  Filled 2024-01-06: qty 90, 90d supply, fill #1

## 2023-08-16 MED ORDER — VALSARTAN 40 MG PO TABS: 20.0000 mg | ORAL_TABLET | Freq: Every day | ORAL | 1 refills | Status: DC

## 2023-08-21 ENCOUNTER — Emergency Department (HOSPITAL_COMMUNITY)

## 2023-08-21 ENCOUNTER — Other Ambulatory Visit: Payer: Self-pay

## 2023-08-21 ENCOUNTER — Emergency Department (HOSPITAL_COMMUNITY)
Admission: EM | Admit: 2023-08-21 | Discharge: 2023-08-21 | Disposition: A | Discharge status: Home / Self Care | Attending: Emergency Medicine | Admitting: Emergency Medicine

## 2023-08-21 DIAGNOSIS — J449 Chronic obstructive pulmonary disease, unspecified: Secondary | ICD-10-CM | POA: Diagnosis not present

## 2023-08-21 DIAGNOSIS — Z7901 Long term (current) use of anticoagulants: Secondary | ICD-10-CM | POA: Insufficient documentation

## 2023-08-21 DIAGNOSIS — N189 Chronic kidney disease, unspecified: Secondary | ICD-10-CM | POA: Insufficient documentation

## 2023-08-21 DIAGNOSIS — Z7951 Long term (current) use of inhaled steroids: Secondary | ICD-10-CM | POA: Diagnosis not present

## 2023-08-21 DIAGNOSIS — I129 Hypertensive chronic kidney disease with stage 1 through stage 4 chronic kidney disease, or unspecified chronic kidney disease: Secondary | ICD-10-CM | POA: Insufficient documentation

## 2023-08-21 DIAGNOSIS — J209 Acute bronchitis, unspecified: Secondary | ICD-10-CM | POA: Diagnosis not present

## 2023-08-21 DIAGNOSIS — J4489 Other specified chronic obstructive pulmonary disease: Secondary | ICD-10-CM

## 2023-08-21 DIAGNOSIS — Z79899 Other long term (current) drug therapy: Secondary | ICD-10-CM | POA: Diagnosis not present

## 2023-08-21 DIAGNOSIS — R059 Cough, unspecified: Secondary | ICD-10-CM | POA: Diagnosis present

## 2023-08-21 DIAGNOSIS — R0602 Shortness of breath: Secondary | ICD-10-CM | POA: Diagnosis present

## 2023-08-21 LAB — CBC WITH DIFFERENTIAL/PLATELET
Abs Immature Granulocytes: 0.02 10*3/uL (ref 0.00–0.07)
Basophils Absolute: 0 10*3/uL (ref 0.0–0.1)
Basophils Relative: 0 %
Eosinophils Absolute: 0.1 10*3/uL (ref 0.0–0.5)
Eosinophils Relative: 3 %
HCT: 30.7 % — ABNORMAL LOW (ref 39.0–52.0)
Hemoglobin: 9.8 g/dL — ABNORMAL LOW (ref 13.0–17.0)
Immature Granulocytes: 1 %
Lymphocytes Relative: 11 %
Lymphs Abs: 0.5 10*3/uL — ABNORMAL LOW (ref 0.7–4.0)
MCH: 29.2 pg (ref 26.0–34.0)
MCHC: 31.9 g/dL (ref 30.0–36.0)
MCV: 91.4 fL (ref 80.0–100.0)
Monocytes Absolute: 0.3 10*3/uL (ref 0.1–1.0)
Monocytes Relative: 8 %
Neutro Abs: 3.3 10*3/uL (ref 1.7–7.7)
Neutrophils Relative %: 77 %
Platelets: 196 10*3/uL (ref 150–400)
RBC: 3.36 MIL/uL — ABNORMAL LOW (ref 4.22–5.81)
RDW: 13.8 % (ref 11.5–15.5)
WBC: 4.3 10*3/uL (ref 4.0–10.5)
nRBC: 0 % (ref 0.0–0.2)

## 2023-08-21 LAB — COMPREHENSIVE METABOLIC PANEL
ALT: 11 U/L (ref 0–44)
AST: 13 U/L — ABNORMAL LOW (ref 15–41)
Albumin: 3.3 g/dL — ABNORMAL LOW (ref 3.5–5.0)
Alkaline Phosphatase: 53 U/L (ref 38–126)
Anion gap: 7 (ref 5–15)
BUN: 19 mg/dL (ref 8–23)
CO2: 25 mmol/L (ref 22–32)
Calcium: 8.6 mg/dL — ABNORMAL LOW (ref 8.9–10.3)
Chloride: 102 mmol/L (ref 98–111)
Creatinine, Ser: 0.99 mg/dL (ref 0.61–1.24)
GFR, Estimated: >60 mL/min (ref >60)
Glucose, Bld: 105 mg/dL — ABNORMAL HIGH (ref 70–99)
Potassium: 4.3 mmol/L (ref 3.5–5.1)
Sodium: 134 mmol/L — ABNORMAL LOW (ref 135–145)
Total Bilirubin: 0.9 mg/dL (ref 0.0–1.2)
Total Protein: 6.3 g/dL — ABNORMAL LOW (ref 6.5–8.1)

## 2023-08-21 LAB — TROPONIN I (HIGH SENSITIVITY): Troponin I (High Sensitivity): 7 ng/L (ref <18)

## 2023-08-21 LAB — BRAIN NATRIURETIC PEPTIDE: B Natriuretic Peptide: 147.7 pg/mL — ABNORMAL HIGH (ref 0.0–100.0)

## 2023-08-21 LAB — RESP PANEL BY RT-PCR (RSV, FLU A&B, COVID)  RVPGX2
Influenza A by PCR: NEGATIVE
Influenza B by PCR: NEGATIVE
Resp Syncytial Virus by PCR: NEGATIVE
SARS Coronavirus 2 by RT PCR: NEGATIVE

## 2023-08-21 MED ORDER — IPRATROPIUM-ALBUTEROL 0.5-2.5 (3) MG/3ML IN SOLN
3.0000 mL | Freq: Once | RESPIRATORY_TRACT | Status: AC
  Administered 2023-08-21: 3 mL via RESPIRATORY_TRACT
  Filled 2023-08-21: qty 3

## 2023-08-21 MED ORDER — ALBUTEROL SULFATE (2.5 MG/3ML) 0.083% IN NEBU: 2.5000 mg | INHALATION_SOLUTION | Freq: Four times a day (QID) | RESPIRATORY_TRACT | 0 refills | Status: DC | PRN

## 2023-08-21 MED ORDER — PREDNISONE 20 MG PO TABS
60.0000 mg | ORAL_TABLET | Freq: Every day | ORAL | 0 refills | Status: DC
Start: 2023-08-21 — End: 2023-08-21

## 2023-08-21 MED ORDER — PREDNISONE 20 MG PO TABS: 60.0000 mg | ORAL_TABLET | Freq: Every day | ORAL | 0 refills | Status: DC

## 2023-08-21 MED ORDER — PREDNISONE 10 MG PO TABS
60.0000 mg | ORAL_TABLET | Freq: Every day | ORAL | 0 refills | Status: DC
Start: 2023-08-21 — End: 2023-08-21

## 2023-08-21 MED ORDER — ALBUTEROL SULFATE HFA 108 (90 BASE) MCG/ACT IN AERS: 2.0000 | INHALATION_SPRAY | RESPIRATORY_TRACT | 0 refills | Status: DC | PRN

## 2023-08-21 MED ORDER — AZITHROMYCIN 250 MG PO TABS
250.0000 mg | ORAL_TABLET | Freq: Every day | ORAL | 0 refills | Status: DC
Start: 2023-08-21 — End: 2024-03-04

## 2023-08-21 MED ORDER — PREDNISONE 20 MG PO TABS: 60.0000 mg | ORAL_TABLET | Freq: Every day | ORAL | 0 refills | Status: AC

## 2023-08-21 MED ORDER — PREDNISONE 20 MG PO TABS
60.0000 mg | ORAL_TABLET | Freq: Once | ORAL | Status: DC
  Filled 2023-08-21: qty 3

## 2023-08-21 NOTE — ED Triage Notes (Signed)
 Pt reports asthma flareup x3 days. Been using nebulizer q4 with little relief. Denies sick contacts. Productive cough

## 2023-08-21 NOTE — ED Provider Notes (Signed)
 [place] EMERGENCY DEPARTMENT AT [facility] Provider Note   CSN: [id number] Arrival date & time: 08/21/23  1344     History  Chief Complaint  Patient presents with   Cough    Bryan Sullivan is a 78 y.o. male.  Patient here with shortness of breath the last 2 days.  Asthma history nebulizer treatments with some relief.  Has had cough maybe with some mild production.  History of COPD hypertension CKD paroxysmal A-fib on Eliquis.  Denies any chest pain weakness numbness tingling.  No nausea vomit diarrhea.  No fever.  The history is provided by the patient.       Home Medications Prior to Admission medications   Medication Sig Start Date End Date Taking? Authorizing Provider  azithromycin (ZITHROMAX) 250 MG tablet Take 1 tablet (250 mg total) by mouth daily. Take first 2 tablets together, then 1 every day until finished. 08/21/23  Yes Navin Dogan, DO  Acetaminophen (MAPAP) 500 MG capsule 1 (one) capsule by mouth every six hours, as needed, as needed for pain 09/17/22     acetaminophen (TYLENOL) 500 MG tablet Take 500 mg by mouth as needed for moderate pain.    [provider]  albuterol (PROVENTIL) (2.5 MG/3ML) 0.083% nebulizer solution Take 3 mLs (2.5 mg total) by nebulization every 6 (six) hours as needed for wheezing or shortness of breath. 09/12/21 07/21/22  Claiborne Rigg, NP  albuterol (PROVENTIL) (2.5 MG/3ML) 0.083% nebulizer solution Take 3 mLs (2.5 mg total) by nebulization every 6 (six) hours as needed for wheezing or shortness of breath. 08/21/23 11/19/23  Lizzett Nobile, DO  albuterol (VENTOLIN HFA) 108 (90 Base) MCG/ACT inhaler Inhale 2 puffs into the lungs every 4 (four) hours as needed for shortness of breath or wheezing. 08/21/23   Shakeem Stern, DO  apixaban (ELIQUIS) 5 MG TABS tablet Take 1 tablet (5 mg total) by mouth 2 (two) times daily. 07/12/22     apixaban (ELIQUIS) 5 MG TABS tablet Take 1 tablet (5 mg total) by mouth 2 (two) times  daily. 09/11/22     apixaban (ELIQUIS) 5 MG TABS tablet Take 1 tablet (5 mg total) by mouth 2 (two) times daily. 05/13/23     apixaban (ELIQUIS) 5 MG TABS tablet Take 1 tablet (5 mg total) by mouth 2 (two) times daily. 06/11/23     atorvastatin (LIPITOR) 40 MG tablet Take 1 tablet (40 mg total) by mouth daily. Patient taking differently: Take 40 mg by mouth daily. 06/13/22     atorvastatin (LIPITOR) 40 MG tablet Take 1 tablet (40 mg total) by mouth daily. 03/14/23     atorvastatin (LIPITOR) 40 MG tablet Take 1 tablet (40 mg total) by mouth daily. 06/11/23     atropine 1 % ophthalmic solution Place 1 drop into the right eye daily as needed. 09/19/22     atropine 1 % ophthalmic solution Place 1 drop into the right eye once daily as needed 05/01/23     atropine 1 % ophthalmic solution Place 1 drop into the right eye daily as needed. 08/01/23     Budeson-Glycopyrrol-Formoterol (BREZTRI AEROSPHERE) 160-9-4.8 MCG/ACT AERO Inhale 2 puffs into the lungs in the morning and in the evening. 07/06/22     Budeson-Glycopyrrol-Formoterol (BREZTRI AEROSPHERE) 160-9-4.8 MCG/ACT AERO Inhale 2 puffs into the lungs 2 (two) times daily. 02/19/23     Budeson-Glycopyrrol-Formoterol 160-9-4.8 MCG/ACT AERO Inhale 2 puffs into the lungs 2 (two) times daily (morning and evening). Patient taking differently: Inhale 2 puffs  into the lungs 2 (two) times daily. 06/18/22     diclofenac Sodium (VOLTAREN) 1 % GEL Apply topically as needed.    [provider]  diclofenac Sodium (VOLTAREN) 1 % GEL Apply 1 Application topically 2 (two) times daily as needed. 12/19/22     diclofenac Sodium (VOLTAREN) 1 % GEL Apply 1 Application topically 2 (two) times daily as needed. 04/03/23     dorzolamide (TRUSOPT) 2 % ophthalmic solution Place 1 drop into the right eye 3 (three) times daily Patient taking differently: Place 1 drop into the right eye 3 (three) times daily. 03/14/22     dorzolamide (TRUSOPT) 2 % ophthalmic solution Place 1 drop into the  right eye 3 (three) times daily. 09/19/22     dorzolamide (TRUSOPT) 2 % ophthalmic solution Place 1 drop into the right eye 3 (three) times daily. 05/01/23     erythromycin ophthalmic ointment apply 2 times a day to affected eye(s) as directed 05/01/23     erythromycin ophthalmic ointment APPLY TWICE A DAY ONTO AFFECTED EYE(S) AS DIRECTED 08/01/23     ferrous gluconate (FERGON) 324 MG tablet 1 tab by mouth daily 11/09/22     ferrous gluconate (FERGON) 324 MG tablet Take 1 tablet (324 mg total) by mouth daily. 04/03/23     ferrous gluconate (FERGON) 324 MG tablet Take 2 tablets (648 mg total) by mouth daily. 07/16/23     finasteride (PROSCAR) 5 MG tablet Take 1 tablet (5 mg total) by mouth daily. 03/14/23     fluorometholone (FML) 0.1 % ophthalmic suspension Place 1 drop into the right eye 2 (two) times daily 05/01/23     fluticasone (FLONASE) 50 MCG/ACT nasal spray Inhale 2 sprays into both nostrils daily 04/23/22     fluticasone (FLONASE) 50 MCG/ACT nasal spray Place 2 sprays into both nostrils daily. 04/03/23     furosemide (LASIX) 20 MG tablet Take 1 tablet (20 mg total) by mouth daily. 07/10/23     guaiFENesin-dextromethorphan (ROBITUSSIN DM) 100-10 MG/5ML syrup Take 5 mLs by mouth every 4 (four) hours as needed for cough. 04/18/22   Drema Dallas, MD  latanoprost (XALATAN) 0.005 % ophthalmic solution Place 1 drop into both eyes at bedtime. 09/19/22     latanoprost (XALATAN) 0.005 % ophthalmic solution Place 1 drop into both eyes at bedtime 05/01/23     latanoprost (XALATAN) 0.005 % ophthalmic solution Place 1 drop into both eyes at bedtime. 06/03/23     metoprolol succinate (TOPROL-XL) 25 MG 24 hr tablet Take one tablet (25 mg dose) by mouth every 12 (twelve) hours. Please, hold Metoprolol if systolic BP is less than 105 or HR is less than 60. 09/11/22     metoprolol succinate (TOPROL-XL) 25 MG 24 hr tablet Take one tablet (25 mg dose) by mouth 2 (two) times daily. Please, hold Metoprolol if systolic BP is  less than 105 or HR is less than 60. 06/11/23     metoprolol succinate (TOPROL-XL) 25 MG 24 hr tablet Take 1 tablet (25 mg total) by mouth every 12 (twelve) hours. Please, hold Metoprolol if systolic BP is less than 105 or HR is less than 60. 07/03/23     omeprazole (PRILOSEC) 20 MG capsule Take 1 capsule (20 mg total) by mouth daily. 04/03/23     omeprazole (PRILOSEC) 20 MG capsule Take 1 capsule (20 mg total) by mouth daily. 08/16/23     pantoprazole (PROTONIX) 40 MG tablet Take 1 tablet (40 mg total) by mouth daily. 04/19/22  Drema Dallas, MD  Polyethyl Glycol-Propyl Glycol (SYSTANE) 0.4-0.3 % GEL ophthalmic gel Place 1 Application into both eyes as needed.    [provider]  predniSONE (DELTASONE) 20 MG tablet Take 3 tablets (60 mg total) by mouth daily for 5 days. 08/21/23 08/26/23  Amando Chaput, DO  ranolazine (RANEXA) 500 MG 12 hr tablet Take 1 tablet (500 mg total) by mouth 2 (two) times daily. 09/11/22     ranolazine (RANEXA) 500 MG 12 hr tablet Take 1 tablet (500 mg total) by mouth 2 (two) times daily. 03/11/23     ranolazine (RANEXA) 500 MG 12 hr tablet Take 1 tablet (500 mg total) by mouth 2 (two) times daily. 06/11/23     sulfamethoxazole-trimethoprim (BACTRIM DS) 800-160 MG tablet Take 1 tablet by mouth 2 (two) times daily. 06/08/23     tamsulosin (FLOMAX) 0.4 MG CAPS capsule Take 1 capsule (0.4 mg total) by mouth daily. 03/14/23     terbinafine (LAMISIL) 250 MG tablet Take 1 tablet (250 mg total) by mouth daily. 06/08/23     timolol (TIMOPTIC) 0.5 % ophthalmic solution Place 1 drop into both eyes 2 (two) times daily. 05/29/22     timolol (TIMOPTIC) 0.5 % ophthalmic solution Place 1 drop into both eyes 2 (two) times daily. 09/19/22     timolol (TIMOPTIC) 0.5 % ophthalmic solution Place 1 drop into both eyes 2 (two) times daily. 05/01/23     valsartan (DIOVAN) 40 MG tablet Take 1 tablet (40 mg total) by mouth daily. 06/01/22     valsartan (DIOVAN) 40 MG tablet Take 0.5 tablets (20 mg  total) by mouth daily. 11/09/22     valsartan (DIOVAN) 40 MG tablet Take 0.5 tablets (20 mg total) by mouth daily. Please hold Valsartan if systolic blood pressure less than 115. 06/11/23     valsartan (DIOVAN) 40 MG tablet Take 0.5 tablets (20 mg total) by mouth daily. 08/16/23     isosorbide mononitrate (IMDUR) 30 MG 24 hr tablet Take 1 tablet (30 mg total) by mouth daily. 11/19/22 12/24/22        Allergies    Patient has no known allergies.    Review of Systems   Review of Systems  Physical Exam Updated Vital Signs BP (!) 168/79   Pulse 61   Temp 99.4 F (37.4 C)   Resp 18   SpO2 100%  Physical Exam Vitals and nursing note reviewed.  Constitutional:      General: He is not in acute distress.    Appearance: He is well-developed. He is not ill-appearing.  HENT:     Head: Normocephalic and atraumatic.     Nose: Nose normal.     Mouth/Throat:     Mouth: Mucous membranes are moist.  Eyes:     Extraocular Movements: Extraocular movements intact.     Conjunctiva/sclera: Conjunctivae normal.     Pupils: Pupils are equal, round, and reactive to light.  Cardiovascular:     Rate and Rhythm: Normal rate and regular rhythm.     Pulses: Normal pulses.     Heart sounds: Normal heart sounds. No murmur heard. Pulmonary:     Effort: Pulmonary effort is normal. No respiratory distress.     Breath sounds: Wheezing present.  Abdominal:     Palpations: Abdomen is soft.     Tenderness: There is no abdominal tenderness.  Musculoskeletal:        General: No swelling.     Cervical back: Normal range of motion and  neck supple.  Skin:    General: Skin is warm and dry.     Capillary Refill: Capillary refill takes less than 2 seconds.  Neurological:     General: No focal deficit present.     Mental Status: He is alert.  Psychiatric:        Mood and Affect: Mood normal.     ED Results / Procedures / Treatments   Labs (all labs ordered are listed, but only abnormal results are  displayed) Labs Reviewed  CBC WITH DIFFERENTIAL/PLATELET - Abnormal; Notable for the following components:      Result Value   RBC 3.36 (*)    Hemoglobin 9.8 (*)    HCT 30.7 (*)    Lymphs Abs 0.5 (*)    All other components within normal limits  COMPREHENSIVE METABOLIC PANEL - Abnormal; Notable for the following components:   Sodium 134 (*)    Glucose, Bld 105 (*)    Calcium 8.6 (*)    Total Protein 6.3 (*)    Albumin 3.3 (*)    AST 13 (*)    All other components within normal limits  BRAIN NATRIURETIC PEPTIDE - Abnormal; Notable for the following components:   B Natriuretic Peptide 147.7 (*)    All other components within normal limits  RESP PANEL BY RT-PCR (RSV, FLU A&B, COVID)  [id number]  TROPONIN I (HIGH SENSITIVITY)    EKG EKG Interpretation Date/Time:  Wednesday August 21 2023 14:21:08 EDT Ventricular Rate:  60 PR Interval:  238 QRS Duration:  98 QT Interval:  465 QTC Calculation: 465 R Axis:   -31  Text Interpretation: Sinus rhythm Prolonged PR interval Confirmed by Virgina Norfolk [id number]) on 08/21/2023 5:32:50 PM  Radiology DG Chest 2 View Result Date: 08/21/2023 CLINICAL DATA:  Productive cough. EXAM: CHEST - 2 VIEW COMPARISON:  May 20, 2022 FINDINGS: The heart size and mediastinal contours are within normal limits. Mild atelectatic changes are seen within the bilateral lung bases. There is no evidence of focal consolidation, pleural effusion or pneumothorax. Multilevel degenerative changes are seen throughout the thoracic spine. IMPRESSION: Mild bibasilar atelectasis. Electronically Signed   By: Aram Candela M.D.   On: 08/21/2023 15:38    Procedures Procedures    Medications Ordered in ED Medications  ipratropium-albuterol (DUONEB) 0.5-2.5 (3) MG/3ML nebulizer solution 3 mL (3 mLs Nebulization Given 08/21/23 1721)    ED Course/ Medical Decision Making/ A&P                                 Medical Decision Making Risk Prescription drug  management.   Joby Amsden is here with shortness of breath cough.  Unremarkable  heart failure ACS.  EKG shows sinus rhythm.  No ischemic changes.vitals.  No fever.  Wheezing on exam.  No signs of major volume overload on exam.  History of COPD A-fib on Eliquis.  No increased work of breathing.  Overall appears comfortable.  Mild wheezing on exam.  Differential diagnosis likely asthma exacerbation/bronchitis seems less likely be ACS pneumonia.  Will check CBC BMP troponin BNP chest x-ray COVID flu RSV test.  Will give DuoNeb treatment.  He wants to hold off on taking steroid until he gets home due to fasting for Ramadan.  No concern for PE.  Patient is anticoagulated.  I have other reason for him to be short of breath.  Per my review and interpretation of labs is no significant anemia electrolyte  abnormality or troponin elevation or major elevation in BNP.  Chest x-ray fairly unremarkable.  No obvious pneumonia.  Patient is feeling better.  I do think that this is likely a bronchitis/COPD exacerbation.  Will prescribe prednisone Z-Pak albuterol.  Discharged in good condition.  Understands return precautions.  This chart was dictated using voice recognition software.  Despite best efforts to proofread,  errors can occur which can change the documentation meaning.         Final Clinical Impression(s) / ED Diagnoses Final diagnoses:  Acute bronchitis, unspecified organism    Rx / DC Orders ED Discharge Orders          Ordered    albuterol (VENTOLIN HFA) 108 (90 Base) MCG/ACT inhaler  Every 4 hours PRN        08/21/23 1719    albuterol (PROVENTIL) (2.5 MG/3ML) 0.083% nebulizer solution  Every 6 hours PRN        08/21/23 1719    predniSONE (DELTASONE) 10 MG tablet  Daily,   Status:  Discontinued        08/21/23 1720    azithromycin (ZITHROMAX) 250 MG tablet  Daily        08/21/23 1720    predniSONE (DELTASONE) 20 MG tablet  Daily,   Status:  Discontinued        08/21/23 1720     predniSONE (DELTASONE) 20 MG tablet  Daily,   Status:  Discontinued        08/21/23 1720    predniSONE (DELTASONE) 20 MG tablet  Daily        08/21/23 1728              Virgina Norfolk, DO 08/21/23 1734

## 2023-08-21 NOTE — ED Provider Triage Note (Signed)
 Emergency Medicine Provider Triage Evaluation Note  Bryan Sullivan , a 78 y.o. male  was evaluated in triage.  Pt complains of SOB for the past two days. Reprots that this feels like his ashtma. Has been using neb treatments q4h with some relief. Reports some chest tightness without pain. Has some mild lower leg edema which is typical for him. Cough with phelgm, no blood. No fevers.   Review of Systems  Positive:  Negative:   Physical Exam  BP (!) 160/75 (BP Location: Left Arm)   Pulse (!) 58   Temp 99.2 F (37.3 C) (Oral)   Resp 20   SpO2 100%  Gen:   Awake, no distress   Resp:  Normal effort  MSK:   Moves extremities without difficulty  Other:  Diminished at the bases. With slight exp wheezing ausculted. The patient does not appear in any acute distress.   Medical Decision Making  Medically screening exam initiated at 2:13 PM.  Appropriate orders placed.  Perley Boger was informed that the remainder of the evaluation will be completed by another provider, this initial triage assessment does not replace that evaluation, and the importance of remaining in the ED until their evaluation is complete.     Achille Rich, PA-C 08/21/23 1416

## 2023-08-21 NOTE — Discharge Instructions (Signed)
 Take next dose of prednisone tomorrow morning.  Take first dose of Zithromax tonight.  Recommend breathing treatment every 4-6 hours as needed.  Return if symptoms worsen.

## 2023-08-23 ENCOUNTER — Other Ambulatory Visit: Payer: Self-pay

## 2023-09-05 ENCOUNTER — Other Ambulatory Visit: Payer: Self-pay

## 2023-09-08 ENCOUNTER — Other Ambulatory Visit: Payer: Self-pay

## 2023-09-09 ENCOUNTER — Other Ambulatory Visit: Payer: Self-pay

## 2023-09-10 ENCOUNTER — Other Ambulatory Visit: Payer: Self-pay

## 2023-09-10 MED ORDER — TAMSULOSIN HCL 0.4 MG PO CAPS
0.4000 mg | ORAL_CAPSULE | Freq: Every day | ORAL | 1 refills | Status: AC
  Filled 2023-09-10 – 2024-03-18 (×2): qty 90, 90d supply, fill #0
  Filled 2024-06-22: qty 90, 90d supply, fill #1

## 2023-09-10 MED ORDER — ATORVASTATIN CALCIUM 40 MG PO TABS
40.0000 mg | ORAL_TABLET | Freq: Every day | ORAL | 1 refills | Status: AC
  Filled 2023-12-30: qty 90, 90d supply, fill #0
  Filled 2024-03-30 – 2024-06-22 (×2): qty 90, 90d supply, fill #1

## 2023-09-10 MED ORDER — FINASTERIDE 5 MG PO TABS
5.0000 mg | ORAL_TABLET | Freq: Every day | ORAL | 1 refills | Status: DC
  Filled 2023-09-10: qty 90, 90d supply, fill #0
  Filled 2023-12-30: qty 90, 90d supply, fill #1

## 2023-09-10 MED ORDER — ALBUTEROL SULFATE HFA 108 (90 BASE) MCG/ACT IN AERS
2.0000 | INHALATION_SPRAY | RESPIRATORY_TRACT | 1 refills | Status: DC | PRN
Start: 2023-09-10 — End: 2024-03-04

## 2023-09-10 MED ORDER — ALBUTEROL SULFATE HFA 108 (90 BASE) MCG/ACT IN AERS
2.0000 | INHALATION_SPRAY | RESPIRATORY_TRACT | 3 refills | Status: DC | PRN
Start: 2023-09-10 — End: 2024-03-05
  Filled 2023-09-10: qty 54, 51d supply, fill #0

## 2023-09-10 MED ORDER — TAMSULOSIN HCL 0.4 MG PO CAPS
0.4000 mg | ORAL_CAPSULE | Freq: Every day | ORAL | 1 refills | Status: DC
Start: 2023-09-10 — End: 2024-03-04
  Filled 2023-09-10: qty 90, 90d supply, fill #0
  Filled 2023-12-30: qty 90, 90d supply, fill #1

## 2023-09-10 MED ORDER — FINASTERIDE 5 MG PO TABS
5.0000 mg | ORAL_TABLET | Freq: Every day | ORAL | 1 refills | Status: AC
  Filled 2023-09-10 – 2024-03-18 (×2): qty 90, 90d supply, fill #0
  Filled 2024-06-22: qty 90, 90d supply, fill #1

## 2023-09-12 ENCOUNTER — Other Ambulatory Visit: Payer: Self-pay

## 2023-09-12 MED ORDER — ALBUTEROL SULFATE (2.5 MG/3ML) 0.083% IN NEBU
3.0000 mL | INHALATION_SOLUTION | RESPIRATORY_TRACT | 1 refills | Status: AC | PRN
Start: 2023-09-12 — End: ?
  Filled 2023-09-12: qty 75, 5d supply, fill #0

## 2023-09-13 ENCOUNTER — Other Ambulatory Visit: Payer: Self-pay

## 2023-12-24 ENCOUNTER — Other Ambulatory Visit (HOSPITAL_COMMUNITY): Payer: Self-pay

## 2023-12-30 ENCOUNTER — Other Ambulatory Visit: Payer: Self-pay

## 2024-01-06 ENCOUNTER — Other Ambulatory Visit: Payer: Self-pay

## 2024-01-07 ENCOUNTER — Other Ambulatory Visit: Payer: Self-pay

## 2024-01-08 ENCOUNTER — Other Ambulatory Visit: Payer: Self-pay

## 2024-01-14 ENCOUNTER — Other Ambulatory Visit: Payer: Self-pay

## 2024-01-14 MED ORDER — ERYTHROMYCIN 5 MG/GM OP OINT
1.0000 | TOPICAL_OINTMENT | Freq: Two times a day (BID) | OPHTHALMIC | 11 refills | Status: AC
  Filled 2024-01-14: qty 3.5, 15d supply, fill #0
  Filled 2024-02-17: qty 3.5, 15d supply, fill #1
  Filled 2024-03-18: qty 3.5, 15d supply, fill #2
  Filled 2024-03-26: qty 3.5, 15d supply, fill #3

## 2024-01-14 MED ORDER — ATROPINE SULFATE 1 % OP SOLN
1.0000 [drp] | Freq: Every day | OPHTHALMIC | 11 refills | Status: AC
  Filled 2024-01-14 – 2024-04-01 (×3): qty 2, 40d supply, fill #0
  Filled 2024-05-11: qty 2, 40d supply, fill #1
  Filled 2024-06-22: qty 2, 40d supply, fill #2

## 2024-02-06 ENCOUNTER — Other Ambulatory Visit (INDEPENDENT_AMBULATORY_CARE_PROVIDER_SITE_OTHER): Payer: Self-pay

## 2024-02-06 ENCOUNTER — Encounter: Payer: Self-pay | Admitting: Physician Assistant

## 2024-02-06 ENCOUNTER — Ambulatory Visit (INDEPENDENT_AMBULATORY_CARE_PROVIDER_SITE_OTHER): Admitting: Physician Assistant

## 2024-02-06 DIAGNOSIS — M25561 Pain in right knee: Secondary | ICD-10-CM | POA: Diagnosis not present

## 2024-02-06 DIAGNOSIS — G8929 Other chronic pain: Secondary | ICD-10-CM | POA: Diagnosis not present

## 2024-02-06 DIAGNOSIS — M25562 Pain in left knee: Secondary | ICD-10-CM | POA: Diagnosis not present

## 2024-02-06 NOTE — Progress Notes (Signed)
 Office Visit Note   Patient: Bryan Sullivan           Date of Birth: Oct 22, 1945           MRN: [id number] Visit Date: 02/06/2024              Requested by: Bryan Francisco, MD [address] [place],  [place] [address] PCP: Bryan Francisco, MD   Assessment & Plan: Visit Diagnoses:  1. Chronic pain of right knee   2. Chronic pain of left knee     Plan:  Discussed with him that he has mild to moderate arthritis of both knees.  Discussed knee friendly exercises with him.  He is shown quad strengthening exercises.  Discussed with him sending formal therapy he defers.  Also offered him cortisone injections both knees he defers.  Therefore he will follow-up with us  as needed.  Questions encouraged and answered  Follow-Up Instructions: Return if symptoms worsen or fail to improve.   Orders:  Orders Placed This Encounter  Procedures   XR Knee 1-2 Views Right   XR Knee 1-2 Views Left   No orders of the defined types were placed in this encounter.     Procedures: No procedures performed   Clinical Data: No additional findings.   Subjective: Chief Complaint  Patient presents with   Left Knee - Pain   Right Knee - Pain    HPI Bryan Sullivan comes in today due to bilateral knee pain.  He somewhat has been seen in our office before but it has been close to 3 years since he was last seen.  He is having bilateral knee pain.  No known injury.  He states that his right knee feels weak and throbs.  He is using a cane.  Ranks his right knee pain 6 out of 10 at worst in his left knee pain 5 out of 10 at worst.  He has a history of acute renal injury and also is on chronic anticoagulation due to atrial fibrillation.  He is nondiabetic.  Denies any mechanical symptoms of either knee.  Review of Systems See HPI  Objective: Vital Signs: There were no vitals taken for this visit.  Physical Exam Constitutional:      Appearance: He is not ill-appearing or diaphoretic.  Pulmonary:      Effort: Pulmonary effort is normal.  Neurological:     Mental Status: He is alert and oriented to person, place, and time.  Psychiatric:        Mood and Affect: Mood normal.     Ortho Exam Bilateral knees: No abnormal warmth erythema or effusion.  Has tenderness along medial lateral joint line of both knees.  Manipulation patella causes discomfort.  Patellofemoral crepitus bilaterally.  McMurray's is negative bilaterally.  Valgus varus stressing negative bilaterally.  Specialty Comments:  No specialty comments available.  Imaging: XR Knee 1-2 Views Right Result Date: 02/06/2024 Right knee: There is medial and lateral joint lines consistent with mild to moderate arthritic changes.  Mild patellofemoral changes.  Knee is well located.  No acute fractures acute findings.  No bony lesions.  XR Knee 1-2 Views Left Result Date: 02/06/2024 Left knee 2 views: Shows mild patellofemoral arthritic changes and mild to moderate medial compartmental narrowing lateral compartment is overall well-preserved.  No acute fractures.  Knee is well located.  No bony abnormalities otherwise.    PMFS History: Patient Active Problem List   Diagnosis Date Noted   Paroxysmal atrial fibrillation (HCC) 04/17/2022  Mixed hyperlipidemia 04/17/2022   Pneumonia of left lower lobe due to infectious organism 04/16/2022   Malignant neoplasm of prostate (HCC) 03/27/2022   Coronary artery disease involving native coronary artery of native heart without angina pectoris 06/10/2020   AKI (acute kidney injury) (HCC)    Acute exacerbation of COPD with asthma (HCC) 09/18/2018   Gastroesophageal reflux disease 10/08/2016   Glaucoma (increased eye pressure) 12/16/2014   BPH (benign prostatic hyperplasia) 11/24/2014   DJD (degenerative joint disease) of knee 11/24/2014   Chronic obstructive airway disease with asthma (HCC) 10/11/2014   Essential hypertension 09/22/2014   Open angle with borderline findings and high glaucoma  risk in left eye 08/24/2014   After cataract 02/10/2013   Lumbar radiculopathy 12/30/2012   Pseudophakia of both eyes 07/29/2012   Corneal transplant failure 07/15/2012   Past Medical History:  Diagnosis Date   Anticoagulant long-term use    eliquis ---  managed by cardiology   BPH (benign prostatic hyperplasia) 2001   CAP (community acquired pneumonia)    admission in epic   04-16-2022 LLL w/ COPD exacerbation;   ED visit 05-20-2022 dx CAP RLL and Flu A   Chronic chest pain    followed by cardiology-- Bryan Sullivan;   per note chronic stable atypical taking imdur  and ranaxa daily   CKD (chronic kidney disease), stage II    COPD with asthma (HCC)    Coronary artery disease    cardiologist--- Bryan Bryan Sullivan;   mild CAD nonobstructive -- coronary CT 10/ 2023 in care everywhere   First degree heart block    Full dentures    Hypertension    Dx at age 50   Lumbar radiculopathy    Malignant neoplasm prostate Bryan Sullivan) 02/2022   urologist--  Bryan Sullivan/  radiation onologist---  Bryan Bryan Sullivan,   dx 10/ 2023  Gleason 4+4   Neurotrophic keratopathy of right eye    OA (osteoarthritis)    Open angle with borderline findings and high glaucoma risk in left eye    PAF (paroxysmal atrial fibrillation) (HCC) 03/2022   cardiologist--- Bryan Sullivan   Primary open angle glaucoma of right eye, severe stage    Wears glasses     Family History  Problem Relation Age of Onset   Diabetes Mother    Asthma Father    Hypertension Father    Asthma Sister     Past Surgical History:  Procedure Laterality Date   CATARACT EXTRACTION W/ INTRAOCULAR LENS  IMPLANT, BILATERAL Bilateral    right 2001;  left 2003   CORNEAL TRANSPLANT Right    02/ 2011 and   11-04-2012    both @ [facility]   GOLD SEED IMPLANT N/A 06/21/2022   Procedure: GOLD SEED IMPLANT;  Surgeon: Bryan Glance, MD;  Location: [facility];  Service: Urology;  Laterality: N/A;   SPACE OAR INSTILLATION N/A 06/21/2022   Procedure: SPACE  OAR INSTILLATION;  Surgeon: Bryan Glance, MD;  Location: [facility];  Service: Urology;  Laterality: N/A;   Social History   Occupational History   Not on file  Tobacco Use   Smoking status: Former    Current packs/day: 0.00    Types: Cigarettes    Start date: 09/22/1958    Quit date: 09/21/1968    Years since quitting: 55.4   Smokeless tobacco: Never  Vaping Use   Vaping status: Never Used  Substance and Sexual Activity   Alcohol  use: No   Drug use: Never   Sexual  activity: Not on file

## 2024-02-17 ENCOUNTER — Other Ambulatory Visit: Payer: Self-pay

## 2024-02-20 ENCOUNTER — Other Ambulatory Visit: Payer: Self-pay

## 2024-03-03 ENCOUNTER — Encounter (HOSPITAL_COMMUNITY): Payer: Self-pay

## 2024-03-03 ENCOUNTER — Emergency Department (HOSPITAL_COMMUNITY)

## 2024-03-03 ENCOUNTER — Inpatient Hospital Stay (HOSPITAL_COMMUNITY)
Admission: EM | Admit: 2024-03-03 | Discharge: 2024-03-08 | DRG: 291 | Disposition: A | Discharge status: Home / Self Care | Attending: Family Medicine | Admitting: Family Medicine

## 2024-03-03 ENCOUNTER — Other Ambulatory Visit: Payer: Self-pay

## 2024-03-03 DIAGNOSIS — I4821 Permanent atrial fibrillation: Secondary | ICD-10-CM | POA: Diagnosis present

## 2024-03-03 DIAGNOSIS — N182 Chronic kidney disease, stage 2 (mild): Secondary | ICD-10-CM | POA: Diagnosis present

## 2024-03-03 DIAGNOSIS — I2510 Atherosclerotic heart disease of native coronary artery without angina pectoris: Secondary | ICD-10-CM | POA: Diagnosis present

## 2024-03-03 DIAGNOSIS — Z833 Family history of diabetes mellitus: Secondary | ICD-10-CM

## 2024-03-03 DIAGNOSIS — H401113 Primary open-angle glaucoma, right eye, severe stage: Secondary | ICD-10-CM | POA: Diagnosis present

## 2024-03-03 DIAGNOSIS — I160 Hypertensive urgency: Principal | ICD-10-CM | POA: Diagnosis present

## 2024-03-03 DIAGNOSIS — I493 Ventricular premature depolarization: Secondary | ICD-10-CM | POA: Diagnosis present

## 2024-03-03 DIAGNOSIS — Z87891 Personal history of nicotine dependence: Secondary | ICD-10-CM

## 2024-03-03 DIAGNOSIS — I10 Essential (primary) hypertension: Secondary | ICD-10-CM | POA: Diagnosis present

## 2024-03-03 DIAGNOSIS — I5033 Acute on chronic diastolic (congestive) heart failure: Secondary | ICD-10-CM | POA: Diagnosis present

## 2024-03-03 DIAGNOSIS — I130 Hypertensive heart and chronic kidney disease with heart failure and stage 1 through stage 4 chronic kidney disease, or unspecified chronic kidney disease: Principal | ICD-10-CM | POA: Diagnosis present

## 2024-03-03 DIAGNOSIS — R7989 Other specified abnormal findings of blood chemistry: Secondary | ICD-10-CM

## 2024-03-03 DIAGNOSIS — N400 Enlarged prostate without lower urinary tract symptoms: Secondary | ICD-10-CM | POA: Diagnosis present

## 2024-03-03 DIAGNOSIS — Z7901 Long term (current) use of anticoagulants: Secondary | ICD-10-CM

## 2024-03-03 DIAGNOSIS — Z961 Presence of intraocular lens: Secondary | ICD-10-CM | POA: Diagnosis present

## 2024-03-03 DIAGNOSIS — Z9842 Cataract extraction status, left eye: Secondary | ICD-10-CM

## 2024-03-03 DIAGNOSIS — Y92239 Unspecified place in hospital as the place of occurrence of the external cause: Secondary | ICD-10-CM | POA: Diagnosis not present

## 2024-03-03 DIAGNOSIS — N170 Acute kidney failure with tubular necrosis: Secondary | ICD-10-CM | POA: Diagnosis not present

## 2024-03-03 DIAGNOSIS — Z923 Personal history of irradiation: Secondary | ICD-10-CM

## 2024-03-03 DIAGNOSIS — Z8616 Personal history of COVID-19: Secondary | ICD-10-CM

## 2024-03-03 DIAGNOSIS — E782 Mixed hyperlipidemia: Secondary | ICD-10-CM | POA: Diagnosis present

## 2024-03-03 DIAGNOSIS — R0602 Shortness of breath: Secondary | ICD-10-CM

## 2024-03-03 DIAGNOSIS — G8929 Other chronic pain: Secondary | ICD-10-CM | POA: Diagnosis present

## 2024-03-03 DIAGNOSIS — Z825 Family history of asthma and other chronic lower respiratory diseases: Secondary | ICD-10-CM

## 2024-03-03 DIAGNOSIS — T502X5A Adverse effect of carbonic-anhydrase inhibitors, benzothiadiazides and other diuretics, initial encounter: Secondary | ICD-10-CM | POA: Diagnosis not present

## 2024-03-03 DIAGNOSIS — Z8546 Personal history of malignant neoplasm of prostate: Secondary | ICD-10-CM

## 2024-03-03 DIAGNOSIS — Z79899 Other long term (current) drug therapy: Secondary | ICD-10-CM

## 2024-03-03 DIAGNOSIS — Z8249 Family history of ischemic heart disease and other diseases of the circulatory system: Secondary | ICD-10-CM

## 2024-03-03 DIAGNOSIS — J441 Chronic obstructive pulmonary disease with (acute) exacerbation: Secondary | ICD-10-CM | POA: Diagnosis present

## 2024-03-03 DIAGNOSIS — Z9841 Cataract extraction status, right eye: Secondary | ICD-10-CM

## 2024-03-03 DIAGNOSIS — I480 Paroxysmal atrial fibrillation: Secondary | ICD-10-CM | POA: Diagnosis present

## 2024-03-03 DIAGNOSIS — Z947 Corneal transplant status: Secondary | ICD-10-CM

## 2024-03-03 DIAGNOSIS — H409 Unspecified glaucoma: Secondary | ICD-10-CM | POA: Diagnosis present

## 2024-03-03 DIAGNOSIS — J45901 Unspecified asthma with (acute) exacerbation: Secondary | ICD-10-CM | POA: Diagnosis present

## 2024-03-03 LAB — BASIC METABOLIC PANEL WITH GFR
Anion gap: 8 (ref 5–15)
BUN: 17 mg/dL (ref 8–23)
CO2: 25 mmol/L (ref 22–32)
Calcium: 8.5 mg/dL — ABNORMAL LOW (ref 8.9–10.3)
Chloride: 104 mmol/L (ref 98–111)
Creatinine, Ser: 1.16 mg/dL (ref 0.61–1.24)
GFR, Estimated: >60 mL/min (ref >60)
Glucose, Bld: 109 mg/dL — ABNORMAL HIGH (ref 70–99)
Potassium: 4 mmol/L (ref 3.5–5.1)
Sodium: 137 mmol/L (ref 135–145)

## 2024-03-03 LAB — CBC WITH DIFFERENTIAL/PLATELET
Abs Immature Granulocytes: 0.01 K/uL (ref 0.00–0.07)
Basophils Absolute: 0 K/uL (ref 0.0–0.1)
Basophils Relative: 1 %
Eosinophils Absolute: 0.1 K/uL (ref 0.0–0.5)
Eosinophils Relative: 2 %
HCT: 30.4 % — ABNORMAL LOW (ref 39.0–52.0)
Hemoglobin: 9.3 g/dL — ABNORMAL LOW (ref 13.0–17.0)
Immature Granulocytes: 0 %
Lymphocytes Relative: 19 %
Lymphs Abs: 0.8 K/uL (ref 0.7–4.0)
MCH: 27.4 pg (ref 26.0–34.0)
MCHC: 30.6 g/dL (ref 30.0–36.0)
MCV: 89.4 fL (ref 80.0–100.0)
Monocytes Absolute: 0.4 K/uL (ref 0.1–1.0)
Monocytes Relative: 10 %
Neutro Abs: 2.8 K/uL (ref 1.7–7.7)
Neutrophils Relative %: 68 %
Platelets: 200 K/uL (ref 150–400)
RBC: 3.4 MIL/uL — ABNORMAL LOW (ref 4.22–5.81)
RDW: 13.4 % (ref 11.5–15.5)
WBC: 4 K/uL (ref 4.0–10.5)
nRBC: 0 % (ref 0.0–0.2)

## 2024-03-03 LAB — TROPONIN I (HIGH SENSITIVITY)
Troponin I (High Sensitivity): 13 ng/L (ref <18)
Troponin I (High Sensitivity): 14 ng/L (ref <18)

## 2024-03-03 LAB — BRAIN NATRIURETIC PEPTIDE: B Natriuretic Peptide: 193.6 pg/mL — ABNORMAL HIGH (ref 0.0–100.0)

## 2024-03-03 MED ORDER — FUROSEMIDE 10 MG/ML IJ SOLN
40.0000 mg | Freq: Once | INTRAMUSCULAR | Status: AC
Start: 2024-03-03 — End: 2024-03-03
  Administered 2024-03-03: 40 mg via INTRAVENOUS
  Filled 2024-03-03: qty 4

## 2024-03-03 MED ORDER — IPRATROPIUM-ALBUTEROL 0.5-2.5 (3) MG/3ML IN SOLN
6.0000 mL | Freq: Once | RESPIRATORY_TRACT | Status: AC
  Administered 2024-03-03: 6 mL via RESPIRATORY_TRACT
  Filled 2024-03-03: qty 3

## 2024-03-03 MED ORDER — METHYLPREDNISOLONE SODIUM SUCC 125 MG IJ SOLR
125.0000 mg | INTRAMUSCULAR | Status: AC
  Administered 2024-03-03: 125 mg via INTRAVENOUS
  Filled 2024-03-03: qty 2

## 2024-03-03 MED ORDER — IRBESARTAN 150 MG PO TABS: 75.0000 mg | ORAL_TABLET | Freq: Every day | ORAL | Status: DC

## 2024-03-03 MED ORDER — IRBESARTAN 150 MG PO TABS
75.0000 mg | ORAL_TABLET | Freq: Every day | ORAL | Status: DC
  Administered 2024-03-04 (×2): 75 mg via ORAL
  Filled 2024-03-03 (×2): qty 1

## 2024-03-03 MED ORDER — METOPROLOL SUCCINATE ER 25 MG PO TB24
25.0000 mg | ORAL_TABLET | Freq: Two times a day (BID) | ORAL | Status: DC
  Administered 2024-03-04 (×2): 25 mg via ORAL
  Filled 2024-03-03 (×2): qty 1

## 2024-03-03 NOTE — ED Provider Notes (Signed)
 [place] [facility] DEPARTMENT AT [facility] Provider Note   CSN: [id number] Arrival date & time: 03/03/24  1447     Patient presents with: Chest Pain and Shortness of Breath   Uri Masayoshi Couzens is a 78 y.o. male.  {Add pertinent medical, surgical, social history, OB history to HPI:3678} 78 year old male with a history of CHF, COPD not on home oxygen , hypertension, CKD, atrial fibrillation on Eliquis , and lower extremity edema on Lasix  who presents emergency department shortness of breath.  Reports that over the past 3 days had worsening shortness of breath.  Says that he is not have any chest pain.  No fevers.  Mild cough.  Also had some mild lower extremity swelling.  Has been trying his inhaler at home without relief.  Also reports chest tightness       Prior to Admission medications   Medication Sig Start Date End Date Taking? Authorizing Provider  Acetaminophen  (MAPAP) 500 MG capsule 1 (one) capsule by mouth every six hours, as needed, as needed for pain 09/17/22     acetaminophen  (TYLENOL ) 500 MG tablet Take 500 mg by mouth as needed for moderate pain.    [provider]  albuterol  (PROVENTIL ) (2.5 MG/3ML) 0.083% nebulizer solution Take 3 mLs (2.5 mg total) by nebulization every 6 (six) hours as needed for wheezing or shortness of breath. 09/12/21 02/06/24  Fleming, Zelda W, NP  albuterol  (PROVENTIL ) (2.5 MG/3ML) 0.083% nebulizer solution Take 3 mLs (2.5 mg total) by nebulization every 6 (six) hours as needed for wheezing or shortness of breath. 08/21/23 02/06/24  Curatolo, Adam, DO  albuterol  (PROVENTIL ) (2.5 MG/3ML) 0.083% nebulizer solution Inhale 3 mLs into the lungs every 4 (four) hours as needed. 09/12/23     albuterol  (VENTOLIN  HFA) 108 (90 Base) MCG/ACT inhaler Inhale 2 puffs into the lungs every 4 (four) hours as needed for shortness of breath or wheezing. 08/21/23   Curatolo, Adam, DO  albuterol  (VENTOLIN  HFA) 108 (90 Base) MCG/ACT inhaler Inhale 2  puffs into the lungs every 4 (four) hours as needed. 09/10/23     albuterol  (VENTOLIN  HFA) 108 (90 Base) MCG/ACT inhaler Inhale 2 puffs into the lungs every 4 (four) hours as needed. 09/10/23     apixaban  (ELIQUIS ) 5 MG TABS tablet Take 1 tablet (5 mg total) by mouth 2 (two) times daily. 07/12/22     apixaban  (ELIQUIS ) 5 MG TABS tablet Take 1 tablet (5 mg total) by mouth 2 (two) times daily. 09/11/22     apixaban  (ELIQUIS ) 5 MG TABS tablet Take 1 tablet (5 mg total) by mouth 2 (two) times daily. 05/13/23     apixaban  (ELIQUIS ) 5 MG TABS tablet Take 1 tablet (5 mg total) by mouth 2 (two) times daily. 06/11/23     atorvastatin  (LIPITOR) 40 MG tablet Take 1 tablet (40 mg total) by mouth daily. Patient taking differently: Take 40 mg by mouth daily. 06/13/22     atorvastatin  (LIPITOR) 40 MG tablet Take 1 tablet (40 mg total) by mouth daily. 03/14/23     atorvastatin  (LIPITOR) 40 MG tablet Take 1 tablet (40 mg total) by mouth daily. 06/11/23     atorvastatin  (LIPITOR) 40 MG tablet Take 1 tablet (40 mg total) by mouth daily. 09/10/23     atropine  1 % ophthalmic solution Place 1 drop into the right eye daily as needed. 09/19/22     atropine  1 % ophthalmic solution Place 1 drop into the right eye daily as needed. 05/01/23  atropine  1 % ophthalmic solution Place 1 drop into the right eye daily as needed. 08/01/23     atropine  1 % ophthalmic solution Place 1 drop into the right eye daily as needed 01/14/24     azithromycin  (ZITHROMAX ) 250 MG tablet Take 1 tablet (250 mg total) by mouth daily. Take first 2 tablets together, then 1 every day until finished. 08/21/23   Curatolo, Adam, DO  Budeson-Glycopyrrol-Formoterol  (BREZTRI  AEROSPHERE) 160-9-4.8 MCG/ACT AERO Inhale 2 puffs into the lungs in the morning and in the evening. 07/06/22     Budeson-Glycopyrrol-Formoterol  160-9-4.8 MCG/ACT AERO Inhale 2 puffs into the lungs 2 (two) times daily (morning and evening). Patient taking differently: Inhale 2 puffs into the lungs 2  (two) times daily. 06/18/22     budesonide -glycopyrrolate -formoterol  (BREZTRI  AEROSPHERE) 160-9-4.8 MCG/ACT AERO inhaler Inhale 2 puffs into the lungs 2 (two) times daily. 02/19/23     diclofenac  Sodium (VOLTAREN ) 1 % GEL Apply topically as needed.    [provider]  diclofenac  Sodium (VOLTAREN ) 1 % GEL Apply 1 Application topically 2 (two) times daily as needed. 12/19/22     diclofenac  Sodium (VOLTAREN ) 1 % GEL Apply 1 Application topically 2 (two) times daily as needed. 04/03/23     dorzolamide  (TRUSOPT ) 2 % ophthalmic solution Place 1 drop into the right eye 3 (three) times daily Patient taking differently: Place 1 drop into the right eye 3 (three) times daily. 03/14/22     dorzolamide  (TRUSOPT ) 2 % ophthalmic solution Place 1 drop into the right eye 3 (three) times daily. 09/19/22     dorzolamide  (TRUSOPT ) 2 % ophthalmic solution Place 1 drop into the right eye 3 (three) times daily. 05/01/23     erythromycin  ophthalmic ointment apply 2 times a day to affected eye(s) as directed 05/01/23     erythromycin  ophthalmic ointment APPLY TWICE A DAY ONTO AFFECTED EYE(S) AS DIRECTED 08/01/23     erythromycin  ophthalmic ointment Place 1 Application into affected eyes 2 (two) times daily as directed. 01/14/24     ferrous gluconate  (FERGON) 324 MG tablet 1 tab by mouth daily 11/09/22     ferrous gluconate  (FERGON) 324 MG tablet Take 1 tablet (324 mg total) by mouth daily. 04/03/23     ferrous gluconate  (FERGON) 324 MG tablet Take 2 tablets (648 mg total) by mouth daily. 07/16/23     finasteride  (PROSCAR ) 5 MG tablet Take 1 tablet (5 mg total) by mouth daily. 09/10/23     finasteride  (PROSCAR ) 5 MG tablet Take 1 tablet (5 mg total) by mouth daily. 09/10/23     fluorometholone  (FML) 0.1 % ophthalmic suspension Place 1 drop into the right eye 2 (two) times daily 05/01/23     fluticasone  (FLONASE ) 50 MCG/ACT nasal spray Inhale 2 sprays into both nostrils daily 04/23/22     fluticasone  (FLONASE ) 50 MCG/ACT nasal  spray Place 2 sprays into both nostrils daily. 04/03/23     furosemide  (LASIX ) 20 MG tablet Take 1 tablet (20 mg total) by mouth daily. 07/10/23     guaiFENesin -dextromethorphan  (ROBITUSSIN DM) 100-10 MG/5ML syrup Take 5 mLs by mouth every 4 (four) hours as needed for cough. 04/18/22   Milissa Tod PARAS, MD  latanoprost  (XALATAN ) 0.005 % ophthalmic solution Place 1 drop into both eyes at bedtime. 09/19/22     latanoprost  (XALATAN ) 0.005 % ophthalmic solution Place 1 drop into both eyes at bedtime 05/01/23     latanoprost  (XALATAN ) 0.005 % ophthalmic solution Place 1 drop into both eyes at bedtime. 06/03/23  metoprolol  succinate (TOPROL -XL) 25 MG 24 hr tablet Take one tablet (25 mg dose) by mouth every 12 (twelve) hours. Please, hold Metoprolol  if systolic BP is less than 105 or HR is less than 60. 09/11/22     metoprolol  succinate (TOPROL -XL) 25 MG 24 hr tablet Take one tablet (25 mg dose) by mouth 2 (two) times daily. Please, hold Metoprolol  if systolic BP is less than 105 or HR is less than 60. 06/11/23     metoprolol  succinate (TOPROL -XL) 25 MG 24 hr tablet Take 1 tablet (25 mg total) by mouth every 12 (twelve) hours. Please, hold Metoprolol  if systolic BP is less than 105 or HR is less than 60. 07/03/23     omeprazole  (PRILOSEC) 20 MG capsule Take 1 capsule (20 mg total) by mouth daily. 04/03/23     omeprazole  (PRILOSEC) 20 MG capsule Take 1 capsule (20 mg total) by mouth daily. 08/16/23     pantoprazole  (PROTONIX ) 40 MG tablet Take 1 tablet (40 mg total) by mouth daily. 04/19/22   Milissa Tod PARAS, MD  Polyethyl Glycol-Propyl Glycol (SYSTANE) 0.4-0.3 % GEL ophthalmic gel Place 1 Application into both eyes as needed.    [provider]  ranolazine  (RANEXA ) 500 MG 12 hr tablet Take 1 tablet (500 mg total) by mouth 2 (two) times daily. 09/11/22     ranolazine  (RANEXA ) 500 MG 12 hr tablet Take 1 tablet (500 mg total) by mouth 2 (two) times daily. 03/11/23     ranolazine  (RANEXA ) 500 MG 12 hr tablet Take  1 tablet (500 mg total) by mouth 2 (two) times daily. 06/11/23     sulfamethoxazole -trimethoprim  (BACTRIM  DS) 800-160 MG tablet Take 1 tablet by mouth 2 (two) times daily. 06/08/23     tamsulosin  (FLOMAX ) 0.4 MG CAPS capsule Take 1 capsule (0.4 mg total) by mouth daily. 09/10/23     tamsulosin  (FLOMAX ) 0.4 MG CAPS capsule Take 1 capsule (0.4 mg total) by mouth daily. 09/10/23     terbinafine  (LAMISIL ) 250 MG tablet Take 1 tablet (250 mg total) by mouth daily. 06/08/23     timolol  (TIMOPTIC ) 0.5 % ophthalmic solution Place 1 drop into both eyes 2 (two) times daily. 05/29/22     timolol  (TIMOPTIC ) 0.5 % ophthalmic solution Place 1 drop into both eyes 2 (two) times daily. 09/19/22     timolol  (TIMOPTIC ) 0.5 % ophthalmic solution Place 1 drop into both eyes 2 (two) times daily. 05/01/23     valsartan  (DIOVAN ) 40 MG tablet Take 1 tablet (40 mg total) by mouth daily. 06/01/22     valsartan  (DIOVAN ) 40 MG tablet Take 0.5 tablets (20 mg total) by mouth daily. 11/09/22     valsartan  (DIOVAN ) 40 MG tablet Take 0.5 tablets (20 mg total) by mouth daily. Please hold Valsartan  if systolic blood pressure less than 115. 06/11/23     valsartan  (DIOVAN ) 40 MG tablet Take 0.5 tablets (20 mg total) by mouth daily. 08/16/23     isosorbide  mononitrate (IMDUR ) 30 MG 24 hr tablet Take 1 tablet (30 mg total) by mouth daily. 11/19/22 12/24/22      Allergies: Patient has no known allergies.    Review of Systems  Updated Vital Signs BP (!) 166/96 (BP Location: Right Arm)   Pulse 68   Temp 97.9 F (36.6 C)   Resp 16   Ht 5' 6 (1.676 m)   SpO2 100%   BMI 28.94 kg/m   Physical Exam Vitals and nursing note reviewed.  Constitutional:  General: He is not in acute distress.    Appearance: He is well-developed.  HENT:     Head: Normocephalic and atraumatic.     Right Ear: External ear normal.     Left Ear: External ear normal.     Nose: Nose normal.  Eyes:     Extraocular Movements: Extraocular movements intact.      Conjunctiva/sclera: Conjunctivae normal.     Pupils: Pupils are equal, round, and reactive to light.  Cardiovascular:     Rate and Rhythm: Normal rate and regular rhythm.     Heart sounds: Normal heart sounds.  Pulmonary:     Effort: Pulmonary effort is normal. No respiratory distress.     Comments: Diminished in the bases Musculoskeletal:     Cervical back: Normal range of motion and neck supple.     Right lower leg: Edema present.     Left lower leg: Edema present.  Skin:    General: Skin is warm and dry.  Neurological:     Mental Status: He is alert. Mental status is at baseline.  Psychiatric:        Mood and Affect: Mood normal.        Behavior: Behavior normal.     (all labs ordered are listed, but only abnormal results are displayed) Labs Reviewed  BRAIN NATRIURETIC PEPTIDE - Abnormal; Notable for the following components:      Result Value   B Natriuretic Peptide 193.6 (*)    All other components within normal limits  BASIC METABOLIC PANEL WITH GFR - Abnormal; Notable for the following components:   Glucose, Bld 109 (*)    Calcium  8.5 (*)    All other components within normal limits  CBC WITH DIFFERENTIAL/PLATELET - Abnormal; Notable for the following components:   RBC 3.40 (*)    Hemoglobin 9.3 (*)    HCT 30.4 (*)    All other components within normal limits  TROPONIN I (HIGH SENSITIVITY)  TROPONIN I (HIGH SENSITIVITY)    EKG: EKG Interpretation Date/Time:  Tuesday March 03 2024 14:58:26 EDT Ventricular Rate:  53 PR Interval:    QRS Duration:  90 QT Interval:  442 QTC Calculation: 414 R Axis:   -28  Text Interpretation: Junctional rhythm Nonspecific ST and T wave abnormality Abnormal ECG When compared with ECG of 21-Aug-2023 14:21, possible rhythm change Confirmed by Towana Sharper [phone]) on 03/03/2024 3:01:47 PM  Radiology: [facility] Chest 2 View Result Date: 03/03/2024 CLINICAL DATA:  sob EXAM: CHEST - 2 VIEW COMPARISON:  Chest x-ray 08/21/2023 FINDINGS:  The heart and mediastinal contours are within normal limits. No focal consolidation. Chronic coarsened interstitial markings with no overt pulmonary edema. Small left pleural effusion. Trace right pleural effusion. No pneumothorax. No acute osseous abnormality. IMPRESSION: Small left pleural effusion. Trace right pleural effusion. Electronically Signed   By: Morgane  Naveau M.D.   On: 03/03/2024 18:56    {Document cardiac monitor, telemetry assessment procedure when appropriate:32947} Procedures   Medications Ordered in the ED  methylPREDNISolone  sodium succinate (SOLU-MEDROL ) 125 mg/2 mL injection 125 mg (has no administration in time range)  ipratropium-albuterol  (DUONEB) 0.5-2.5 (3) MG/3ML nebulizer solution 6 mL (has no administration in time range)  furosemide  (LASIX ) injection 40 mg (has no administration in time range)      {Click here for ABCD2, HEART and other calculators REFRESH Note before signing:1}  Medical Decision Making Risk Prescription drug management.   ***  {Document critical care time when appropriate  Document review of labs and clinical decision tools ie CHADS2VASC2, etc  Document your independent review of radiology images and any outside records  Document your discussion with family members, caretakers and with consultants  Document social determinants of health affecting pt's care  Document your decision making why or why not admission, treatments were needed:32947:::1}   Final diagnoses:  None    ED Discharge Orders     None

## 2024-03-03 NOTE — ED Triage Notes (Addendum)
 Pt having chest pain and SOB over last week worse with exertion. Pt denies any pertinent cardiac history. Sob increasingly worse today.

## 2024-03-03 NOTE — ED Provider Triage Note (Signed)
 Emergency Medicine Provider Triage Evaluation Note  Bryan Sullivan , a 78 y.o. male  was evaluated in triage.  Pt complains of shortness of breath for 3 to 4 days, nonproductive cough.  No fever no hemoptysis..  Review of Systems  Positive: Cough Negative: Fever  Physical Exam  BP (!) 176/79 (BP Location: Right Arm)   Pulse (!) 57   Temp 98 F (36.7 C)   Resp 16   Ht 5' 6 (1.676 m)   SpO2 100%   BMI 28.94 kg/m  Gen:   Awake, no distress   Resp:  Normal effort  -bilateral rhonchi MSK:   Moves extremities without difficulty, does have peripheral edema Other:    Medical Decision Making  Medically screening exam initiated at 3:11 PM.  Appropriate orders placed.  Bryan Sullivan was informed that the remainder of the evaluation will be completed by another provider, this initial triage assessment does not replace that evaluation, and the importance of remaining in the ED until their evaluation is complete.     Bryan Ozell BROCKS, MD 03/03/24 [phone]

## 2024-03-03 NOTE — ED Triage Notes (Signed)
 Pt c/o SHOB for past 2 days. Hx of asthma. Tried using inhaler with no relief. Also reports chest pain that started 3 days ago.

## 2024-03-04 DIAGNOSIS — K219 Gastro-esophageal reflux disease without esophagitis: Secondary | ICD-10-CM

## 2024-03-04 DIAGNOSIS — I482 Chronic atrial fibrillation, unspecified: Secondary | ICD-10-CM

## 2024-03-04 DIAGNOSIS — R7989 Other specified abnormal findings of blood chemistry: Secondary | ICD-10-CM

## 2024-03-04 DIAGNOSIS — N400 Enlarged prostate without lower urinary tract symptoms: Secondary | ICD-10-CM

## 2024-03-04 DIAGNOSIS — J45901 Unspecified asthma with (acute) exacerbation: Secondary | ICD-10-CM | POA: Diagnosis not present

## 2024-03-04 DIAGNOSIS — J441 Chronic obstructive pulmonary disease with (acute) exacerbation: Secondary | ICD-10-CM | POA: Diagnosis not present

## 2024-03-04 DIAGNOSIS — I2510 Atherosclerotic heart disease of native coronary artery without angina pectoris: Secondary | ICD-10-CM

## 2024-03-04 MED ORDER — BUDESON-GLYCOPYRROL-FORMOTEROL 160-9-4.8 MCG/ACT IN AERO
2.0000 | INHALATION_SPRAY | Freq: Two times a day (BID) | RESPIRATORY_TRACT | Status: DC
  Administered 2024-03-04 – 2024-03-08 (×8): 2 via RESPIRATORY_TRACT
  Filled 2024-03-04: qty 5.9

## 2024-03-04 MED ORDER — IPRATROPIUM-ALBUTEROL 0.5-2.5 (3) MG/3ML IN SOLN
3.0000 mL | RESPIRATORY_TRACT | Status: DC | PRN
  Administered 2024-03-04: 3 mL via RESPIRATORY_TRACT
  Filled 2024-03-04: qty 3

## 2024-03-04 MED ORDER — IRBESARTAN 75 MG PO TABS
37.5000 mg | ORAL_TABLET | Freq: Every day | ORAL | Status: DC
  Administered 2024-03-05 – 2024-03-06 (×2): 37.5 mg via ORAL
  Filled 2024-03-04 (×2): qty 0.5

## 2024-03-04 MED ORDER — METOPROLOL SUCCINATE ER 25 MG PO TB24
25.0000 mg | ORAL_TABLET | Freq: Two times a day (BID) | ORAL | Status: DC
  Administered 2024-03-04 – 2024-03-08 (×8): 25 mg via ORAL
  Filled 2024-03-04 (×9): qty 1

## 2024-03-04 MED ORDER — HYDRALAZINE HCL 20 MG/ML IJ SOLN
5.0000 mg | INTRAMUSCULAR | Status: DC | PRN
  Administered 2024-03-05: 5 mg via INTRAVENOUS
  Filled 2024-03-04 (×2): qty 1

## 2024-03-04 MED ORDER — HYDRALAZINE HCL 50 MG PO TABS
50.0000 mg | ORAL_TABLET | Freq: Four times a day (QID) | ORAL | Status: DC | PRN
  Administered 2024-03-04: 50 mg via ORAL
  Filled 2024-03-04: qty 1

## 2024-03-04 MED ORDER — PREDNISONE 20 MG PO TABS
40.0000 mg | ORAL_TABLET | Freq: Every day | ORAL | Status: DC
  Administered 2024-03-04 – 2024-03-05 (×2): 40 mg via ORAL
  Filled 2024-03-04 (×2): qty 2

## 2024-03-04 MED ORDER — LATANOPROST 0.005 % OP SOLN
1.0000 [drp] | Freq: Every day | OPHTHALMIC | Status: DC
  Administered 2024-03-04 – 2024-03-07 (×4): 1 [drp] via OPHTHALMIC
  Filled 2024-03-04 (×3): qty 2.5

## 2024-03-04 MED ORDER — RANOLAZINE ER 500 MG PO TB12
500.0000 mg | ORAL_TABLET | Freq: Two times a day (BID) | ORAL | Status: DC
  Administered 2024-03-04 – 2024-03-05 (×3): 500 mg via ORAL
  Filled 2024-03-04 (×3): qty 1

## 2024-03-04 MED ORDER — ALBUTEROL SULFATE (2.5 MG/3ML) 0.083% IN NEBU: 2.5000 mg | INHALATION_SOLUTION | RESPIRATORY_TRACT | Status: DC | PRN

## 2024-03-04 MED ORDER — ONDANSETRON HCL 4 MG/2ML IJ SOLN: 4.0000 mg | Freq: Four times a day (QID) | INTRAMUSCULAR | Status: DC | PRN

## 2024-03-04 MED ORDER — ONDANSETRON HCL 4 MG PO TABS: 4.0000 mg | ORAL_TABLET | Freq: Four times a day (QID) | ORAL | Status: DC | PRN

## 2024-03-04 MED ORDER — ATORVASTATIN CALCIUM 40 MG PO TABS
40.0000 mg | ORAL_TABLET | Freq: Every day | ORAL | Status: DC
  Administered 2024-03-04 – 2024-03-08 (×5): 40 mg via ORAL
  Filled 2024-03-04 (×5): qty 1

## 2024-03-04 MED ORDER — TAMSULOSIN HCL 0.4 MG PO CAPS
0.4000 mg | ORAL_CAPSULE | Freq: Every day | ORAL | Status: DC
  Administered 2024-03-04 – 2024-03-08 (×5): 0.4 mg via ORAL
  Filled 2024-03-04 (×5): qty 1

## 2024-03-04 MED ORDER — MUSCLE RUB 10-15 % EX CREA
TOPICAL_CREAM | Freq: Two times a day (BID) | CUTANEOUS | Status: DC | PRN
  Filled 2024-03-04: qty 85

## 2024-03-04 MED ORDER — APIXABAN 5 MG PO TABS
5.0000 mg | ORAL_TABLET | Freq: Two times a day (BID) | ORAL | Status: DC
  Administered 2024-03-04 – 2024-03-08 (×9): 5 mg via ORAL
  Filled 2024-03-04 (×9): qty 1

## 2024-03-04 MED ORDER — ACETAMINOPHEN 325 MG PO TABS
650.0000 mg | ORAL_TABLET | Freq: Four times a day (QID) | ORAL | Status: DC | PRN
  Administered 2024-03-04: 650 mg via ORAL
  Filled 2024-03-04: qty 2

## 2024-03-04 MED ORDER — FINASTERIDE 5 MG PO TABS
5.0000 mg | ORAL_TABLET | Freq: Every day | ORAL | Status: DC
  Administered 2024-03-04 – 2024-03-08 (×5): 5 mg via ORAL
  Filled 2024-03-04 (×5): qty 1

## 2024-03-04 MED ORDER — TIMOLOL MALEATE 0.5 % OP SOLN
1.0000 [drp] | Freq: Two times a day (BID) | OPHTHALMIC | Status: DC
  Administered 2024-03-04 – 2024-03-08 (×9): 1 [drp] via OPHTHALMIC
  Filled 2024-03-04: qty 5

## 2024-03-04 NOTE — Assessment & Plan Note (Signed)
 Rate controlled at present  Continue home eliquis  and metoprolol 

## 2024-03-04 NOTE — Progress Notes (Signed)
   03/04/24 1500  TOC Brief Assessment  Insurance and Status Reviewed  Patient has primary care physician Yes  Home environment has been reviewed From home  Prior level of function: Independent  Prior/Current Home Services No current home services  Social Drivers of Health Review SDOH reviewed no interventions necessary  Readmission risk has been reviewed Yes  Transition of care needs no transition of care needs at this time   From home c/supportive family. DME at home: cane, nebulizer  Transition of Care Department [facility]) has reviewed patient and no other TOC needs have been identified at this time. We will continue to monitor patient advancement through interdisciplinary progression rounds. If new patient needs arise, please place a TOC consult.

## 2024-03-04 NOTE — H&P (Addendum)
 History and Physical    Patient: Bryan Sullivan FMW:[id number] DOB: 04-06-1946 DOA: 03/03/2024 DOS: the patient was seen and examined on 03/04/2024 PCP: Joshua Francisco, MD  Patient coming from: Home  Chief Complaint:  Chief Complaint  Patient presents with   Chest Pain   Shortness of Breath   HPI: Norm Bearett Porcaro is a 78 y.o. male with medical history significant of paroxysmal atrial fibrillation (on Eliquis ), prostate cancer s/p radiation therapy, mild coronary artery disease (seen on coronary CTA),  COPD, hypertension, hyperlipidemia, gastroesophageal reflux disease, glaucoma presenting with shortness of.  Patient and wife who primarily Arabic speaking.  Patient with increased work of breathing over the past 4 to 5 days.  Positive cough and wheezing.  Mild increased work of breathing especially with exertion.?  Trace lower extremity swelling.  Non-smoker.  Has been using home inhalers with minimal to mild improvement in symptomsedema bilaterally.  Creatinine 0.3.  Family reports and attributes symptoms secondary to her weather change. States the cold weather days worsened work of breathing.  No recent changes in medication. No chest pain. No abdominal pain. No orthopnea or PND.  Presented to the ER afebrile, hemodynamically stable. Satting well on RA. WBC 4, hgb 9.3, plt 200, Cr 1.16. Trop neg x 2. BNP 194. CXR w/ L sided pleural effusion.  Review of Systems: As mentioned in the history of present illness. All other systems reviewed and are negative. Past Medical History:  Diagnosis Date   Anticoagulant long-term use    eliquis ---  managed by cardiology   BPH (benign prostatic hyperplasia) 2001   CAP (community acquired pneumonia)    admission in epic   04-16-2022 LLL w/ COPD exacerbation;   ED visit 05-20-2022 dx CAP RLL and Flu A   Chronic chest pain    followed by cardiology-- dr fredrica;   per note chronic stable atypical taking imdur  and ranaxa daily   CKD  (chronic kidney disease), stage II    COPD with asthma (HCC)    Coronary artery disease    cardiologist--- dr g. fredrica;   mild CAD nonobstructive -- coronary CT 10/ 2023 in care everywhere   First degree heart block    Full dentures    Hypertension    Dx at age 45   Lumbar radiculopathy    Malignant neoplasm prostate [facility]) 02/2022   urologist--  dr winter/  radiation onologist---  dr patrcia,   dx 10/ 2023  Gleason 4+4   Neurotrophic keratopathy of right eye    OA (osteoarthritis)    Open angle with borderline findings and high glaucoma risk in left eye    PAF (paroxysmal atrial fibrillation) (HCC) 03/2022   cardiologist--- dr fredrica   Primary open angle glaucoma of right eye, severe stage    Wears glasses    Past Surgical History:  Procedure Laterality Date   CATARACT EXTRACTION W/ INTRAOCULAR LENS  IMPLANT, BILATERAL Bilateral    right 2001;  left 2003   CORNEAL TRANSPLANT Right    02/ 2011 and   11-04-2012    both @ [facility]   GOLD SEED IMPLANT N/A 06/21/2022   Procedure: GOLD SEED IMPLANT;  Surgeon: Renda Glance, MD;  Location: [facility];  Service: Urology;  Laterality: N/A;   SPACE OAR INSTILLATION N/A 06/21/2022   Procedure: SPACE OAR INSTILLATION;  Surgeon: Renda Glance, MD;  Location: [facility];  Service: Urology;  Laterality: N/A;   Social History:  reports that he quit smoking about 55  years ago. His smoking use included cigarettes. He started smoking about 65 years ago. He has never used smokeless tobacco. He reports that he does not drink alcohol  and does not use drugs.  No Known Allergies  Family History  Problem Relation Age of Onset   Diabetes Mother    Asthma Father    Hypertension Father    Asthma Sister     Prior to Admission medications   Medication Sig Start Date End Date Taking? Authorizing Provider  albuterol  (PROVENTIL ) (2.5 MG/3ML) 0.083% nebulizer solution Inhale 3 mLs into the lungs every 4 (four) hours  as needed. 09/12/23  Yes   albuterol  (VENTOLIN  HFA) 108 (90 Base) MCG/ACT inhaler Inhale 2 puffs into the lungs every 4 (four) hours as needed. 09/10/23  Yes   apixaban  (ELIQUIS ) 5 MG TABS tablet Take 1 tablet (5 mg total) by mouth 2 (two) times daily. 05/13/23  Yes   atorvastatin  (LIPITOR) 40 MG tablet Take 1 tablet (40 mg total) by mouth daily. 09/10/23  Yes   atropine  1 % ophthalmic solution Place 1 drop into the right eye daily as needed 01/14/24  Yes   budesonide -glycopyrrolate -formoterol  (BREZTRI  AEROSPHERE) 160-9-4.8 MCG/ACT AERO inhaler Inhale 2 puffs into the lungs 2 (two) times daily. 02/19/23  Yes   dorzolamide  (TRUSOPT ) 2 % ophthalmic solution Place 1 drop into the right eye 3 (three) times daily. 05/01/23  Yes   erythromycin  ophthalmic ointment Place 1 Application into affected eyes 2 (two) times daily as directed. 01/14/24  Yes   finasteride  (PROSCAR ) 5 MG tablet Take 1 tablet (5 mg total) by mouth daily. 09/10/23  Yes   latanoprost  (XALATAN ) 0.005 % ophthalmic solution Place 1 drop into both eyes at bedtime. 06/03/23  Yes   metoprolol  succinate (TOPROL -XL) 25 MG 24 hr tablet Take one tablet (25 mg dose) by mouth 2 (two) times daily. Please, hold Metoprolol  if systolic BP is less than 105 or HR is less than 60. 06/11/23  Yes   Polyethyl Glycol-Propyl Glycol (SYSTANE) 0.4-0.3 % GEL ophthalmic gel Place 1 Application into both eyes as needed.   Yes [provider]  ranolazine  (RANEXA ) 500 MG 12 hr tablet Take 1 tablet (500 mg total) by mouth 2 (two) times daily. 06/11/23  Yes   tamsulosin  (FLOMAX ) 0.4 MG CAPS capsule Take 1 capsule (0.4 mg total) by mouth daily. 09/10/23  Yes   timolol  (TIMOPTIC ) 0.5 % ophthalmic solution Place 1 drop into both eyes 2 (two) times daily. 05/01/23  Yes   valsartan  (DIOVAN ) 40 MG tablet Take 0.5 tablets (20 mg total) by mouth daily. Please hold Valsartan  if systolic blood pressure less than 115. 06/11/23  Yes   atorvastatin  (LIPITOR) 40 MG tablet Take 1 tablet (40  mg total) by mouth daily. Patient not taking: Reported on 03/04/2024 06/11/23     isosorbide  mononitrate (IMDUR ) 30 MG 24 hr tablet Take 1 tablet (30 mg total) by mouth daily. 11/19/22 12/24/22      Physical Exam: Vitals:   03/04/24 0823 03/04/24 0930 03/04/24 1013 03/04/24 1214  BP:  (!) 158/76 (!) 158/79   Pulse:  69 68   Resp:  20    Temp: 98 F (36.7 C)   97.9 F (36.6 C)  TempSrc: Oral   Oral  SpO2:  100%    Height:       Physical Exam Constitutional:      Appearance: He is normal weight.  HENT:     Head: Normocephalic and atraumatic.  Nose: Nose normal.     Mouth/Throat:     Mouth: Mucous membranes are moist.  Eyes:     Pupils: Pupils are equal, round, and reactive to light.  Cardiovascular:     Rate and Rhythm: Normal rate and regular rhythm.  Pulmonary:     Effort: Pulmonary effort is normal.  Abdominal:     General: Bowel sounds are normal.  Musculoskeletal:        General: Normal range of motion.  Skin:    General: Skin is warm.  Neurological:     General: No focal deficit present.     Data Reviewed:  There are no new results to review at this time.  DG Chest 2 View CLINICAL DATA:  sob  EXAM: CHEST - 2 VIEW  COMPARISON:  Chest x-ray 08/21/2023  FINDINGS: The heart and mediastinal contours are within normal limits.  No focal consolidation. Chronic coarsened interstitial markings with no overt pulmonary edema. Small left pleural effusion. Trace right pleural effusion. No pneumothorax.  No acute osseous abnormality.  IMPRESSION: Small left pleural effusion. Trace right pleural effusion.  Electronically Signed   By: Morgane  Naveau M.D.   On: 03/03/2024 18:56  Lab Results  Component Value Date   WBC 4.0 03/03/2024   HGB 9.3 (L) 03/03/2024   HCT 30.4 (L) 03/03/2024   MCV 89.4 03/03/2024   PLT 200 03/03/2024   Last metabolic panel Lab Results  Component Value Date   GLUCOSE 109 (H) 03/03/2024   NA 137 03/03/2024   K 4.0 03/03/2024    CL 104 03/03/2024   CO2 25 03/03/2024   BUN 17 03/03/2024   CREATININE 1.16 03/03/2024   GFRNONAA >60 03/03/2024   CALCIUM  8.5 (L) 03/03/2024   PHOS 3.3 06/23/2015   PROT 6.3 (L) 08/21/2023   ALBUMIN 3.3 (L) 08/21/2023   LABGLOB 2.7 10/27/2020   AGRATIO 1.6 10/27/2020   BILITOT 0.9 08/21/2023   ALKPHOS 53 08/21/2023   AST 13 (L) 08/21/2023   ALT 11 08/21/2023   ANIONGAP 8 03/03/2024    Assessment and Plan: Acute exacerbation of COPD with asthma (HCC) Baseline history of COPD and asthma with noted worsening increased work of breathing over the past 4 to 5 days. Symptoms persistent despite home inhaler use Chest x-ray grossly stable apart from trace left-sided pleural effusion Status post IV steroids in the ER Still with mild SOB Continue prednisone . No hypoxia at present which is reassuring. Supplemental O2 prn    Elevated brain natriuretic peptide (BNP) level BNP 194 presentation which is upper limits of prior values Noted trace pleural effusion on chest x-ray Minimal lower extremity swelling Given shortness of breath on presentation, will check 2D echo to correlate IV Lasix  as clinically indicated Consult cardiology as appropriate Follow-up  Paroxysmal atrial fibrillation (HCC) Rate controlled at present  Continue home eliquis  and metoprolol    Essential hypertension BP stable  Titrate home regimen    Mixed hyperlipidemia Cont lipitor    Glaucoma (increased eye pressure) Cont eye drops    BPH (benign prostatic hyperplasia) Stable  Cont home finasteride  and flomax         Advance Care Planning:   Code Status: Full Code   Consults: None   Family Communication: Wife at the bedside   Severity of Illness: The appropriate patient status for this patient is OBSERVATION. Observation status is judged to be reasonable and necessary in order to provide the required intensity of service to ensure the patient's safety. The patient's presenting symptoms,  physical exam findings, and initial radiographic and laboratory data in the context of their medical condition is felt to place them at decreased risk for further clinical deterioration. Furthermore, it is anticipated that the patient will be medically stable for discharge from the hospital within 2 midnights of admission.   Author: Elspeth JINNY Masters, MD 03/04/2024 1:17 PM  For on call review [email or web address].

## 2024-03-04 NOTE — Care Management Obs Status (Signed)
 MEDICARE OBSERVATION STATUS NOTIFICATION   Patient Details  Name: Bryan Sullivan MRN: [id number] Date of Birth: 1946-02-22   Medicare Observation Status Notification Given:  Yes    Nena LITTIE Coffee, RN 03/04/2024, 2:41 PM

## 2024-03-04 NOTE — Care Management CC44 (Signed)
 Condition Code 44 Documentation Completed  Patient Details  Name: Chukwudi Ewen MRN: [id number] Date of Birth: 12-Jan-1946   Condition Code 44 given:  Yes Patient signature on Condition Code 44 notice:  Yes Documentation of 2 MD's agreement:  Yes Code 44 added to claim:  Yes    Nena LITTIE Coffee, RN 03/04/2024, 2:41 PM

## 2024-03-04 NOTE — Assessment & Plan Note (Signed)
 BP stable Titrate home regimen

## 2024-03-04 NOTE — Assessment & Plan Note (Signed)
 Cont lipitor

## 2024-03-04 NOTE — Assessment & Plan Note (Signed)
Cont eye drops

## 2024-03-04 NOTE — ED Notes (Signed)
 Nurse went to administer 10am medications and family informed this nurse that they have been giving the pt his home meds. Family was informed to stop and that we were administering his medications.

## 2024-03-04 NOTE — Assessment & Plan Note (Signed)
 BNP 194 presentation which is upper limits of prior values Noted trace pleural effusion on chest x-ray Minimal lower extremity swelling Given shortness of breath on presentation, will check 2D echo to correlate IV Lasix  as clinically indicated Consult cardiology as appropriate Follow-up

## 2024-03-04 NOTE — Assessment & Plan Note (Signed)
 Baseline history of COPD and asthma with noted worsening increased work of breathing over the past 4 to 5 days. Symptoms persistent despite home inhaler use Chest x-ray grossly stable apart from trace left-sided pleural effusion Status post IV steroids in the ER Still with mild SOB Continue prednisone . No hypoxia at present which is reassuring. Supplemental O2 prn

## 2024-03-04 NOTE — Assessment & Plan Note (Signed)
 Stable  Cont home finasteride  and flomax 

## 2024-03-05 ENCOUNTER — Observation Stay (HOSPITAL_COMMUNITY)

## 2024-03-05 DIAGNOSIS — Z961 Presence of intraocular lens: Secondary | ICD-10-CM | POA: Diagnosis present

## 2024-03-05 DIAGNOSIS — Z8546 Personal history of malignant neoplasm of prostate: Secondary | ICD-10-CM | POA: Diagnosis not present

## 2024-03-05 DIAGNOSIS — T502X5A Adverse effect of carbonic-anhydrase inhibitors, benzothiadiazides and other diuretics, initial encounter: Secondary | ICD-10-CM | POA: Diagnosis not present

## 2024-03-05 DIAGNOSIS — I5033 Acute on chronic diastolic (congestive) heart failure: Secondary | ICD-10-CM | POA: Diagnosis present

## 2024-03-05 DIAGNOSIS — Z87891 Personal history of nicotine dependence: Secondary | ICD-10-CM | POA: Diagnosis not present

## 2024-03-05 DIAGNOSIS — N400 Enlarged prostate without lower urinary tract symptoms: Secondary | ICD-10-CM | POA: Diagnosis present

## 2024-03-05 DIAGNOSIS — Z947 Corneal transplant status: Secondary | ICD-10-CM | POA: Diagnosis not present

## 2024-03-05 DIAGNOSIS — I130 Hypertensive heart and chronic kidney disease with heart failure and stage 1 through stage 4 chronic kidney disease, or unspecified chronic kidney disease: Secondary | ICD-10-CM | POA: Diagnosis present

## 2024-03-05 DIAGNOSIS — Z79899 Other long term (current) drug therapy: Secondary | ICD-10-CM | POA: Diagnosis not present

## 2024-03-05 DIAGNOSIS — Z833 Family history of diabetes mellitus: Secondary | ICD-10-CM | POA: Diagnosis not present

## 2024-03-05 DIAGNOSIS — J441 Chronic obstructive pulmonary disease with (acute) exacerbation: Secondary | ICD-10-CM | POA: Diagnosis present

## 2024-03-05 DIAGNOSIS — Z9842 Cataract extraction status, left eye: Secondary | ICD-10-CM | POA: Diagnosis not present

## 2024-03-05 DIAGNOSIS — N170 Acute kidney failure with tubular necrosis: Secondary | ICD-10-CM | POA: Diagnosis not present

## 2024-03-05 DIAGNOSIS — Y92239 Unspecified place in hospital as the place of occurrence of the external cause: Secondary | ICD-10-CM | POA: Diagnosis not present

## 2024-03-05 DIAGNOSIS — R0609 Other forms of dyspnea: Secondary | ICD-10-CM

## 2024-03-05 DIAGNOSIS — Z8616 Personal history of COVID-19: Secondary | ICD-10-CM | POA: Diagnosis not present

## 2024-03-05 DIAGNOSIS — J45901 Unspecified asthma with (acute) exacerbation: Secondary | ICD-10-CM | POA: Diagnosis present

## 2024-03-05 DIAGNOSIS — I160 Hypertensive urgency: Secondary | ICD-10-CM | POA: Diagnosis present

## 2024-03-05 DIAGNOSIS — R0602 Shortness of breath: Secondary | ICD-10-CM | POA: Diagnosis present

## 2024-03-05 DIAGNOSIS — I4821 Permanent atrial fibrillation: Secondary | ICD-10-CM | POA: Diagnosis present

## 2024-03-05 DIAGNOSIS — Z825 Family history of asthma and other chronic lower respiratory diseases: Secondary | ICD-10-CM | POA: Diagnosis not present

## 2024-03-05 DIAGNOSIS — Z8249 Family history of ischemic heart disease and other diseases of the circulatory system: Secondary | ICD-10-CM | POA: Diagnosis not present

## 2024-03-05 DIAGNOSIS — Z923 Personal history of irradiation: Secondary | ICD-10-CM | POA: Diagnosis not present

## 2024-03-05 DIAGNOSIS — Z9841 Cataract extraction status, right eye: Secondary | ICD-10-CM | POA: Diagnosis not present

## 2024-03-05 DIAGNOSIS — I2510 Atherosclerotic heart disease of native coronary artery without angina pectoris: Secondary | ICD-10-CM | POA: Diagnosis present

## 2024-03-05 DIAGNOSIS — Z7901 Long term (current) use of anticoagulants: Secondary | ICD-10-CM | POA: Diagnosis not present

## 2024-03-05 DIAGNOSIS — N182 Chronic kidney disease, stage 2 (mild): Secondary | ICD-10-CM | POA: Diagnosis present

## 2024-03-05 LAB — COMPREHENSIVE METABOLIC PANEL WITH GFR
ALT: 11 U/L (ref 0–44)
AST: 13 U/L — ABNORMAL LOW (ref 15–41)
Albumin: 3 g/dL — ABNORMAL LOW (ref 3.5–5.0)
Alkaline Phosphatase: 52 U/L (ref 38–126)
Anion gap: 9 (ref 5–15)
BUN: 25 mg/dL — ABNORMAL HIGH (ref 8–23)
CO2: 25 mmol/L (ref 22–32)
Calcium: 8.6 mg/dL — ABNORMAL LOW (ref 8.9–10.3)
Chloride: 101 mmol/L (ref 98–111)
Creatinine, Ser: 1.19 mg/dL (ref 0.61–1.24)
GFR, Estimated: >60 mL/min (ref >60)
Glucose, Bld: 144 mg/dL — ABNORMAL HIGH (ref 70–99)
Potassium: 4.1 mmol/L (ref 3.5–5.1)
Sodium: 135 mmol/L (ref 135–145)
Total Bilirubin: 0.6 mg/dL (ref 0.0–1.2)
Total Protein: 5.8 g/dL — ABNORMAL LOW (ref 6.5–8.1)

## 2024-03-05 LAB — CBC
HCT: 29.6 % — ABNORMAL LOW (ref 39.0–52.0)
Hemoglobin: 9.5 g/dL — ABNORMAL LOW (ref 13.0–17.0)
MCH: 27.4 pg (ref 26.0–34.0)
MCHC: 32.1 g/dL (ref 30.0–36.0)
MCV: 85.3 fL (ref 80.0–100.0)
Platelets: 210 K/uL (ref 150–400)
RBC: 3.47 MIL/uL — ABNORMAL LOW (ref 4.22–5.81)
RDW: 13.4 % (ref 11.5–15.5)
WBC: 13.3 K/uL — ABNORMAL HIGH (ref 4.0–10.5)
nRBC: 0 % (ref 0.0–0.2)

## 2024-03-05 LAB — ECHOCARDIOGRAM COMPLETE
AR max vel: 2.98 cm2
AV Area VTI: 2.86 cm2
AV Area mean vel: 2.81 cm2
AV Mean grad: 5 mmHg
AV Peak grad: 10.6 mmHg
Ao pk vel: 1.63 m/s
Area-P 1/2: 4.71 cm2
Calc EF: 55.5 %
Height: 66 in
MV M vel: 4.91 m/s
MV Peak grad: 96.2 mmHg
MV VTI: 2.46 cm2
S' Lateral: 3.3 cm
Single Plane A2C EF: 57.7 %
Single Plane A4C EF: 55.9 %

## 2024-03-05 MED ORDER — FUROSEMIDE 10 MG/ML IJ SOLN
40.0000 mg | Freq: Every day | INTRAMUSCULAR | Status: DC
  Administered 2024-03-05 – 2024-03-07 (×3): 40 mg via INTRAVENOUS
  Filled 2024-03-05 (×3): qty 4

## 2024-03-05 MED ORDER — ATROPINE SULFATE 1 % OP SOLN
1.0000 [drp] | Freq: Every day | OPHTHALMIC | Status: DC
  Administered 2024-03-05 – 2024-03-08 (×4): 1 [drp] via OPHTHALMIC
  Filled 2024-03-05: qty 2

## 2024-03-05 MED ORDER — POLYETHYL GLYCOL-PROPYL GLYCOL 0.4-0.3 % OP GEL: 1.0000 | Freq: Once | OPHTHALMIC | Status: DC

## 2024-03-05 MED ORDER — ERYTHROMYCIN 5 MG/GM OP OINT
1.0000 | TOPICAL_OINTMENT | Freq: Two times a day (BID) | OPHTHALMIC | Status: DC
Start: 2024-03-05 — End: 2024-03-08
  Administered 2024-03-05 – 2024-03-08 (×7): 1 via OPHTHALMIC
  Filled 2024-03-05 (×2): qty 1

## 2024-03-05 MED ORDER — ALBUTEROL SULFATE (2.5 MG/3ML) 0.083% IN NEBU
2.5000 mg | INHALATION_SOLUTION | RESPIRATORY_TRACT | Status: DC | PRN
  Administered 2024-03-05 – 2024-03-07 (×4): 2.5 mg via RESPIRATORY_TRACT
  Filled 2024-03-05 (×4): qty 3

## 2024-03-05 MED ORDER — DORZOLAMIDE HCL 2 % OP SOLN
1.0000 [drp] | Freq: Three times a day (TID) | OPHTHALMIC | Status: DC
  Administered 2024-03-05 – 2024-03-08 (×9): 1 [drp] via OPHTHALMIC
  Filled 2024-03-05: qty 10

## 2024-03-05 MED ORDER — POLYVINYL ALCOHOL 1.4 % OP SOLN
1.0000 [drp] | Freq: Once | OPHTHALMIC | Status: AC
  Administered 2024-03-05: 1 [drp] via OPHTHALMIC
  Filled 2024-03-05: qty 15

## 2024-03-05 NOTE — Plan of Care (Signed)

## 2024-03-05 NOTE — Progress Notes (Signed)
 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 Louetta previously has been offered A-fib ablation previously has been on amiodarone for rhythm strategy per last office visit 08/2022 Currently also has previous wide-complex tachycardia based on Holter Prostate cancer status post XRT Dr. Patrcia with gold seed implantation January 2024 Mild CAD (coronary calcium  score 57) COPD HTN HLD reflux Previous COVID 2020 Nodularity 7 mm on CT chest 2019 Last echo 2016 showed grade 1 diastolic dysfunction EF 50-55% 10/7 present [facility] ED shortness of breath asthma exacerbation?  With SOB over 4 to 5 days wheeze cough minimal improvement BNP noted 193 CXR small left pleural effusion EKG no ST-T wave changes by STs criteria for LVH     Assessment  & Plan :    Undifferentiated SOB HFpEF decompensated?-On exam mild JVD trace peripheral edema but does sleep with several pillows I think he has slightly decompensated HFpEF Update echo Lasix  40 daily IV has been ordered and we will get a desat screen and ensure that he is stable Underlying asthma Retrospectively probably had an asthma exacerbation that worsened his underlying pulmonary/cardiac condition I do not hear any wheeze today I would discontinue prednisone  He can continue Breztri  2 puffs twice daily DuoNeb 3 mL every 4 as needed for now Paroxysmal A-fib--in the past offered ablation previously on amiodarone-currently HTN Currently maintained on metoprolol  25 twice daily which I would continue Can use a VaPro 37.5 cautiously in the setting of his age Lipidemia Coronary artery score around 49 recently Continue statin-he does not take Ranexa  he says  have discontinued off of his [facility] Chest nodule requiring outpatient follow-up Never smoker-needs outpatient follow-up with Dr. Manning/oncologist May require 2-month scan BPH prostate cancer status post XRT follows Dr. Patrcia Outpatient management apparently has gold seed implantation Needs outpatient PSA    Good discussion with family members who elected not to use Arabic interpreter-son on phone tells me that they elected not to do A-fib ablation in the past-at baseline he is quite functional and up till last week he was walking about 45 minutes We will do a desat screen and if he is stable echo comes back as normal and he is not too short of breath we can probably discharge him in 24 hours    Data Reviewed:   Sodium 135 potassium 4.1 BUN/creatinine 25/1.1 WBC 13 hemoglobin 9.5 platelet 210 LFTs normal   DVT prophylaxis: Eliquis   Status is: Observation The patient remains OBS appropriate and will d/c before 2 midnights.     Current Dispo: Inpatient until resolution     Subjective:    Awake pleasant no distress EOMI NCAT No work of breathing no oxygen  requirement no cough no sputum Overall looks well   Objective + exam Vitals:   03/05/24 0000 03/05/24 0002 03/05/24 0400 03/05/24 0700  BP: (!) 143/69  (!) 158/83 (!) 188/93  Pulse:  68 66   Resp: 17  17   Temp: 98.4 F (36.9 C)  98.5 F (36.9 C) 98.4 F (36.9 C)  TempSrc: Oral  Oral Oral  SpO2: 98% 99% 98%   Height:       There were no vitals filed for  this visit.   Examination: EOMI NCAT no focal deficit no icterus no pallor postop changes to right eye he has poor visual acuity in that At 30 degrees he has JVD Hepatojugular reflex positive S1-S2 no murmur seems to be in sinus on monitors personally reviewed he has PVCs only Abdomen is soft no rebound no guarding power is 5/5 Sensory is intact  Scheduled Meds:  apixaban   5 mg Oral BID   atorvastatin   40 mg Oral Daily    budesonide -glycopyrrolate -formoterol   2 puff Inhalation BID   finasteride   5 mg Oral Daily   irbesartan  37.5 mg Oral Daily   latanoprost   1 drop Both Eyes QHS   metoprolol  succinate  25 mg Oral BID   predniSONE   40 mg Oral Q breakfast   ranolazine   500 mg Oral BID   tamsulosin   0.4 mg Oral Daily   timolol   1 drop Both Eyes BID   Continuous Infusions:  Time 60  Jai-Gurmukh Emelie Newsom, MD  [facility]

## 2024-03-05 NOTE — Evaluation (Signed)
 Occupational Therapy Evaluation Patient Details Name: Bryan Sullivan MRN: [id number] DOB: 10-17-1945 Today's Date: 03/05/2024   History of Present Illness   Pt is a 78 yr old male who presented 03/03/24 due to SOB and chest pain. Pt admitted due to acute exacerbation of COPD with asthma. PMH: afib, CAD, COPD, HTN, HLD, GERD, glaucoma.     Clinical Impressions Pt at PLOF lives with his son who can assist with taking the pt in/out of the home and completes IADLS. Pt at this time set up for UE and CGA for LE with cues on pacing self. Pt HR 60s-92 with ambulation with CGA and use of SPC.  BP pre 160/65 (91) post 180/80 (106) with o2 in 90s%.  At this time recommendation for OP PT and Acute Occupational Therapy to follow.      If plan is discharge home, recommend the following:   Help with stairs or ramp for entrance;Assistance with cooking/housework     Functional Status Assessment   Patient has had a recent decline in their functional status and demonstrates the ability to make significant improvements in function in a reasonable and predictable amount of time.     Equipment Recommendations   None recommended by OT     Recommendations for Other Services         Precautions/Restrictions   Precautions Precautions: Fall Recall of Precautions/Restrictions: Intact Restrictions Weight Bearing Restrictions Per Provider Order: No     Mobility Bed Mobility Overal bed mobility: Modified Independent             General bed mobility comments: increase in time    Transfers Overall transfer level: Needs assistance Equipment used: Straight cane Transfers: Sit to/from Stand Sit to Stand: Contact guard assist           General transfer comment: cued on pacing self      Balance Overall balance assessment: Needs assistance Sitting-balance support: Feet supported Sitting balance-Leahy Scale: Good     Standing balance support: Single extremity  supported, No upper extremity supported Standing balance-Leahy Scale: Fair Standing balance comment: Pt noted cued occasionally on balance                           ADL either performed or assessed with clinical judgement   ADL Overall ADL's : Needs assistance/impaired Eating/Feeding: Independent;Sitting   Grooming: Wash/dry face;Set up;Sitting   Upper Body Bathing: Set up;Sitting   Lower Body Bathing: Contact guard assist;Sit to/from stand   Upper Body Dressing : Set up;Sitting   Lower Body Dressing: Contact guard assist;Sit to/from stand   Toilet Transfer: Set up   Toileting- Clothing Manipulation and Hygiene: Contact guard assist;Sit to/from stand       Functional mobility during ADLs: Contact guard assist;Cueing for sequencing;Cueing for safety;Cane       Vision Baseline Vision/History: 1 Wears glasses Patient Visual Report:  (pt reported blin in R eye)       Perception         Praxis         Pertinent Vitals/Pain Pain Assessment Pain Assessment: No/denies pain     Extremity/Trunk Assessment Upper Extremity Assessment Upper Extremity Assessment: Generalized weakness   Lower Extremity Assessment Lower Extremity Assessment: Defer to PT evaluation   Cervical / Trunk Assessment Cervical / Trunk Assessment: Kyphotic   Communication Communication Communication: Impaired Factors Affecting Communication:  (Pt's family translated in session)   Cognition Arousal: Alert Behavior During Therapy: [facility] for  tasks assessed/performed Cognition: No apparent impairments                               Following commands: Intact       Cueing  General Comments   Cueing Techniques: Verbal cues      Exercises     Shoulder Instructions      Home Living Family/patient expects to be discharged to:: Private residence Living Arrangements: Children Available Help at Discharge: Family;Available PRN/intermittently Type of Home:  House Home Access: Stairs to enter [employer] of Steps: 1   Home Layout: One level     Bathroom Shower/Tub: Producer, television/film/video: Standard Bathroom Accessibility: Yes How Accessible: Accessible via walker Home Equipment: Cane - single point;Grab bars - tub/shower;Shower seat          Prior Functioning/Environment Prior Level of Function : Independent/Modified Independent             Mobility Comments: uses cane out of the house and reported sometimes in the house ADLs Comments: mod I    OT Problem List: Decreased activity tolerance;Impaired balance (sitting and/or standing);Decreased knowledge of use of DME or AE   OT Treatment/Interventions: Self-care/ADL training;Therapeutic exercise;DME and/or AE instruction;Therapeutic activities;Patient/family education;Balance training      OT Goals(Current goals can be found in the care plan section)   Acute Rehab OT Goals Patient Stated Goal: to go home OT Goal Formulation: With patient Time For Goal Achievement: 03/19/24 Potential to Achieve Goals: Fair   OT Frequency:  Min 2X/week    Co-evaluation              AM-PAC OT 6 Clicks Daily Activity     Outcome Measure Help from another person eating meals?: None Help from another person taking care of personal grooming?: None Help from another person toileting, which includes using toliet, bedpan, or urinal?: A Little Help from another person bathing (including washing, rinsing, drying)?: A Little Help from another person to put on and taking off regular upper body clothing?: None Help from another person to put on and taking off regular lower body clothing?: A Little 6 Click Score: 21   End of Session Equipment Utilized During Treatment: Gait belt Nurse Communication: Mobility status  Activity Tolerance: Patient tolerated treatment well Patient left: in bed (Pt arriving in room to take pt)  OT Visit Diagnosis: Unsteadiness on feet  (R26.81);Other abnormalities of gait and mobility (R26.89);Muscle weakness (generalized) (M62.81)                Time: 8988-8968 OT Time Calculation (min): 20 min Charges:  OT General Charges $OT Visit: 1 Visit OT Evaluation $OT Eval Low Complexity: 1 Low  Warrick POUR OTR/L  Acute Rehab Services  [phone] office number   Warrick Berber 03/05/2024, 11:23 AM

## 2024-03-05 NOTE — Progress Notes (Signed)
 OT Cancellation Note  Patient Details Name: Bryan Sullivan MRN: [id number] DOB: [birth year]   Cancelled Treatment:    Reason Eval/Treat Not Completed: Medical issues which prohibited therapy (spoke to nursing as noted bp 206/102 (132) and requested to hold until check medications for bp. Will follow up.)   Warrick Berber 03/05/2024, 9:41 AM

## 2024-03-05 NOTE — Progress Notes (Signed)
 PT Cancellation Note  Patient Details Name: Laster Appling MRN: [id number] DOB: [birth year]   Cancelled Treatment:    Reason Eval/Treat Not Completed: Medical issues which prohibited therapy (Pt BP: 206/102 while laying supine. RN notified. Will follow up later if time allows.)   Jackalyn Haith 03/05/2024, 9:25 AM

## 2024-03-05 NOTE — Evaluation (Signed)
 Physical Therapy Brief Evaluation and Discharge Note Patient Details Name: Bryan Sullivan MRN: [id number] DOB: Nov 01, 1945 Today's Date: 03/05/2024   History of Present Illness  Pt is a 78 yr old male who presented 03/03/24 due to SOB and chest pain. Pt admitted due to acute exacerbation of COPD with asthma. PMH: afib, CAD, COPD, HTN, HLD, GERD, glaucoma.  Clinical Impression  Pt presents with admitting diagnosis above. Pt today was able to ambulate in the hallway with SPC at supervision level and navigate stairs. PTA pt was Mod I with SPC. Pt presents at or near baseline mobility. Pt has no further acute PT needs and will be signing off. Re consult PT if mobility status changes. Pt would benefit from continued mobility with mobility specialist during acute stay. Pt may benefit from OPPT upon DC for higher level balance training.       PT Assessment All further PT needs can be met in the next venue of care  Assistance Needed at Discharge  PRN    Equipment Recommendations None recommended by PT  Recommendations for Other Services       Precautions/Restrictions Precautions Precautions: Fall Recall of Precautions/Restrictions: Intact Restrictions Weight Bearing Restrictions Per Provider Order: No        Mobility  Bed Mobility       General bed mobility comments: Seated EOB  Transfers Overall transfer level: Needs assistance Equipment used: Straight cane Transfers: Sit to/from Stand Sit to Stand: Supervision           General transfer comment: cued on pacing self    Ambulation/Gait Ambulation/Gait assistance: Supervision Gait Distance (Feet): 250 Feet Assistive device: Straight cane Gait Pattern/deviations: Narrow base of support, Decreased stride length, Step-through pattern Gait Speed: Pace WFL General Gait Details: no LOB noted.  Home Activity Instructions    Stairs Stairs: Yes Stairs assistance: Contact guard assist Stair Management: With  cane, Forwards, Step to pattern Number of Stairs: 2 General stair comments: CGA for safety.  Modified Rankin (Stroke Patients Only)        Balance   Sitting-balance support: Feet supported Sitting balance-Leahy Scale: Good     Standing balance support: Single extremity supported, No upper extremity supported Standing balance-Leahy Scale: Fair Standing balance comment: Pt noted cued occasionally on balance          Pertinent Vitals/Pain PT - Brief Vital Signs All Vital Signs Stable: Yes Pain Assessment Pain Assessment: No/denies pain     Home Living Family/patient expects to be discharged to:: Private residence Living Arrangements: Children Available Help at Discharge: Family;Available 24 hours/day Home Environment: Stairs to enter  [employer] of Steps: 1 Home Equipment: Cane - single point;Grab bars - tub/shower;Shower seat        Prior Function Level of Independence: Independent with assistive device(s)      UE/LE Assessment   UE ROM/Strength/Tone/Coordination: WFL    LE ROM/Strength/Tone/Coordination: [facility]      Communication   Communication Communication: Impaired Factors Affecting Communication:  (Pt's family translated in session)     Cognition Overall Cognitive Status: Appears within functional limits for tasks assessed/performed       General Comments General comments (skin integrity, edema, etc.): BP: 180/80    Exercises     Assessment/Plan    PT Problem List Decreased strength;Decreased range of motion;Decreased activity tolerance;Decreased balance;Decreased mobility;Decreased coordination;Decreased knowledge of use of DME;Decreased safety awareness;Decreased knowledge of precautions;Cardiopulmonary status limiting activity       PT Visit Diagnosis Other abnormalities of gait and mobility (R26.89)  No Skilled PT     Co-evaluation                AMPAC 6 Clicks Help needed turning from your back to your side while in a  flat bed without using bedrails?: None Help needed moving from lying on your back to sitting on the side of a flat bed without using bedrails?: None Help needed moving to and from a bed to a chair (including a wheelchair)?: A Little Help needed standing up from a chair using your arms (e.g., wheelchair or bedside chair)?: A Little Help needed to walk in hospital room?: A Little Help needed climbing 3-5 steps with a railing? : A Little 6 Click Score: 20      End of Session Equipment Utilized During Treatment: Gait belt Activity Tolerance: Patient tolerated treatment well Patient left: in bed;with call bell/phone within reach;with family/visitor present Nurse Communication: Mobility status PT Visit Diagnosis: Other abnormalities of gait and mobility (R26.89)     Time: 8967-8958 PT Time Calculation (min) (ACUTE ONLY): 9 min  Charges:   PT Evaluation $PT Eval Low Complexity: 1 Low      Anora Schwenke B, PT, DPT Acute Rehab Services [phone]   Ruby Logiudice  03/05/2024, 12:21 PM

## 2024-03-06 DIAGNOSIS — J45901 Unspecified asthma with (acute) exacerbation: Secondary | ICD-10-CM | POA: Diagnosis not present

## 2024-03-06 LAB — COMPREHENSIVE METABOLIC PANEL WITH GFR
ALT: 14 U/L (ref 0–44)
AST: 16 U/L (ref 15–41)
Albumin: 3.7 g/dL (ref 3.5–5.0)
Alkaline Phosphatase: 56 U/L (ref 38–126)
Anion gap: 11 (ref 5–15)
BUN: 31 mg/dL — ABNORMAL HIGH (ref 8–23)
CO2: 28 mmol/L (ref 22–32)
Calcium: 8.7 mg/dL — ABNORMAL LOW (ref 8.9–10.3)
Chloride: 99 mmol/L (ref 98–111)
Creatinine, Ser: 1.32 mg/dL — ABNORMAL HIGH (ref 0.61–1.24)
GFR, Estimated: 55 mL/min — ABNORMAL LOW (ref >60)
Glucose, Bld: 109 mg/dL — ABNORMAL HIGH (ref 70–99)
Potassium: 3.3 mmol/L — ABNORMAL LOW (ref 3.5–5.1)
Sodium: 138 mmol/L (ref 135–145)
Total Bilirubin: 0.6 mg/dL (ref 0.0–1.2)
Total Protein: 6.9 g/dL (ref 6.5–8.1)

## 2024-03-06 NOTE — Plan of Care (Signed)

## 2024-03-06 NOTE — Care Management Important Message (Signed)
 Important Message  Patient Details  Name: Curly Mackowski MRN: [id number] Date of Birth: 1945/10/15   Important Message Given:  Yes - Medicare IM     Claretta Deed 03/06/2024, 3:48 PM

## 2024-03-06 NOTE — Progress Notes (Signed)
 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 Louetta previously has been offered A-fib ablation previously has been on amiodarone for rhythm strategy per last office visit 08/2022 Currently also has previous wide-complex tachycardia based on Holter Prostate cancer status post XRT Dr. Patrcia with gold seed implantation January 2024 Mild CAD (coronary calcium  score 57) COPD HTN HLD reflux Previous COVID 2020 Nodularity 7 mm on CT chest 2019 Last echo 2016 showed grade 1 diastolic dysfunction EF 50-55% 10/7 present [facility] ED shortness of breath asthma exacerbation?  With SOB over 4 to 5 days wheeze cough minimal improvement BNP noted 193 CXR small left pleural effusion EKG no ST-T wave changes by STs criteria for LVH 10/10 echocardiogram EF 60-65%?  Elevated PASP indeterminate diastolic parameters    Assessment  & Plan :    Undifferentiated SOB HFpEF decompensated?-On exam mild JVD trace peripheral edema but does sleep with several pillows Echo as above-diuresis slowed to Lasix  IV 40 bid Daily labs still pending Home weight about 79 kg currently 77 standing weight reinforced strict I/O with nursing staff Underlying asthma Retrospectively probably had an asthma exacerbation that worsened his underlying pulmonary/cardiac condition I do not hear any wheeze today I would discontinue prednisone  He can continue Breztri  2 puffs twice daily DuoNeb 3 mL every 4 as needed for now Paroxysmal A-fib--in the past offered ablation previously on amiodarone-currently HTN Currently maintained on metoprolol  25 twice daily which I would continue Hold ARB for now I have asked his family to call his  cardiologist at [facility] and set up an appointment within a week Lipidemia Coronary artery score around 57 recently Continue statin-he does not take Ranexa  he says have discontinued off of his [facility] Chest nodule requiring outpatient follow-up Never smoker-needs outpatient follow-up with Dr. Manning/oncologist May require 80-month scan BPH prostate cancer status post XRT follows Dr. Patrcia Outpatient management apparently has gold seed implantation Needs outpatient PSA    Discussed with patient and daughter at the bedside-they understand   Data Reviewed:   Labs pending  DVT prophylaxis: Eliquis   Status is: Observation The patient remains OBS appropriate and will d/c before 2 midnights.     Current Dispo: Inpatient until resolution     Subjective:    Awake pleasant does not really feel any different from yesterday but is not on oxygen  seems comfortable was able to lay flat a little bit   Objective + exam Vitals:   03/06/24 0443 03/06/24 0926 03/06/24 1155 03/06/24 1302  BP: 137/73 (!) 145/63  (!) 160/71  Pulse:  77  67  Resp: 17 18  18   Temp: 98.4 F (36.9 C) 97.9 F (36.6 C)  98.2 F (36.8 C)  TempSrc: Oral Oral  Oral  SpO2:  100%    Weight:   77.3 kg   Height:       Filed Weights   03/06/24 1155  Weight: 77.3 kg     Examination:  Awake coherent no distress comfortable standing well JVD at 30 degrees seems flatter no hepatojugular reflux No lower extremity edema or ankle edema ROM intact moving 4 limbs  Scheduled Meds:  apixaban   5 mg Oral BID  atorvastatin   40 mg Oral Daily   atropine   1 drop Right Eye Daily   budesonide -glycopyrrolate -formoterol   2 puff Inhalation BID   dorzolamide   1 drop Right Eye TID   erythromycin   1 Application Both Eyes BID   finasteride   5 mg Oral Daily   furosemide   40 mg Intravenous Daily   irbesartan  37.5 mg Oral Daily   latanoprost   1 drop Both Eyes QHS   metoprolol  succinate  25 mg Oral BID   tamsulosin   0.4 mg  Oral Daily   timolol   1 drop Both Eyes BID   Continuous Infusions:  Time 40  Jai-Gurmukh Izzah Pasqua, MD  [facility]

## 2024-03-06 NOTE — Plan of Care (Signed)

## 2024-03-07 DIAGNOSIS — J45901 Unspecified asthma with (acute) exacerbation: Secondary | ICD-10-CM | POA: Diagnosis not present

## 2024-03-07 LAB — BASIC METABOLIC PANEL WITH GFR
Anion gap: 9 (ref 5–15)
BUN: 33 mg/dL — ABNORMAL HIGH (ref 8–23)
CO2: 30 mmol/L (ref 22–32)
Calcium: 8.4 mg/dL — ABNORMAL LOW (ref 8.9–10.3)
Chloride: 97 mmol/L — ABNORMAL LOW (ref 98–111)
Creatinine, Ser: 1.3 mg/dL — ABNORMAL HIGH (ref 0.61–1.24)
GFR, Estimated: 56 mL/min — ABNORMAL LOW (ref >60)
Glucose, Bld: 89 mg/dL (ref 70–99)
Potassium: 3.4 mmol/L — ABNORMAL LOW (ref 3.5–5.1)
Sodium: 136 mmol/L (ref 135–145)

## 2024-03-07 MED ORDER — FUROSEMIDE 40 MG PO TABS
40.0000 mg | ORAL_TABLET | Freq: Every day | ORAL | Status: DC
  Administered 2024-03-08: 40 mg via ORAL
  Filled 2024-03-07: qty 1

## 2024-03-07 NOTE — Progress Notes (Signed)
 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 Louetta previously has been offered A-fib ablation previously has been on amiodarone for rhythm strategy per last office visit 08/2022 Currently also has previous wide-complex tachycardia based on Holter Prostate cancer status post XRT Dr. Patrcia with gold seed implantation January 2024 Mild CAD (coronary calcium  score 57) COPD HTN HLD reflux Previous COVID 2020 Nodularity 7 mm on CT chest 2019 Last echo 2016 showed grade 1 diastolic dysfunction EF 50-55% 10/7 present [facility] ED shortness of breath asthma exacerbation?  With SOB over 4 to 5 days wheeze cough minimal improvement BNP noted 193 CXR small left pleural effusion EKG no ST-T wave changes by STs criteria for LVH 10/10 echocardiogram EF 60-65%?  Elevated PASP indeterminate diastolic parameters    Assessment  & Plan :    Undifferentiated SOB HFpEF decompensated?-On exam mild JVD trace peripheral edema but does sleep with several pillows Echo as above-stop diuretics today-recheck labs a.m. Weight inaccurate-reweigh Enforce 2000 cc fluid restriction Overall looks better ambulatory Underlying asthma Retrospectively probably had an asthma exacerbation that worsened his underlying pulmonary/cardiac condition- Continue Breztri  2 puffs twice daily DuoNeb 3 mL every 4 as needed for now Paroxysmal A-fib--in the past offered ablation previously on amiodarone-currently Iatrogenic ATN Secondary to diuretics etc.-hold fluids hold Lasix  recheck labs tomorrow dose diuretics appropriately based on tomorrow labs HTN Currently maintained on metoprolol  25 twice daily Hold ARB for now I have asked his  family to call his cardiologist at [facility] and set up an appointment within a week Lipidemia Coronary artery score around 57 recently Continue statin-he does not take Ranexa  he says have discontinued off of his [facility] Chest nodule requiring outpatient follow-up Never smoker-needs outpatient follow-up with Dr. Manning/oncologist May require 36-month scan BPH prostate cancer status post XRT follows Dr. Patrcia Outpatient management apparently has gold seed implantation Needs outpatient PSA    Discussed with patient and daughter at the bedside-they understand   Data Reviewed:   Labs pending  DVT prophylaxis: Eliquis   Status is: Observation The patient remains OBS appropriate and will d/c before 2 midnights.     Current Dispo: Inpatient until resolution     Subjective:    Feels a little better was walking in the hallway comfortably felt a little weak but no shortness of breath No chest pain  Objective + exam Vitals:   03/07/24 0500 03/07/24 0841 03/07/24 0843 03/07/24 1113  BP:   (!) 154/76 (!) 142/70  Pulse:    70  Resp:    18  Temp:   98.2 F (36.8 C) 97.6 F (36.4 C)  TempSrc:   Oral Oral  SpO2:  99%    Weight: 84.8 kg     Height:       Filed Weights   03/06/24 1155 03/07/24 0500  Weight: 77.3 kg 84.8 kg     Examination:  Awake coherent no distress comfortable standing well JVD seems flat No lower extremity edema or ankle edema ROM intact moving 4 limbs Power 5/5 neuro intact  Scheduled Meds:  apixaban   5 mg Oral BID   atorvastatin   40 mg Oral Daily   atropine   1 drop Right  Eye Daily   budesonide -glycopyrrolate -formoterol   2 puff Inhalation BID   dorzolamide   1 drop Right Eye TID   erythromycin   1 Application Both Eyes BID   finasteride   5 mg Oral Daily   [START ON 03/08/2024] furosemide   40 mg Oral Daily   latanoprost   1 drop Both Eyes QHS   metoprolol  succinate  25 mg Oral BID   tamsulosin   0.4 mg Oral Daily   timolol   1 drop Both Eyes BID    Continuous Infusions:  Time 20  Bryan Cristin Penaflor, MD  [facility]

## 2024-03-07 NOTE — Progress Notes (Signed)
 Occupational Therapy Treatment Patient Details Name: Bryan Sullivan MRN: [id number] DOB: 14-Oct-1945 Today's Date: 03/07/2024   History of present illness Pt is a 78 yr old male who presented 03/03/24 due to SOB and chest pain. Pt admitted due to acute exacerbation of COPD with asthma. PMH: afib, CAD, COPD, HTN, HLD, GERD, glaucoma.   OT comments  Pt presented with son in the room and reviewed energy conservation. At this time was set up to supervision with ADL and required supervision to CGA with ambulation with SPC. BP at start 139/72 (91) post 149/74 (95) HR 70-80s. At this time Acute Occupational Therapy singing off and to follow up with OP PT at discharge.       If plan is discharge home, recommend the following:  Help with stairs or ramp for entrance;Assistance with cooking/housework   Equipment Recommendations  None recommended by OT    Recommendations for Other Services      Precautions / Restrictions Precautions Precautions: Fall Recall of Precautions/Restrictions: Intact Restrictions Weight Bearing Restrictions Per Provider Order: No       Mobility Bed Mobility               General bed mobility comments: OOB on arrival    Transfers Overall transfer level: Needs assistance Equipment used: Straight cane Transfers: Sit to/from Stand Sit to Stand: Supervision           General transfer comment: pt now was pacing self     Balance Overall balance assessment: Needs assistance Sitting-balance support: Feet supported Sitting balance-Leahy Scale: Good     Standing balance support: Single extremity supported, No upper extremity supported Standing balance-Leahy Scale: Fair Standing balance comment: Pt noted cued occasionally on balance                           ADL either performed or assessed with clinical judgement   ADL Overall ADL's : Needs assistance/impaired Eating/Feeding: Independent;Sitting   Grooming: Wash/dry  hands;Wash/dry face;Oral care;Applying deodorant;Supervision/safety;Sitting   Upper Body Bathing: Set up;Sitting   Lower Body Bathing: Supervison/ safety;Sit to/from stand   Upper Body Dressing : Set up;Sitting   Lower Body Dressing: Supervision/safety;Sit to/from stand   Toilet Transfer: Supervision/safety   Toileting- Architect and Hygiene: Supervision/safety;Sit to/from stand       Functional mobility during ADLs: Contact guard assist;Cane      Extremity/Trunk Assessment Upper Extremity Assessment Upper Extremity Assessment: Overall WFL for tasks assessed   Lower Extremity Assessment Lower Extremity Assessment: Defer to PT evaluation        Vision   Additional Comments: pt blind in R eye   Perception     Praxis     Communication Communication Communication: Impaired Factors Affecting Communication:  (pt did not request translator and son was present in session)   Cognition Arousal: Alert Behavior During Therapy: WFL for tasks assessed/performed Cognition: No apparent impairments                               Following commands: Intact        Cueing   Cueing Techniques: Verbal cues  Exercises      Shoulder Instructions       General Comments BP at start 139/72 (91) post 149/74 (95) HR 70-80s    Pertinent Vitals/ Pain       Pain Assessment Pain Assessment: No/denies pain  Home Living  Prior Functioning/Environment              Frequency  Min 2X/week        Progress Toward Goals  OT Goals(current goals can now be found in the care plan section)  Progress towards OT goals: Progressing toward goals  Acute Rehab OT Goals Patient Stated Goal: none OT Goal Formulation: With patient Time For Goal Achievement: 03/19/24 Potential to Achieve Goals: Fair ADL Goals Pt Will Perform Lower Body Bathing: with modified independence;sit to/from stand Pt Will  Perform Lower Body Dressing: with modified independence;sit to/from stand Pt Will Transfer to Toilet: with modified independence;regular height toilet;ambulating Additional ADL Goal #1: Pt will be able to complete ADLS without cues on pacing.  Plan      Co-evaluation                 AM-PAC OT 6 Clicks Daily Activity     Outcome Measure   Help from another person eating meals?: None Help from another person taking care of personal grooming?: None Help from another person toileting, which includes using toliet, bedpan, or urinal?: None Help from another person bathing (including washing, rinsing, drying)?: None Help from another person to put on and taking off regular upper body clothing?: None Help from another person to put on and taking off regular lower body clothing?: None 6 Click Score: 24    End of Session Equipment Utilized During Treatment: Gait belt  OT Visit Diagnosis: Unsteadiness on feet (R26.81);Other abnormalities of gait and mobility (R26.89);Muscle weakness (generalized) (M62.81)   Activity Tolerance Patient tolerated treatment well   Patient Left in bed;with call bell/phone within reach;with family/visitor present   Nurse Communication Mobility status        Time: 1450-1515 OT Time Calculation (min): 25 min  Charges: OT General Charges $OT Visit: 1 Visit OT Treatments $Self Care/Home Management : 23-37 mins  Warrick POUR OTR/L  Acute Rehab Services  [phone] office number   Warrick Berber 03/07/2024, 3:23 PM

## 2024-03-08 DIAGNOSIS — J45901 Unspecified asthma with (acute) exacerbation: Secondary | ICD-10-CM | POA: Diagnosis not present

## 2024-03-08 LAB — COMPREHENSIVE METABOLIC PANEL WITH GFR
ALT: 12 U/L (ref 0–44)
AST: 11 U/L — ABNORMAL LOW (ref 15–41)
Albumin: 3 g/dL — ABNORMAL LOW (ref 3.5–5.0)
Alkaline Phosphatase: 52 U/L (ref 38–126)
Anion gap: 10 (ref 5–15)
BUN: 25 mg/dL — ABNORMAL HIGH (ref 8–23)
CO2: 31 mmol/L (ref 22–32)
Calcium: 8.5 mg/dL — ABNORMAL LOW (ref 8.9–10.3)
Chloride: 99 mmol/L (ref 98–111)
Creatinine, Ser: 1.16 mg/dL (ref 0.61–1.24)
GFR, Estimated: >60 mL/min (ref >60)
Glucose, Bld: 109 mg/dL — ABNORMAL HIGH (ref 70–99)
Potassium: 3.7 mmol/L (ref 3.5–5.1)
Sodium: 140 mmol/L (ref 135–145)
Total Bilirubin: 0.8 mg/dL (ref 0.0–1.2)
Total Protein: 5.7 g/dL — ABNORMAL LOW (ref 6.5–8.1)

## 2024-03-08 MED ORDER — FUROSEMIDE 40 MG PO TABS
20.0000 mg | ORAL_TABLET | Freq: Every day | ORAL | 1 refills | Status: AC
  Filled 2024-03-08 – 2024-03-09 (×2): qty 15, 30d supply, fill #0
  Filled 2024-04-10: qty 15, 30d supply, fill #1
  Filled 2024-05-11: qty 15, 30d supply, fill #2
  Filled 2024-06-09: qty 15, 30d supply, fill #3

## 2024-03-08 NOTE — Progress Notes (Signed)
 DISCHARGE NOTE HOME Bryan Sullivan to be discharged Home per MD order. Discussed prescriptions and follow up appointments with the patient. Prescriptions given to patient; medication list explained in detail. Patient verbalized understanding.  Skin clean, dry and intact without evidence of skin break down, no evidence of skin tears noted. IV catheter discontinued intact. Site without signs and symptoms of complications. Dressing and pressure applied. Pt denies pain at the site currently. No complaints noted.  Patient free of lines, drains, and wounds.   An After Visit Summary (AVS) was printed and given to the patient. Patient escorted via wheelchair, and discharged home via private auto.  [name], RN

## 2024-03-08 NOTE — Care Management (Signed)
 Attempt to reach patient x 2, unsuccessful. Referral made to [facility] for PT. Added to AVS

## 2024-03-08 NOTE — Discharge Summary (Signed)
 Physician Discharge Summary  Johsua Shevlin FMW:[id number] DOB: 06-Sep-1945 DOA: 03/03/2024  PCP: Joshua Francisco, MD  Admit date: 03/03/2024 Discharge date: 03/08/2024  Time spent: 45 minutes  Recommendations for Outpatient Follow-up:  Requires careful adjustment of diuretics in the setting of heart failure Needs outpatient chest x-ray 1 month Recommend close follow-up Dr. 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 Louetta previously has been offered A-fib ablation previously has been on amiodarone for rhythm strategy per last office visit 08/2022 Currently also has previous wide-complex tachycardia based on Holter Prostate cancer status post XRT Dr. Patrcia with gold seed implantation January 2024 Mild CAD (coronary calcium  score 57) COPD HTN HLD reflux Previous COVID 2020 Nodularity 7 mm on CT chest 2019 Last echo 2016 showed grade 1 diastolic dysfunction EF 50-55% 10/7 present [facility] ED shortness of breath asthma exacerbation?  With SOB over 4 to 5 days wheeze cough minimal improvement BNP noted 193 CXR small left pleural effusion EKG no ST-T wave changes by STs criteria for LVH 10/10 echocardiogram EF 60-65%?  Elevated PASP indeterminate diastolic parameters      Assessment  & Plan :      Undifferentiated SOB HFpEF decompensated?-On exam mild  JVD trace peripheral edema but does sleep with several pillows Initially diuresed relatively aggressively-see below-at discharge will be sending home on Lasix  20 daily with instructions to take an extra dose if gains more than 2 kg of weight in 24 hours or feel short of breath I have explained to him carefully with his family present need for fluid restriction in addition to salt restriction and the need for close follow-up Needs close follow-up with his [facility] cardiologist Dr. Louetta who I will CC on this note His shortness of breath is overall improved Underlying asthma Retrospectively probably had an asthma exacerbation that worsened his underlying pulmonary/cardiac condition- Continue Breztri  2 puffs twice daily DuoNeb 3 mL every 4 as needed for now Paroxysmal A-fib--in the past offered ablation previously on amiodarone-currently Iatrogenic ATN Secondary to diuretics etc.-Lasix  was held on 10/11 Creatinine has come down HTN Currently maintained on metoprolol  25 twice daily Hold ARB for now and in the outpatient may reimplement I have asked his family to call his cardiologist at [facility] and set up an appointment within a week Lipidemia Coronary artery score around 57 recently Continue statin-he does not take Ranexa  he says have discontinued off of his [facility] Chest nodule requiring outpatient follow-up Never smoker-needs outpatient follow-up with Dr. Manning/oncologist May require 78-month scan per them BPH prostate cancer status post XRT follows Dr. Patrcia Outpatient management apparently has gold seed implantation Needs outpatient PSA  Discharge Exam: Vitals:   03/08/24 0727 03/08/24 0842  BP:  (!) 147/73  Pulse:  62  Resp:    Temp:  97.6 F (36.4 C)  SpO2: 99%     Subj on day of d/c   Awake coherent pleasant no distress looks comfortable Was  ambulating the hallways >1000 feet yesterday Does not feel ill No sputum  General Exam on discharge  EOMI NCAT no focal deficit  no icterus no pallor no wheeze no rales no rhonchi ROM intact Abdomen soft no rebound No JVD currently No lower extremity edema Postop changes to right eye Anicteric  Discharge Instructions   Discharge Instructions     (HEART FAILURE PATIENTS) Call MD:  Anytime you have any of the following symptoms: 1) 3 pound weight gain in 24 hours or 5 pounds in 1 week 2) shortness of breath, with or without a dry hacking cough 3) swelling in the hands, feet or stomach 4) if you have to sleep on extra pillows at night in order to breathe.   Complete by: As directed    Diet - low sodium heart healthy   Complete by: As directed    Discharge instructions   Complete by: As directed    Look at your medications carefully I have changed 1 blood pressure medicine and I have put you on a fluid pill-the fluid pill needs to be taken around the same time every day after you take the daily weight with the same amount of clothes that you are wearing on the same scale every day-please see my instructions about taking extra pills As we discussed I did not think that you had an asthma problem I think you had a heart failure problem from all of your underlying heart disease Your [facility] cardiologist should be able to make sure that you have labs in about 3 to 5 days and make sure that you see him within a week latest 10 days to adjust your medications and discuss further options for your heart conditions with you Take care of yourself make sure you do not drink more than 2 L of water in the winter and no more than 2.5 L in the summertime-remember what we discussed about salt and slight restriction of this in terms of diet It was pleasure meeting you take care of yourself   Increase activity slowly   Complete by: As directed       Allergies as of 03/08/2024   No Known Allergies      Medication List     STOP taking these medications    ranolazine  500 MG 12 hr tablet Commonly known as: RANEXA    valsartan  40  MG tablet Commonly known as: DIOVAN        TAKE these medications    albuterol  (2.5 MG/3ML) 0.083% nebulizer solution Commonly known as: PROVENTIL  Inhale 3 mLs into the lungs every 4 (four) hours as needed.   atorvastatin  40 MG tablet Commonly known as: LIPITOR Take 1 tablet (40 mg total) by mouth daily. What changed: Another medication with the same name was removed. Continue taking this medication, and follow the directions you see here.   atropine  1 % ophthalmic solution Place 1 drop into the right eye daily as needed   Breztri  Aerosphere 160-9-4.8 MCG/ACT Aero inhaler Generic drug: budesonide -glycopyrrolate -formoterol  Inhale 2 puffs into the lungs 2 (two) times daily.   dorzolamide  2 % ophthalmic solution Commonly known as: TRUSOPT  Place 1 drop into the right eye 3 (three) times daily.   Eliquis  5 MG Tabs tablet Generic drug: apixaban  Take 1 tablet (5 mg total) by mouth 2 (two) times daily.   erythromycin  ophthalmic ointment Place 1 Application into affected eyes 2 (two) times daily as directed.   finasteride  5 MG tablet Commonly known as: PROSCAR  Take 1 tablet (5  mg total) by mouth daily.   furosemide  40 MG tablet Commonly known as: LASIX  Take 0.5 tablets (20 mg total) by mouth daily. Take this medication daily based on your daily weight being a dry weight of 84 kg-if you gain more than 2 kg in 24-hour period of time take an extra tablet   latanoprost  0.005 % ophthalmic solution Commonly known as: XALATAN  Place 1 drop into both eyes at bedtime.   metoprolol  succinate 25 MG 24 hr tablet Commonly known as: TOPROL -XL Take one tablet (25 mg dose) by mouth 2 (two) times daily. Please, hold Metoprolol  if systolic BP is less than 105 or HR is less than 60.   Systane 0.4-0.3 % Gel ophthalmic gel Generic drug: Polyethyl Glycol-Propyl Glycol Place 1 Application into both eyes as needed.   tamsulosin  0.4 MG Caps capsule Commonly known as: FLOMAX  Take 1 capsule (0.4  mg total) by mouth daily.   timolol  0.5 % ophthalmic solution Commonly known as: TIMOPTIC  Place 1 drop into both eyes 2 (two) times daily.       No Known Allergies    The results of significant diagnostics from this hospitalization (including imaging, microbiology, ancillary and laboratory) are listed below for reference.    Significant Diagnostic Studies: ECHOCARDIOGRAM COMPLETE Result Date: 03/05/2024    ECHOCARDIOGRAM REPORT   Patient Name:   JERMAYNE SWEENEY [facility] Date of Exam: 03/05/2024 Medical Rec #:  [id number]                   Height:       66.0 in Accession #:    [id number]                  Weight:       179.3 lb Date of Birth:  27-Mar-1946                    BSA:          1.909 m Patient Age:    78 years                    BP:           158/83 mmHg Patient Gender: M                           HR:           65 bpm. Exam Location:  Inpatient Procedure: 2D Echo, Cardiac Doppler and Color Doppler (Both Spectral and Color            Flow Doppler were utilized during procedure). Indications:    Dyspnea  History:        Patient has no prior history of Echocardiogram examinations.                 CAD, COPD, Arrythmias:Atrial Fibrillation; Risk                 Factors:Hypertension. H/O Hyperlipidemia.  Sonographer:    BERNARDA ROCKS Referring Phys: [place].Bigness STEVEN J NEWTON IMPRESSIONS  1. Left ventricular ejection fraction, by estimation, is 60 to 65%. The left ventricle has normal function. The left ventricle has no regional wall motion abnormalities. Left ventricular diastolic parameters are indeterminate.  2. Right ventricular systolic function is normal. The right ventricular size is mildly enlarged. There is moderately elevated pulmonary artery systolic pressure. The estimated right ventricular systolic pressure is 45.5 mmHg.  3. Left atrial size was mildly  dilated.  4. Right atrial size was mild to moderately dilated.  5. The mitral valve is normal in structure. Mild mitral valve regurgitation.  No evidence of mitral stenosis.  6. The aortic valve is tricuspid. There is moderate calcification of the aortic valve. Aortic valve regurgitation is not visualized. Aortic valve sclerosis/calcification is present, without any evidence of aortic stenosis.  7. The inferior vena cava is dilated in size with >50% respiratory variability, suggesting right atrial pressure of 8 mmHg. FINDINGS  Left Ventricle: Left ventricular ejection fraction, by estimation, is 60 to 65%. The left ventricle has normal function. The left ventricle has no regional wall motion abnormalities. The left ventricular internal cavity size was normal in size. There is  no left ventricular hypertrophy. Left ventricular diastolic parameters are indeterminate. Right Ventricle: The right ventricular size is mildly enlarged. No increase in right ventricular wall thickness. Right ventricular systolic function is normal. There is moderately elevated pulmonary artery systolic pressure. The tricuspid regurgitant velocity is 3.06 m/s, and with an assumed right atrial pressure of 8 mmHg, the estimated right ventricular systolic pressure is 45.5 mmHg. Left Atrium: Left atrial size was mildly dilated. Right Atrium: Right atrial size was mild to moderately dilated. Pericardium: There is no evidence of pericardial effusion. Mitral Valve: The mitral valve is normal in structure. Mild mitral valve regurgitation. No evidence of mitral valve stenosis. MV peak gradient, 8.8 mmHg. The mean mitral valve gradient is 3.0 mmHg. Tricuspid Valve: The tricuspid valve is normal in structure. Tricuspid valve regurgitation is mild . No evidence of tricuspid stenosis. Aortic Valve: The aortic valve is tricuspid. There is moderate calcification of the aortic valve. Aortic valve regurgitation is not visualized. Aortic valve sclerosis/calcification is present, without any evidence of aortic stenosis. Aortic valve mean gradient measures 5.0 mmHg. Aortic valve peak gradient measures  10.6 mmHg. Aortic valve area, by VTI measures 2.86 cm. Pulmonic Valve: The pulmonic valve was not well visualized. Pulmonic valve regurgitation is not visualized. No evidence of pulmonic stenosis. Aorta: The aortic root is normal in size and structure. Venous: The inferior vena cava is dilated in size with greater than 50% respiratory variability, suggesting right atrial pressure of 8 mmHg. IAS/Shunts: No atrial level shunt detected by color flow Doppler.  LEFT VENTRICLE PLAX 2D LVIDd:         4.60 cm      Diastology LVIDs:         3.30 cm      LV e' medial:    10.00 cm/s LV PW:         1.00 cm      LV E/e' medial:  14.1 LV IVS:        1.00 cm      LV e' lateral:   10.20 cm/s LVOT diam:     2.30 cm      LV E/e' lateral: 13.8 LV SV:         101 LV SV Index:   53 LVOT Area:     4.15 cm  LV Volumes (MOD) LV vol d, MOD A2C: 151.0 ml LV vol d, MOD A4C: 146.0 ml LV vol s, MOD A2C: 63.9 ml LV vol s, MOD A4C: 64.4 ml LV SV MOD A2C:     87.1 ml LV SV MOD A4C:     146.0 ml LV SV MOD BP:      82.9 ml RIGHT VENTRICLE             IVC RV Basal diam:  4.60 cm     IVC diam: 2.50 cm RV S prime:     15.70 cm/s TAPSE (M-mode): 2.0 cm RVSP:           45.5 mmHg LEFT ATRIUM             Index        RIGHT ATRIUM           Index LA diam:        4.30 cm 2.25 cm/m   RA Pressure: 8.00 mmHg LA Vol (A2C):   63.1 ml 33.05 ml/m  RA Area:     22.90 cm LA Vol (A4C):   52.4 ml 27.45 ml/m  RA Volume:   69.40 ml  36.35 ml/m LA Biplane Vol: 58.4 ml 30.59 ml/m  AORTIC VALVE                     PULMONIC VALVE AV Area (Vmax):    2.98 cm      PV Vmax:          0.81 m/s AV Area (Vmean):   2.81 cm      PV Peak grad:     2.6 mmHg AV Area (VTI):     2.86 cm      PR End Diast Vel: 0.90 msec AV Vmax:           163.00 cm/s AV Vmean:          101.000 cm/s AV VTI:            0.355 m AV Peak Grad:      10.6 mmHg AV Mean Grad:      5.0 mmHg LVOT Vmax:         117.00 cm/s LVOT Vmean:        68.300 cm/s LVOT VTI:          0.244 m LVOT/AV VTI ratio: 0.69   AORTA Ao Root diam: 3.50 cm Ao Asc diam:  3.50 cm MITRAL VALVE                TRICUSPID VALVE MV Area (PHT): 4.71 cm     TR Peak grad:   37.5 mmHg MV Area VTI:   2.46 cm     TR Vmax:        306.00 cm/s MV Peak grad:  8.8 mmHg     Estimated RAP:  8.00 mmHg MV Mean grad:  3.0 mmHg     RVSP:           45.5 mmHg MV Vmax:       1.48 m/s MV Vmean:      77.4 cm/s    SHUNTS MV Decel Time: 161 msec     Systemic VTI:  0.24 m MR Peak grad: 96.2 mmHg     Systemic Diam: 2.30 cm MR Vmax:      490.50 cm/s MV E velocity: 141.00 cm/s MV A velocity: 62.40 cm/s MV E/A ratio:  2.26 Toribio Fuel MD Electronically signed by Toribio Fuel MD Signature Date/Time: 03/05/2024/12:25:48 PM    Final    DG Chest 2 View Result Date: 03/03/2024 CLINICAL DATA:  sob EXAM: CHEST - 2 VIEW COMPARISON:  Chest x-ray 08/21/2023 FINDINGS: The heart and mediastinal contours are within normal limits. No focal consolidation. Chronic coarsened interstitial markings with no overt pulmonary edema. Small left pleural effusion. Trace right pleural effusion. No pneumothorax. No acute osseous abnormality. IMPRESSION: Small left pleural effusion. Trace right pleural effusion. Electronically Signed  By: Morgane  Naveau M.D.   On: 03/03/2024 18:56    Microbiology: No results found for this or any previous visit (from the past 240 hours).   Labs: Basic Metabolic Panel: Recent Labs  Lab 03/03/24 1516 03/05/24 0246 03/06/24 1601 03/07/24 1122 03/08/24 0529  NA 137 135 138 136 140  K 4.0 4.1 3.3* 3.4* 3.7  CL 104 101 99 97* 99  CO2 25 25 28 30 31   GLUCOSE 109* 144* 109* 89 109*  BUN 17 25* 31* 33* 25*  CREATININE 1.16 1.19 1.32* 1.30* 1.16  CALCIUM  8.5* 8.6* 8.7* 8.4* 8.5*   Liver Function Tests: Recent Labs  Lab 03/05/24 0246 03/06/24 1601 03/08/24 0529  AST 13* 16 11*  ALT 11 14 12   ALKPHOS 52 56 52  BILITOT 0.6 0.6 0.8  PROT 5.8* 6.9 5.7*  ALBUMIN 3.0* 3.7 3.0*   No results for input(s): LIPASE, AMYLASE in the last  168 hours. No results for input(s): AMMONIA in the last 168 hours. CBC: Recent Labs  Lab 03/03/24 1516 03/05/24 0246  WBC 4.0 13.3*  NEUTROABS 2.8  --   HGB 9.3* 9.5*  HCT 30.4* 29.6*  MCV 89.4 85.3  PLT 200 210   Cardiac Enzymes: No results for input(s): CKTOTAL, CKMB, CKMBINDEX, TROPONINI in the last 168 hours. BNP: BNP (last 3 results) Recent Labs    08/21/23 1424 03/03/24 1516  BNP 147.7* 193.6*    ProBNP (last 3 results) No results for input(s): PROBNP in the last 8760 hours.  CBG: No results for input(s): GLUCAP in the last 168 hours.  Signed:  Colen Grimes MD   [facility] 03/08/2024, 9:55 AM

## 2024-03-08 NOTE — Plan of Care (Signed)
  Problem: Health Behavior/Discharge Planning: Goal: Ability to manage health-related needs will improve Outcome: Progressing   Problem: Clinical Measurements: Goal: Will remain free from infection Outcome: Progressing   Problem: Activity: Goal: Risk for activity intolerance will decrease Outcome: Progressing   Problem: Coping: Goal: Level of anxiety will decrease Outcome: Progressing   Problem: Safety: Goal: Ability to remain free from injury will improve Outcome: Progressing   

## 2024-03-09 ENCOUNTER — Other Ambulatory Visit: Payer: Self-pay

## 2024-03-17 ENCOUNTER — Other Ambulatory Visit: Payer: Self-pay

## 2024-03-17 MED ORDER — AMLODIPINE BESYLATE 2.5 MG PO TABS
2.5000 mg | ORAL_TABLET | Freq: Every day | ORAL | 5 refills | Status: AC
  Filled 2024-03-17: qty 30, 30d supply, fill #0
  Filled 2024-04-10: qty 30, 30d supply, fill #1
  Filled 2024-05-11: qty 30, 30d supply, fill #2
  Filled 2024-06-09: qty 30, 30d supply, fill #3

## 2024-03-18 ENCOUNTER — Other Ambulatory Visit: Payer: Self-pay

## 2024-03-18 MED ORDER — ATORVASTATIN CALCIUM 40 MG PO TABS
40.0000 mg | ORAL_TABLET | Freq: Every day | ORAL | 1 refills | Status: AC
  Filled 2024-03-18: qty 90, 90d supply, fill #0

## 2024-03-18 MED ORDER — FINASTERIDE 5 MG PO TABS
5.0000 mg | ORAL_TABLET | Freq: Every day | ORAL | 1 refills | Status: AC
  Filled 2024-03-18: qty 90, 90d supply, fill #0

## 2024-03-18 MED ORDER — FERROUS GLUCONATE 324 (38 FE) MG PO TABS
ORAL_TABLET | ORAL | 1 refills | Status: AC
  Filled 2024-03-18: qty 180, 90d supply, fill #0
  Filled 2024-06-10: qty 180, 90d supply, fill #1

## 2024-03-18 MED ORDER — DICLOFENAC SODIUM 1 % EX GEL
1.0000 | Freq: Two times a day (BID) | CUTANEOUS | 5 refills | Status: AC | PRN
  Filled 2024-03-18: qty 100, 12d supply, fill #0

## 2024-03-18 MED ORDER — OMEPRAZOLE 20 MG PO CPDR
DELAYED_RELEASE_CAPSULE | ORAL | 1 refills | Status: AC
Start: 2024-03-18 — End: ?
  Filled 2024-03-18: qty 90, 90d supply, fill #0

## 2024-03-18 MED ORDER — TAMSULOSIN HCL 0.4 MG PO CAPS
0.4000 mg | ORAL_CAPSULE | Freq: Every day | ORAL | 1 refills | Status: AC
  Filled 2024-03-18 – 2024-03-19 (×2): qty 90, 90d supply, fill #0

## 2024-03-19 ENCOUNTER — Other Ambulatory Visit: Payer: Self-pay

## 2024-03-20 ENCOUNTER — Other Ambulatory Visit: Payer: Self-pay

## 2024-03-26 ENCOUNTER — Other Ambulatory Visit: Payer: Self-pay

## 2024-03-30 ENCOUNTER — Other Ambulatory Visit: Payer: Self-pay

## 2024-03-30 MED ORDER — METOPROLOL SUCCINATE ER 25 MG PO TB24
25.0000 mg | ORAL_TABLET | Freq: Two times a day (BID) | ORAL | 1 refills | Status: AC
  Filled 2024-03-30: qty 180, 90d supply, fill #0
  Filled 2024-06-17: qty 180, 90d supply, fill #1

## 2024-03-31 ENCOUNTER — Other Ambulatory Visit: Payer: Self-pay

## 2024-04-01 ENCOUNTER — Other Ambulatory Visit: Payer: Self-pay

## 2024-04-07 ENCOUNTER — Other Ambulatory Visit: Payer: Self-pay

## 2024-04-08 ENCOUNTER — Other Ambulatory Visit: Payer: Self-pay

## 2024-04-15 ENCOUNTER — Other Ambulatory Visit: Payer: Self-pay

## 2024-04-22 ENCOUNTER — Other Ambulatory Visit: Payer: Self-pay

## 2024-04-22 MED ORDER — BREZTRI AEROSPHERE 160-9-4.8 MCG/ACT IN AERO
2.0000 | INHALATION_SPRAY | Freq: Two times a day (BID) | RESPIRATORY_TRACT | 3 refills | Status: AC
  Filled 2024-04-22: qty 10.7, 30d supply, fill #0

## 2024-05-01 ENCOUNTER — Other Ambulatory Visit: Payer: Self-pay

## 2024-05-11 ENCOUNTER — Other Ambulatory Visit: Payer: Self-pay

## 2024-05-12 ENCOUNTER — Other Ambulatory Visit: Payer: Self-pay

## 2024-05-15 ENCOUNTER — Other Ambulatory Visit: Payer: Self-pay

## 2024-06-04 ENCOUNTER — Other Ambulatory Visit: Payer: Self-pay

## 2024-06-04 MED ORDER — DICLOFENAC SODIUM 1 % EX GEL
1.0000 | Freq: Two times a day (BID) | CUTANEOUS | 5 refills | Status: AC | PRN
  Filled 2024-06-04: qty 100, 12d supply, fill #0

## 2024-06-05 ENCOUNTER — Other Ambulatory Visit: Payer: Self-pay

## 2024-06-09 ENCOUNTER — Other Ambulatory Visit: Payer: Self-pay

## 2024-06-09 MED ORDER — ELIQUIS 5 MG PO TABS
5.0000 mg | ORAL_TABLET | Freq: Two times a day (BID) | ORAL | 5 refills | Status: AC
  Filled 2024-06-09: qty 60, 30d supply, fill #0

## 2024-06-10 ENCOUNTER — Other Ambulatory Visit: Payer: Self-pay

## 2024-06-11 ENCOUNTER — Other Ambulatory Visit: Payer: Self-pay

## 2024-06-17 ENCOUNTER — Other Ambulatory Visit: Payer: Self-pay

## 2024-06-18 ENCOUNTER — Other Ambulatory Visit: Payer: Self-pay

## 2024-06-18 MED ORDER — DORZOLAMIDE HCL 2 % OP SOLN
1.0000 [drp] | Freq: Three times a day (TID) | OPHTHALMIC | 3 refills | Status: AC
  Filled 2024-06-18 (×2): qty 10, 66d supply, fill #0

## 2024-06-18 MED ORDER — TIMOLOL MALEATE 0.5 % OP SOLN
1.0000 [drp] | Freq: Two times a day (BID) | OPHTHALMIC | 3 refills | Status: AC
  Filled 2024-06-18: qty 15, 75d supply, fill #0

## 2024-06-22 ENCOUNTER — Other Ambulatory Visit: Payer: Self-pay

## 2024-06-23 ENCOUNTER — Other Ambulatory Visit: Payer: Self-pay

## 2024-06-24 ENCOUNTER — Other Ambulatory Visit: Payer: Self-pay

## 2024-06-24 MED ORDER — PREDNISONE 10 MG (21) PO TBPK
ORAL_TABLET | ORAL | 0 refills | Status: AC
  Filled 2024-06-24: qty 21, 6d supply, fill #0

## 2024-06-25 ENCOUNTER — Other Ambulatory Visit: Payer: Self-pay
